# Patient Record
Sex: Male | Born: 1940 | Race: White | Hispanic: No | Marital: Single | Smoking: Former smoker
Health system: Southern US, Community
[De-identification: ages and names within clinical notes are randomized; demographics above are authoritative.]

## PROBLEM LIST (undated history)

## (undated) ENCOUNTER — Inpatient Hospital Stay: Admission: EM | Payer: Self-pay | Source: Home / Self Care

## (undated) DIAGNOSIS — N289 Disorder of kidney and ureter, unspecified: Secondary | ICD-10-CM

## (undated) DIAGNOSIS — E785 Hyperlipidemia, unspecified: Secondary | ICD-10-CM

## (undated) DIAGNOSIS — I639 Cerebral infarction, unspecified: Secondary | ICD-10-CM

## (undated) DIAGNOSIS — K759 Inflammatory liver disease, unspecified: Secondary | ICD-10-CM

## (undated) DIAGNOSIS — C73 Malignant neoplasm of thyroid gland: Secondary | ICD-10-CM

## (undated) DIAGNOSIS — I509 Heart failure, unspecified: Secondary | ICD-10-CM

## (undated) DIAGNOSIS — I739 Peripheral vascular disease, unspecified: Secondary | ICD-10-CM

## (undated) DIAGNOSIS — I499 Cardiac arrhythmia, unspecified: Secondary | ICD-10-CM

## (undated) DIAGNOSIS — I219 Acute myocardial infarction, unspecified: Secondary | ICD-10-CM

## (undated) DIAGNOSIS — C801 Malignant (primary) neoplasm, unspecified: Secondary | ICD-10-CM

## (undated) DIAGNOSIS — I10 Essential (primary) hypertension: Secondary | ICD-10-CM

## (undated) DIAGNOSIS — R400 Somnolence: Secondary | ICD-10-CM

## (undated) DIAGNOSIS — I2510 Atherosclerotic heart disease of native coronary artery without angina pectoris: Secondary | ICD-10-CM

## (undated) DIAGNOSIS — J449 Chronic obstructive pulmonary disease, unspecified: Secondary | ICD-10-CM

## (undated) DIAGNOSIS — R011 Cardiac murmur, unspecified: Secondary | ICD-10-CM

## (undated) HISTORY — DX: Acute myocardial infarction, unspecified: I21.9

## (undated) HISTORY — PX: CARDIAC CATHETERIZATION: SHX172

## (undated) HISTORY — DX: Peripheral vascular disease, unspecified: I73.9

## (undated) HISTORY — PX: HEMORRHOID SURGERY: SHX153

## (undated) HISTORY — DX: Somnolence: R40.0

## (undated) HISTORY — PX: TONSILLECTOMY: SUR1361

## (undated) HISTORY — DX: Hyperlipidemia, unspecified: E78.5

## (undated) HISTORY — DX: Cerebral infarction, unspecified: I63.9

## (undated) HISTORY — PX: CORONARY ANGIOPLASTY: SHX604

## (undated) HISTORY — DX: Malignant (primary) neoplasm, unspecified: C80.1

## (undated) HISTORY — DX: Heart failure, unspecified: I50.9

---

## 2002-06-22 DIAGNOSIS — I639 Cerebral infarction, unspecified: Secondary | ICD-10-CM

## 2002-06-22 HISTORY — DX: Cerebral infarction, unspecified: I63.9

## 2002-10-04 ENCOUNTER — Encounter: Payer: Self-pay | Admitting: Cardiology

## 2002-10-04 ENCOUNTER — Encounter: Payer: Self-pay | Admitting: Neurology

## 2002-10-04 ENCOUNTER — Encounter: Payer: Self-pay | Admitting: Emergency Medicine

## 2002-10-04 ENCOUNTER — Inpatient Hospital Stay (HOSPITAL_COMMUNITY): Admission: EM | Admit: 2002-10-04 | Discharge: 2002-10-06 | Payer: Self-pay | Admitting: Emergency Medicine

## 2002-10-18 ENCOUNTER — Encounter: Admission: RE | Admit: 2002-10-18 | Discharge: 2003-01-16 | Payer: Self-pay | Admitting: Neurology

## 2002-11-10 ENCOUNTER — Ambulatory Visit (HOSPITAL_COMMUNITY): Admission: RE | Admit: 2002-11-10 | Discharge: 2002-11-10 | Payer: Self-pay | Admitting: Neurology

## 2003-06-24 ENCOUNTER — Encounter: Payer: Self-pay | Admitting: Emergency Medicine

## 2003-06-24 ENCOUNTER — Inpatient Hospital Stay (HOSPITAL_COMMUNITY): Admission: RE | Admit: 2003-06-24 | Discharge: 2003-06-26 | Payer: Self-pay | Admitting: Internal Medicine

## 2003-11-13 ENCOUNTER — Encounter (INDEPENDENT_AMBULATORY_CARE_PROVIDER_SITE_OTHER): Payer: Self-pay | Admitting: *Deleted

## 2003-11-13 ENCOUNTER — Ambulatory Visit (HOSPITAL_BASED_OUTPATIENT_CLINIC_OR_DEPARTMENT_OTHER): Admission: RE | Admit: 2003-11-13 | Discharge: 2003-11-13 | Payer: Self-pay | Admitting: Plastic Surgery

## 2003-11-13 ENCOUNTER — Ambulatory Visit (HOSPITAL_COMMUNITY): Admission: RE | Admit: 2003-11-13 | Discharge: 2003-11-13 | Payer: Self-pay | Admitting: Plastic Surgery

## 2003-11-26 ENCOUNTER — Emergency Department (HOSPITAL_COMMUNITY): Admission: EM | Admit: 2003-11-26 | Discharge: 2003-11-26 | Payer: Self-pay | Admitting: Emergency Medicine

## 2006-11-23 ENCOUNTER — Ambulatory Visit (HOSPITAL_COMMUNITY): Admission: RE | Admit: 2006-11-23 | Discharge: 2006-11-24 | Payer: Self-pay | Admitting: Orthopedic Surgery

## 2006-11-23 HISTORY — PX: SPINE SURGERY: SHX786

## 2007-01-19 ENCOUNTER — Ambulatory Visit: Payer: Self-pay | Admitting: Vascular Surgery

## 2007-01-24 ENCOUNTER — Ambulatory Visit: Payer: Self-pay | Admitting: Vascular Surgery

## 2007-01-24 ENCOUNTER — Ambulatory Visit (HOSPITAL_COMMUNITY): Admission: RE | Admit: 2007-01-24 | Discharge: 2007-01-24 | Payer: Self-pay | Admitting: Vascular Surgery

## 2007-02-08 ENCOUNTER — Ambulatory Visit: Payer: Self-pay | Admitting: Vascular Surgery

## 2007-08-09 ENCOUNTER — Ambulatory Visit: Payer: Self-pay | Admitting: Vascular Surgery

## 2008-05-08 ENCOUNTER — Ambulatory Visit: Payer: Self-pay | Admitting: Vascular Surgery

## 2009-05-10 ENCOUNTER — Ambulatory Visit (HOSPITAL_COMMUNITY): Admission: RE | Admit: 2009-05-10 | Discharge: 2009-05-10 | Payer: Self-pay | Admitting: Cardiovascular Disease

## 2009-05-23 ENCOUNTER — Ambulatory Visit: Payer: Self-pay | Admitting: Vascular Surgery

## 2009-07-11 ENCOUNTER — Ambulatory Visit: Payer: Self-pay | Admitting: Vascular Surgery

## 2009-11-26 ENCOUNTER — Ambulatory Visit: Payer: Self-pay | Admitting: Vascular Surgery

## 2009-12-12 ENCOUNTER — Ambulatory Visit: Payer: Self-pay | Admitting: Vascular Surgery

## 2010-02-06 ENCOUNTER — Encounter: Admission: RE | Admit: 2010-02-06 | Discharge: 2010-02-06 | Payer: Self-pay | Admitting: Family Medicine

## 2010-06-04 ENCOUNTER — Ambulatory Visit: Payer: Self-pay | Admitting: Vascular Surgery

## 2010-07-07 ENCOUNTER — Ambulatory Visit (HOSPITAL_COMMUNITY)
Admission: RE | Admit: 2010-07-07 | Discharge: 2010-07-07 | Payer: Self-pay | Source: Home / Self Care | Attending: Vascular Surgery | Admitting: Vascular Surgery

## 2010-07-09 LAB — POCT I-STAT, CHEM 8
BUN: 11 mg/dL (ref 6–23)
Calcium, Ion: 1.02 mmol/L — ABNORMAL LOW (ref 1.12–1.32)
Chloride: 99 mEq/L (ref 96–112)
Creatinine, Ser: 1.1 mg/dL (ref 0.4–1.5)
Glucose, Bld: 91 mg/dL (ref 70–99)
HCT: 49 % (ref 39.0–52.0)
Hemoglobin: 16.7 g/dL (ref 13.0–17.0)
Potassium: 4 mEq/L (ref 3.5–5.1)
Sodium: 131 mEq/L — ABNORMAL LOW (ref 135–145)
TCO2: 26 mmol/L (ref 0–100)

## 2010-07-12 ENCOUNTER — Other Ambulatory Visit: Payer: Self-pay | Admitting: Vascular Surgery

## 2010-07-12 DIAGNOSIS — I7409 Other arterial embolism and thrombosis of abdominal aorta: Secondary | ICD-10-CM

## 2010-07-23 ENCOUNTER — Encounter (HOSPITAL_COMMUNITY): Payer: Medicare Other

## 2010-07-23 ENCOUNTER — Encounter (HOSPITAL_COMMUNITY)
Admission: RE | Admit: 2010-07-23 | Discharge: 2010-07-23 | Disposition: A | Discharge status: Home / Self Care | Payer: Medicare Other | Source: Ambulatory Visit | Attending: Vascular Surgery | Admitting: Vascular Surgery

## 2010-07-23 ENCOUNTER — Ambulatory Visit (INDEPENDENT_AMBULATORY_CARE_PROVIDER_SITE_OTHER): Payer: Medicare Other | Admitting: Vascular Surgery

## 2010-07-23 ENCOUNTER — Ambulatory Visit
Admission: RE | Admit: 2010-07-23 | Discharge: 2010-07-23 | Disposition: A | Discharge status: Home / Self Care | Payer: Medicare Other | Source: Ambulatory Visit | Attending: Vascular Surgery | Admitting: Vascular Surgery

## 2010-07-23 ENCOUNTER — Other Ambulatory Visit: Payer: Self-pay | Admitting: Vascular Surgery

## 2010-07-23 ENCOUNTER — Ambulatory Visit: Admit: 2010-07-23 | Discharge: 2010-07-23 | Payer: Self-pay | Attending: Vascular Surgery | Admitting: Vascular Surgery

## 2010-07-23 DIAGNOSIS — I7409 Other arterial embolism and thrombosis of abdominal aorta: Secondary | ICD-10-CM

## 2010-07-23 DIAGNOSIS — I739 Peripheral vascular disease, unspecified: Secondary | ICD-10-CM

## 2010-07-23 DIAGNOSIS — Z01818 Encounter for other preprocedural examination: Secondary | ICD-10-CM | POA: Insufficient documentation

## 2010-07-23 DIAGNOSIS — I70219 Atherosclerosis of native arteries of extremities with intermittent claudication, unspecified extremity: Secondary | ICD-10-CM

## 2010-07-23 HISTORY — PX: BYPASS GRAFT: SHX909

## 2010-07-23 LAB — COMPREHENSIVE METABOLIC PANEL
ALT: 16 U/L (ref 0–53)
AST: 22 U/L (ref 0–37)
Albumin: 3.9 g/dL (ref 3.5–5.2)
Alkaline Phosphatase: 73 U/L (ref 39–117)
BUN: 5 mg/dL — ABNORMAL LOW (ref 6–23)
CO2: 28 mEq/L (ref 19–32)
Calcium: 9.1 mg/dL (ref 8.4–10.5)
Chloride: 93 mEq/L — ABNORMAL LOW (ref 96–112)
Creatinine, Ser: 0.73 mg/dL (ref 0.4–1.5)
GFR calc Af Amer: >60 mL/min (ref >60)
GFR calc non Af Amer: >60 mL/min (ref >60)
Glucose, Bld: 87 mg/dL (ref 70–99)
Potassium: 4.1 mEq/L (ref 3.5–5.1)
Sodium: 128 mEq/L — ABNORMAL LOW (ref 135–145)
Total Bilirubin: 0.6 mg/dL (ref 0.3–1.2)
Total Protein: 7.1 g/dL (ref 6.0–8.3)

## 2010-07-23 LAB — SURGICAL PCR SCREEN
MRSA, PCR: NEGATIVE
Staphylococcus aureus: NEGATIVE

## 2010-07-23 LAB — BLOOD GAS, ARTERIAL
Acid-Base Excess: 2.8 mmol/L — ABNORMAL HIGH (ref 0.0–2.0)
Bicarbonate: 26.6 mEq/L — ABNORMAL HIGH (ref 20.0–24.0)
Drawn by: 289361
FIO2: 0.21 %
O2 Saturation: 95.3 %
Patient temperature: 98.6
TCO2: 27.8 mmol/L (ref 0–100)
pCO2 arterial: 39.3 mmHg (ref 35.0–45.0)
pH, Arterial: 7.445 (ref 7.350–7.450)
pO2, Arterial: 71.9 mmHg — ABNORMAL LOW (ref 80.0–100.0)

## 2010-07-23 LAB — URINALYSIS, ROUTINE W REFLEX MICROSCOPIC
Bilirubin Urine: NEGATIVE
Ketones, ur: NEGATIVE mg/dL
Leukocytes, UA: NEGATIVE
Nitrite: NEGATIVE
Protein, ur: NEGATIVE mg/dL
Specific Gravity, Urine: 1.011 (ref 1.005–1.030)
Urine Glucose, Fasting: NEGATIVE mg/dL
Urobilinogen, UA: 0.2 mg/dL (ref 0.0–1.0)
pH: 7.5 (ref 5.0–8.0)

## 2010-07-23 LAB — URINE MICROSCOPIC-ADD ON

## 2010-07-23 LAB — CBC
HCT: 43.8 % (ref 39.0–52.0)
Hemoglobin: 15.2 g/dL (ref 13.0–17.0)
MCH: 32.5 pg (ref 26.0–34.0)
MCHC: 34.7 g/dL (ref 30.0–36.0)
MCV: 93.6 fL (ref 78.0–100.0)
Platelets: 300 10*3/uL (ref 150–400)
RBC: 4.68 MIL/uL (ref 4.22–5.81)
RDW: 13.3 % (ref 11.5–15.5)
WBC: 10.8 10*3/uL — ABNORMAL HIGH (ref 4.0–10.5)

## 2010-07-23 LAB — PROTIME-INR
INR: 1.11 (ref 0.00–1.49)
Prothrombin Time: 14.5 seconds (ref 11.6–15.2)

## 2010-07-23 LAB — ABO/RH: ABO/RH(D): O POS

## 2010-07-23 LAB — APTT: aPTT: 35 seconds (ref 24–37)

## 2010-07-24 ENCOUNTER — Other Ambulatory Visit: Payer: Self-pay | Admitting: Vascular Surgery

## 2010-07-24 ENCOUNTER — Inpatient Hospital Stay (HOSPITAL_COMMUNITY): Payer: Medicare Other

## 2010-07-24 ENCOUNTER — Inpatient Hospital Stay (HOSPITAL_COMMUNITY)
Admission: RE | Admit: 2010-07-24 | Discharge: 2010-08-01 | DRG: 253 | Disposition: A | Discharge status: Home Health | Payer: Medicare Other | Attending: Vascular Surgery | Admitting: Vascular Surgery

## 2010-07-24 DIAGNOSIS — E871 Hypo-osmolality and hyponatremia: Secondary | ICD-10-CM | POA: Diagnosis not present

## 2010-07-24 DIAGNOSIS — J90 Pleural effusion, not elsewhere classified: Secondary | ICD-10-CM | POA: Diagnosis not present

## 2010-07-24 DIAGNOSIS — D72829 Elevated white blood cell count, unspecified: Secondary | ICD-10-CM | POA: Diagnosis present

## 2010-07-24 DIAGNOSIS — K560 Paralytic ileus: Secondary | ICD-10-CM | POA: Diagnosis not present

## 2010-07-24 DIAGNOSIS — L03818 Cellulitis of other sites: Secondary | ICD-10-CM | POA: Diagnosis not present

## 2010-07-24 DIAGNOSIS — I7409 Other arterial embolism and thrombosis of abdominal aorta: Principal | ICD-10-CM | POA: Diagnosis present

## 2010-07-24 DIAGNOSIS — E785 Hyperlipidemia, unspecified: Secondary | ICD-10-CM | POA: Diagnosis present

## 2010-07-24 DIAGNOSIS — I7092 Chronic total occlusion of artery of the extremities: Secondary | ICD-10-CM

## 2010-07-24 DIAGNOSIS — F17200 Nicotine dependence, unspecified, uncomplicated: Secondary | ICD-10-CM | POA: Diagnosis present

## 2010-07-24 DIAGNOSIS — T8140XA Infection following a procedure, unspecified, initial encounter: Secondary | ICD-10-CM | POA: Diagnosis not present

## 2010-07-24 DIAGNOSIS — Y849 Medical procedure, unspecified as the cause of abnormal reaction of the patient, or of later complication, without mention of misadventure at the time of the procedure: Secondary | ICD-10-CM | POA: Diagnosis not present

## 2010-07-24 DIAGNOSIS — L02818 Cutaneous abscess of other sites: Secondary | ICD-10-CM | POA: Diagnosis not present

## 2010-07-24 DIAGNOSIS — I10 Essential (primary) hypertension: Secondary | ICD-10-CM | POA: Diagnosis present

## 2010-07-24 LAB — CBC
HCT: 36.8 % — ABNORMAL LOW (ref 39.0–52.0)
Hemoglobin: 12.2 g/dL — ABNORMAL LOW (ref 13.0–17.0)
MCH: 31.3 pg (ref 26.0–34.0)
MCHC: 33.2 g/dL (ref 30.0–36.0)
MCV: 94.4 fL (ref 78.0–100.0)
Platelets: 228 10*3/uL (ref 150–400)
RBC: 3.9 MIL/uL — ABNORMAL LOW (ref 4.22–5.81)
RDW: 13.4 % (ref 11.5–15.5)
WBC: 18.7 10*3/uL — ABNORMAL HIGH (ref 4.0–10.5)

## 2010-07-24 LAB — PROTIME-INR
INR: 1.31 (ref 0.00–1.49)
Prothrombin Time: 16.5 seconds — ABNORMAL HIGH (ref 11.6–15.2)

## 2010-07-24 LAB — BASIC METABOLIC PANEL
BUN: 5 mg/dL — ABNORMAL LOW (ref 6–23)
CO2: 26 mEq/L (ref 19–32)
Chloride: 100 mEq/L (ref 96–112)
GFR calc non Af Amer: >60 mL/min (ref >60)
Glucose, Bld: 150 mg/dL — ABNORMAL HIGH (ref 70–99)
Potassium: 4.1 mEq/L (ref 3.5–5.1)
Sodium: 134 mEq/L — ABNORMAL LOW (ref 135–145)

## 2010-07-24 NOTE — Assessment & Plan Note (Signed)
OFFICE VISIT  [name] [name], [name] [name] DOB:  [birth year]                                       07/23/2010 NUUVO#:[id number]  This patient is being admitted tomorrow for aortofemoral bypass grafting.  I have dictated his history and physical on the hospital line.    Di Kindle. Edilia Bo, M.D. Electronically Signed  CSD/MEDQ  D:  07/23/2010  T:  07/24/2010  Job:  [id number]

## 2010-07-25 ENCOUNTER — Inpatient Hospital Stay (HOSPITAL_COMMUNITY): Payer: Medicare Other

## 2010-07-25 LAB — COMPREHENSIVE METABOLIC PANEL
ALT: 13 U/L (ref 0–53)
BUN: 3 mg/dL — ABNORMAL LOW (ref 6–23)
CO2: 27 mEq/L (ref 19–32)
Calcium: 7.9 mg/dL — ABNORMAL LOW (ref 8.4–10.5)
GFR calc non Af Amer: >60 mL/min (ref >60)
Glucose, Bld: 137 mg/dL — ABNORMAL HIGH (ref 70–99)
Sodium: 132 mEq/L — ABNORMAL LOW (ref 135–145)

## 2010-07-25 LAB — CBC
HCT: 38.6 % — ABNORMAL LOW (ref 39.0–52.0)
Hemoglobin: 13.2 g/dL (ref 13.0–17.0)
MCH: 32.4 pg (ref 26.0–34.0)
MCHC: 34.2 g/dL (ref 30.0–36.0)
MCV: 94.6 fL (ref 78.0–100.0)

## 2010-07-26 LAB — TYPE AND SCREEN
ABO/RH(D): O POS
Antibody Screen: NEGATIVE
Unit division: 0
Unit division: 0

## 2010-07-26 LAB — URINALYSIS, ROUTINE W REFLEX MICROSCOPIC
Leukocytes, UA: NEGATIVE
Nitrite: NEGATIVE
Specific Gravity, Urine: 1.016 (ref 1.005–1.030)
pH: 6.5 (ref 5.0–8.0)

## 2010-07-26 LAB — CBC
HCT: 37.5 % — ABNORMAL LOW (ref 39.0–52.0)
Platelets: 204 10*3/uL (ref 150–400)
RBC: 4.03 MIL/uL — ABNORMAL LOW (ref 4.22–5.81)
RDW: 13.4 % (ref 11.5–15.5)
WBC: 25.6 10*3/uL — ABNORMAL HIGH (ref 4.0–10.5)

## 2010-07-26 LAB — BASIC METABOLIC PANEL
GFR calc non Af Amer: >60 mL/min (ref >60)
Glucose, Bld: 172 mg/dL — ABNORMAL HIGH (ref 70–99)
Potassium: 3.6 mEq/L (ref 3.5–5.1)
Sodium: 133 mEq/L — ABNORMAL LOW (ref 135–145)

## 2010-07-26 LAB — URINE MICROSCOPIC-ADD ON

## 2010-07-27 ENCOUNTER — Inpatient Hospital Stay (HOSPITAL_COMMUNITY): Payer: Medicare Other

## 2010-07-27 LAB — BASIC METABOLIC PANEL
Calcium: 7.9 mg/dL — ABNORMAL LOW (ref 8.4–10.5)
Creatinine, Ser: 0.58 mg/dL (ref 0.4–1.5)
GFR calc Af Amer: >60 mL/min (ref >60)

## 2010-07-27 LAB — CBC
MCH: 31.3 pg (ref 26.0–34.0)
Platelets: 197 10*3/uL (ref 150–400)
RBC: 3.77 MIL/uL — ABNORMAL LOW (ref 4.22–5.81)
WBC: 23.7 10*3/uL — ABNORMAL HIGH (ref 4.0–10.5)

## 2010-07-28 LAB — BASIC METABOLIC PANEL
GFR calc non Af Amer: >60 mL/min (ref >60)
Glucose, Bld: 113 mg/dL — ABNORMAL HIGH (ref 70–99)
Potassium: 3.2 mEq/L — ABNORMAL LOW (ref 3.5–5.1)
Sodium: 133 mEq/L — ABNORMAL LOW (ref 135–145)

## 2010-07-28 LAB — URINE CULTURE
Colony Count: NO GROWTH
Culture  Setup Time: 201202050110

## 2010-07-28 LAB — CBC
HCT: 31.7 % — ABNORMAL LOW (ref 39.0–52.0)
Hemoglobin: 10.7 g/dL — ABNORMAL LOW (ref 13.0–17.0)
RDW: 13.4 % (ref 11.5–15.5)
WBC: 16.5 10*3/uL — ABNORMAL HIGH (ref 4.0–10.5)

## 2010-07-29 ENCOUNTER — Inpatient Hospital Stay (HOSPITAL_COMMUNITY): Payer: Medicare Other

## 2010-07-29 LAB — BASIC METABOLIC PANEL
BUN: 5 mg/dL — ABNORMAL LOW (ref 6–23)
Calcium: 7.9 mg/dL — ABNORMAL LOW (ref 8.4–10.5)
Creatinine, Ser: 0.63 mg/dL (ref 0.4–1.5)
GFR calc non Af Amer: >60 mL/min (ref >60)
Potassium: 3.7 mEq/L (ref 3.5–5.1)

## 2010-07-29 LAB — CBC
MCH: 31.5 pg (ref 26.0–34.0)
MCHC: 33.7 g/dL (ref 30.0–36.0)
MCV: 93.5 fL (ref 78.0–100.0)
Platelets: 196 10*3/uL (ref 150–400)
RDW: 13.1 % (ref 11.5–15.5)
WBC: 12.3 10*3/uL — ABNORMAL HIGH (ref 4.0–10.5)

## 2010-07-30 LAB — BASIC METABOLIC PANEL
Calcium: 8.3 mg/dL — ABNORMAL LOW (ref 8.4–10.5)
GFR calc Af Amer: >60 mL/min (ref >60)
GFR calc non Af Amer: >60 mL/min (ref >60)
Potassium: 3.6 mEq/L (ref 3.5–5.1)
Sodium: 138 mEq/L (ref 135–145)

## 2010-07-30 LAB — CBC
MCHC: 33.7 g/dL (ref 30.0–36.0)
Platelets: 196 10*3/uL (ref 150–400)
RDW: 12.9 % (ref 11.5–15.5)
WBC: 12.6 10*3/uL — ABNORMAL HIGH (ref 4.0–10.5)

## 2010-07-31 LAB — BASIC METABOLIC PANEL
CO2: 28 mEq/L (ref 19–32)
GFR calc Af Amer: >60 mL/min (ref >60)
GFR calc non Af Amer: >60 mL/min (ref >60)
Glucose, Bld: 98 mg/dL (ref 70–99)
Potassium: 3.7 mEq/L (ref 3.5–5.1)
Sodium: 134 mEq/L — ABNORMAL LOW (ref 135–145)

## 2010-07-31 LAB — CBC
HCT: 34.6 % — ABNORMAL LOW (ref 39.0–52.0)
Hemoglobin: 11.8 g/dL — ABNORMAL LOW (ref 13.0–17.0)
WBC: 12.6 10*3/uL — ABNORMAL HIGH (ref 4.0–10.5)

## 2010-08-01 LAB — CBC
HCT: 34.9 % — ABNORMAL LOW (ref 39.0–52.0)
Hemoglobin: 11.8 g/dL — ABNORMAL LOW (ref 13.0–17.0)
MCHC: 33.8 g/dL (ref 30.0–36.0)
RBC: 3.77 MIL/uL — ABNORMAL LOW (ref 4.22–5.81)

## 2010-08-04 ENCOUNTER — Other Ambulatory Visit (HOSPITAL_COMMUNITY): Payer: Self-pay | Admitting: Cardiovascular Disease

## 2010-08-04 DIAGNOSIS — I6529 Occlusion and stenosis of unspecified carotid artery: Secondary | ICD-10-CM

## 2010-08-05 NOTE — H&P (Addendum)
NAME:  [name] [name], [name] [name] NO.:  [id number]  MEDICAL RECORD NO.:  [id number]           PATIENT TYPE:  LOCATION:                               FACILITY:  [facility]  PHYSICIAN:  Di Kindle. 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  HISTORY:  This is a pleasant 70 year old gentleman who I have been following some time with peripheral vascular disease.  He has bilateral lower extremity claudication, which has gradually been progressive over several years.  His most recent ABIs in December 2011 showed an ABI 40% on the right and 59% on the left.  He has undergone arteriography, which shows occluded common and external iliac artery on the right with an occluded external iliac artery on the left.  He was not a candidate for endovascular intervention.  Given the progression of his symptoms which have now become disabling, he wished to pursue revascularization and he is admitted for aortofemoral bypass grafting.  He has claudication in both legs that involves his calves and thighs bilaterally.  The symptoms are brought on by ambulation and relieved with rest.  This occurs at less than half a block.  His symptoms are more significant on the right side.  He has also developed some rest pain in his right foot.  He has no history of nonhealing ulcers.  PAST MEDICAL HISTORY:  Significant for hypertension and hypercholesterolemia.  He denies any history of diabetes, history of previous myocardial infarction, history of congestive heart failure or history of COPD.  SOCIAL HISTORY:  He is single.  He is two children.  He smokes a pack per day of cigarettes and has been smoking since he was 70 years old.  ALLERGIES:  The patient is allergic to CECLOR.  MEDICATIONS: 1. Amlodipine 10  mg p.o. daily. 2. Atenolol 50 mg p.o. daily. 3. Gabapentin 600 mg p.o. b.i.d. 4. Pletal 100 mg p.o. b.i.d. 5. Trazodone 150 mg half a tab p.r.n. 6. Vitamin C over-the-counter daily. 7. Crestor 5 mg twice weekly. 8. Losartan 50 mg two daily p.o. 9. Hydralazine 25 mg p.o. t.i.d. 10.Potassium p.o. daily.  REVIEW OF SYSTEMS:  GENERAL:  He has had no recent weight loss, weight gain or problems with his appetite.  CARDIOVASCULAR:  He has had no chest pain, chest pressure, palpitations, or arrhythmias.  He has had a mini-stroke in the past, but cannot remember the details.  He has had no history of DVT or phlebitis.  GI:  He has had occasional diarrhea. NEUROLOGIC:  He had no dizziness, blackouts, headaches, or seizures. PULMONARY:  He has had no productive cough, bronchitis, asthma, or wheezing.  GU:  He has some urinary frequency at times.  ENT:  He has had some gradual change in his eyesight and hearing.  Hematologic, musculoskeletal, psychiatric, integumentary review of systems is unremarkable.  PHYSICAL EXAMINATION:  GENERAL:  This is a pleasant 70 year old gentleman who appears his stated age. VITAL SIGNS:  Blood pressure is 130/63, heart  rate is 80, temperature is 97.9. HEENT:  Unremarkable. LUNGS:  Clear bilaterally to auscultation without rales, rhonchi, or wheezing. CARDIOVASCULAR:  He has a regular rate and rhythm.  I cannot palpate popliteal or pedal pulses on either side. ABDOMEN:  Soft and nontender with normal-pitched bowel sounds. MUSCULOSKELETAL:  There is no major deformities or cyanosis. NEUROLOGIC:  He has no focal weakness or paresthesias. SKIN:  There are no ulcers or rashes.  I have reviewed his arteriogram, which shows an occlusion of the right common and external iliac arteries with reconstitution at the femoral bifurcation.  On the left side, he has an occluded external iliac artery.  He has a markedly calcific aorta with a large IMA that is patent.  There  is a calcific plaque of the suprarenal, which does not appear to be flow-limiting.  I have also obtain the CT scan which shows a markedly calcified aorta throughout.  His most recent ABIs showed ABI 40% on the right and 59% on the left.  I have had a long discussion with the patient about the options for revascularization.  I have explained that I think as long as we can safely clamp his aorta, aortobifemoral bypass grafting would be the best approach in terms of long-term patency and relief of symptoms.  I have explained, however, given the markedly calcific aorta if I feel that it is not safe to occlude the aorta, we potentially have to convert to an axillofemoral bypass graft.  Of note, he did have an arch aortogram, which showed the left subclavian is widely patent.  We have discussed the indications for surgery and the potential complications including, but not limited to bleeding, injury to the aorta from the diffuse calcific disease, renal insufficiency, wound healing problems, graft infection, MI, or other unpredictable medical problems.  All of his questions were answered and he is agreeable to proceed.  Of note, we have also discussed the importance of tobacco cessation.  In addition, he has been evaluated by Dr. Allyson Sabal and is felt to be an acceptable cardiovascular risk for surgery.  According to Dr. Hazle Coca note, he had a negative Myoview.  Of note, Dr. Allyson Sabal been following his carotid arteries.     Di Kindle. Edilia Bo, M.D.     CSD/MEDQ  D:  07/23/2010  T:  07/24/2010  Job:  [id number]  cc:   Nanetta Batty, M.D. Windle Guard, M.D.  Electronically Signed by Waverly Ferrari M.D. on 08/19/2010 05:51:21 PM

## 2010-08-05 NOTE — Op Note (Signed)
NAME:  [name] [name], [name] [name] NO.:  [id number]  MEDICAL RECORD NO.:  [id number]           PATIENT TYPE:  I  LOCATION:  2308                         FACILITY:  [facility]  PHYSICIAN:  Di Kindle. 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  PROCEDURES: 1. Exploration of the infrarenal aorta. 2. Left axillobifemoral bypass graft.  SURGEON:  Di Kindle. Edilia Bo, MD  ASSISTANTS: 1. Fransisco Hertz, MD 2. Mont Dutton, RNFA.  ANESTHESIA:  General.  FINDINGS: 1. Porcelain aorta. 2. Diffuse multilevel arterial occlusive disease.  INDICATIONS:  This is a 70 year old gentleman who I have been following with multilevel arterial occlusive disease, mostly diffuse aortoiliac occlusive disease.  He had an occlusion of the right common and external iliac arteries on the right and occlusion of the left external iliac artery on the left.  He also had significant disease of the common femoral arteries bilaterally and infrainguinal arterial occlusive disease.  He was not a candidate for an endovascular approach.  His symptoms progressed and he was developing rest pain in the right foot. Given the progression of his symptoms, he wished to pursue revascularization.  Of note, I was concerned about the extent of calcific disease in his aorta which was confirmed with CT scan.  He had circumferential calcific disease.  He also had a large eccentric calcific plaque just above the left renal artery, although by angio this did not appear to be flow-limiting.  My plan was aortofemoral bypass grafting assuming we could clamp the aorta and did discuss with family preoperatively that if this was not  technically possible, then the our next option would be left axillobifemoral bypass graft.  I had performed an arteriogram that showed a left subclavian artery was widely patent.  TECHNIQUE:  The patient was taken to the operating room and received a general anesthetic.  The abdomen, lower groins, left infraclavicular area, and left chest were prepped and draped in the usual sterile fashion.  I had prepped for potential axillofemoral bypass grafting if in fact the aorta could not be clamped.  The right groin was exposed through a longitudinal incision and the common femoral, superficial femoral, and deep femoral arteries were controlled.  Of note, the deep femoral artery could be clamped distally where it was soft.  The superficial femoral artery was soft enough to clamp.  There was diffuse calcific disease proximally, but one area where it appears that I could get a clamp for proximal control.  Multiple branches were also controlled with red loops.  On the left side, a longitudinal incision was made and the common femoral, superficial femoral, and deep femoral arteries were controlled.  The superficial femoral artery was diffusely calcified and could not be clamped.  The deep femoral artery was soft enough that it could be clamped and again fairly high up on the external iliac artery that was an area that I could safely  clamp for proximal control on the left.  Multiple branches also were controlled with red vessel loops.  Next, the abdomen was entered through a midline incision. The transverse colon was reflected superiorly and the small bowel reflected to the right.  The retroperitoneal tissue was divided and the infrarenal aorta exposed from the level of the renal vein down to the large patent inferior mesenteric artery.  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 There was no area of the aorta below the renals that could even technically be clamped.  In addition, the suprarenal clamp was not  an option given the large eccentric plaque located just above the renals seen on arteriogram and also on CT scan.  For this reason, it was not technically feasible to clamp the aorta and therefore we elected to proceed with the axillofemoral bypass grafting.  The retroperitoneal tissue was closed with running 2-0 Vicryl.  The abdominal contents were returned in the normal position.  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  The fascial layer was closed with two #1 PDS sutures. The skin was closed with 4-0 subcuticular stitch.  Dermabond was applied.  Next, attention was turned to exposure of the left axillary artery.  An infraclavicular incision was made on the left.  The two heads of pec major were divided and the clavipectoral fascia divided. The artery was then mobilized superior to the vein, it was a soft artery and had excellent pulse and this was controlled for proximal anastomosis.  Next, an 8-mm PTFE Intering graft was tunneled from the groin incision to the axillary incision.  In an inverted U fashion, another graft was tunneled from the right groin to the left groin.  This was an 8-mm Intering Gore-Tex graft.  The patient was then heparinized. Attention was first turned to the right femoral anastomosis.  The deep femoral artery and superficial femoral artery were clamped.  An external iliac artery was clamped proximally and then a longitudinal arteriotomy made in one small area that had a soft spot along the anterior aspect of the common femoral artery.  It otherwise was diffuse calcific disease. The right limb of the graft for the fem-fem graft was spatulated and sewn end-to-side to the common femoral artery using continuous 6-0 Prolene suture.  Prior to completing this anastomosis, the graft was clamped, the arteries were back-bled and flushed, and the anastomosis completed.  The graft was then  reported to be appropriate length for anastomosis to the left femoral artery.  The deep femoral artery was clamped distally.  The external iliac artery was clamped proximally. The superficial femoral artery could not be clamped and therefore I elected to control this with a Fogarty catheter.  A longitudinal arteriotomy was made in the common femoral artery along the anterior aspect fairly high up on the common femoral artery as this was the only soft spot.  The left limb of the fem-fem graft was then spatulated, cut to appropriate length, and sewn end-to-side to the left common femoral artery using continuous 6-0 suture.  These arteries were also back-bled and flushed before completing the anastomosis.  The clamps were then released.  Next, attention was turned to the axilla-fem graft.  The axillary artery was clamped proximally and distally and a longitudinal arteriotomy was made.  The 8-mm PTFE graft was spatulated.  A longitudinal arteriotomy was made in the axillary artery and the graft sewn end-to-side to the axillary artery using a running 5-0  PTFE suture. Of note, I did divide the pec minor and the graft was tunneled deep to this level.  An effort slack was left in the graft to form the mobility of the arm.  The graft was then reported to be appropriate length for anastomosis to the fem-fem graft.  The fem-fem graft was clamped proximally and distally and then a longitudinal graftotomy made in the fem-fem graft.  The axillofemoral graft was then sewn end-to-side to the fem-fem graft using continuous 6-0 PTFE suture.  At the completion, there was excellent flow in the deep femoral and superficial femoral artery bilaterally which was graft dependent.  Hemostasis was obtained in the wounds.  The heparin was partially reversed with protamine.  The infraclavicular incision on the left was closed with a deep layer of 3-0 Vicryl.  The skin was closed with a 4-0 subcuticular stitch.   Dermabond was applied.  Hemostasis was obtained in both groins and both groins were closed with a deep layer of 2-0 Vicryl.  Subcutaneous layer of 2-0 Vicryl.  Skin closed with a 4-0 subcuticular stitch. Sterile dressing was applied.  The patient tolerated the procedure well and the patient had good posterior tibial signal with Doppler postoperatively.  All needle and sponge counts were correct.     Di Kindle. Edilia Bo, M.D.     CSD/MEDQ  D:  07/24/2010  T:  07/25/2010  Job:  [id number]  cc:   Windle Guard, M.D. Nanetta Batty, M.D.  Electronically Signed by Waverly Ferrari M.D. on 07/30/2010 02:09:45 PM

## 2010-08-11 NOTE — Discharge Summary (Signed)
NAME:  [name] [name], [name] [name] NO.:  [id number]  MEDICAL RECORD NO.:  [id number]           PATIENT TYPE:  I  LOCATION:  2038                         FACILITY:  [facility]  PHYSICIAN:  Di Kindle. Edilia Bo, M.D.DATE OF BIRTH:  04/25/1941  DATE OF ADMISSION:  07/24/2010 DATE OF DISCHARGE:                              DISCHARGE SUMMARY   ADMIT DIAGNOSIS:  Aortoiliac occlusive disease.  PAST MEDICAL HISTORY: 1. 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 2. Tobacco abuse. 3. Hypertension. 4. Hyperlipidemia. 5. History of diverticulitis. 6. Postoperative mild ileus, resolved. 7. Postoperative cellulitis, resolved.  DISCHARGE DIAGNOSES: 1. 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 2. Tobacco abuse. 3. Hypertension. 4. Hyperlipidemia. 5. History of diverticulitis. 6. Postoperative mild ileus, resolved. 7. Postoperative cellulitis, resolved.  BRIEF HISTORY:  The patient is a 70 year old Caucasian male who has been followed by Dr. Edilia Bo for multilevel arterial occlusive disease, most diffusely aortoiliac occlusive disease.  The patient was not a candidate for endovascular approach secondary to the significance of his disease. The patient's symptoms progressed and he was developing rest pain in the right foot.  Given progression of symptoms, the patient wished to proceed with revascularization.  Dr. Edilia Bo had been concerned about the extent of the of disease in his aorta, therefore, the patient was prepared for the fact that he may have an aortobifemoral bypass or an axillobifemoral bypass grafting depending on the findings intraoperatively.  HOSPITAL COURSE:  The patient was admitted and taken to the OR on July 24, 2010, for exploration of the infrarenal aorta and a left axillobifemoral bypass graft.  The patient tolerated the procedure well and was stable immediately postoperatively.   The patient was transferred from the OR to the postanesthesia care unit in stable condition.  The patient was extubated without complication and woke up from anesthesia neurologically intact.  The patient's postoperative course has progressed as expected with the exception of a mild incisional cellulitis that has been treated successfully with Ancef.  He also did have a mild postoperative ileus which is also resolving.  The patient is ambulating well, and his diet has been progressively advanced. He has remained afebrile with stable vital signs throughout the postoperative course.  The patient had a leukocytosis noted on postoperative day #3, therefore chest x-ray was ordered which revealed a small left pleural effusion, pneumoperitoneum, and a possible ileus as previously stated.  The patient's leukocytosis has continued to improve while on Ancef.  On July 29, 2010, postop day #5, the patient is afebrile with stable vital signs.  He feels well and without complaints. He has had a bowel movement and denies nausea or vomiting.  He is tolerating full diet at this point.  On physical exam, the abdomen is soft with active bowel sounds.  The incisions are healing well, and the bilateral lower extremities are warm.  The patient is instructed to continue to ambulate and his diet will be advanced to a regular diet today.  As long as the patient continues to progress in current manner, he will be ready for  discharge tomorrow morning.  LABORATORY DATA:  CBC and BMP on July 29, 2010, white count 12.3, hemoglobin 11.6, hematocrit 34.4, platelets 196,000.  Sodium 134, potassium 3.7, BUN 5, creatinine 0.6.  The patient received written discharge instructions concerning diet, activity, and wound care.  He will follow up with Dr. Edilia Bo in the office in approximately 2 weeks.  The patient will be contacted with the date and time of that appointment.  DISCHARGE MEDICATIONS: 1. Nicotine patch  14 mg q.24 h. Transdermally. 2. Oxycodone/acetaminophen 5/325 mg 1-2 q.4-6 h. p.r.n. pain. 3. Amlodipine 10 mg t.i.d. 4. Atenolol 100 mg daily. 5. Cilostazol 100 mg b.i.d. 6. Crestor 5 mg every other day. 7. Gabapentin 600 mg b.i.d. 8. Hydralazine 25 mg t.i.d. 9. Losartan 50 mg b.i.d. 10.Potassium gluconate 595 mg daily p.r.n. 11.Trazodone 150 mg at bedtime p.r.n. 12.Vitamin C 1000 mg OTC 1 tablet daily.     Pecola Leisure, PA   ______________________________ Di Kindle. Edilia Bo, M.D.    AY/MEDQ  D:  07/29/2010  T:  07/29/2010  Job:  [id number]  Electronically Signed by Pecola Leisure PA on 08/06/2010 02:19:39 PM Electronically Signed by Waverly Ferrari M.D. on 08/11/2010 07:54:01 AM

## 2010-08-20 ENCOUNTER — Ambulatory Visit (INDEPENDENT_AMBULATORY_CARE_PROVIDER_SITE_OTHER): Payer: Medicare Other | Admitting: Vascular Surgery

## 2010-08-20 DIAGNOSIS — I70219 Atherosclerosis of native arteries of extremities with intermittent claudication, unspecified extremity: Secondary | ICD-10-CM

## 2010-08-21 NOTE — Assessment & Plan Note (Signed)
OFFICE VISIT  [name] [name], [name] [name] DOB:  01-09-1941                                       08/20/2010 ZOXWR#:[id number]  I saw the patient in the office today for follow-up after recent left axillobifemoral bypass grafting.  This is a pleasant 70 year old gentleman who I have been following with multilevel arterial occlusive disease and diffuse aortoiliac occlusive disease.  His symptoms progressed significantly and he wished to pursue revascularization.  He had some circumferential calcific disease of his aorta.  We explored his aorta, but there was no way to clamp.  He had a porcelain aorta. Therefore he underwent left axillobifemoral bypass grafting.  He did well postoperatively except for some postoperative ileus which resolved. He comes in for his first outpatient visit.  Overall he has been doing well.  He has no claudication, rest pain or nonhealing ulcers.  He has had no problems with nausea or vomiting, and he is tolerating his diet and his bowels are functioning normally.  PHYSICAL EXAMINATION:  This a pleasant 70 year old gentleman who appears his stated age.  Temperature is 97.8, blood pressure 161/71, heart rate is 72.  Lungs:  Clear bilaterally to auscultation.  Incisions are all healed nicely.  He has warm and well-perfused feet.  Abdomen:  Soft and nontender with normal pitched bowel sounds.  Overall I am pleased with his progress.  Of note, he is scheduled to have a carotid duplex scan by Dr. Hazle Coca office who has been following his carotids.  In addition, he has scheduled a duplex of his renal arteries.  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  I do not see any evidence of significant renal artery stenosis. He does have a large calcific plaque just above his left renal artery, but this does not appear to affect the perfusion to the left  kidney.  Of note, I did see a note that he potentially had a chest x-ray which showed a 4-mm nodule in the right lower lobe; however, I reviewed his x- rays on the [facility] system, and I do not see any x-rays recently which show evidence of a nodule.  Along these same lines, the patient that has not smoked since his surgery.  I plan on seeing him back in 6 months with follow-up ABIS.  He knows to call sooner he has problems.    Di Kindle. Edilia Bo, M.D. Electronically Signed  CSD/MEDQ  D:  08/20/2010  T:  08/21/2010  Job:  [id number]  cc:   Windle Guard, M.D. Nanetta Batty, M.D.

## 2010-09-03 ENCOUNTER — Ambulatory Visit (HOSPITAL_COMMUNITY)
Admission: RE | Admit: 2010-09-03 | Discharge: 2010-09-03 | Disposition: A | Discharge status: Home / Self Care | Payer: Medicare Other | Source: Ambulatory Visit | Attending: Cardiovascular Disease | Admitting: Cardiovascular Disease

## 2010-09-03 ENCOUNTER — Encounter (HOSPITAL_COMMUNITY): Payer: Self-pay

## 2010-09-03 DIAGNOSIS — I700 Atherosclerosis of aorta: Secondary | ICD-10-CM | POA: Insufficient documentation

## 2010-09-03 DIAGNOSIS — I6529 Occlusion and stenosis of unspecified carotid artery: Secondary | ICD-10-CM | POA: Insufficient documentation

## 2010-09-03 DIAGNOSIS — R930 Abnormal findings on diagnostic imaging of skull and head, not elsewhere classified: Secondary | ICD-10-CM | POA: Insufficient documentation

## 2010-09-03 HISTORY — DX: Essential (primary) hypertension: I10

## 2010-09-03 MED ORDER — IOHEXOL 350 MG/ML SOLN
50.0000 mL | Freq: Once | INTRAVENOUS | Status: AC | PRN
  Administered 2010-09-03: 50 mL via INTRAVENOUS

## 2010-11-04 NOTE — Consult Note (Signed)
NEW PATIENT CONSULTATION   [name], [name]  DOB:  02-08-1941                                       01/19/2007  ZOXWR#:[id number]   I saw [name] in the office today in consultation concerning his left  calf claudication.  He was referred by Dr. Darrelyn Hillock.  This is a pleasant  70 year old gentleman who has had a long history of left leg pain.  He  has had pain in his left hip associated with ambulation and also some at  rest and also left calf claudication.  He underwent work-up by Dr.  Darrelyn Hillock and was found to have severe spinal stenosis at L5-S1 with a  large ruptured disc at L5-S1 on the left.  He underwent surgery by Dr.  Darrelyn Hillock and has had an excellent result and his hip pain has completely  resolved.  He now has persistent left calf pain and he is sent for a  vascular consultation.  He states that he has had these symptoms in the  calf for approximately 12 years but over the last year his symptoms have  progressed significantly.  He can now walk about one to two blocks  before experiencing symptoms.  At this point, he is not having hip or  thigh claudication.  He has had no symptoms in the right leg.  He has  had no history of rest pain and no history of nonhealing wounds.   PAST MEDICAL HISTORY:  1. Hypertension.  2. Hypercholesterolemia.  3. He denies any history of diabetes, history of previous myocardial      infarction, history of congestive heart failure or history of COPD.   FAMILY HISTORY:  There is no history of premature cardiovascular  disease.   SOCIAL HISTORY:  He is single.  He has two children.  He smokes one to  one and a half packs per day of cigarettes and has been smoking for 40  years.   REVIEW OF SYSTEMS:  Documented on the medical history form in his chart.   MEDICATIONS:  Documented on the medical history form in his chart.   PHYSICAL EXAMINATION:  VITAL SIGNS:  Blood pressure 185/82, heart rate  61.  NECK:  I did not detect  any carotid bruits.  LUNGS:  Clear to auscultation bilaterally.  CARDIOVASCULAR:  He has a regular rate and rhythm.  ABDOMEN:  Soft and nontender.  I cannot palpate an aneurysm.  PERIPHERAL VASCULAR:  He has palpable right femoral pulse.  I cannot  palpate a left femoral pulse.  He has a palpable popliteal and posterior  tibial pulse on the right.  I could not palpate popliteal or pedal  pulses on the left side.  He has no ischemic ulcers on his feet.   Doppler study in our office today showed monophasic Doppler flow in the  left femoral position and monophasic Doppler signals in the left foot.  On the right side, he had a biphasic femoral and biphasic pedal signals.  ABI on the right was 76% and on the left was 34%.   We have discussed the importance of tobacco cessation and I have also  given him the number for . [facility] Tobacco  Cessation Program.  In addition, we have discussed the importance of a  structured walking program.  However, he feels his symptoms are quite  bothersome and he would like to pursue further work-up.  We have  discussed arteriography.  There is a small chance that we will find an  iliac lesion on the left amenable to angioplasty, although I think he  likely has more diffuse multilevel disease on the left.  We have  discussed the indications for arteriography and angioplasty and the  potential complications including, but not limited to, bleeding,  arterial injury, thrombosis and dissection.  All of his questions were  answered and he is agreeable to proceed on January 24, 2007.  Will make  further recommendations pending the results of this study.   Di Kindle. Edilia Bo, M.D.  Electronically Signed   CSD/MEDQ  D:  01/19/2007  T:  01/20/2007  Job:  [id number]

## 2010-11-04 NOTE — Assessment & Plan Note (Signed)
OFFICE VISIT   [name] [name], [name] [name]  DOB:  1941-03-16                                       07/11/2009  ZOXWR#:[id number]   I saw [name] [name] in the office today for continued followup of his  peripheral vascular disease.  I had last seen him in December 2010.  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  Since I saw him in December his symptoms have progressed  somewhat, and he came in to discuss possible revascularization.  I had  originally seen him in consultation with left calf claudication back in  2008.  At that time he underwent an arteriogram and was noted to have  significantly calcific vessels but patent infrarenal aorta with  bilateral iliac artery occlusive disease.  At that time he decided we  would continue with conservative treatment.  However, he is continuing  to have significant calf claudication bilaterally but more significantly  on the right side where his symptoms have progressed especially since  the summer.  He experiences claudication in both calves brought on by  ambulation and relieved with rest.  There are no other aggravating or  alleviating factors.  He has had no history of rest pain and no history  of nonhealing wounds.   PAST MEDICAL HISTORY:  Significant for hypertension.  He denies any  history of diabetes, history of previous myocardial infarction, history  of congestive heart failure or history of COPD.  He does have mildly  elevated cholesterol and is on Crestor.  In addition, he had a TIA in  2004 associated with some right hand weakness.  He denies any history of  congestive heart failure or COPD.   FAMILY HISTORY:  There is no history of premature cardiovascular  disease.   SOCIAL HISTORY:  He continues to smoke and is down to about half a pack  per day.   REVIEW OF SYSTEMS:  CARDIOVASCULAR:  He has no chest pain, chest  pressure, palpitations or arrhythmias.  He has had  no history of recent  stroke, TIA or amaurosis fugax.  He had no history of DVT or phlebitis.  PULMONARY:  He has had no productive cough, bronchitis, asthma or  wheezing.   PHYSICAL EXAMINATION:  This is a pleasant 70 year old gentleman who  appears his stated age.  Blood pressure 160/62, heart rate is 73.  Temperature is 98.  Lungs:  Clear bilaterally to auscultation without  rales, rhonchi or wheezing.  Cardiovascular:  I do not detect any  carotid bruits.  He has a regular rate and rhythm without murmur  appreciated.  He has palpable radial pulses.  I cannot palpate femoral,  popliteal or pedal pulses on either side.  Abdomen:  Soft and nontender  with normal pitched bowel sounds and no masses appreciated.  Neurologic:  He has no focal weakness or paresthesias.   I reviewed his CT angio from back in November which shows bilateral  iliac artery occlusions and markedly calcific vessels diffusely.  I have  also reviewed his ABIs which showed he had an ABI of 34% on the right  and 57% on the left on 05/23/2009.   We have had a long discussion today in the office about potentially  proceeding with aortofemoral bypass grafting.  I have explained that  certainly we could continue with conservative  treatment with attempts at  tobacco cessation and a structured walking program.  If he wishes to  proceed with surgery, I think he would best be served with aortofemoral  bypass grafting given that he is fairly young and healthy.  We also  discussed possible axillobifemoral bypass grafting, but I have explained  that this typically is reserved for sicker patients, as the patency is  not as good with axillofemoral bypass grafting.  We have discussed the  potential complications associated with aortofemoral bypass grafting  including the risk of graft infection, wound healing problems, bleeding,  myocardial infarction or other unpredictable medical problems.  His  arteries are quite calcified and  that does increase his risk slightly.  He would like to consider this further before deciding whether or not he  wants to proceed with aortofemoral bypass grafting.  If he does, he  tells me that he has been cleared from a cardiac standpoint by Dr.  Allyson Sabal.  If I do not hear from him, I will plan on seeing him back in 6  months with followup ABIs.     Di Kindle. Edilia Bo, M.D.  Electronically Signed   CSD/MEDQ  D:  07/11/2009  T:  07/12/2009  Job:  [id number]   cc:   Nanetta Batty, M.D.  Ronald A. Darrelyn Hillock, M.D.

## 2010-11-04 NOTE — Assessment & Plan Note (Signed)
OFFICE VISIT   [name], [name]  DOB:  [birth year]                                       05/08/2008  GEXBM#:[id number]   I saw the patient in the office today for continued followup of his  bilateral lower extremity claudication.  I have been following him with  bilateral calf claudication.  He has undergone back surgery by Dr.  Darrelyn Hillock and has had an excellent result from this standpoint and is very  pleased with that.  He had undergone an arteriogram which showed diffuse  aortoiliac occlusive disease and he was not felt to be ideal candidate  for endovascular approach.  We discussed possible aortofemoral bypass  grafting, but agreed to continue with conservative treatment for now.  I  last saw him in February of 2009.   Since I saw him last, he continues to have claudication in both calves,  which is more significant on the left side.  This occurs at  approximately 100 yards.  He has had no significant hip or thigh  claudication.  He has had no rest pain.  No history of nonhealing  wounds.  He feels that since February his symptoms have gradually  improved.   SOCIAL HISTORY:  He has cut back to half a pack per day of cigarettes.  He had smoked up to 2 packs per day of cigarettes.   REVIEW OF SYSTEMS:  CARDIAC:  He has had no chest pain, chest pressure,  palpitations or arrhythmias.  PULMONARY:  He has had no bronchitis, asthma or wheezing.   PHYSICAL EXAMINATION:  This is a pleasant 70 year old gentleman who  appears his stated age.  Blood pressure is 159/66, heart rate is 61.  Neck is supple.  There are no carotid bruits.  Lungs are clear  bilaterally to auscultation.  On cardiac exam, he has a regular rate and  rhythm without murmur appreciated.  Abdomen:  Soft and nontender.  Has  diminished femoral pulses and monophasic Doppler signals in both feet.  He has no ischemic ulcers.  He has no significant lower extremity  swelling.   Doppler study in  our office today shows an ABI of 65% on the right,  which is about the same as 69% in February.  On the left ABI is 48%  which is about the same is 44% in February.   Overall, I am pleased with his progress.  He seems to be happy with the  conservative approach and will continue his structured walking program  and continue to work on tobacco cessation.  He enjoys hiking and  hopefully he will find some trails that are more level so that he can  continue this hobby.  I will see him in 1 year.  He knows to call sooner  if he has problems.   Di Kindle. Edilia Bo, M.D.  Electronically Signed   CSD/MEDQ  D:  05/08/2008  T:  05/09/2008  Job:  [id number]   cc:   Windy Fast A. Darrelyn Hillock, M.D.

## 2010-11-04 NOTE — Op Note (Signed)
NAME:  [name] [name], [name] [name] NO.:  [id number]   MEDICAL RECORD NO.:  [id number]          PATIENT TYPE:  INP   LOCATION:  2899                         FACILITY:  [facility]   PHYSICIAN:  Di Kindle. Edilia Bo, M.D.DATE OF BIRTH:  26-Jan-1941   DATE OF PROCEDURE:  01/24/2007  DATE OF DISCHARGE:                               OPERATIVE REPORT   PREOPERATIVE DIAGNOSIS:  Aortoiliac occlusive disease.   POSTOPERATIVE DIAGNOSIS:  Aortoiliac occlusive disease.   PROCEDURE:  1. Aorta aortogram.  2. Bilateral iliac arteriogram.  3. Bilateral lower extremity runoff.   SURGEON:  Di Kindle. Edilia Bo, M.D.   ANESTHESIA:  Local with sedation.   TECHNIQUE:  The patient was taken to the PV lab at [facility] and sedated with  a milligram of Versed and 50 mcg of fentanyl.  Both groins were prepped  and draped in usual sterile fashion.  I thought we might potentially be  able to do a left iliac angioplasty.  I therefore cannulated the left  groin after the skin was anesthetized.  However, I could not pass the  wire through an external iliac artery occlusion.  I placed a 5-French  sheath over the wire and then stuck the right side again after the skin  was anesthetized.  The right common femoral artery was cannulated.  A  guidewire introduced.  I had to use a guide catheter and angled  Glidewire to get up the right side and then a 5-French sheath was  introduced over the wire.  Pigtail catheter was positioned at the L1  vertebral body and flush aortogram obtained.  Oblique iliac projections  were obtained.  Bilateral lower extremity runoff films were obtained  through the sheaths.   FINDINGS:  There area accessory renal arteries bilaterally.  No  significant renal artery stenosis identified.  The infrarenal aorta  appears to be markedly calcified.  There is no focal stenosis.   On the right side there is approximately 50% stenosis of the proximal  right common iliac artery just below the  bifurcation.  The hypogastric  artery on the right is occluded.  There are two other eccentric plaques  noted in the common iliac artery, external iliac artery is patent.  The  common femoral, superficial femoral and deep femoral arteries on the  right are patent.  The popliteal and proximal tibial vessels are all  patent.  There is three-vessel runoff on the right via the anterior  tibial, posterior tibial and peroneal arteries.  There is poor  visualization the tibials distally.   On the left side there is some moderate diffuse disease of the distal  common iliac artery.  There is a dominant hypogastric artery on the  left.  The external iliac artery on the left is occluded proximally.  There is reconstitution distally.  There is a eccentric plaque at the  bifurcation of the common femoral artery.  The superficial femoral  artery is patent with a focal occlusion at the adductor canal.  The deep  femoral artery is patent.  The above-knee and  below-knee popliteal arteries are patent.  There is three-vessel  runoff  on the left via the anterior tibial, posterior tibial and peroneal  arteries.  Again there is poor visual visualization of the tibials  distally.   CONCLUSIONS:  Bilateral significant common iliac artery disease as  described above.      Di Kindle. Edilia Bo, M.D.  Electronically Signed     CSD/MEDQ  D:  01/24/2007  T:  01/24/2007  Job:  [id number]   cc:   Windy Fast A. Darrelyn Hillock, M.D.

## 2010-11-04 NOTE — Op Note (Signed)
NAME:  [name] [name], [name] [name] NO.:  [id number]   MEDICAL RECORD NO.:  [id number]          PATIENT TYPE:  AMB   LOCATION:  DAY                          FACILITY:  [facility]   PHYSICIAN:  Georges Lynch. Gioffre, M.D.DATE OF BIRTH:  12/12/1940   DATE OF PROCEDURE:  11/23/2006  DATE OF DISCHARGE:                               OPERATIVE REPORT   SURGEON:  Georges Lynch. Darrelyn Hillock, M.D.   ASSISTANT:  Marlowe Kays, M.D.   PREOPERATIVE DIAGNOSIS:  1. Foraminal and lateral recess stenosis at L5-S1 on the left.  2. 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  3. Preop, the patient complained of severe pain in his left buttock      and left leg.   POSTOPERATIVE DIAGNOSIS:  1. Foraminal and lateral recess stenosis at L5-S1 on the left.  2. 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  3. Preop, the patient complained of severe pain in his left buttock      and left leg.   OPERATION:  1. Decompression for spinal stenosis L5-S1.  2. Microdiscectomy L5-S1 on the left.   PROCEDURE:  Under general anesthesia, routine orthopedic prep and drape  of the lower back was carried out.  Two needles were placed in the back  for localization purposes.  An x-ray was taken.  The x-ray revealed that  we were definitely in the L5-S1 space.  Following this, an incision was  made over L5-S1.  Bleeders were identified and cauterized.  The muscle  was stripped from the lamina and spinous process in the usual fashion.  Self-retaining retractors were inserted and a second x-ray was taken to  verify our exact position.  At this time, I carried out a  hemilaminectomy at L5-S1 on the left.  His disc space was collapsed. He  had severe stenosis.  I decompressed the lateral recess.  I did a nice  foraminotomy to free up the S1 root.  Following that, I went down and  made a cruciate incision in the posterior longitudinal ligament with  great care taken to protect the dura.  I did utilize the microscope at  this time.  I  then went down in the disc space and carried out a  microdiscectomy.  I utilized the [employer] as well as a nerve hooks  to tease out the disc material that was herniated subligamentous.  I  went down into the axilla of the root, as well, and checked that out and  there were no fragments noted there.  We did a nice foraminotomy,  explored the root, the root now was totally free.  The lateral recess  now was free.  I cauterized the lateral recess veins.  I went up  proximally with a hockey stick and made sure there were no other disc  fragments.  I thoroughly irrigated out the area.  I loosely applied a  few pieces of thrombin soaked Gelfoam.  I closed the wound in layers in  the usual fashion except I left a small portion of the deep portion of  the wound open proximally for drainage  purposes.  The skin was closed with metal staples.  I injected about 14  mL of 0.5% Marcaine and epinephrine into the muscle.  The skin was  closed with metal staples.  A sterile Neosporin dressing was applied.  The patient had 30 mg of IV Toradol prior to leaving the operating room.  He had 500 mg of clindamycin preop.           ______________________________  Georges Lynch. Darrelyn Hillock, M.D.     RAG/MEDQ  D:  11/23/2006  T:  11/23/2006  Job:  [id number]

## 2010-11-04 NOTE — Assessment & Plan Note (Signed)
OFFICE VISIT   [name] [name], [name] [name]  DOB:  09/15/1940                                       05/23/2009  XBMWU#:[id number]   I saw the patient in the office today for continued followup of his  peripheral vascular disease.  I had originally seen him in consultation  concerning his left calf claudication back in July of 2008.  He was  referred by Dr. Darrelyn Hillock.  In August of 2008 he underwent an aortogram  which demonstrated a markedly calcified infrarenal aorta with no focal  stenosis.  He had bilateral left iliac disease.  He had mild  infrainguinal arterial occlusive disease.  His symptoms were quite  stable and I have been following his claudication.  He was last seen on  05/08/2008.  At that time ankle brachial index on the right was 65% and on the left  48%.  He seemed to be happy with a conservative approach and we had  continued to stress the importance of tobacco cessation and stressed the  importance of remaining on a structured walking program.  I planned on  seeing him back in 1 year.   Since I saw him last he states that about 3 months ago while working in  the garden he developed sudden pain in the right foot which was  associated with paresthesias of the right foot.  These symptoms remained  fairly stable and he was seen at [facility].  He was subsequently  referred to Dr. Allyson Sabal and was set up to have a CT angiogram which  demonstrates multilevel arterial occlusive disease.  He has continued to  have some stable claudication in the right calf with some mild  claudication in the right thigh.  He has had no significant symptoms on  the left.  He denies any history of rest pain or any history of  nonhealing ulcers.   PAST MEDICAL HISTORY:  Significant for hypertension.  In addition, he  had a TIA in 2004 associated with some right hand weakness and right  facial symptoms.  He has had no subsequent history of stroke, TIAs,  expressive or receptive  aphasia or amaurosis fugax.   He denies any history of diabetes, hypercholesterolemia, history of  previous myocardial infarction, history of congestive heart failure or  history of COPD.   FAMILY HISTORY:  There is no history of premature cardiovascular  disease.   SOCIAL HISTORY:  He is single and he has 2 children.  He smokes less  than a pack per day of cigarettes.  He has been smoking since he was 70  years old.  He has 1-2 drinks per day.   MEDICATIONS:  Are documented on the medical history form in his chart.   ALLERGIES:  He is allergic to Ceclor.   REVIEW OF SYSTEMS:  CARDIOVASCULAR:  He has had no chest pain, chest  pressure, palpitations or arrhythmias.  He has had no significant  orthopnea or dyspnea on exertion.  He has right calf claudication.  He  has had no history of amaurosis fugax.  No history of DVT or phlebitis.  MUSCULOSKELETAL:  He has some mild arthritic pain and occasional muscle  pain in the calf and thigh on the right.  He has had some loss of  hearing recently.  General, pulmonary, GU, neurologic, psychiatric, hematologic and  integumentary review of  systems is unremarkable.  GI:  He does have occasional diarrhea.   PHYSICAL EXAMINATION:  General:  This is a pleasant 70 year old  gentleman who appears his stated age.  Vital signs:  His blood pressure  is 164/69, heart rate is 76, temperature 97.9.  Cardiovascular:  He has  a soft left carotid bruit.  He has a regular rate and rhythm without  murmur or gallop appreciated.  He has absent femoral pulses.  I cannot  palpate popliteal or pedal pulses on either side.  He has a palpable  radial pulse bilaterally.  He has no significant lower extremity  swelling.  Abdomen:  Soft and nontender.  I cannot appreciate an  aneurysm.  He has normal pitched bowel sounds.  Musculoskeletal:  There  are no major deformities or cyanosis.  Neurological:  He has no focal  weakness or paresthesias.  Lymphatic:  He has no  significant cervical,  axillary or inguinal lymphadenopathy.   I have independently interpreted his arterial Doppler study today which  shows monophasic Doppler signals in the posterior tibial position  bilaterally.  Anterior tibial signal cannot be detected on the right.  He has a monophasic anterior tibial signal on the left.  ABI on the  right is 34% down from 65% in 04/2008.  ABI on the left is 57% which has  actually increased slightly.   I have reviewed his CT angiogram which does show diffuse multilevel  arterial occlusive disease.  Most importantly he has a complete  occlusion of the right common iliac artery and external iliac artery on  the right.  He has a short segment occlusion of the left external iliac  artery over shorts.   The patient reportedly had a carotid duplex scan at Dr. Hazle Coca office  which showed significant carotid disease and it sounds like he is  scheduled for cerebral arteriography in addition to cardiac  catheterization and aortogram.  I do not have the results of his carotid  duplex scan but he is going to try to obtain those results.  I have  explained that from my perspective if he is asymptomatic and the  stenosis is less than 80% typically we would not recommend carotid  endarterectomy.  If he developed new neurologic symptoms or his carotid  stenosis progressed to greater than 80% typically we would recommend  carotid endarterectomy.   With respect to his aortoiliac occlusive disease currently his symptoms  are quite tolerable but if the symptoms progressed he would likely  require aortofemoral bypass grafting unless he is a candidate for an  endovascular approach which I believe Dr. Allyson Sabal will determine when he  is studied on 12/10.  I will be out of town that day but will review his  films when I return and will be happy to assist if he requires any  surgical intervention.  Otherwise I plan on seeing him back in 6 months.  Again we have  discussed the importance of tobacco cessation.   Di Kindle. Edilia Bo, M.D.  Electronically Signed   CSD/MEDQ  D:  05/23/2009  T:  05/24/2009  Job:  [id number]   cc:   Windy Fast A. Darrelyn Hillock, M.D.  Nanetta Batty, M.D.

## 2010-11-04 NOTE — Assessment & Plan Note (Signed)
OFFICE VISIT   JAIMES, ECKERT  DOB:  [birth year]                                       02/08/2007  AVWUJ#:[id number]   I saw the patient in the office today for continued followup of his  peripheral vascular disease. This is a pleasant 70 year old gentleman  with a long history of bilateral leg pain. He was found to have a severe  spinal stenosis at L5-S1 with a ruptured disc at L5-S1 and underwent  surgery by Dr.  Darrelyn Hillock and has had an excellent result. He was left  vascular claudication especially on the left side. When I had seen him  in consultation on January 19, 2007, we recommended arteriography and he  underwent an arteriogram on January 24, 2007. This showed diffuse aortic  iliac occlusive disease and he was not a good candidate for endovascular  approach. He came in today to discuss these results.   He states that he has begun cilostazol and this actually has helped his  symptoms somewhat. He has also been taking Chantix and hopes to quit  smoking shortly.   PHYSICAL EXAMINATION:  Blood pressure 156/76, heart rate 60. He has  diminished femoral pulses with no hematoma. His feet are warm and  adequately perfused.   I have discussed the options with him today. We have discussed tobacco  cessation, a structured walking program and use of cilostazol as his non-  operative approach. If this were unsuccessful, I think the best option  would be aortofemoral bypass grafting. Currently, he feels that his  symptoms have actually improved already and he is willing to try a non-  operative approach. He is on cilostazol and as I said he is about to  quit smoking. His quit date is in two days. He seems fairly motivated  and enjoys walking and I think this will work well for him. If it does  not, I think  the best surgical option would be aortofemoral bypass grafting. I plan  on seeing him back in six months with followup ABIs. He knows to call  sooner if he  has problems.   Di Kindle. Edilia Bo, M.D.  Electronically Signed   CSD/MEDQ  D:  02/08/2007  T:  02/09/2007  Job:  [id number]   cc:   Windy Fast A. Darrelyn Hillock, M.D.

## 2010-11-04 NOTE — Assessment & Plan Note (Signed)
OFFICE VISIT   [name] [name], [name] [name]  DOB:  Jan 26, 1941                                       12/12/2009  EAVWU#:[id number]   I saw the patient in the office today for continued followup of his  peripheral vascular disease.  I last saw him in January of 2011.   Since I saw him last he continues to have claudication in both lower  extremities associated with ambulation and relieved with rest.  His  symptoms are equal on both sides.  He can walk approximately a quarter  of a mile before experiencing symptoms.  His symptoms have been stable  over the last 6 months.  He denies any history of rest pain and has had  no history of nonhealing wounds.  He does state that he also gets pain  in his legs when he is working in the garden and bends over.  Of note,  he had a lot of interest in hiking in the past and because of his  symptoms has not been able to do much hiking which he misses.   His past medical history is significant for hypertension which has been  stable on his current medications.  He denies any history of diabetes,  history of hypercholesterolemia, history of myocardial infarction or  history of congestive heart failure.   SOCIAL HISTORY:  He is single.  He has two children.  He smokes a half a  pack per day of cigarettes and has done anything possible to quit but  has not been successful.   REVIEW OF SYSTEMS:  On review of systems he has had no recent chest  pain, chest pressure, palpitations or arrhythmias.  He has had no  history of stroke or TIA.  He has had no history of DVT or phlebitis.  PULMONARY:  He has had no productive cough, bronchitis, asthma or  wheezing.   PHYSICAL EXAMINATION:  General:  This is a pleasant 70 year old  gentleman who appears his stated age.  Vital signs:  Blood pressure is  186/81, heart rate is 66, respiratory rate is 16.  HEENT:  Unremarkable.  Lungs:  Are clear bilaterally to auscultation without rales or wheezing.  Cardiovascular:  I do not detect any carotid bruits.  He has a regular  rate and rhythm.  I cannot palpate femoral, popliteal or pedal pulses on  either side.  He has monophasic Doppler signals in both feet.  Abdomen:  Soft and nontender with pitched bowel sounds.  Musculoskeletal:  There  are no major deformities or cyanosis.  Neurologic:  He has no focal  weakness or paresthesias.   The patient did have a CT angiogram in November of 2010 which shows  diffuse aortoiliac occlusive disease.  He has an occlusion of the right  common iliac artery and external iliac artery.  Also the left external  iliac artery is occluded.  There is reconstitution of the common femoral  arteries bilaterally although these also have diffuse disease.  There  was moderate disease in the superficial femoral arteries and popliteal  arteries bilaterally with predominant runoff being via the posterior  tibial arteries bilaterally.   Back in January he felt that his symptoms were tolerable and did not  wish to pursue revascularization at that time.  We had discussed the  option of proceeding with aortofemoral bypass grafting.  He did have an arterial Doppler study in our office today which I  independently interpreted and this shows an ABI of 45% on the right  which has actually improved slightly compared to his previous ABI of 34%  on the right.  On the left side ABI was 52% and it was stable.   We have again discussed the option of proceeding with aortobifemoral  bypass grafting.  He would like to continue with attempts at a  structured walking program and at this point does not want to proceed  with surgery.  He plans a trip to [place] and [place] in the fall  and will follow up after this.  If his symptoms progress he says that he  would be willing to consider aortofemoral bypass grafting.  If he does  elect to proceed with this I think it would be useful to obtain an  arteriogram via a left brachial  approach to further evaluate him given  the diffuse disease noted in his common femoral arteries.  He would also  need clearance by Dr. Allyson Sabal for surgery.  He has previously been cleared  for surgery but had elected not to proceed.  I will see him back in  December.  He knows to call sooner if he has problems.     Di Kindle. Edilia Bo, M.D.  Electronically Signed   CSD/MEDQ  D:  12/12/2009  T:  12/13/2009  Job:  [id number]   cc:   Windy Fast A. Darrelyn Hillock, M.D.  Nanetta Batty, M.D.

## 2010-11-04 NOTE — Assessment & Plan Note (Signed)
OFFICE VISIT   AVERI, CACIOPPO  DOB:  04/26/1941                                       06/04/2010  EAVWU#:[id number]   I saw the patient in the office today for continued follow-up of his  peripheral vascular disease.  I had last seen him in June 2011.  He had  stable claudication, which we have been following.  At that time his ABI  on the right was 45% and on the left 52%.  We discussed the importance  of a structured walking program and again encouraged him to try to get  off of the tobacco.   He comes in for a 44-month follow-up visit.  He states that his  claudication symptoms in both legs have gradually progressed.  He  experiences claudication in the right and left calf and thighs brought  on by ambulation and is relieved with rest.  This occurs at about half a  block.  His symptoms are more significant on the right side.  He has now  developed some early rest pain in the right foot.  He has had no history  of nonhealing ulcers.   PAST MEDICAL HISTORY:  Significant for hypertension and  hypercholesterolemia.  His blood pressure recently has not been well-  controlled.   SOCIAL HISTORY:  He is single.  He has 2 children.  He smokes a pack per  day of cigarettes and has been smoking since he was 69 years old.   REVIEW OF SYSTEMS:  CARDIOVASCULAR:  He had no chest pain, chest  pressure, palpitations or arrhythmias.  He has had no significant  dyspnea on exertion.  He has had no history of DVT or phlebitis.  PULMONARY:  He has had no productive cough, bronchitis, asthma or  wheezing.   PHYSICAL EXAMINATION:  This is a pleasant 70 year old gentleman who  appears his stated age.  Blood pressure is 207/90 on the right, 217/96  on the left, heart rate is 63, saturation 92%.  Lungs are clear  bilaterally to auscultation.  On cardiovascular exam, he has a soft left  carotid bruit.  He has a regular rate and rhythm.  He has a barely-  palpable right  femoral pulse with no left femoral pulse.  He has a  monophasic right femoral signal with the Doppler.  I cannot palpate  popliteal or pedal pulses on either side.  He has no significant lower  extremity swelling.  He has no ischemic ulcers.  Abdomen is soft and  nontender with normal-pitched bowel sounds.  Musculoskeletal exam:  There are no major deformities or cyanosis.  Neurologic:  He has no  focal weakness or paresthesias.  Skin:  There are no ulcers or rashes.   I have independently interpreted his arterial Doppler study today, which  shows an ABI of 40% on the right, which is down only slightly compared  to 45% previously.  On the left side his ABI is 59%.   Given the progression of his symptoms bilaterally, I think he should be  considered for revascularization.  Based on his previous arteriogram  from 2008, the left external iliac artery was occluded.  I think we  hopefully can cannulate the right side so that we have the option of  addressing the right iliac artery disease with an endovascular approach  and leaving the option  for a fem-fem bypass graft.  Alternatively, the  other option for revascularization would be aortofemoral bypass  grafting.  He would like to schedule this after the holiday and this has  been scheduled for January 9.  We have again discussed the importance of  tobacco cessation and I have encouraged him to stay as active as  possible.  He is going to follow up with Dr. Allyson Sabal and Dr. Jeannetta Nap  concerning his blood pressure, which recently has been more difficult to  control.  If ultimately he does require surgery, I think he would  require preoperative cardiac evaluation by Dr. Allyson Sabal.  I have discussed  the indications for arteriography and the potential complications  including but not limited to bleeding, arterial injury, and renal  insufficiency.  All of his questions were answered and he is agreeable  to proceed.     Di Kindle. Edilia Bo, M.D.   Electronically Signed   CSD/MEDQ  D:  06/04/2010  T:  06/05/2010  Job:  [id number]   cc:   Windle Guard, M.D.  Nanetta Batty, M.D.

## 2010-11-04 NOTE — Assessment & Plan Note (Signed)
OFFICE VISIT   [name] [name], [name] [name]  DOB:  [birth year]                                       08/09/2007  ZOXWR#:[id number]   I saw the patient in the office today for continued followup of his  bilateral lower extremity claudication.  This is a very pleasant 70-year-  old gentleman who I had previously seen with bilateral lower extremity  claudication.  He had a history of severe spinal stenosis and underwent  surgery by Dr. Darrelyn Hillock and had an excellent result, but at that point,  he noticed significant vascular claudication.  He underwent arteriogram  which showed that he had diffuse aortoiliac occlusive disease, and he  was not a good candidate for an endovascular approach.  We had discussed  aortofemoral bypass grafting, but we both agreed to attempt conservative  treatment.  We discussed the importance of tobacco cessation and a  structured walking program.  He was also started on cilostazol.  He  comes in for a six month follow-up visit.   Since I saw him last, he states that his symptoms have actually improved  somewhat.  He experiences claudication in both calves associated with  ambulation and relieved with rest.  He has had no rest pain and no  history of nonhealing ulcers.   REVIEW OF SYSTEMS:  He has had no recent chest pain, chest pressure,  palpitations, or arrhythmias.  He has had no bronchitis, asthma, or  wheezing.   SOCIAL HISTORY:  He did quit smoking for about six weeks but has started  back and is smoking about a half pack per day.  He had been smoking up  to 1-1/2 packs per day of cigarettes.   PHYSICAL EXAMINATION:  This is a pleasant 70 year old gentleman who  appears his stated age.  Blood pressure is 139/76, heart rate 60.  I do  not detect any carotid bruits.  Lungs are clear bilaterally to  auscultation.  On cardiac exam, he has a regular rate and rhythm.  Abdomen is soft and nontender.  He has diminished femoral pulses.  I  cannot palpate pedal pulses.  Both feet appear adequately diffuse  without ischemia ulcers.  He has no significant lower extremity  swelling.   He did have a Doppler study in our office today which shows an ABI of  60% on the right with monophasic signals.  On the left side, he has an  ABI of 44%, which is actually up slightly compared to 34% previously.  He has monophasic Doppler signals in both feet.   He is comfortable with continuing with conservative treatment.  I again  discussed with him the importance of tobacco cessation.  He will  continue with his cilostazol and will try to do as much ambulating as  possible.  I plan on seeing him back in nine months.  If his symptoms  progress, he will likely require aortofemoral bypass grafting; however,  currently, his symptoms are quite stable.  He knows to call sooner if he  has problems.   Di Kindle. Edilia Bo, M.D.  Electronically Signed   CSD/MEDQ  D:  08/09/2007  T:  08/11/2007  Job:  [id number]   cc:   Windy Fast A. Darrelyn Hillock, M.D.

## 2010-11-04 NOTE — Procedures (Signed)
AORTA-ILIAC DUPLEX EVALUATION   INDICATION:  Known aorto-iliac disease; CTA November 2010 shows  occlusion of the right CIA and EIA and the left EIA.   HISTORY:  Diabetes:  No.  Cardiac:  No.  Hypertension:  Yes.  Smoking:  Yes.  Previous Surgery:  No.               SINGLE LEVEL ARTERIAL EXAM                              RIGHT                  LEFT  Brachial:                  174                    173  Anterior tibial:           Not identified         Not identified  Posterior tibial:          69                     102  Peroneal:  Ankle/brachial index:      0.40                   0.59  Previous ABI/date:         11/26/2009, 0.45       11/26/2009, 0.52   AORTA-ILIAC DUPLEX EXAM  Aorta - Proximal     184 cm/s  Aorta - Mid          63 cm/s  Aorta - Distal       33 cm/s   RIGHT                                   LEFT  Not identified    CIA-PROXIMAL          Not identified  Not identified    CIA-DISTAL            Not identified  Not identified    HYPOGASTRIC           Not identified  Not identified    EIA-PROXIMAL          Not identified  Not identified    EIA-MID               Not identified  Not identified    EIA-DISTAL            Not identified   IMPRESSION:  1. Technically difficult study due to calcification, severity of      disease.  2. Bilateral iliac arteries could not be identified, likely due to      chronic occlusion.  3. Question severe stenosis of the aorta versus diffuse calcification.      Turbulent waveforms noted throughout.   ___________________________________________  Di Kindle. Edilia Bo, M.D.   LT/MEDQ  D:  06/04/2010  T:  06/04/2010  Job:  [id number]

## 2010-11-04 NOTE — Procedures (Signed)
LOWER EXTREMITY ARTERIAL EVALUATION-SINGLE LEVEL   INDICATION:  Followup, peripheral vascular disease.   HISTORY:  Diabetes:  No.  Cardiac:  No.  Hypertension:  Yes.  Smoking:  Less than one pack per day.  Previous Surgery:  No.   RESTING SYSTOLIC PRESSURES: (ABI)                          RIGHT                LEFT  Brachial:               160                  160  Anterior tibial:        110                  Inaudible  Posterior tibial:       110 (0.69)           70 (0.44)  Peroneal:  DOPPLER WAVEFORM ANALYSIS:  Anterior tibial:        Monophasic  Posterior tibial:       Monophasic           Monophasic  Peroneal:   PREVIOUS ABI'S:  Date: 01/19/07  RIGHT:  0.76  LEFT:  0.34   IMPRESSION:  Right ankle brachial index slightly decreased and left  ankle brachial index slightly increased from previous examination.   ___________________________________________  Di Kindle. Edilia Bo, M.D.   DP/MEDQ  D:  08/09/2007  T:  08/10/2007  Job:  [id number]

## 2010-11-07 NOTE — Discharge Summary (Signed)
NAME:  [name] [name], [name] [name] NO.:  [id number]   MEDICAL RECORD NO.:  [id number]                   PATIENT TYPE:  INP   LOCATION:  3025                                 FACILITY:  [facility]   PHYSICIAN:  Casimiro Needle L. Thad Ranger, M.D.           DATE OF BIRTH:  04-15-1941   DATE OF ADMISSION:  10/04/2002  DATE OF DISCHARGE:  10/06/2002                                 DISCHARGE SUMMARY   DIAGNOSES AT TIME OF DISCHARGE:  1. 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  2. Hypertension.  3. Cigarette abuse.  4. Excess alcohol intake.   MEDICATIONS AT TIME OF DISCHARGE:  1. Aspirin 325 mg daily.  2. Plavix 75 mg daily.  3. Hydrochlorothiazide 25 mg daily.  4. Norvasc 10 mg daily.  5. Vasotec 20 mg daily.   DIET:  Low salt, low fat, regular consistency, thin liquids.   STUDIES PERFORMED:  1. CT of the brain on admission showed old lacune basal ganglia, right     maxillary sinus disease, no acute abnormality.  2. MRI of the brain did show an acute right thalamic infarction with old     right basal ganglia infarct.  3. Carotid Dopplers showing no ICA stenosis bilaterally, vertebral artery     flow antegrade bilaterally.  4. Two-dimensional echocardiogram which shows ejection fraction of 70%.  No     left ventricular regional wall abnormalities.  No obvious embolic source.  5. EKG shows normal sinus rhythm with anteroseptal infarct, age     undetermined.   LABORATORY STUDIES:  Homocysteine 7.65.  Cholesterol 191, triglycerides 78,  HDL 46 and LDL 129.  Cardiac enzymes are negative.  CBC with white blood  cells 9.5, red blood cells 5.05, hemoglobin 16.4, hematocrit 47.8, MCV 94.5  and MCHC 34.2.  INR 1.0.  Sodium 133, potassium 3.8, chloride 99, CO2 25,  glucose 116, BUN 5, creatinine 0.8, calcium 9.0, total protein 7.1, albumin  3.7, AST 26, ALT 18, ALP 62 and total bilirubin 0.7.   HISTORY OF PRESENT ILLNESS:  The patient is a 70 year old  right-handed white  male with a history of hypertension and smoking, who presents today after  waking with right face, tongue, arm and hand numbness as well as right upper  extremity clumsiness.  No associated headache, syncope, chest pain,  palpitations, etc.  He came to the emergency room for evaluation.  He was  admitted for further stroke workup.  He was not eligible for TPA due to time  and decreased severity of symptoms.   HOSPITAL COURSE:  MRI did confirm an acute left thalamic infarction causing  right face greater than hand numbness.  Source was felt to be related to  small vessel disease as well as history of hypertension and smoking.  Blood  pressures were elevated during hospitalization in the 160s/90s, patient on  Norvasc and Enalapril prior.  We added hydrochlorothiazide for lowering  blood pressure as well as for secondary stroke prevention.  Patient on  aspirin prior to admission; we added Plavix for secondary stroke prevention.  Did recommend the use of a statin.  He states he has been on a statin in the  past with increased muscle weakness, by Dr. Madaline Savage; that  medication was changed with his complaint and the patient was unsure of what  he was on at home.  He will look at his bottle and may resume taking it; he  will discuss with Dr. Windle Guard.   Risk factor control is also recommended for recurrent stroke prevention,  patient encouraged to stop smoking as well as to keep his blood pressure  under control.  He will need some outpatient occupational therapy for  followup.   CONDITION AT DISCHARGE:  Patient alert and oriented x3.  Speech clear.  No  aphasia.  No facial weakness.  He does have some right arm clumsiness with  finger-nose-finger, but he has no arm drift.  Strength is normal.  Sensation  is decreased in his face and hands subjectively but intact to touch and  pinprick.  Chest with coarse rhonchi bilaterally and heart rate is regular.    DISCHARGE PLAN:  1. Home with wife.  2. Aspirin and Plavix for secondary stroke prevention.  3. Hydrochlorothiazide added to blood pressure regimen.  4. Follow up with Dr. Jeannetta Nap within one month.  5. Discuss restarting statin with Dr. Jeannetta Nap.  6. Stop smoking.  7. Decrease alcohol to two drinks per day.  8. Outpatient occupational therapy.  9. Follow up with Dr. Marlan Palau at [facility] in three weeks.  10.      Return to work in one to two weeks.     [name], N.P.                         Marolyn [name]. Thad Ranger, M.D.    SB/MEDQ  D:  10/06/2002  T:  10/07/2002  Job:  [id number]   cc:   Marlan Palau, M.D.  [address]  [address]  [place]  [place] [address]  Fax: [phone]   Windle Guard, M.D.  [address]  [place], [place] [address]  Fax: [phone]
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
## 2010-11-07 NOTE — H&P (Signed)
NAME:  [name] [name], [name] [name] NO.:  [id number]   MEDICAL RECORD NO.:  [id number]                   PATIENT TYPE:  EMS   LOCATION:  [facility]JO                                 FACILITY:  [facility]   PHYSICIAN:  Genene Churn. Love, M.D.                 DATE OF BIRTH:  08/19/1940   DATE OF ADMISSION:  10/04/2002  DATE OF DISCHARGE:                                HISTORY & PHYSICAL   PATIENT ADDRESS:  [address] [place], [place] [address].   REASON FOR ADMISSION:  This is the first [facility] admission for  this 70 year old right-handed white divorced male from [place], [place], who has been followed by Dr. Windle Guard.  He has a known  history of high blood pressure and is admitted from the emergency room for  evaluation of right face and arm numbness acute onset.   HISTORY OF PRESENT ILLNESS:  The patient has a 12-year history of  hypertension and about five years ago noted right arm numbness thought to  represent a pinched nerve in his neck.  He has also in the last month been  seen [facility]  [facility] for evaluation of neck pain and left shoulder pain.  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   He noted this morning when he awoke that he had right face, tongue, and  right arm numbness with clumsiness of his right hand and arm and difficulty  using his right hand and arm to write.  There was no associated headache,  syncope, chest pain, amaurosis fugax, palpitations, etc.  He has no history  of drug or alcohol abuse.  He came to the emergency room for further  evaluation.  He denies any head or neck trauma.   PAST MEDICAL HISTORY:  1. Hypertension for 12 years.  2.  Cigarettes at one and one-half packs per day for 40 years.  3. Tonsillectomy 30 years ago.  4. Hemorrhoidectomy 20 years ago.   MEDICATIONS AT HOME:  1. Aspirin 1 daily  2. Norvasc, dosage unknown, 1 daily.  3. Captopril, dosage unknown, 1 daily.   SOCIAL HISTORY:  He drinks approximately 3 ounces of alcohol per day and  smokes one and one-half packs per day of cigarettes which he has done for 40  years.  He finished 11th grade at school.  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   FAMILY HISTORY:  His mother died at age 79 from Alzheimer's disease.  His  father died at age 72 from prostatic cancer.  He has a brother of 61, a  sister 78, and brother 75 living and well.  He has a son 65 and a daughter  87, living and well.  There is no family history of high blood pressure,  diabetes, heart disease, or stroke.   PERSONAL HISTORY:  He denies any personal history of operations or injury or  hospitalizations other than as noted above.   PHYSICAL EXAMINATION:  GENERAL:  Well-developed, thin white male in no acute  distress.  VITAL SIGNS:  Blood pressure right arm 160/80, left arm 170/80, heart rate  80 and regular.  Right greater than left supraclavicular greater than  carotid bruits bilaterally.  NEUROLOGIC:  Mental Status:  Alert and oriented x 2.  No evidence of any  aphasia.  Cranial nerve examination revealed visual fields full, disks flat.  Spontaneous venous pulsation seen.  Extraocular movements full. Corneals  present.  Facial sensation equal.  There was a decreased pinprick on the  right side of his face versus the left.  Tongue was midline.  Uvula was  midline.  Gags were present.  Sternocleidomastoid and trapezius testing and  all motor examination revealed good strength in the upper and lower  extremities without any evidence of proximal pronator or distal drift.  Coordination testing revealed clumsiness in his right hand and arm.  He had   decreased ability to squeeze with his right hand and arm x 1,  He could  squeeze right hand 16 kg x 5, 92 kg x 1 left hand, 29 kg x about 124 kg.  His sensory examination is intact to pinprick, touch, change of position,  and vibration except for decreased two-point discrimination in the right  hand, decreased pinprick in the right hand and also in the right face.  Deep  tendon reflexes were 2+.  Plantar responses were downgoing.  HEENT:  Tympanic membranes clear.  Mouth in fair repair.  LUNGS:  Clear to auscultation.  HEART:  Systolic murmur.  ABDOMEN:  There was no enlargement of liver, spleen, or kidneys.  GU:  He was circumcised.  RECTAL:  No performed and not pertinent to the present illness.   LABORATORY DATA:  CT scan showed old left head of caudate stroke.  Old right  mesial temporal ischemic strokes.   White blood cell count 9500, hemoglobin 16.4, hematocrit 47.8, platelets  242,000.  Sodium 133, potassium 3.8, chloride 99, CO2 content 25, BUN 5,  creatinine 0.8, glucose 116.  Pro time 13.8, PTT 30.  CK 76, CK-MB 2.4,  troponin 0.02.   Chest x-ray normal.   EKG shows changes of anteroseptal infarct, age indeterminate.   IMPRESSION:  1. Right face and arm numbness (Code 782.0).  2. 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  3. Old bicerebral small vessel ischemic strokes (Code 433.31).  4. Chronic cigarette use (Code 496).  5. Hypertension (Code 796.2).   PLAN:  Use aspirin, Plavix, hold his antihypertensive medicines.  Get an MRI  study of the brain with intracranial MRA Doppler study, 2-D echocardiogram.  Begin occupational therapy.                                               Genene Churn. Sandria Manly, M.D.    JML/MEDQ  D:  10/04/2002  T:  10/04/2002  Job:  [id number]

## 2010-11-07 NOTE — H&P (Signed)
NAME:  [name] [name], [name] [name] NO.:  [id number]   MEDICAL RECORD NO.:  [id number]                   PATIENT TYPE:  EMS   LOCATION:  [facility]JO                                 FACILITY:  [facility]   PHYSICIAN:  Iva Boop, M.D. [facility]           DATE OF BIRTH:  [birth year]   DATE OF ADMISSION:  06/24/2003  DATE OF DISCHARGE:                                HISTORY & PHYSICAL   CHIEF COMPLAINT:  Rectal bleeding.   HISTORY:  This is a pleasant 70 year old white man who sees Dr. Windle Guard for his primary care.  He was in his usual state of health until  earlier last week when he had traveled to [place].  He developed some  constipation.  Several days later, he sort of forced himself to have a bowel  movement.  He has been moving his bowel since.  At approximately noon  yesterday, he developed the onset of bright red blood per rectum.  Relatively painless, but there has been some mild left lower quadrant  soreness and discomfort since then, and he has had five to seven episodes of  hematochezia.  There has been no melena.  He takes Plavix and aspirin since  a stroke in the spring.  He has never had problems like this before although  he has had bleeding hemorrhoids that he had surgery for 20 years ago.  He is  not lightheaded, weak or dizzy.  He is having no chest pain.  He is having  some indigestion and mild decrease in appetite, and some weight loss,  perceived by the need to tighten his belt one more notch over the last one  to two months.  He had a sigmoidoscopy or a colonoscopy 20 years ago, it  sounds like, but nothing in the interim.   MEDICATIONS:  1. Norvasc 10 mg daily.  2. Plavix 75 mg daily.  3. Captopril 20 mg daily (I think he is probably on enalapril at 20 mg each     day and not captopril.)  4. Aspirin 325 mg daily.   ALLERGIES:  CECLOR which caused a rash.   PAST MEDICAL HISTORY:  1. Hypertension.  2. Mild stroke in April of 2004 with residual  deficit of paresthesias in the     face and hand.  He had been seen by Dr. Sandria Manly.  3. Hemorrhoidectomy many years ago.  4. Tonsillectomy approximately 40 years ago.   FAMILY HISTORY:  No gastrointestinal problems.  No colon cancer.  His father  died in his 34's from prostate cancer.  His mother died of old age.   SOCIAL HISTORY:  The man is divorced.  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  He smokes about one pack per day, cutting back from three to two  packs per day previously.  He has a couple of drinks every night.   REVIEW OF SYSTEMS:  He wears  eye glasses.  Notable for paresthesia. Recent  upper respiratory infection.  All other systems are negative except for that  mentioned above.   PHYSICAL EXAMINATION:  GENERAL:  A pleasant, middle-age white man, in no  acute distress.  VITAL SIGNS:  Temperature 97.6, blood pressure 187/83, pulse 88,  respirations 14.  HEENT:  Eyes are anicteric.  Mouth is free of lesions.  NECK:  Supple without mass.  There is some shotty submandibular lymph nodes  palpable on the right.  LUNGS:  Coarse breath sounds throughout with good air movement.  HEART:  S1, S2, no rubs, murmurs or gallops.  ABDOMEN:  Scaphoid, soft, nontender.  Mild left-lower-quadrant tenderness  without organomegaly or mass.  Bowel sounds are present.  RECTAL:  Exam is not repeated.  Dr. Lyda Perone of the emergency department staff  performed that, and it showed gross blood.  LYMPH NODES:  No other neck or supraclavicular nodes other than the  submandibular nodes mentioned.  No inguinal nodes.  SKIN:  Somewhat tan.  No rash.  EXTREMITIES:  No edema.  NEUROLOGIC:  Appears alert and oriented x3.   LABORATORY DATA:  Hemoglobin 14.5, on the Zstat 13.8.  White count 11.3,  platelets count normal on CBC.  BUN 9, creatinine 0.6.  Potassium is 3.6.   ASSESSMENT:  Gastrointestinal bleeding, relatively painless.  This sounds  most like a diverticular bleeding.  He has got some recent  heartburn,  appetite change, and subjective weight loss (sounds like mild).   OTHER PROBLEMS:  1. Hypertension, previous stroke with Plavix and aspirin use, and prior     hemorrhoidectomy and tonsillectomy.  2. He is a smoker as well.   PLAN:  1. Admit.  2. Serial hemoglobins.  3. Eventually he will need a colonoscopy in the next 12 to 24 hours.  4. Need to consider an upper endoscopy at some point.  I do not think he is     having an upper GI bleed.  That could be elective.  5. Hold Plavix and aspirin for the time being.  6. We will continue his antihypertensives since he is hypertensive at this     time, but would hold them for hypotension.  7. Further plans pending clinical course.   I appreciate the opportunity to care for this patient.                                                Iva Boop, M.D. [facility]    CEG/MEDQ  D:  06/24/2003  T:  06/24/2003  Job:  [id number]   cc:   Windle Guard, M.D.  [address]  [place], [place] [address]  Fax: [phone]

## 2010-12-17 ENCOUNTER — Ambulatory Visit (INDEPENDENT_AMBULATORY_CARE_PROVIDER_SITE_OTHER): Payer: Medicare Other

## 2010-12-17 DIAGNOSIS — I7092 Chronic total occlusion of artery of the extremities: Secondary | ICD-10-CM

## 2010-12-17 NOTE — Assessment & Plan Note (Signed)
OFFICE VISIT  [name] [name], [name] [name] DOB:  [birth year]                                       12/17/2010 ZOXWR#:[id number]  HISTORY OF PRESENT ILLNESS:  The patient presents today for evaluation of a possible hernia.  The patient is status post left axillobifemoral bypass graft on July 24, 2010 by Dr. Edilia Bo.  The patient has done quite well since surgery and states that he has had no difficulty with his lower extremities.  At his first postoperative visit with Dr. Edilia Bo, the patient was again without complaint and denied claudication in the bilateral lower extremities.  On physical exam, his incision was healing well and his grafts patent.  The patient states that approximately 3-4 days ago he was helping his son-in-law move a refrigerator, at which time he felt a small popping sensation in his abdomen and then noticed a bulging in that area.  He presents today for evaluation of this, as he feels he may have developed a hernia.  The patient denies abdominal pain, nausea, vomiting, diarrhea, constipation, hematuria, and hematochezia.  He also denies fevers and chills, chest pain, and shortness of breath.  Past medical history, past surgical history, allergies, and medications are unchanged from previously documented H and P and discharge summary. Please refer to these in the chart.  PHYSICAL EXAMINATION:  In general, the patient is well-nourished in no acute distress.  HEENT:  Normocephalic, atraumatic.  PERRL, EOMI. Cardiac:  Regular rate rhythm.  Lungs:  Clear to auscultation.  Abdomen is soft, nontender, nondistended with active bowel sounds.  The patient does have 2 notable ventral hernias superior to umbilicus.  These are not confluent.  They are easily reducible.  The patient also has a rectus diastasis that is easily noted.  Along the lower portion of the patient's abdomen, there is laxity noted.  It is difficult to determine if this is in fact an  additional large hernia.  The 2 ventral hernias were easily noted superior to the umbilicus or midline and incisional from his previous abdominal exploration prior to ax bifem. Musculoskeletal:  No major deformities or cyanosis or noted.  The ax bifem is patent and easily palpable.  The bilateral lower extremities are warm and well-perfused.  ASSESSMENT: 1. Aortoiliac occlusive disease, status post left exploration of the     infrarenal aorta and left axillobifemoral bypass graft in February     2012 by Dr. Edilia Bo. 2. Multiple ventral hernias.  PLAN: 1. The patient is doing well from a vascular standpoint, and he will     keep his scheduled follow-up with Dr. Edilia Bo with vascular     studies. 2. The patient will be referred to general surgery for evaluation of     ventral hernias and possible repair.  Pecola Leisure, PA  V. Charlena Cross, MD Electronically Signed  AY/MEDQ  D:  12/17/2010  T:  12/17/2010  Job:  [id number]

## 2011-01-05 ENCOUNTER — Encounter (INDEPENDENT_AMBULATORY_CARE_PROVIDER_SITE_OTHER): Payer: Self-pay | Admitting: General Surgery

## 2011-01-05 ENCOUNTER — Ambulatory Visit (INDEPENDENT_AMBULATORY_CARE_PROVIDER_SITE_OTHER): Payer: Medicare Other | Admitting: General Surgery

## 2011-01-05 VITALS — BP 148/80 | HR 72 | Temp 96.6°F | Ht 69.0 in | Wt 142.2 lb

## 2011-01-05 DIAGNOSIS — K439 Ventral hernia without obstruction or gangrene: Secondary | ICD-10-CM

## 2011-01-05 NOTE — Progress Notes (Signed)
Subjective:     Patient ID: [name] [name], male   DOB: February 19, 1941, 70 y.o.   MRN: [id number]  HPI We are asked to see the patient by Dr. [name] to evaluate him for a ventral hernia. The patient is a 70 year old white male who underwent asked by Sam bypass grafting in February of this year. A couple months ago he was loading a refrigerator and felt a pull in his abdominal wall. Since then he has noticed some bulging at these sites. He denies any nausea or vomiting. He denies any chest pain or shortness of breath. He does note that prior to his bypass he was having diarrhea but since his bypass his bowels have been moving normally.  Review of Systems  Constitutional: Negative.   HENT: Negative.   Eyes: Negative.   Respiratory: Negative.   Cardiovascular: Negative.   Gastrointestinal: Positive for abdominal distention.  Genitourinary: Negative.   Musculoskeletal: Negative.   Neurological: Negative.   Hematological: Negative.   Psychiatric/Behavioral: Negative.    Past Medical History  Diagnosis Date  . Hypertension   . Hernia   . Stroke 2004    minor   Past Surgical History  Procedure Date  . Bypass graft 07/2010    legs   Current outpatient prescriptions:amLODipine (NORVASC) 10 MG tablet, Take 10 mg by mouth daily.  , Disp: , Rfl: ;  gabapentin (NEURONTIN) 600 MG tablet, Take 600 mg by mouth 2 (two) times daily.  , Disp: , Rfl: ;  hydrALAZINE (APRESOLINE) 25 MG tablet, Take 25 mg by mouth 3 (three) times daily.  , Disp: , Rfl: ;  losartan (COZAAR) 50 MG tablet, Take 50 mg by mouth 2 (two) times daily.  , Disp: , Rfl:  pravastatin (PRAVACHOL) 40 MG tablet, Take 40 mg by mouth daily.  , Disp: , Rfl: ;  traZODone (DESYREL) 150 MG tablet, Take 150 mg by mouth as needed.  , Disp: , Rfl:   No Known Allergies     Objective:   Physical Exam  Constitutional: He is oriented to person, place, and time. He appears well-developed and well-nourished.  HENT:  Head: Normocephalic and  atraumatic.  Eyes: Conjunctivae and EOM are normal. Pupils are equal, round, and reactive to light.  Neck: Normal range of motion. Neck supple.       Left carotid bruit.  Cardiovascular: Normal rate, regular rhythm and normal heart sounds.   Pulmonary/Chest: Effort normal and breath sounds normal.  Abdominal: Soft. Bowel sounds are normal.       Multiple fascial defects the largest being inferior. The lowest edge of the hernia is right at the crossing portion of the bypass graft.  Musculoskeletal: Normal range of motion.  Neurological: He is alert and oriented to person, place, and time.  Skin: Skin is warm and dry.  Psychiatric: He has a normal mood and affect. His behavior is normal.       Assessment:     This is a 70 year old white male with multiple ventral incisional hernias that are not incarcerated and with no sign of obstruction. He does have significant peripheral vascular disease and carotid disease.    Plan:     Because of the risk of incarceration and strangulation I think he would benefit from having his hernias fixed. I have discussed with him in detail the risk and benefits of the operation fix the hernias as well as some of the technical aspects and he understands and wishes to proceed. This includes the possibility  of injury to the bowel and injury to his bypass graft. We will plan to obtain cardiac clearance prior to scheduling his surgery. His cardiologist is Dr. Coralee Pesa.

## 2011-01-05 NOTE — Patient Instructions (Signed)
Will need cardiac clearance before scheduling surgery

## 2011-01-19 ENCOUNTER — Telehealth (INDEPENDENT_AMBULATORY_CARE_PROVIDER_SITE_OTHER): Payer: Self-pay | Admitting: General Surgery

## 2011-01-30 ENCOUNTER — Ambulatory Visit (HOSPITAL_COMMUNITY)
Admission: RE | Admit: 2011-01-30 | Discharge: 2011-01-30 | Disposition: A | Discharge status: Home / Self Care | Payer: Medicare Other | Source: Ambulatory Visit | Attending: General Surgery | Admitting: General Surgery

## 2011-01-30 ENCOUNTER — Other Ambulatory Visit (INDEPENDENT_AMBULATORY_CARE_PROVIDER_SITE_OTHER): Payer: Self-pay | Admitting: General Surgery

## 2011-01-30 ENCOUNTER — Encounter (HOSPITAL_COMMUNITY)
Admission: RE | Admit: 2011-01-30 | Discharge: 2011-01-30 | Disposition: A | Discharge status: Home / Self Care | Payer: Medicare Other | Source: Ambulatory Visit | Attending: General Surgery | Admitting: General Surgery

## 2011-01-30 DIAGNOSIS — Z01812 Encounter for preprocedural laboratory examination: Secondary | ICD-10-CM | POA: Insufficient documentation

## 2011-01-30 DIAGNOSIS — F17200 Nicotine dependence, unspecified, uncomplicated: Secondary | ICD-10-CM | POA: Insufficient documentation

## 2011-01-30 DIAGNOSIS — R52 Pain, unspecified: Secondary | ICD-10-CM

## 2011-01-30 DIAGNOSIS — Z01818 Encounter for other preprocedural examination: Secondary | ICD-10-CM | POA: Insufficient documentation

## 2011-01-30 LAB — DIFFERENTIAL
Basophils Absolute: 0 10*3/uL (ref 0.0–0.1)
Eosinophils Absolute: 0.1 10*3/uL (ref 0.0–0.7)
Eosinophils Relative: 1 % (ref 0–5)
Lymphocytes Relative: 15 % (ref 12–46)
Neutrophils Relative %: 73 % (ref 43–77)

## 2011-01-30 LAB — BASIC METABOLIC PANEL
BUN: 6 mg/dL (ref 6–23)
Chloride: 92 mEq/L — ABNORMAL LOW (ref 96–112)
GFR calc Af Amer: >60 mL/min (ref >60)
Glucose, Bld: 95 mg/dL (ref 70–99)
Potassium: 3.7 mEq/L (ref 3.5–5.1)
Sodium: 134 mEq/L — ABNORMAL LOW (ref 135–145)

## 2011-01-30 LAB — SURGICAL PCR SCREEN: Staphylococcus aureus: NEGATIVE

## 2011-01-30 LAB — CBC
Platelets: 206 10*3/uL (ref 150–400)
RDW: 14.1 % (ref 11.5–15.5)
WBC: 11.9 10*3/uL — ABNORMAL HIGH (ref 4.0–10.5)

## 2011-02-02 ENCOUNTER — Encounter: Payer: Self-pay | Admitting: Vascular Surgery

## 2011-02-04 ENCOUNTER — Telehealth (INDEPENDENT_AMBULATORY_CARE_PROVIDER_SITE_OTHER): Payer: Self-pay | Admitting: General Surgery

## 2011-02-04 ENCOUNTER — Encounter (INDEPENDENT_AMBULATORY_CARE_PROVIDER_SITE_OTHER): Payer: Self-pay | Admitting: General Surgery

## 2011-02-09 ENCOUNTER — Inpatient Hospital Stay (HOSPITAL_COMMUNITY)
Admission: RE | Admit: 2011-02-09 | Discharge: 2011-02-24 | DRG: 353 | Disposition: A | Discharge status: Home / Self Care | Payer: Medicare Other | Source: Ambulatory Visit | Attending: General Surgery | Admitting: General Surgery

## 2011-02-09 ENCOUNTER — Other Ambulatory Visit (INDEPENDENT_AMBULATORY_CARE_PROVIDER_SITE_OTHER): Payer: Self-pay | Admitting: General Surgery

## 2011-02-09 DIAGNOSIS — F17200 Nicotine dependence, unspecified, uncomplicated: Secondary | ICD-10-CM | POA: Diagnosis present

## 2011-02-09 DIAGNOSIS — J189 Pneumonia, unspecified organism: Secondary | ICD-10-CM | POA: Diagnosis not present

## 2011-02-09 DIAGNOSIS — K436 Other and unspecified ventral hernia with obstruction, without gangrene: Secondary | ICD-10-CM

## 2011-02-09 DIAGNOSIS — K560 Paralytic ileus: Secondary | ICD-10-CM | POA: Diagnosis not present

## 2011-02-09 DIAGNOSIS — I10 Essential (primary) hypertension: Secondary | ICD-10-CM | POA: Diagnosis present

## 2011-02-09 DIAGNOSIS — R911 Solitary pulmonary nodule: Secondary | ICD-10-CM | POA: Diagnosis present

## 2011-02-09 DIAGNOSIS — K439 Ventral hernia without obstruction or gangrene: Principal | ICD-10-CM | POA: Diagnosis present

## 2011-02-09 DIAGNOSIS — E876 Hypokalemia: Secondary | ICD-10-CM | POA: Diagnosis present

## 2011-02-09 HISTORY — PX: HERNIA REPAIR: SHX51

## 2011-02-12 ENCOUNTER — Inpatient Hospital Stay (HOSPITAL_COMMUNITY): Payer: Medicare Other

## 2011-02-12 LAB — DIFFERENTIAL
Lymphocytes Relative: 3 % — ABNORMAL LOW (ref 12–46)
Lymphs Abs: 0.7 10*3/uL (ref 0.7–4.0)
Monocytes Absolute: 1.4 10*3/uL — ABNORMAL HIGH (ref 0.1–1.0)
Monocytes Relative: 7 % (ref 3–12)
Neutro Abs: 18.6 10*3/uL — ABNORMAL HIGH (ref 1.7–7.7)

## 2011-02-12 LAB — BASIC METABOLIC PANEL
BUN: 16 mg/dL (ref 6–23)
CO2: 30 mEq/L (ref 19–32)
Calcium: 10.1 mg/dL (ref 8.4–10.5)
GFR calc non Af Amer: >60 mL/min (ref >60)
Glucose, Bld: 133 mg/dL — ABNORMAL HIGH (ref 70–99)

## 2011-02-12 LAB — CBC
HCT: 41.4 % (ref 39.0–52.0)
Hemoglobin: 14.2 g/dL (ref 13.0–17.0)
MCH: 31.5 pg (ref 26.0–34.0)
MCHC: 34.3 g/dL (ref 30.0–36.0)
MCV: 91.8 fL (ref 78.0–100.0)
Platelets: 249 10*3/uL (ref 150–400)
RBC: 4.51 MIL/uL (ref 4.22–5.81)
RDW: 13.8 % (ref 11.5–15.5)
WBC: 20.8 10*3/uL — ABNORMAL HIGH (ref 4.0–10.5)

## 2011-02-12 NOTE — Telephone Encounter (Signed)
No reason for call at this time

## 2011-02-12 NOTE — Telephone Encounter (Signed)
Reason for call unavailable

## 2011-02-13 DIAGNOSIS — M79609 Pain in unspecified limb: Secondary | ICD-10-CM

## 2011-02-14 ENCOUNTER — Inpatient Hospital Stay (HOSPITAL_COMMUNITY): Payer: Medicare Other

## 2011-02-14 LAB — CBC
HCT: 36.5 % — ABNORMAL LOW (ref 39.0–52.0)
MCH: 31.5 pg (ref 26.0–34.0)
MCHC: 34.8 g/dL (ref 30.0–36.0)
RDW: 13.8 % (ref 11.5–15.5)

## 2011-02-14 LAB — DIFFERENTIAL
Basophils Absolute: 0 10*3/uL (ref 0.0–0.1)
Basophils Relative: 0 % (ref 0–1)
Eosinophils Relative: 1 % (ref 0–5)
Monocytes Absolute: 1.9 10*3/uL — ABNORMAL HIGH (ref 0.1–1.0)
Monocytes Relative: 14 % — ABNORMAL HIGH (ref 3–12)

## 2011-02-14 LAB — BASIC METABOLIC PANEL
BUN: 28 mg/dL — ABNORMAL HIGH (ref 6–23)
Calcium: 8.9 mg/dL (ref 8.4–10.5)
GFR calc Af Amer: >60 mL/min (ref >60)
GFR calc non Af Amer: >60 mL/min (ref >60)
Potassium: 3.7 mEq/L (ref 3.5–5.1)
Sodium: 130 mEq/L — ABNORMAL LOW (ref 135–145)

## 2011-02-15 ENCOUNTER — Inpatient Hospital Stay (HOSPITAL_COMMUNITY): Payer: Medicare Other

## 2011-02-15 LAB — CBC
HCT: 36.2 % — ABNORMAL LOW (ref 39.0–52.0)
MCHC: 34.3 g/dL (ref 30.0–36.0)
Platelets: 298 10*3/uL (ref 150–400)
RDW: 13.7 % (ref 11.5–15.5)
WBC: 10.6 10*3/uL — ABNORMAL HIGH (ref 4.0–10.5)

## 2011-02-15 LAB — DIFFERENTIAL
Basophils Absolute: 0 10*3/uL (ref 0.0–0.1)
Basophils Relative: 0 % (ref 0–1)
Eosinophils Relative: 1 % (ref 0–5)
Lymphocytes Relative: 14 % (ref 12–46)
Monocytes Absolute: 1.8 10*3/uL — ABNORMAL HIGH (ref 0.1–1.0)

## 2011-02-15 LAB — BASIC METABOLIC PANEL
Chloride: 98 mEq/L (ref 96–112)
GFR calc Af Amer: >60 mL/min (ref >60)
GFR calc non Af Amer: >60 mL/min (ref >60)
Potassium: 3.4 mEq/L — ABNORMAL LOW (ref 3.5–5.1)
Sodium: 136 mEq/L (ref 135–145)

## 2011-02-16 ENCOUNTER — Inpatient Hospital Stay (HOSPITAL_COMMUNITY): Payer: Medicare Other

## 2011-02-16 ENCOUNTER — Encounter (HOSPITAL_COMMUNITY): Payer: Self-pay | Admitting: Radiology

## 2011-02-16 DIAGNOSIS — R0902 Hypoxemia: Secondary | ICD-10-CM

## 2011-02-16 DIAGNOSIS — J449 Chronic obstructive pulmonary disease, unspecified: Secondary | ICD-10-CM

## 2011-02-16 LAB — CBC
HCT: 38 % — ABNORMAL LOW (ref 39.0–52.0)
MCV: 92.5 fL (ref 78.0–100.0)
RBC: 4.11 MIL/uL — ABNORMAL LOW (ref 4.22–5.81)
RDW: 13.7 % (ref 11.5–15.5)
WBC: 11.9 10*3/uL — ABNORMAL HIGH (ref 4.0–10.5)

## 2011-02-16 LAB — DIFFERENTIAL
Basophils Absolute: 0.1 10*3/uL (ref 0.0–0.1)
Eosinophils Relative: 2 % (ref 0–5)
Lymphocytes Relative: 7 % — ABNORMAL LOW (ref 12–46)
Lymphs Abs: 0.8 10*3/uL (ref 0.7–4.0)
Neutro Abs: 8.7 10*3/uL — ABNORMAL HIGH (ref 1.7–7.7)
Neutrophils Relative %: 73 % (ref 43–77)

## 2011-02-16 LAB — BASIC METABOLIC PANEL
BUN: 16 mg/dL (ref 6–23)
CO2: 28 mEq/L (ref 19–32)
Chloride: 97 mEq/L (ref 96–112)
Creatinine, Ser: 0.81 mg/dL (ref 0.50–1.35)
GFR calc Af Amer: >60 mL/min (ref >60)
Potassium: 4 mEq/L (ref 3.5–5.1)

## 2011-02-17 NOTE — Op Note (Signed)
NAME:  [name] [name], [name] [name] NO.:  [id number]  MEDICAL RECORD NO.:  [id number]  LOCATION:  5149                         FACILITY:  [facility]  PHYSICIAN:  Ollen Gross. Vernell Morgans, M.D. DATE OF BIRTH:  April 15, 1941  DATE OF PROCEDURE:  02/09/2011 DATE OF DISCHARGE:                              OPERATIVE REPORT   PREOPERATIVE DIAGNOSIS:  Ventral hernia.  POSTOPERATIVE DIAGNOSIS:  Ventral hernia.  PROCEDURE:  Laparoscopic-assisted ventral hernia repair with mesh and lysis of adhesions.  SURGEON:  Ollen Gross. Vernell Morgans, MD  ASSISTANT:  Dr. Biagio Quint.  ANESTHESIA:  General endotracheal.  PROCEDURE:  After informed consent was obtained, the patient was brought to the operating room and placed in supine position on the operating room table.  After adequate induction of general anesthesia, the patient's abdomen was prepped with ChloraPrep, allowed to dry and draped in usual sterile manner including the use of an [place].  The site was chosen to try to access the abdomen of the right upper quadrant with a 5- mm Optiview.  This area was infiltrated with 0.25% Marcaine.  A small incision was made with #15 blade knife.  The Optiview was used to dissect through the layers of the abdominal wall bluntly under direct vision until we did gain access to the abdominal cavity.  The trocar was then removed and the abdomen was insufflated with carbon dioxide without difficulty.  The laparoscope was then placed through the port and the abdomen was relatively free except for the upper abdomen had some filmy omental adhesions to the anterior abdominal wall.  A site was chosen of the right mid to lower abdomen for placement of a 5-mm port.  This area was infiltrated with 0.25% Marcaine.  A small stab incision was made with #15 blade knife and 5-mm port was placed bluntly through this incision into the abdominal cavity under direct vision.  Using the harmonic scalpel, the filmy adhesions were taken down  sharply.  This is along with the falciform ligament.  This allowed good visualization of the anterior abdominal wall.  The upper abdomen had a couple very small Swiss cheese like defects.  The lower abdominal wall had a much larger defect.  The edge of the defect were defined and the length of the defect was measured.  We chose a 20 x 25 piece of Physiomesh.  The total top-to-bottom length was left intact.  The side-to-side length was trimmed about a centimeter on each side.  Eight #1 Novafil stitches were placed at equidistant points around the periphery of the mesh.  At this point, we wanted to close the fascial defect of the lower abdominal wall.  We made a small incision with a #10 blade knife overlying the defect.  Care was taken to avoid his ax bi-fem graft.  Once the incision was made, we dissected through the subcutaneous tissue sharply with the electrocautery until we entered the abdominal cavity.  The hernia sac was excised sharply with the electrocautery.  The fascia was identified. The fascial defect was closed with interrupted #1 Novafil stitches. Prior to closing the fascia, we did place the mesh inside the abdomen, oriented appropriately.  The fascia was then closed  and the stitches were tied.  We then reinsufflated the abdomen and we made 8 small stab incisions corresponding to the locations of each stitch.  Suture passer was used at each of these sites to pass through the abdominal wall and grasp the tails of each stitch.  These were brought through the abdominal wall and then held with hemostats.  Once all the stitches were passed, we pulled the mesh up against the abdominal wall and it had good approximation to the abdominal wall without any redundancy or gaps. Each of the stitches were then tied down and the tails were removed. Again care was taken to avoid the ax bi-fem graft.  Once this was accomplished, we then used a absorbable tacker and placed a layer  of absorbable tacks in between each of the stitches circumferentially around the mesh.  In order to do this, we did have to put a 5-mm port on the left mid abdomen and again care was taken to avoid the ax bi-fem graft and an area was chosen for this port.  It was infiltrated with 0.25% Marcaine.  A small stab incision was made with a #15 blade knife and a 5-mm port was placed bluntly through this incision in the abdominal cavity under direct vision.  Once the tacks were all in place, the abdomen was inspected.  The bowel looked healthy.  Everything looked hemostatic.  The mesh appeared to be in good position.  We then desufflated the abdomen and visualized the mesh to remain in good approximation to the abdominal wall.  The ports were then removed.  The incision was then all closed with staples.  Sterile dressings were then applied along with a Kerlix fluff and abdominal binder.  The patient tolerated the procedure well.  At the end of the case, all needle, sponge, and instrument counts were correct.  The patient was then awakened and taken to recovery room in stable condition.     Ollen Gross. Vernell Morgans, M.D.     PST/MEDQ  D:  02/09/2011  T:  02/09/2011  Job:  [id number]  Electronically Signed by Chevis Pretty III M.D. on 02/17/2011 07:59:16 AM

## 2011-02-18 ENCOUNTER — Ambulatory Visit: Payer: Medicare Other | Admitting: Vascular Surgery

## 2011-02-18 ENCOUNTER — Inpatient Hospital Stay (HOSPITAL_COMMUNITY): Payer: Medicare Other

## 2011-02-18 DIAGNOSIS — R0902 Hypoxemia: Secondary | ICD-10-CM

## 2011-02-18 LAB — CBC
HCT: 35.4 % — ABNORMAL LOW (ref 39.0–52.0)
MCH: 30.9 pg (ref 26.0–34.0)
MCV: 91.2 fL (ref 78.0–100.0)
RBC: 3.88 MIL/uL — ABNORMAL LOW (ref 4.22–5.81)
WBC: 12.9 10*3/uL — ABNORMAL HIGH (ref 4.0–10.5)

## 2011-02-18 LAB — DIFFERENTIAL
Eosinophils Relative: 5 % (ref 0–5)
Lymphocytes Relative: 12 % (ref 12–46)
Lymphs Abs: 1.5 10*3/uL (ref 0.7–4.0)
Monocytes Relative: 12 % (ref 3–12)
Neutrophils Relative %: 72 % (ref 43–77)

## 2011-02-18 LAB — BASIC METABOLIC PANEL
BUN: 6 mg/dL (ref 6–23)
CO2: 27 mEq/L (ref 19–32)
Calcium: 7.9 mg/dL — ABNORMAL LOW (ref 8.4–10.5)
Chloride: 96 mEq/L (ref 96–112)
Creatinine, Ser: 0.61 mg/dL (ref 0.50–1.35)

## 2011-02-18 NOTE — Consult Note (Addendum)
NAME:  [name] [name], PENNISI NO.:  [id number]  MEDICAL RECORD NO.:  [id number]  LOCATION:  5149                         FACILITY:  [facility]  PHYSICIAN:  Leslye Peer, MD    DATE OF BIRTH:  1940/09/17  DATE OF CONSULTATION:  02/16/2011 DATE OF DISCHARGE:                                CONSULTATION   CONSULTING PHYSICIAN:  Ollen Gross. Vernell Morgans, MD, from [facility].  PRIMARY CARE PHYSICIAN:  Windle Guard, MD  REASON FOR CONSULTATION:  COPD/pulmonary nodules.   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  CT abd/pelvis evaluation postoperativly  demonstrated lower lobe atelectasis/infiltrate as well as O2 requirements.   In February 2012, during workup for aortofem bypass it was noted at that time on CT of the abdomen and pelvis that he had a 4 mm right lower lobe pulmonary nodule which is unchanged on current CT scan, however, there is an additional 4 mm pulmonary nodule on the right lower lobe.  His postoperative course was complicated by nausea and vomiting.  Pulmonary and Critical Care Medicine was consulted for concern of left pneumonia, COPD, pulmonary nodules.  ALLERGIES:  [name] [name] is allergic to CEPHALOSPORINS which causes a rash and facial swelling.  PAST MEDICAL HISTORY: 1. Hypertension. 2. COPD (presumed). 3. 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  Paresthesias in the face     and hand. 4. Hemorrhoidectomy. 5. Tonsillectomy. 6. 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  [name] [name] previously has listed a history of diabetes, MI and congestive heart failure.  However, he denies these with medical history.  HOME  MEDICATIONS: 1. KCl. 2. Vitamin C. 3. Trazodone 150 mg half tablet p.o. nightly p.r.n. insomnia. 4. Pravastatin. 5. Amlodipine. 6. Losartan.  SOCIAL HISTORY:  [name] [name] is divorced and has 2 children.  He lives in [place] alone.  He is a Curator.  He is also a current smoker and his widest was a one-pack per day smoker and heavy as 2 packs per day for 49 years.  He drinks 2 to 3 beers daily and on the weekend may have a mixed drink.  FAMILY HISTORY:  His mother died at age 57 with Alzheimer disease and his father passed at 33 with prostate cancer.  REVIEW OF SYSTEMS:  Code status, he is a full code.   GENERAL:  Denies fevers and chills.   HEENT:  Denies headaches, nasal discharge.   SKIN: Denies rashes or lesions.   PULMONARY:  Denies shortness of breath, dyspnea on exertion, cough, chest pain, wheezing, palpitations, syncope. With encouraged coughing, he does produce a small amount of clear to white secretions.   GI/GU:  Denies frequency, urgency, dysuria.  Does endorse nausea,  vomiting and diarrhea after 2 suppositories.  He deny hematemesis, GERD symptoms and is status post ventral hernia repair. ENDOCRINE:  Denies heat or cold intolerance.  All other systems reviewed and are negative.  PHYSICAL EXAMINATION:  VITAL SIGNS:  Temperature 97.8, blood pressure 153/86, rate 93, respirations 18, oxygen saturation 96% on 4 L, post flutter valve use saturations come up to 100%. GENERAL:  This is a well-developed, well-nourished adult male in no acute distress. NEURO:  He is awake, alert and oriented.  Cranial nerves II-XII are grossly intact.  Strength is 5/5 in all extremities.  Speech is clear. HEENT:  Normocephalic, atraumatic.  Pupils are equal, round and reactive.  EOMs are intact.  Sclerae are clear.  Nares are patent without discharge.  Mucous membranes are pink and moist with fair dentition. NECK:  Supple without lymphadenopathy or thyromegaly.  No bruits or  JVD noted. CARDIOVASCULAR:  S1 and S2, regular rate and rhythm.  No murmurs, rubs or gallops.  Normal pulses radially and are +2. PULMONARY:  Respirations are even and unlabored on 4 L per nasal cannula.  Lungs are essentially clear bilaterally. GI:  Abdomen is soft.  He does have an abdominal binder in place with an NG tube in his left naris. MUSCULOSKELETAL:  He is moving all extremities without difficulty.  No obvious joint deformity. SKIN:  No rashes or lesions.  RADIOLOGY:  On August 26, chest x-ray demonstrates mild lower atelectasis bilaterally and changes consistent with COPD. On August 27, CT of the abdomen and pelvis demonstrates left lower lobe atelectasis/infiltrate tiny right effusion with 2 pulmonary nodules, both 4 mm in size,  1 with known from previous scan in February 2012 and the other appears to be new.  LABORATORY DATA:  BMP demonstrates sodium 132, potassium 4.0, chloride 97, bicarbonate 28, BUN 16, creatinine 0.81, glucose 117.  CBC demonstrates WBC 11.9, platelets 352 with 73% neutrophils, hemoglobin 12.9, hematocrit 38.0.  ASSESSMENT AND PLAN: 1. Right lower lobe pulmonary nodules x2.  Mr. Brunton does have a known     history of a 4 mm pulmonary nodules seen on CT of the abdomen and     pelvis on July 23, 2010.  This appears to be unchanged on     current CT abdomen and pelvis.  However he does have a new 4-mm     nodule on August 27 CT of the abdomen and pelvis and it is     recommended at that time that he is to follow up with [facility] as an outpatient for serial CT scans for nodule     following. 2. Left lower lobe pneumonia/atelectasis.  It is recommended that he     continue doxycycline to complete 7 days of antibiotics total.     Likely this is more atelectasis and not actual acute infection.  He     is recommended to continue push pulmonary hygiene.  Mr. Marczak was     instructed on how to use flutter valve at bedside and instructed  to     use at least q.2 h. while awake with coughing post flutter valve     use. 3. Presumed chronic obstructive pulmonary disease.  Mr. Tabbert does     have a known history of 4-9 years of 1 to 2 packs per day smoking.     He is recommended at this time that again he followup with Dr.     Delton Coombes at [facility] for full PFTs for COPD stating as well     as he will be given albuterol MDI and is recommended he has 1  postdischarge for home use.  He will be instructed on how to use     inhaler per respiratory therapy.  It is recommended to continue to     wean O2 for saturations greater than 90%. 4. Status post ventral hernia repair.  It is highly likely that     abdominal pain and abdominal binder are contributing to atelectasis     and bilateral lower atelectasis.  Also plus or minus aspiration     with recurrent nausea and vomiting.  Further recommendations for     surgery in regards to postop care.  Thank you for this consultation.  We will continue to follow along with you.     Canary Brim, NP   ______________________________ Leslye Peer, MD    BO/MEDQ  D:  02/16/2011  T:  02/16/2011  Job:  [id number]  Electronically Signed by Canary Brim  on 02/18/2011 02:32:08 PM Electronically Signed by Levy Pupa MD on 03/01/2011 04:20:50 PM

## 2011-02-19 LAB — DIFFERENTIAL
Basophils Relative: 0 % (ref 0–1)
Eosinophils Relative: 2 % (ref 0–5)
Lymphs Abs: 1.8 10*3/uL (ref 0.7–4.0)
Monocytes Absolute: 2.1 10*3/uL — ABNORMAL HIGH (ref 0.1–1.0)
Neutro Abs: 15.2 10*3/uL — ABNORMAL HIGH (ref 1.7–7.7)

## 2011-02-19 LAB — CBC
HCT: 41.9 % (ref 39.0–52.0)
MCH: 31.3 pg (ref 26.0–34.0)
MCHC: 34.8 g/dL (ref 30.0–36.0)
MCV: 89.7 fL (ref 78.0–100.0)
RDW: 13.2 % (ref 11.5–15.5)

## 2011-02-19 LAB — BASIC METABOLIC PANEL
BUN: 7 mg/dL (ref 6–23)
CO2: 30 mEq/L (ref 19–32)
Calcium: 8.4 mg/dL (ref 8.4–10.5)
GFR calc non Af Amer: >60 mL/min (ref >60)
Glucose, Bld: 126 mg/dL — ABNORMAL HIGH (ref 70–99)
Potassium: 3.5 mEq/L (ref 3.5–5.1)

## 2011-02-20 ENCOUNTER — Inpatient Hospital Stay (HOSPITAL_COMMUNITY): Payer: Medicare Other

## 2011-02-20 LAB — DIFFERENTIAL
Basophils Absolute: 0.1 10*3/uL (ref 0.0–0.1)
Basophils Relative: 1 % (ref 0–1)
Lymphs Abs: 1.9 10*3/uL (ref 0.7–4.0)
Monocytes Relative: 14 % — ABNORMAL HIGH (ref 3–12)
Neutro Abs: 9.1 10*3/uL — ABNORMAL HIGH (ref 1.7–7.7)

## 2011-02-20 LAB — BASIC METABOLIC PANEL
BUN: 8 mg/dL (ref 6–23)
Calcium: 7.5 mg/dL — ABNORMAL LOW (ref 8.4–10.5)
Creatinine, Ser: 0.58 mg/dL (ref 0.50–1.35)
GFR calc Af Amer: >60 mL/min (ref >60)
GFR calc non Af Amer: >60 mL/min (ref >60)
Glucose, Bld: 114 mg/dL — ABNORMAL HIGH (ref 70–99)
Potassium: 3 mEq/L — ABNORMAL LOW (ref 3.5–5.1)

## 2011-02-20 LAB — CBC
HCT: 38.9 % — ABNORMAL LOW (ref 39.0–52.0)
Hemoglobin: 13.3 g/dL (ref 13.0–17.0)
MCH: 30.9 pg (ref 26.0–34.0)
MCHC: 34.2 g/dL (ref 30.0–36.0)
MCV: 90.3 fL (ref 78.0–100.0)
RDW: 13.5 % (ref 11.5–15.5)

## 2011-03-04 ENCOUNTER — Ambulatory Visit (INDEPENDENT_AMBULATORY_CARE_PROVIDER_SITE_OTHER): Payer: Medicare Other | Admitting: Emergency Medicine

## 2011-03-04 ENCOUNTER — Encounter: Payer: Self-pay | Admitting: Emergency Medicine

## 2011-03-04 DIAGNOSIS — R918 Other nonspecific abnormal finding of lung field: Secondary | ICD-10-CM | POA: Insufficient documentation

## 2011-03-04 DIAGNOSIS — F17200 Nicotine dependence, unspecified, uncomplicated: Secondary | ICD-10-CM

## 2011-03-04 DIAGNOSIS — Z720 Tobacco use: Secondary | ICD-10-CM

## 2011-03-04 NOTE — Patient Instructions (Signed)
Follow up with Dr Delton Coombes in 3 months We will do full PFT's at your next appointment We will schedule a CT scan of the chest in April 2013

## 2011-03-04 NOTE — Assessment & Plan Note (Signed)
Need repeat CT scan chest in April 2013

## 2011-03-04 NOTE — Progress Notes (Signed)
Subjective:    Patient ID: [name] [name], male    DOB: 1941/06/07, 70 y.o.   MRN: [id number]  HPI 70 yo smoker (80 + pk-yrs), hx PVD, recent admission for ventral hernia repair.  Has CXR and Abd CT with LLL atx vs PNA, treated w abx. Also with 2 small 4mm nodules. Here for f/u. Has been doing well, no dyspnea, abd incision doing well.    Review of Systems  Constitutional: Positive for activity change. Negative for fever, chills, diaphoresis, appetite change and fatigue.  HENT: Positive for congestion, rhinorrhea and postnasal drip. Negative for nosebleeds and sneezing.   Respiratory: Positive for cough (coughs in the am, sinus drainage). Negative for chest tightness, shortness of breath, wheezing and stridor.   Cardiovascular: Negative for chest pain.  Gastrointestinal: Positive for abdominal pain.      Past Medical History  Diagnosis Date  . Hypertension   . Hernia   . Stroke 2004    minor  . Hyperlipidemia   . Peripheral vascular disease      Family History  Problem Relation Age of Onset  . Alzheimer's disease Mother   . Cancer Father     prostate  . Cancer Brother     prostate  . Stroke Brother      History   Social History  . Marital Status: Single    Spouse Name: N/A    Number of Children: N/A  . Years of Education: N/A   Occupational History  . Not on file.   Social History Main Topics  . Smoking status: Current Everyday Smoker -- 2.0 packs/day for 50 years    Types: Cigarettes  . Smokeless tobacco: Not on file   Comment: pt says he is down to 4-5 cigarettes a week  . Alcohol Use: Yes     2 drinks per day  . Drug Use: No  . Sexually Active: Not on file   Other Topics Concern  . Not on file   Social History Narrative  . No narrative on file     Allergies  Allergen Reactions  . Ceclor (Cefaclor)      Outpatient Prescriptions Prior to Visit  Medication Sig Dispense Refill  . amLODipine (NORVASC) 10 MG tablet Take 10 mg by mouth daily.         Marland Kitchen gabapentin (NEURONTIN) 600 MG tablet Take 600 mg by mouth 2 (two) times daily.        . hydrALAZINE (APRESOLINE) 25 MG tablet Take 25 mg by mouth 3 (three) times daily.        Marland Kitchen losartan (COZAAR) 50 MG tablet Take 50 mg by mouth 2 (two) times daily.        . pravastatin (PRAVACHOL) 40 MG tablet Take 40 mg by mouth daily.        . traZODone (DESYREL) 150 MG tablet Take 150 mg by mouth as needed.               Objective:   Physical Exam  Gen: Pleasant, well-nourished, in no distress,  normal affect  ENT: No lesions,  mouth clear,  oropharynx clear, no postnasal drip  Neck: No JVD, no TMG, no carotid bruits  Lungs: No use of accessory muscles, no dullness to percussion, clear without rales or rhonchi  Cardiovascular: RRR, heart sounds normal, no murmur or gallops, no peripheral edema  Abdomen: has a binder on, no evidence dihissance  Musculoskeletal: No deformities, no cyanosis or clubbing  Neuro: alert, non focal  Skin:  Warm, no lesions or rashes     Assessment & Plan:  Tobacco abuse Asymptomatic. Wants to quit. We discussed cessation strategies today - full PFT  Pulmonary nodules Need repeat CT scan chest in April 2013

## 2011-03-04 NOTE — Assessment & Plan Note (Signed)
Asymptomatic. Wants to quit. We discussed cessation strategies today - full PFT

## 2011-03-16 ENCOUNTER — Encounter (INDEPENDENT_AMBULATORY_CARE_PROVIDER_SITE_OTHER): Payer: Medicare Other | Admitting: General Surgery

## 2011-03-17 ENCOUNTER — Ambulatory Visit (INDEPENDENT_AMBULATORY_CARE_PROVIDER_SITE_OTHER): Payer: Medicare Other | Admitting: General Surgery

## 2011-03-17 ENCOUNTER — Encounter (INDEPENDENT_AMBULATORY_CARE_PROVIDER_SITE_OTHER): Payer: Self-pay | Admitting: General Surgery

## 2011-03-17 VITALS — BP 136/64 | HR 68 | Temp 97.9°F | Resp 18 | Ht 69.0 in | Wt 133.0 lb

## 2011-03-17 DIAGNOSIS — K439 Ventral hernia without obstruction or gangrene: Secondary | ICD-10-CM

## 2011-03-17 NOTE — Patient Instructions (Signed)
No heavy lifting 

## 2011-03-17 NOTE — Progress Notes (Signed)
Subjective:     Patient ID: ARDIAN HABERLAND, male   DOB: [birth year], 70 y.o.   MRN: [id number]  HPI The patient is a 70 year old white male who is now just over a month out from a laparoscopic ventral hernia repair with mesh. His postoperative course was complicated by a prolonged ileus. He is now at home and doing much better. He has some minor shortness laterally on his abdomen when he moves. His appetite is improving. His bowels are moving normally.  Review of Systems     Objective:   Physical Exam On exam his abdomen is soft and nontender. His incisions are all healing nicely. There is no evidence of infection. His hernia appears to be well repaired.    Assessment:     One month postop from a laparoscopic ventral hernia repair with mesh    Plan:     At this point I would like him to continue to refrain from real strenuous activity for another 2-4 weeks. We will plan to see him back in about another month check his progress. He agrees to call us if he has any problems in the meantime.

## 2011-03-25 ENCOUNTER — Ambulatory Visit: Payer: Self-pay | Admitting: Vascular Surgery

## 2011-03-25 ENCOUNTER — Other Ambulatory Visit: Payer: Self-pay

## 2011-04-06 LAB — COMPREHENSIVE METABOLIC PANEL
ALT: 15
Albumin: 3.7
Alkaline Phosphatase: 64
Calcium: 9.1
GFR calc Af Amer: >60
Glucose, Bld: 90
Potassium: 4
Sodium: 133 — ABNORMAL LOW
Total Protein: 7.1

## 2011-04-06 LAB — PROTIME-INR: Prothrombin Time: 14.9

## 2011-04-06 LAB — CBC
MCHC: 34.1
Platelets: 317
RDW: 13.9

## 2011-04-07 ENCOUNTER — Other Ambulatory Visit: Payer: Self-pay | Admitting: Internal Medicine

## 2011-04-07 ENCOUNTER — Ambulatory Visit
Admission: RE | Admit: 2011-04-07 | Discharge: 2011-04-07 | Disposition: A | Discharge status: Home / Self Care | Payer: Medicare Other | Source: Ambulatory Visit | Attending: Internal Medicine | Admitting: Internal Medicine

## 2011-04-07 DIAGNOSIS — M25512 Pain in left shoulder: Secondary | ICD-10-CM

## 2011-04-09 LAB — BASIC METABOLIC PANEL
Calcium: 9.5
Chloride: 97
Creatinine, Ser: 0.86
GFR calc Af Amer: >60
Sodium: 135

## 2011-04-09 LAB — URINALYSIS, ROUTINE W REFLEX MICROSCOPIC
Glucose, UA: NEGATIVE
Hgb urine dipstick: NEGATIVE
Protein, ur: NEGATIVE
Specific Gravity, Urine: 1.012
Urobilinogen, UA: 0.2

## 2011-04-09 LAB — APTT: aPTT: 32

## 2011-04-09 LAB — HEMOGLOBIN AND HEMATOCRIT, BLOOD
HCT: 46.4
Hemoglobin: 16.3

## 2011-04-20 ENCOUNTER — Encounter (INDEPENDENT_AMBULATORY_CARE_PROVIDER_SITE_OTHER): Payer: Self-pay | Admitting: General Surgery

## 2011-04-20 ENCOUNTER — Ambulatory Visit (INDEPENDENT_AMBULATORY_CARE_PROVIDER_SITE_OTHER): Payer: Medicare Other | Admitting: General Surgery

## 2011-04-20 VITALS — BP 140/68 | HR 66 | Temp 97.8°F | Resp 98 | Ht 69.0 in | Wt 136.2 lb

## 2011-04-20 DIAGNOSIS — K439 Ventral hernia without obstruction or gangrene: Secondary | ICD-10-CM

## 2011-04-20 NOTE — Patient Instructions (Signed)
May return to all normal activities 

## 2011-04-21 ENCOUNTER — Encounter: Payer: Self-pay | Admitting: Vascular Surgery

## 2011-04-21 NOTE — Progress Notes (Signed)
Subjective:     Patient ID: [name] [name], male   DOB: 10-23-1940, 70 y.o.   MRN: [id number]  HPI The patient is a 70 year old white male who is now 2 months out from a laparoscopic ventral hernia repair with mesh. His postoperative course was complicated by persistent ileus. This has now resolved. He feels good and has no complaints today. His appetite is good and his bowels are working normally. Review of Systems     Objective:   Physical Exam On exam his abdomen is soft and nontender. His incisions are well healed nicely. His abdominal wall feels solid. There is no palpable evidence of hernia.    Assessment:     2 months status post laparoscopic ventral hernia repair with mesh    Plan:     At this point I believe he can return all his normal activities without any restrictions. We will plan to see him back on a p.r.n. basis

## 2011-04-22 ENCOUNTER — Encounter: Payer: Self-pay | Admitting: Vascular Surgery

## 2011-04-22 ENCOUNTER — Ambulatory Visit (INDEPENDENT_AMBULATORY_CARE_PROVIDER_SITE_OTHER): Payer: Medicare Other | Admitting: *Deleted

## 2011-04-22 ENCOUNTER — Ambulatory Visit (INDEPENDENT_AMBULATORY_CARE_PROVIDER_SITE_OTHER): Payer: Medicare Other | Admitting: Vascular Surgery

## 2011-04-22 ENCOUNTER — Other Ambulatory Visit (INDEPENDENT_AMBULATORY_CARE_PROVIDER_SITE_OTHER): Payer: Medicare Other | Admitting: *Deleted

## 2011-04-22 VITALS — BP 156/73 | HR 74 | Resp 24 | Ht 69.0 in | Wt 136.6 lb

## 2011-04-22 DIAGNOSIS — Z48812 Encounter for surgical aftercare following surgery on the circulatory system: Secondary | ICD-10-CM

## 2011-04-22 DIAGNOSIS — I739 Peripheral vascular disease, unspecified: Secondary | ICD-10-CM

## 2011-04-22 DIAGNOSIS — I70219 Atherosclerosis of native arteries of extremities with intermittent claudication, unspecified extremity: Secondary | ICD-10-CM

## 2011-04-22 NOTE — Discharge Summary (Signed)
  NAME:  PAGE, LANCON NO.:  [id number]  MEDICAL RECORD NO.:  [id number]  LOCATION:  5149                         FACILITY:  [facility]  PHYSICIAN:  Ollen Gross. Vernell Morgans, M.D. DATE OF BIRTH:  1941/04/01  DATE OF ADMISSION:  02/09/2011 DATE OF DISCHARGE:  02/24/2011                              DISCHARGE SUMMARY   [name] [name] is a 70 year old white male who was brought to the operating room on February 09, 2011, and underwent a laparoscopic ventral hernia repair with mesh.  The patient tolerated the operation well.  His postoperative course was complicated by a persistent ileus, so we were unable to advance his diet a right away.  We did start him on Reglan. He also had some issues with his breathing because of his COPD.  It seemed as though he may have had a community-acquired pneumonia and was started on Avelox.  Duplexes were performed to rule out the lower extremity DVTs.  He did have a CT scan during postoperative period to make sure that he had no complication from his surgery and this did not show any evidence of complication and NG tube was placed on February 16, 2011.  His antibiotics were then switched from Avelox to doxycycline. He made very slow improvement.  I believe he got switched back to Avelox because he failed to improve on the doxycycline.  His diet was then slowly advanced and on February 24, 2011, he was discharged home is.  DIET:  As tolerated.  ACTIVITIES:  No heavy lifting.  MEDICATIONS:  He is to resume his home meds and he was given a prescription for pain medicine.  FINAL DIAGNOSIS:  Ventral hernia repair with mesh.  FOLLOWUP:  Will be with Dr. Carolynne Edouard in the next week or 2 and [name] [name] is discharged home.     Ollen Gross. Vernell Morgans, M.D.     PST/MEDQ  D:  04/20/2011  T:  04/20/2011  Job:  [id number]  Electronically Signed by Chevis Pretty III M.D. on 04/22/2011 10:30:23 AM

## 2011-04-22 NOTE — Progress Notes (Signed)
[facility] [place]  Patient name: [name] [name] MRN: [id number] DOB: [birth year] Sex: male  CC: followup of left axillobifemoral bypass graft  HPI: [name] [name] is a 70 y.o. 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 He therefore had a left axillobifemoral bypass graft on 07/24/2010. He comes in for a routine followup visit. He has not had any recent claudication, rest pain, or nonhealing ulcers. However he states that his activity has been limited because of her recent injury. He was walking the dog and got pulled by the dog and sustained bruising to his left chest. He does have a history of hypertension and hyperlipidemia both of which are stable on his current medication.  Past Medical History  Diagnosis Date  . Hypertension   . Hernia   . Stroke 2004    minor  . Hyperlipidemia   . Peripheral vascular disease   . Shoulder fracture, left     Family History  Problem Relation Age of Onset  . Alzheimer's disease Mother   . Cancer Father     prostate  . Cancer Brother     prostate  . Stroke Brother     SOCIAL HISTORY: History  Substance Use Topics  . Smoking status: Current Everyday Smoker -- 0.3 packs/day for 50 years    Types: Cigarettes  . Smokeless tobacco: Never Used   Comment: pt says he is down to 4-5 cigarettes a week  . Alcohol Use: Yes     2 drinks per day    Allergies  Allergen Reactions  . Ceclor (Cefaclor)     Current Outpatient Prescriptions  Medication Sig Dispense Refill  . amLODipine (NORVASC) 10 MG tablet Take 10 mg by mouth daily.        Marland Kitchen gabapentin (NEURONTIN) 600 MG tablet Take 600 mg by mouth 2 (two) times daily.        . hydrALAZINE (APRESOLINE) 25 MG tablet Take 25 mg by mouth 3 (three) times daily.        Marland Kitchen HYDROcodone-acetaminophen (VICODIN) 5-500 MG per tablet 1 by mouth every 4-6 hours as needed      . losartan (COZAAR)  50 MG tablet Take 50 mg by mouth 2 (two) times daily.        . potassium phosphate, monobasic, (K-PHOS ORIGINAL) 500 MG tablet Take 500 mg by mouth 2 (two) times daily.        . pravastatin (PRAVACHOL) 40 MG tablet Take 40 mg by mouth daily.        . traMADol (ULTRAM) 50 MG tablet       . traZODone (DESYREL) 150 MG tablet Take 150 mg by mouth as needed.          REVIEW OF SYSTEMS: Arly.Keller ] denotes positive finding; [  ] denotes negative finding CARDIOVASCULAR:  [ ]  chest pain   [ ]  chest pressure   [ ]  palpitations   [ ]  orthopnea   [ ]  dyspnea on exertion   [ ]  claudication   [ ]  rest pain   [ ]  DVT   [ ]  phlebitis PULMONARY:   [ ]  productive cough   [ ]  asthma   [ ]  wheezing NEUROLOGIC:   [ ]  weakness  [ ]  paresthesias  [ ]  aphasia  [ ]  amaurosis  [ ]  dizziness HEMATOLOGIC:   [ ]  bleeding problems   [ ]  clotting disorders MUSCULOSKELETAL:  [ ]   joint pain   [ ]  joint swelling GASTROINTESTINAL: [ ]   blood in stool  [ ]   hematemesis GENITOURINARY:  [ ]   dysuria  [ ]   hematuria PSYCHIATRIC:  [ ]  history of major depression INTEGUMENTARY:  [ ]  rashes  [ ]  ulcers CONSTITUTIONAL:  [ ]  fever   [ ]  chills  PHYSICAL EXAM: Filed Vitals:   04/22/11 1226  BP: 156/73  Pulse: 74  Resp: 24  Height: 5\' 9"  (1.753 m)  Weight: 136 lb 9.6 oz (61.961 kg)   Body mass index is 20.17 kg/(m^2). GENERAL: The patient is a well-nourished male, in no acute distress. The vital signs are documented above. CARDIOVASCULAR: There is a regular rate and rhythm without significant murmur appreciated. He has bilateral carotid bruits. He has palpable femoral and popliteal pulses bilaterally with warm well-perfused feet. He does not have any significant lower Cherie swelling. PULMONARY: There is good air exchange bilaterally without wheezing or rales. ABDOMEN: Soft and non-tender with normal pitched bowel sounds.  MUSCULOSKELETAL: There are no major deformities or cyanosis. NEUROLOGIC: No focal weakness or paresthesias are  detected. SKIN: There are no ulcers or rashes noted. PSYCHIATRIC: The patient has a normal affect.  DATA:  I have independently interpreted his arterial Doppler study today which shows an ABI 44% on the right and 56% on the left. This has not changed significantly since his previous study in December when he was 40% on the right and 59% on the left. Given his injury from the dog we did obtain a duplex scan of his graft which shows his left axillobifemoral bypass graft is patent.  MEDICAL ISSUES: His axillobifemoral bypass graft is functioning well.have again discussed the importance of trying to get off tobacco completely. I've encouraged him to stay as active as possible. I'll see him back in 6 months. I've ordered a followup arterial Doppler study for that time. Of note with respect to his carotid bruits he has routine carotid Doppler studies done at [facility] heart and vascular.  Almus Woodham S [facility] [place] Office: [phone]

## 2011-04-29 NOTE — Procedures (Unsigned)
BYPASS GRAFT EVALUATION  INDICATION:  Bypass graft  HISTORY: Diabetes:  No Cardiac:  No Hypertension:  Yes Smoking:  No Previous Surgery:  Left axillary to bifemoral artery bypass graft on 07/25/2010  SINGLE LEVEL ARTERIAL EXAM                              RIGHT              LEFT Brachial: Anterior tibial: Posterior tibial: Peroneal: Ankle/brachial index:  PREVIOUS ABI:  Date:  RIGHT:  LEFT:  LOWER EXTREMITY BYPASS GRAFT DUPLEX EXAM:  DUPLEX:  Monophasic Doppler waveforms noted in the bypass graft with velocities of 203 cm/s and 191 cm/s noted in the left lateral chest level of the bypass graft.  These velocities appear to be due to vessel tortuosity.   IMPRESSION: 1. Patent axillary to bifemoral artery bypass graft with velocities as     described above. 2. Ankle brachial indices are noted on a separate report.        ___________________________________________ Di Kindle. Edilia Bo, M.D.  CH/MEDQ  D:  04/24/2011  T:  04/24/2011  Job:  [id number]

## 2011-06-08 ENCOUNTER — Other Ambulatory Visit (HOSPITAL_COMMUNITY): Payer: Self-pay | Admitting: Cardiovascular Disease

## 2011-06-08 DIAGNOSIS — I6529 Occlusion and stenosis of unspecified carotid artery: Secondary | ICD-10-CM

## 2011-06-11 ENCOUNTER — Ambulatory Visit (HOSPITAL_COMMUNITY)
Admission: RE | Admit: 2011-06-11 | Discharge: 2011-06-11 | Disposition: A | Discharge status: Home / Self Care | Payer: Medicare Other | Source: Ambulatory Visit | Attending: Cardiovascular Disease | Admitting: Cardiovascular Disease

## 2011-06-11 ENCOUNTER — Encounter (HOSPITAL_COMMUNITY): Payer: Self-pay

## 2011-06-11 DIAGNOSIS — I6529 Occlusion and stenosis of unspecified carotid artery: Secondary | ICD-10-CM | POA: Insufficient documentation

## 2011-06-11 MED ORDER — IOHEXOL 350 MG/ML SOLN
50.0000 mL | Freq: Once | INTRAVENOUS | Status: AC | PRN
  Administered 2011-06-11: 50 mL via INTRAVENOUS

## 2011-07-23 ENCOUNTER — Ambulatory Visit (INDEPENDENT_AMBULATORY_CARE_PROVIDER_SITE_OTHER): Payer: Medicare Other | Admitting: Adult Health

## 2011-07-23 ENCOUNTER — Telehealth: Payer: Self-pay | Admitting: Emergency Medicine

## 2011-07-23 ENCOUNTER — Encounter: Payer: Self-pay | Admitting: Adult Health

## 2011-07-23 ENCOUNTER — Ambulatory Visit (INDEPENDENT_AMBULATORY_CARE_PROVIDER_SITE_OTHER)
Admission: RE | Admit: 2011-07-23 | Discharge: 2011-07-23 | Disposition: A | Discharge status: Home / Self Care | Payer: Medicare Other | Source: Ambulatory Visit | Attending: Adult Health | Admitting: Adult Health

## 2011-07-23 VITALS — BP 126/68 | HR 71 | Temp 97.5°F | Ht 68.0 in | Wt 140.8 lb

## 2011-07-23 DIAGNOSIS — R918 Other nonspecific abnormal finding of lung field: Secondary | ICD-10-CM

## 2011-07-23 DIAGNOSIS — J40 Bronchitis, not specified as acute or chronic: Secondary | ICD-10-CM

## 2011-07-23 MED ORDER — AMOXICILLIN-POT CLAVULANATE 875-125 MG PO TABS: 1.0000 | ORAL_TABLET | Freq: Two times a day (BID) | ORAL | Status: AC

## 2011-07-23 NOTE — Telephone Encounter (Signed)
Spoke with pt. He states has been having sinus drainage and prod cough with bloody sputum. Unsure if blood is related to sinus infection or his lung dz. OV with TP at 2:30 today for eval

## 2011-07-23 NOTE — Telephone Encounter (Signed)
LMTCB x 1 

## 2011-07-23 NOTE — Progress Notes (Signed)
  Subjective:    Patient ID: [name] [name], male    DOB: Apr 29, 1941, 71 y.o.   MRN: [id number]  HPI 71  yo smoker (80 + pk-yrs), hx PVD,  03/04/2011  recent admission for ventral hernia repair.  Has CXR and Abd CT with LLL atx vs PNA, treated w abx. Also with 2 small 4mm nodules. Here for f/u. Has been doing well, no dyspnea, abd incision doing well.  >follow up w/ PFT and repeat CT in 09/2011   07/23/2011 Acute OV  Pt presents for an acute workin visit.  Complains of prod cough with brownish white mucus, sinus pressure/congestion, wheezing in the AM, pain in left side of chest, low grade temp x2weeks . OTC not helping.  Has a lot of drainage and hoarseness.  Still smoking. He is back to work.      Review of Systems Constitutional:   No  weight loss, night sweats,  Fevers, chills, fatigue, or  lassitude.  HEENT:   No headaches,  Difficulty swallowing,  Tooth/dental problems, or  Sore throat,                No sneezing, itching, ear ache,  +nasal congestion, post nasal drip,   CV:  No chest pain,  Orthopnea, PND, swelling in lower extremities, anasarca, dizziness, palpitations, syncope.   GI  No heartburn, indigestion, abdominal pain, nausea, vomiting, diarrhea, change in bowel habits, loss of appetite, bloody stools.   RESP :  No chest wall deformity  Skin: no rash or lesions.  GU: no dysuria, change in color of urine, no urgency or frequency.  No flank pain, no hematuria   MS:  No joint pain or swelling.  No decreased range of motion.  No back pain.  Psych:  No change in mood or affect. No depression or anxiety.  No memory loss.         Objective:   Physical Exam GEN: A/Ox3; pleasant , NAD, well nourished   HEENT:  [place]/AT,  EACs-clear, TMs-wnl, NOSE-clear, THROAT-clear, no lesions, no postnasal drip or exudate noted.   NECK:  Supple w/ fair ROM; no JVD; normal carotid impulses   no thyromegaly or nodules palpated; no lymphadenopathy.  RESP  Coarse BS w/o, wheezes/  rales/ or rhonchi.no accessory muscle use, no dullness to percussion  CARD:  RRR, no m/r/g  , no peripheral edema, pulses intact, no cyanosis or clubbing.  GI:   Soft & nt; nml bowel sounds; no organomegaly or masses detected.  Musco: Warm bil, no deformities or joint swelling noted.   Neuro: alert, no focal deficits noted.    Skin: Warm, no lesions or rashes         Assessment & Plan:

## 2011-07-23 NOTE — Patient Instructions (Signed)
Augmentin 875mg  Twice daily  For 7 days with food.  Mucinex DM Twice daily  As needed  Cough/congesiton  Saline nasal rinses As needed   Please contact office for sooner follow up if symptoms do not improve or worsen or seek emergency care  follow up Dr. Delton Coombes  Next month as planned and As needed

## 2011-07-23 NOTE — Telephone Encounter (Signed)
Pt again stated he thinks this is an infection & that he is in terrible pain.  I offered an appt today w/ TP.  Pt chose to make an appointment w/ RB on 2/18 @ 1:45 & to request an ABX.  I again suggestion pt to contact his PCP or go to ED/UC.  Antionette Fairy

## 2011-07-23 NOTE — Assessment & Plan Note (Signed)
Acute Bronchitis  CXR w/ chronic changes - no acute process   Plan:  Augmentin 875mg  Twice daily  For 7 days with food.  Mucinex DM Twice daily  As needed  Cough/congesiton  Saline nasal rinses As needed   Please contact office for sooner follow up if symptoms do not improve or worsen or seek emergency care  follow up Dr. Delton Coombes  Next month as planned and As needed

## 2011-08-10 ENCOUNTER — Ambulatory Visit: Payer: Medicare Other | Admitting: Emergency Medicine

## 2011-08-23 ENCOUNTER — Ambulatory Visit (INDEPENDENT_AMBULATORY_CARE_PROVIDER_SITE_OTHER): Payer: Medicare Other | Admitting: Family Medicine

## 2011-08-23 ENCOUNTER — Ambulatory Visit: Payer: Medicare Other

## 2011-08-23 VITALS — BP 153/69 | HR 76 | Temp 98.3°F | Resp 18 | Ht 66.0 in | Wt 136.4 lb

## 2011-08-23 DIAGNOSIS — M545 Low back pain, unspecified: Secondary | ICD-10-CM

## 2011-08-23 DIAGNOSIS — R0602 Shortness of breath: Secondary | ICD-10-CM

## 2011-08-23 DIAGNOSIS — M549 Dorsalgia, unspecified: Secondary | ICD-10-CM

## 2011-08-23 MED ORDER — AZITHROMYCIN 250 MG PO TABS: ORAL_TABLET | ORAL | Status: DC

## 2011-08-23 MED ORDER — HYDROCODONE-ACETAMINOPHEN 5-500 MG PO TABS: 1.0000 | ORAL_TABLET | Freq: Three times a day (TID) | ORAL | Status: AC | PRN

## 2011-08-23 NOTE — Progress Notes (Signed)
Patient Name: [name] [name] Date of Birth: 03-01-1941 Medical Record Number: [id number] Gender: male Date of Encounter: 08/23/2011  History of Present Illness:  [name] [name] is a 71 y.o. very pleasant male patient who presents with the following:  Hurting in his left back and side for several days- worse the last 24 hours.  Hurts to take a deep breath. No chest pain.  Not really short winded but has pain with breathing.  Skin does not hurt- did not hurt for water in the shower or for clothing to touch area.  Wished he had some pain pills- had 2 drinks instead last night and slept ok.  Not really coughing much, but he is hoarse. No fever, taking PO ok.    He also has noticed that he has a tender lymph node in his left neck. He does feel that the pain is definitely related to movement- this morning he felt very stiff and sore when he tried to move his arms to dress, etc but this has gotten better.  He notes the pain when he stretches or moves in a certain way now.    History of COPD and chronic breathing problems.  Right now he feels that the main issue is the pain he has with a deep breath.  No history of CAD, PE.  Recent ventral hernia repair which is doing well- he is eating ok and having no abdominal pain.    Patient Active Problem List  Diagnoses  . Ventral hernia without obstruction or gangrene  . Tobacco abuse  . Pulmonary nodules  . Atherosclerosis of native arteries of the extremities with intermittent claudication  . Bronchitis   Past Medical History  Diagnosis Date  . Hypertension   . Hernia   . Stroke 2004    minor  . Hyperlipidemia   . Peripheral vascular disease   . Shoulder fracture, left    Past Surgical History  Procedure Date  . Bypass graft 07/2010    axillobifemoral BPG  . Spine surgery 11/23/06    microdiscectomy L5-S1  . Hemorrhoid surgery   . Tonsillectomy   . Hernia repair 02/09/11    Ventral   History  Substance Use Topics  . Smoking status: Current  Everyday Smoker -- 0.5 packs/day for 50 years    Types: Cigarettes  . Smokeless tobacco: Never Used  . Alcohol Use: Yes     2 drinks per day   Family History  Problem Relation Age of Onset  . Alzheimer's disease Mother   . Cancer Father     prostate  . Cancer Brother     prostate  . Stroke Brother    Allergies  Allergen Reactions  . Ceclor (Cefaclor)     Medication list has been reviewed and updated.  Review of Systems: As per HPI- otherwise negative. Of note no exertional CP  Physical Examination: Filed Vitals:   08/23/11 1336  BP: 153/69  Pulse: 76  Temp: 98.3 F (36.8 C)  TempSrc: Oral  Resp: 18  Height: 5\' 6"  (1.676 m)  Weight: 136 lb 6.4 oz (61.871 kg)    Body mass index is 22.02 kg/(m^2).  GEN: WDWN, NAD, Non-toxic, A & O x 3, thin HEENT: Atraumatic, Normocephalic. Neck supple. No masses. Ears and Nose: No external deformity. CV: RRR, No M/G/R. No JVD. No thrill. No extra heart sounds. PULM: CTA B, no wheezes, crackles, rhonchi. No retractions. No resp. distress. No accessory muscle use. Trunk: no rash or vesicles.  Am able  to somewhat reproduce pain with pressure medial to his left scapula.  There is a tender node right anteior cervical chain.  No axillary nodes.  Bypass graft in place.  ABD: evidence of ventral hernia repair, but no tenderness or distension.   EXTR: No c/c/e NEURO Normal gait.  PSYCH: Normally interactive. Conversant. Not depressed or anxious appearing.  Calm demeanor.   [facility] reading (PRIMARY) by  Dr. Patsy Lager.  No acute disease on CXR- hyperinflation  CHEST - 2 VIEW  Comparison: 07/23/2011 and 02/15/2011.  Findings: The heart size and mediastinal contours are stable. There is extensive atherosclerosis of the aorta and subclavian arteries. The lungs are clear with stable changes of chronic obstructive pulmonary disease. There is no pleural effusion. Surgical clips are present anteriorly in the upper left chest. A mid thoracic  compression deformity on the lateral view appears new from the most recent examination.  IMPRESSION:  1. Mid thoracic compression deformity appears new from the examination of 5 weeks prior. Per CMS PQRS reporting requirements (PQRS Measure 24): Given the patient's age of greater than 50 and the fracture site (hip, distal radius, or spine), the patient should be tested for osteoporosis using DXA, and the appropriate treatment considered based on the DXA results. 2. Otherwise stable examination with emphysema and diffuse atherosclerosis.  Assessment and Plan: 1. Back pain  DG Chest 2 View, HYDROcodone-acetaminophen (VICODIN) 5-500 MG per tablet, azithromycin (ZITHROMAX) 250 MG tablet   [name] [name] is having likely MSK vs. Pleuritic pain.  Also consider shingles, but he does not have skin pain and there is no rash after several days of symptoms.  Discussed in detail- I explained that I cannot rule out a cardiac or more serious pulmonary or vascular cause.  He declined an EKG, D Dimer, outpatient CT and ED evaluation.  He really has the feeling that this is MSK pain, and prefers to do a trial of pain medication and rest.  However, [name] [name] did agree to seek care right away if he gets worse, develops CP, or if he is not better in a day or so.    addnd 08/24/11: called to discuss above findings- let pt know a compression fracture is likely why he has this pain.  Offered referral to IR for possible kyphoplasty- however he wants to defer this for now and see if he feels better in a few days.  If he feels worse or his symptoms change he will let us know. He will call us if her is not feeling better in a few days.   I will go ahead and refer him for a dexa scan as he does not think he has had one in the past.

## 2011-08-25 ENCOUNTER — Telehealth: Payer: Self-pay

## 2011-08-25 NOTE — Telephone Encounter (Signed)
.  [facility] PT WOULD LIKE [place] TO SEND OVER HIS XRAYS TO [facility] [place]. HE IS IN A LOT OF PAIN AND IS TRYING TO MAKE AN APPT WITH THEM YOU MAY REACH PT AT [phone] AND THE NUMBER TO [facility] IS [phone]

## 2011-08-25 NOTE — Telephone Encounter (Signed)
PT WILL BE BY TO PICKUP XRAY AND REPORT

## 2011-08-28 ENCOUNTER — Encounter: Payer: Self-pay | Admitting: Emergency Medicine

## 2011-08-28 ENCOUNTER — Ambulatory Visit (INDEPENDENT_AMBULATORY_CARE_PROVIDER_SITE_OTHER): Payer: Medicare Other | Admitting: Emergency Medicine

## 2011-08-28 DIAGNOSIS — Z720 Tobacco use: Secondary | ICD-10-CM

## 2011-08-28 DIAGNOSIS — F17200 Nicotine dependence, unspecified, uncomplicated: Secondary | ICD-10-CM

## 2011-08-28 DIAGNOSIS — R918 Other nonspecific abnormal finding of lung field: Secondary | ICD-10-CM

## 2011-08-28 NOTE — Assessment & Plan Note (Signed)
-   need CT scan chest w contrast end of march or beginning April to follow nodules.

## 2011-08-28 NOTE — Patient Instructions (Signed)
Please continue to work on decreasing her cigarettes. We will talk about stopping completely at your next visit We will perform a CT scan of your chest to evaluate pulmonary nodules You will perform pulmonary function testing at our next office visit Followup with Dr. Delton Coombes in one month with full PFTs

## 2011-08-28 NOTE — Progress Notes (Signed)
  Subjective:    Patient ID: [name] [name], male    DOB: [birth year], 71 y.o.   MRN: [id number]  HPI 71  yo smoker (80 + pk-yrs), hx PVD,  03/04/2011  recent admission for ventral hernia repair.  Has CXR and Abd CT with LLL atx vs PNA, treated w abx. Also with 2 small 4mm nodules. Here for f/u. Has been doing well, no dyspnea, abd incision doing well.  >follow up w/ PFT and repeat CT in 09/2011   Acute OV 07/23/11 Pt presents for an acute workin visit.  Complains of prod cough with brownish white mucus, sinus pressure/congestion, wheezing in the AM, pain in left side of chest, low grade temp x2weeks . OTC not helping.  Has a lot of drainage and hoarseness.  Still smoking. He is back to work.   ROV 08/28/11 -- tobacco use w COPD, hx pulm nodules.  Was seen in urgent care 6 days ago for back pain, CXR done that showed compression fx, no infiltrates. He is to see Dr Darrelyn Hillock regarding the workup. Was rx for acute bronchitis end of January - feels much better now from breathing standpoint. Cough is better, still with some sinus drainage. Able to exert, minimal SOB. Hasn't has PFT, needs repeat CT scan to follow pulm nodules in March or April of this year.       Objective:   Physical Exam GEN: A/Ox3; pleasant , NAD, well nourished   HEENT:  [place]/AT,  EACs-clear, TMs-wnl, NOSE-clear, THROAT-clear, no lesions, no postnasal drip or exudate noted.   NECK:  Supple w/ fair ROM; no JVD; normal carotid impulses   no thyromegaly or nodules palpated; no lymphadenopathy.  RESP  Coarse BS w/o, wheezes/ rales/ or rhonchi.no accessory muscle use, no dullness to percussion  CARD:  RRR, no m/r/g  , no peripheral edema, pulses intact, no cyanosis or clubbing  Musco: Warm bil, no deformities or joint swelling noted.   Neuro: alert, no focal deficits noted.    Skin: Warm, no lesions or rashes      Assessment & Plan:  Pulmonary nodules - need CT scan chest w contrast end of march or beginning April to  follow nodules.   Tobacco abuse - discussed cessation, down to 1/3 pack a day - PFT next visit - no BDs at this time

## 2011-08-28 NOTE — Assessment & Plan Note (Signed)
-   discussed cessation, down to 1/3 pack a day - PFT next visit - no BDs at this time

## 2011-09-04 ENCOUNTER — Other Ambulatory Visit: Payer: Self-pay | Admitting: Family Medicine

## 2011-09-04 DIAGNOSIS — IMO0002 Reserved for concepts with insufficient information to code with codable children: Secondary | ICD-10-CM

## 2011-09-04 DIAGNOSIS — M549 Dorsalgia, unspecified: Secondary | ICD-10-CM

## 2011-09-08 ENCOUNTER — Ambulatory Visit
Admission: RE | Admit: 2011-09-08 | Discharge: 2011-09-08 | Disposition: A | Discharge status: Home / Self Care | Payer: Medicare Other | Source: Ambulatory Visit | Attending: Family Medicine | Admitting: Family Medicine

## 2011-09-08 DIAGNOSIS — IMO0002 Reserved for concepts with insufficient information to code with codable children: Secondary | ICD-10-CM

## 2011-09-08 DIAGNOSIS — M549 Dorsalgia, unspecified: Secondary | ICD-10-CM

## 2011-09-12 ENCOUNTER — Telehealth: Payer: Self-pay | Admitting: Pulmonary Disease

## 2011-09-12 MED ORDER — LEVOFLOXACIN 750 MG PO TABS: 750.0000 mg | ORAL_TABLET | Freq: Every day | ORAL | Status: AC

## 2011-09-12 NOTE — Telephone Encounter (Signed)
Pt called for c/o hemoptysis.  He coughed up dime sized blood yest, then no further.  This am coughed up one teaspoon brb, but no further.  He is more congested, but denies purulent mucus.  No increase in sob, no chest pain. Still smoking!! Most likely this is bronchitis.  Will call in abx.  Asked pt to call us if abx does not clear up, stop smoking,  He is to go to ER if hemoptysis worsens.

## 2011-09-25 ENCOUNTER — Ambulatory Visit (INDEPENDENT_AMBULATORY_CARE_PROVIDER_SITE_OTHER)
Admission: RE | Admit: 2011-09-25 | Discharge: 2011-09-25 | Disposition: A | Discharge status: Home / Self Care | Payer: Medicare Other | Source: Ambulatory Visit | Attending: Emergency Medicine | Admitting: Emergency Medicine

## 2011-09-25 DIAGNOSIS — R918 Other nonspecific abnormal finding of lung field: Secondary | ICD-10-CM

## 2011-09-25 MED ORDER — IOHEXOL 300 MG/ML  SOLN
80.0000 mL | Freq: Once | INTRAMUSCULAR | Status: AC | PRN
  Administered 2011-09-25: 80 mL via INTRAVENOUS

## 2011-10-13 ENCOUNTER — Encounter: Payer: Self-pay | Admitting: Emergency Medicine

## 2011-10-13 ENCOUNTER — Ambulatory Visit (INDEPENDENT_AMBULATORY_CARE_PROVIDER_SITE_OTHER): Payer: Medicare Other | Admitting: Emergency Medicine

## 2011-10-13 VITALS — BP 156/58 | HR 72 | Temp 97.9°F | Ht 68.0 in | Wt 137.0 lb

## 2011-10-13 DIAGNOSIS — F17200 Nicotine dependence, unspecified, uncomplicated: Secondary | ICD-10-CM

## 2011-10-13 DIAGNOSIS — J449 Chronic obstructive pulmonary disease, unspecified: Secondary | ICD-10-CM | POA: Insufficient documentation

## 2011-10-13 DIAGNOSIS — Z720 Tobacco use: Secondary | ICD-10-CM

## 2011-10-13 DIAGNOSIS — R918 Other nonspecific abnormal finding of lung field: Secondary | ICD-10-CM

## 2011-10-13 LAB — PULMONARY FUNCTION TEST

## 2011-10-13 NOTE — Progress Notes (Signed)
  Subjective:    Patient ID: [name] [name], male    DOB: [birth year], 71 y.o.   MRN: [id number]  HPI 71  yo smoker (80 + pk-yrs), hx PVD,  03/04/2011  recent admission for ventral hernia repair.  Has CXR and Abd CT with LLL atx vs PNA, treated w abx. Also with 2 small 4mm nodules. Here for f/u. Has been doing well, no dyspnea, abd incision doing well.  >follow up w/ PFT and repeat CT in 09/2011   Acute OV 07/23/11 Pt presents for an acute workin visit.  Complains of prod cough with brownish white mucus, sinus pressure/congestion, wheezing in the AM, pain in left side of chest, low grade temp x2weeks . OTC not helping.  Has a lot of drainage and hoarseness.  Still smoking. He is back to work.   ROV 08/28/11 -- tobacco use w COPD, hx pulm nodules.  Was seen in urgent care 6 days ago for back pain, CXR done that showed compression fx, no infiltrates. He is to see [name] [name] regarding the workup. Was rx for acute bronchitis end of January - feels much better now from breathing standpoint. Cough is better, still with some sinus drainage. Able to exert, minimal SOB. Hasn't has PFT, needs repeat CT scan to follow pulm nodules in March or April of this year.   ROV 10/13/11 -- tobacco use w COPD, hx pulm nodules. He tells me that he wants to stop smoking and is going to use wellbutrin and nicotine patches. Had CT scan that showed stable nodules as below. Had PFT today: mild AFL, normal volumes, decreased diffusion. Denies any dyspnea or other pulmonary symptoms.   PULMONARY FUNCTON TEST 10/13/2011  FVC 4.14  FEV1 2.4  FEV1/FVC 58  FVC  % Predicted 102  FEV % Predicted 87  FeF 25-75 0.86  FeF 25-75 % Predicted 2.56       Objective:   Physical Exam GEN: A/Ox3; pleasant , NAD, well nourished   HEENT:  [place]/AT,  EACs-clear, TMs-wnl, NOSE-clear, THROAT-clear, no lesions, no postnasal drip or exudate noted.   NECK:  Supple w/ fair ROM; no JVD; normal carotid impulses   no thyromegaly or nodules  palpated; no lymphadenopathy.  RESP  Coarse BS w/o, wheezes/ rales/ or rhonchi.no accessory muscle use, no dullness to percussion  CARD:  RRR, no m/r/g  , no peripheral edema, pulses intact, no cyanosis or clubbing  Musco: Warm bil, no deformities or joint swelling noted.   Neuro: alert, no focal deficits noted.    Skin: Warm, no lesions or rashes   CT chest 09/29/11:  IMPRESSION:  1. No acute cardiopulmonary abnormalities.  2. Pulmonary nodules in the right base are unchanged from  05/10/2009. This demonstrates a stability of approximately 25  months. These are most likely benign.      Assessment & Plan:  Tobacco abuse He has plans to quit on 4/24, using nicotine patches and Wellbutrin. Mild AFL by PFT today.  - will support him in any way possible. Discussed cessation strategy.  - will defer scheduled BD at this time, teach him rescue SABA techniquie  Pulmonary nodules Stable over 2 yrs by CT scan  COPD (chronic obstructive pulmonary disease) By PFT 10/13/11.

## 2011-10-13 NOTE — Patient Instructions (Signed)
Your CT scan shows that your lung nodules have been stable for 2 years Your breathing tests show mild COPD.  We will teach you to use an inhaler today. You should use ventolin 2 puffs if needed for shortness of breath.  We discussed techniques to help you stop smoking today. Please call our office if we can help you as you work on quitting.  Follow with Dr Delton Coombes in 6 months or sooner if you have any problems

## 2011-10-13 NOTE — Progress Notes (Signed)
PFT done today. 

## 2011-10-13 NOTE — Assessment & Plan Note (Signed)
By PFT 10/13/11.

## 2011-10-13 NOTE — Assessment & Plan Note (Addendum)
He has plans to quit on 4/24, using nicotine patches and Wellbutrin. Mild AFL by PFT today.  - will support him in any way possible. Discussed cessation strategy.  - will defer scheduled BD at this time, teach him rescue SABA techniquie

## 2011-10-13 NOTE — Assessment & Plan Note (Signed)
Stable over 2 yrs by CT scan

## 2011-10-15 ENCOUNTER — Other Ambulatory Visit: Payer: Self-pay | Admitting: *Deleted

## 2011-10-15 DIAGNOSIS — I7092 Chronic total occlusion of artery of the extremities: Secondary | ICD-10-CM

## 2011-10-15 DIAGNOSIS — Z48812 Encounter for surgical aftercare following surgery on the circulatory system: Secondary | ICD-10-CM

## 2011-10-21 ENCOUNTER — Encounter (INDEPENDENT_AMBULATORY_CARE_PROVIDER_SITE_OTHER): Payer: Medicare Other | Admitting: *Deleted

## 2011-10-21 DIAGNOSIS — Z48812 Encounter for surgical aftercare following surgery on the circulatory system: Secondary | ICD-10-CM

## 2011-10-21 DIAGNOSIS — I70219 Atherosclerosis of native arteries of extremities with intermittent claudication, unspecified extremity: Secondary | ICD-10-CM

## 2011-10-21 DIAGNOSIS — I739 Peripheral vascular disease, unspecified: Secondary | ICD-10-CM

## 2011-10-28 ENCOUNTER — Other Ambulatory Visit: Payer: Self-pay | Admitting: *Deleted

## 2011-10-28 DIAGNOSIS — I739 Peripheral vascular disease, unspecified: Secondary | ICD-10-CM

## 2011-10-28 DIAGNOSIS — Z48812 Encounter for surgical aftercare following surgery on the circulatory system: Secondary | ICD-10-CM

## 2011-10-29 ENCOUNTER — Encounter: Payer: Self-pay | Admitting: Vascular Surgery

## 2012-03-15 ENCOUNTER — Emergency Department (HOSPITAL_BASED_OUTPATIENT_CLINIC_OR_DEPARTMENT_OTHER)
Admission: EM | Admit: 2012-03-15 | Discharge: 2012-03-15 | Disposition: A | Discharge status: Home / Self Care | Payer: Medicare Other | Source: Home / Self Care | Attending: Emergency Medicine | Admitting: Emergency Medicine

## 2012-03-15 ENCOUNTER — Encounter (HOSPITAL_BASED_OUTPATIENT_CLINIC_OR_DEPARTMENT_OTHER): Payer: Self-pay

## 2012-03-15 ENCOUNTER — Emergency Department (HOSPITAL_BASED_OUTPATIENT_CLINIC_OR_DEPARTMENT_OTHER): Payer: Medicare Other

## 2012-03-15 DIAGNOSIS — Z79899 Other long term (current) drug therapy: Secondary | ICD-10-CM

## 2012-03-15 DIAGNOSIS — G9340 Encephalopathy, unspecified: Secondary | ICD-10-CM | POA: Diagnosis present

## 2012-03-15 DIAGNOSIS — J441 Chronic obstructive pulmonary disease with (acute) exacerbation: Secondary | ICD-10-CM | POA: Diagnosis present

## 2012-03-15 DIAGNOSIS — F17200 Nicotine dependence, unspecified, uncomplicated: Secondary | ICD-10-CM | POA: Diagnosis present

## 2012-03-15 DIAGNOSIS — T45515A Adverse effect of anticoagulants, initial encounter: Secondary | ICD-10-CM | POA: Diagnosis not present

## 2012-03-15 DIAGNOSIS — I959 Hypotension, unspecified: Secondary | ICD-10-CM | POA: Diagnosis present

## 2012-03-15 DIAGNOSIS — Y9210 Unspecified residential institution as the place of occurrence of the external cause: Secondary | ICD-10-CM | POA: Diagnosis not present

## 2012-03-15 DIAGNOSIS — E871 Hypo-osmolality and hyponatremia: Secondary | ICD-10-CM | POA: Diagnosis present

## 2012-03-15 DIAGNOSIS — I472 Ventricular tachycardia, unspecified: Secondary | ICD-10-CM | POA: Diagnosis not present

## 2012-03-15 DIAGNOSIS — I10 Essential (primary) hypertension: Secondary | ICD-10-CM | POA: Diagnosis present

## 2012-03-15 DIAGNOSIS — I739 Peripheral vascular disease, unspecified: Secondary | ICD-10-CM | POA: Diagnosis present

## 2012-03-15 DIAGNOSIS — I2510 Atherosclerotic heart disease of native coronary artery without angina pectoris: Secondary | ICD-10-CM | POA: Diagnosis present

## 2012-03-15 DIAGNOSIS — J90 Pleural effusion, not elsewhere classified: Secondary | ICD-10-CM | POA: Diagnosis not present

## 2012-03-15 DIAGNOSIS — I2589 Other forms of chronic ischemic heart disease: Secondary | ICD-10-CM | POA: Diagnosis present

## 2012-03-15 DIAGNOSIS — J9600 Acute respiratory failure, unspecified whether with hypoxia or hypercapnia: Secondary | ICD-10-CM | POA: Diagnosis present

## 2012-03-15 DIAGNOSIS — M8448XA Pathological fracture, other site, initial encounter for fracture: Secondary | ICD-10-CM | POA: Diagnosis present

## 2012-03-15 DIAGNOSIS — E872 Acidosis, unspecified: Secondary | ICD-10-CM | POA: Diagnosis present

## 2012-03-15 DIAGNOSIS — J40 Bronchitis, not specified as acute or chronic: Secondary | ICD-10-CM

## 2012-03-15 DIAGNOSIS — I4891 Unspecified atrial fibrillation: Secondary | ICD-10-CM | POA: Diagnosis not present

## 2012-03-15 DIAGNOSIS — I319 Disease of pericardium, unspecified: Secondary | ICD-10-CM | POA: Diagnosis not present

## 2012-03-15 DIAGNOSIS — Z7902 Long term (current) use of antithrombotics/antiplatelets: Secondary | ICD-10-CM

## 2012-03-15 DIAGNOSIS — E785 Hyperlipidemia, unspecified: Secondary | ICD-10-CM | POA: Diagnosis present

## 2012-03-15 DIAGNOSIS — Z8673 Personal history of transient ischemic attack (TIA), and cerebral infarction without residual deficits: Secondary | ICD-10-CM

## 2012-03-15 DIAGNOSIS — E878 Other disorders of electrolyte and fluid balance, not elsewhere classified: Secondary | ICD-10-CM | POA: Diagnosis present

## 2012-03-15 DIAGNOSIS — I2119 ST elevation (STEMI) myocardial infarction involving other coronary artery of inferior wall: Principal | ICD-10-CM | POA: Diagnosis present

## 2012-03-15 DIAGNOSIS — I4729 Other ventricular tachycardia: Secondary | ICD-10-CM | POA: Diagnosis not present

## 2012-03-15 DIAGNOSIS — D72829 Elevated white blood cell count, unspecified: Secondary | ICD-10-CM | POA: Diagnosis present

## 2012-03-15 DIAGNOSIS — E876 Hypokalemia: Secondary | ICD-10-CM | POA: Diagnosis not present

## 2012-03-15 DIAGNOSIS — Z23 Encounter for immunization: Secondary | ICD-10-CM

## 2012-03-15 DIAGNOSIS — R58 Hemorrhage, not elsewhere classified: Secondary | ICD-10-CM | POA: Diagnosis not present

## 2012-03-15 DIAGNOSIS — I5043 Acute on chronic combined systolic (congestive) and diastolic (congestive) heart failure: Secondary | ICD-10-CM | POA: Diagnosis not present

## 2012-03-15 MED ORDER — ALBUTEROL SULFATE HFA 108 (90 BASE) MCG/ACT IN AERS
INHALATION_SPRAY | RESPIRATORY_TRACT | Status: AC
  Filled 2012-03-15: qty 6.7

## 2012-03-15 MED ORDER — ALBUTEROL SULFATE HFA 108 (90 BASE) MCG/ACT IN AERS: 2.0000 | INHALATION_SPRAY | RESPIRATORY_TRACT | Status: DC | PRN

## 2012-03-15 MED ORDER — ALBUTEROL SULFATE (5 MG/ML) 0.5% IN NEBU
5.0000 mg | INHALATION_SOLUTION | Freq: Once | RESPIRATORY_TRACT | Status: AC
  Administered 2012-03-15: 5 mg via RESPIRATORY_TRACT
  Filled 2012-03-15: qty 1

## 2012-03-15 NOTE — ED Notes (Signed)
Patient was instructed on proper MDI use with spacer. He understands the instructions well and has no questions currently. Medication was not administered via MDI right now because he is currently receiving a nebulizer treatment. Rt will cont to monitor.

## 2012-03-15 NOTE — ED Notes (Signed)
MD at bedside. 

## 2012-03-15 NOTE — ED Provider Notes (Signed)
History     CSN: [id number]  Arrival date & time 03/15/12  1137   First MD Initiated Contact with Patient 03/15/12 1220      Chief Complaint  Patient presents with  . Shortness of Breath    (Consider location/radiation/quality/duration/timing/severity/associated sxs/prior treatment) HPI Pt presents with c/o cough and shortness of breath.  Symptoms have been present over the past week- has had some baseline symptoms for months.  Also c/o mid upper back pain which is worse with movement and palpation.  No fever.  Cough is productive of sputum that has increased over the past 2-3 days.  Pt does smoke cigarettes, states he has never been diagnosed with COPD or wheezing.  No leg swelling, no chest pain, no syncope.  Was seen by his PMD approx 4 days and had CXR but has not been told the results- however was told by his PMD that there may be a mass near his larynx and they are working on having him f/u with an ENT.  He has been having hoarse voice for the past few months as well.  No stridor or increased work of breathing.  There are no other associated systemic symptoms, there are no other alleviating or modifying factors.   Past Medical History  Diagnosis Date  . Hypertension   . Hernia   . Stroke 2004    minor  . Hyperlipidemia   . Peripheral vascular disease   . Shoulder fracture, left   . COPD (chronic obstructive pulmonary disease)     Past Surgical History  Procedure Date  . Bypass graft 07/2010    axillobifemoral BPG  . Spine surgery 11/23/06    microdiscectomy L5-S1  . Hemorrhoid surgery   . Tonsillectomy   . Hernia repair 02/09/11    [facility]    Family History  Problem Relation Age of Onset  . Alzheimer's disease Mother   . Cancer Father     prostate  . Cancer Brother     prostate  . Stroke Brother     History  Substance Use Topics  . Smoking status: Current Every Day Smoker -- 1.0 packs/day for 50 years    Types: Cigarettes  . Smokeless tobacco: Never Used  .  Alcohol Use: 4.2 oz/week    7 Cans of beer per week     2 drinks per day      Review of Systems ROS reviewed and all otherwise negative except for mentioned in HPI  Allergies  Ceclor  Home Medications   No current outpatient prescriptions on file.  BP 122/61  Pulse 72  Temp 98.5 F (36.9 C)  Resp 24  Ht 5\' 8"  (1.727 m)  Wt 138 lb (62.596 kg)  BMI 20.98 kg/m2  SpO2 90% Vitals reviewed Physical Exam Physical Examination: General appearance - alert, well appearing, and in no distress Mental status - alert, oriented to person, place, and time Eyes - no scleral icterus, no conjunctival injection Mouth - mucous membranes moist, pharynx normal without lesions Neck- no JVD Chest -  symmetric air entry, bilateral coarse wheezing- mild, no increased respiratory effort Heart - normal rate, regular rhythm, normal S1, S2, no murmurs, rubs, clicks or gallops Abdomen - soft, nontender, nondistended, no masses or organomegaly Extremities - peripheral pulses normal, no pedal edema, no clubbing or cyanosis Skin - normal coloration and turgor, no rashes  ED Course  Procedures (including critical care time)   Date: 03/15/2012  Rate: 80  Rhythm: normal sinus rhythm with PACs  QRS Axis: normal  Intervals: normal  ST/T Wave abnormalities: normal  Conduction Disutrbances:none  Narrative Interpretation: poor r wave progression  Old EKG Reviewed: changes noted, since previous ekg of 07/07/10 PACs are new   Labs Reviewed - No data to display Dg Chest Port 1 View  03/20/2012  *RADIOLOGY REPORT*  Clinical Data: Right subclavian line pulled back.  Evaluate line position.  PORTABLE CHEST - 1 VIEW  Comparison: 03/18/2012  Findings: Increased interstitial densities throughout both lungs are suggestive for pulmonary edema.  Heart size is upper limits of normal.  The endotracheal tube and nasogastric tube have been removed.  Right subclavian central line tip is near the junction of the right  innominate vein and SVC.  IMPRESSION: Diffusely increased interstitial lung densities.  Findings are suggestive for pulmonary edema.  Central line tip near the junction of the right innominate vein and SVC.   Original Report Authenticated By: Richarda Overlie, M.D.      1. Bronchitis       MDM  Pt presenting with sob and cough which has been present over the past several months.  No chest pain, no fevers, no leg swelling.  He had been told by his PMD that he had a possible lung mass on his CXR and a mass on his larynx causing him to have hoarse voice- he is arranging for follow up with ENT.  CXR today showed chronic changes- no infiltrate or acute process (CXR images reviewed by me as well), he has no edema or JVD, no hx of heart failure, no PND or orthopnea.  Wheezing cleared after albuterol there for I believe this to represent bronchitis - he was discharged with albuterol inhaler, encouraged to f/u with ENT as arranged as well as with his primary care doctor.         Ethelda Chick, MD 03/21/12 [phone]

## 2012-03-15 NOTE — ED Notes (Signed)
C/o SOB and pain to upper back "for months" anterior neck and hoarse this week-states was seen by PCP last week-CXR done-was not advised of results

## 2012-03-15 NOTE — ED Notes (Signed)
Pt requesting to speak with MD prior to discharge. Dr. Karma Ganja aware.

## 2012-03-16 ENCOUNTER — Inpatient Hospital Stay (HOSPITAL_COMMUNITY): Payer: Medicare Other

## 2012-03-16 ENCOUNTER — Inpatient Hospital Stay (HOSPITAL_COMMUNITY)
Admission: EM | Admit: 2012-03-16 | Discharge: 2012-04-06 | DRG: 246 | Disposition: A | Discharge status: Home / Self Care | Payer: Medicare Other | Attending: Cardiovascular Disease | Admitting: Cardiovascular Disease

## 2012-03-16 ENCOUNTER — Encounter (HOSPITAL_COMMUNITY): Payer: Self-pay | Admitting: Family Medicine

## 2012-03-16 ENCOUNTER — Other Ambulatory Visit: Payer: Self-pay

## 2012-03-16 ENCOUNTER — Emergency Department (HOSPITAL_COMMUNITY): Payer: Medicare Other

## 2012-03-16 DIAGNOSIS — I5189 Other ill-defined heart diseases: Secondary | ICD-10-CM

## 2012-03-16 DIAGNOSIS — R0902 Hypoxemia: Secondary | ICD-10-CM | POA: Insufficient documentation

## 2012-03-16 DIAGNOSIS — J9690 Respiratory failure, unspecified, unspecified whether with hypoxia or hypercapnia: Secondary | ICD-10-CM

## 2012-03-16 DIAGNOSIS — J90 Pleural effusion, not elsewhere classified: Secondary | ICD-10-CM

## 2012-03-16 DIAGNOSIS — I70219 Atherosclerosis of native arteries of extremities with intermittent claudication, unspecified extremity: Secondary | ICD-10-CM

## 2012-03-16 DIAGNOSIS — I5043 Acute on chronic combined systolic (congestive) and diastolic (congestive) heart failure: Secondary | ICD-10-CM | POA: Diagnosis present

## 2012-03-16 DIAGNOSIS — Z9189 Other specified personal risk factors, not elsewhere classified: Secondary | ICD-10-CM

## 2012-03-16 DIAGNOSIS — J811 Chronic pulmonary edema: Secondary | ICD-10-CM

## 2012-03-16 DIAGNOSIS — K439 Ventral hernia without obstruction or gangrene: Secondary | ICD-10-CM

## 2012-03-16 DIAGNOSIS — F17200 Nicotine dependence, unspecified, uncomplicated: Secondary | ICD-10-CM

## 2012-03-16 DIAGNOSIS — J449 Chronic obstructive pulmonary disease, unspecified: Secondary | ICD-10-CM

## 2012-03-16 DIAGNOSIS — E872 Acidosis, unspecified: Secondary | ICD-10-CM

## 2012-03-16 DIAGNOSIS — Z8673 Personal history of transient ischemic attack (TIA), and cerebral infarction without residual deficits: Secondary | ICD-10-CM

## 2012-03-16 DIAGNOSIS — I5041 Acute combined systolic (congestive) and diastolic (congestive) heart failure: Secondary | ICD-10-CM

## 2012-03-16 DIAGNOSIS — R918 Other nonspecific abnormal finding of lung field: Secondary | ICD-10-CM

## 2012-03-16 DIAGNOSIS — I472 Ventricular tachycardia: Secondary | ICD-10-CM

## 2012-03-16 DIAGNOSIS — I214 Non-ST elevation (NSTEMI) myocardial infarction: Secondary | ICD-10-CM

## 2012-03-16 DIAGNOSIS — I213 ST elevation (STEMI) myocardial infarction of unspecified site: Secondary | ICD-10-CM | POA: Diagnosis present

## 2012-03-16 DIAGNOSIS — J40 Bronchitis, not specified as acute or chronic: Secondary | ICD-10-CM

## 2012-03-16 DIAGNOSIS — I313 Pericardial effusion (noninflammatory): Secondary | ICD-10-CM

## 2012-03-16 DIAGNOSIS — I739 Peripheral vascular disease, unspecified: Secondary | ICD-10-CM

## 2012-03-16 DIAGNOSIS — E871 Hypo-osmolality and hyponatremia: Secondary | ICD-10-CM

## 2012-03-16 DIAGNOSIS — I255 Ischemic cardiomyopathy: Secondary | ICD-10-CM | POA: Diagnosis present

## 2012-03-16 DIAGNOSIS — J9600 Acute respiratory failure, unspecified whether with hypoxia or hypercapnia: Secondary | ICD-10-CM

## 2012-03-16 DIAGNOSIS — E785 Hyperlipidemia, unspecified: Secondary | ICD-10-CM

## 2012-03-16 DIAGNOSIS — I480 Paroxysmal atrial fibrillation: Secondary | ICD-10-CM | POA: Diagnosis not present

## 2012-03-16 DIAGNOSIS — Z720 Tobacco use: Secondary | ICD-10-CM

## 2012-03-16 DIAGNOSIS — I10 Essential (primary) hypertension: Secondary | ICD-10-CM | POA: Diagnosis present

## 2012-03-16 DIAGNOSIS — G9349 Other encephalopathy: Secondary | ICD-10-CM

## 2012-03-16 DIAGNOSIS — J810 Acute pulmonary edema: Secondary | ICD-10-CM

## 2012-03-16 DIAGNOSIS — J4489 Other specified chronic obstructive pulmonary disease: Secondary | ICD-10-CM

## 2012-03-16 DIAGNOSIS — R4182 Altered mental status, unspecified: Secondary | ICD-10-CM

## 2012-03-16 HISTORY — DX: Chronic obstructive pulmonary disease, unspecified: J44.9

## 2012-03-16 LAB — URINALYSIS, ROUTINE W REFLEX MICROSCOPIC
Bilirubin Urine: NEGATIVE
Ketones, ur: NEGATIVE mg/dL
Protein, ur: 100 mg/dL — AB
Urobilinogen, UA: 1 mg/dL (ref 0.0–1.0)

## 2012-03-16 LAB — HEPATIC FUNCTION PANEL
Alkaline Phosphatase: 65 U/L (ref 39–117)
Total Protein: 6.5 g/dL (ref 6.0–8.3)

## 2012-03-16 LAB — CBC WITH DIFFERENTIAL/PLATELET
Basophils Absolute: 0 10*3/uL (ref 0.0–0.1)
Basophils Relative: 0 % (ref 0–1)
Eosinophils Absolute: 0 10*3/uL (ref 0.0–0.7)
HCT: 43.9 % (ref 39.0–52.0)
MCH: 31.5 pg (ref 26.0–34.0)
MCHC: 34.6 g/dL (ref 30.0–36.0)
Neutro Abs: 17.2 10*3/uL — ABNORMAL HIGH (ref 1.7–7.7)
Neutrophils Relative %: 85 % — ABNORMAL HIGH (ref 43–77)
Platelets: 330 10*3/uL (ref 150–400)
RBC: 4.82 MIL/uL (ref 4.22–5.81)
RDW: 13.8 % (ref 11.5–15.5)
WBC: 20.4 10*3/uL — ABNORMAL HIGH (ref 4.0–10.5)

## 2012-03-16 LAB — BASIC METABOLIC PANEL
CO2: 34 mEq/L — ABNORMAL HIGH (ref 19–32)
Chloride: 82 mEq/L — ABNORMAL LOW (ref 96–112)
Creatinine, Ser: 0.93 mg/dL (ref 0.50–1.35)
GFR calc Af Amer: >90 mL/min (ref >90)
Sodium: 127 mEq/L — ABNORMAL LOW (ref 135–145)

## 2012-03-16 LAB — GLUCOSE, CAPILLARY: Glucose-Capillary: 132 mg/dL — ABNORMAL HIGH (ref 70–99)

## 2012-03-16 LAB — POCT I-STAT 3, ART BLOOD GAS (G3+)
Acid-Base Excess: 9 mmol/L — ABNORMAL HIGH (ref 0.0–2.0)
Patient temperature: 98.7
pH, Arterial: 7.546 — ABNORMAL HIGH (ref 7.350–7.450)

## 2012-03-16 LAB — URINE MICROSCOPIC-ADD ON

## 2012-03-16 LAB — TROPONIN I
Troponin I: <0.3 ng/mL (ref <0.30)
Troponin I: <0.3 ng/mL (ref <0.30)

## 2012-03-16 LAB — PRO B NATRIURETIC PEPTIDE: Pro B Natriuretic peptide (BNP): 2876 pg/mL — ABNORMAL HIGH (ref 0–125)

## 2012-03-16 LAB — MRSA PCR SCREENING: MRSA by PCR: NEGATIVE

## 2012-03-16 MED ORDER — ALBUTEROL SULFATE (5 MG/ML) 0.5% IN NEBU
10.0000 mg | INHALATION_SOLUTION | Freq: Once | RESPIRATORY_TRACT | Status: AC
  Administered 2012-03-16: 10 mg via RESPIRATORY_TRACT
  Filled 2012-03-16: qty 80

## 2012-03-16 MED ORDER — LIDOCAINE HCL (CARDIAC) 20 MG/ML IV SOLN
INTRAVENOUS | Status: AC
  Filled 2012-03-16: qty 5

## 2012-03-16 MED ORDER — FENTANYL CITRATE 0.05 MG/ML IJ SOLN
100.0000 ug | INTRAMUSCULAR | Status: DC | PRN
  Administered 2012-03-16 – 2012-03-17 (×5): 100 ug via INTRAVENOUS
  Filled 2012-03-16 (×5): qty 2

## 2012-03-16 MED ORDER — PNEUMOCOCCAL VAC POLYVALENT 25 MCG/0.5ML IJ INJ
0.5000 mL | INJECTION | INTRAMUSCULAR | Status: AC
  Administered 2012-03-17: 0.5 mL via INTRAMUSCULAR
  Filled 2012-03-16: qty 0.5

## 2012-03-16 MED ORDER — SUCCINYLCHOLINE CHLORIDE 20 MG/ML IJ SOLN
INTRAMUSCULAR | Status: AC
  Filled 2012-03-16: qty 10

## 2012-03-16 MED ORDER — SODIUM CHLORIDE 0.9 % IV SOLN
250.0000 mL | INTRAVENOUS | Status: DC | PRN
  Administered 2012-03-19: 250 mL via INTRAVENOUS

## 2012-03-16 MED ORDER — IPRATROPIUM BROMIDE 0.02 % IN SOLN
0.5000 mg | Freq: Once | RESPIRATORY_TRACT | Status: AC
  Administered 2012-03-16: 0.5 mg via RESPIRATORY_TRACT
  Filled 2012-03-16: qty 2.5

## 2012-03-16 MED ORDER — FENTANYL CITRATE 0.05 MG/ML IJ SOLN
200.0000 ug | Freq: Once | INTRAMUSCULAR | Status: AC
  Administered 2012-03-16: 100 ug via INTRAVENOUS

## 2012-03-16 MED ORDER — FENTANYL CITRATE 0.05 MG/ML IJ SOLN
INTRAMUSCULAR | Status: AC
  Filled 2012-03-16: qty 4

## 2012-03-16 MED ORDER — MIDAZOLAM HCL 2 MG/2ML IJ SOLN
1.0000 mg | INTRAMUSCULAR | Status: DC | PRN
  Administered 2012-03-16 – 2012-03-17 (×6): 2 mg via INTRAVENOUS
  Filled 2012-03-16 (×4): qty 2

## 2012-03-16 MED ORDER — ROCURONIUM BROMIDE 50 MG/5ML IV SOLN
INTRAVENOUS | Status: AC
  Filled 2012-03-16: qty 2

## 2012-03-16 MED ORDER — PANTOPRAZOLE SODIUM 40 MG IV SOLR
40.0000 mg | Freq: Every day | INTRAVENOUS | Status: DC
  Administered 2012-03-16: 40 mg via INTRAVENOUS
  Filled 2012-03-16 (×2): qty 40

## 2012-03-16 MED ORDER — SODIUM CHLORIDE 0.9 % IV SOLN
INTRAVENOUS | Status: DC
  Administered 2012-03-16: 500 mL via INTRAVENOUS
  Administered 2012-03-16: 1000 mL via INTRAVENOUS

## 2012-03-16 MED ORDER — FENTANYL CITRATE 0.05 MG/ML IJ SOLN
25.0000 ug | INTRAMUSCULAR | Status: DC | PRN
  Administered 2012-03-16: 100 ug via INTRAVENOUS
  Administered 2012-03-16: 50 ug via INTRAVENOUS

## 2012-03-16 MED ORDER — MIDAZOLAM HCL 2 MG/2ML IJ SOLN: 4.0000 mg | Freq: Once | INTRAMUSCULAR | Status: DC

## 2012-03-16 MED ORDER — IPRATROPIUM BROMIDE 0.02 % IN SOLN
0.5000 mg | Freq: Once | RESPIRATORY_TRACT | Status: AC
  Administered 2012-03-16: 0.5 mg via RESPIRATORY_TRACT
  Filled 2012-03-16 (×2): qty 2.5

## 2012-03-16 MED ORDER — FENTANYL CITRATE 0.05 MG/ML IJ SOLN
INTRAMUSCULAR | Status: AC
  Administered 2012-03-16: 100 ug via INTRAVENOUS
  Filled 2012-03-16: qty 2

## 2012-03-16 MED ORDER — DEXTROSE 5 % IV SOLN
500.0000 mg | Freq: Once | INTRAVENOUS | Status: AC
  Administered 2012-03-16: 500 mg via INTRAVENOUS
  Filled 2012-03-16: qty 500

## 2012-03-16 MED ORDER — FUROSEMIDE 10 MG/ML IJ SOLN: 40.0000 mg | Freq: Once | INTRAMUSCULAR | Status: DC

## 2012-03-16 MED ORDER — CHLORHEXIDINE GLUCONATE 0.12 % MT SOLN
15.0000 mL | Freq: Two times a day (BID) | OROMUCOSAL | Status: DC
  Administered 2012-03-16 – 2012-03-18 (×4): 15 mL via OROMUCOSAL
  Filled 2012-03-16 (×4): qty 15

## 2012-03-16 MED ORDER — INFLUENZA VIRUS VACC SPLIT PF IM SUSP
0.5000 mL | INTRAMUSCULAR | Status: AC
Start: 2012-03-17 — End: 2012-03-17
  Administered 2012-03-17: 0.5 mL via INTRAMUSCULAR
  Filled 2012-03-16: qty 0.5

## 2012-03-16 MED ORDER — ASPIRIN 81 MG PO CHEW: 324.0000 mg | CHEWABLE_TABLET | ORAL | Status: DC

## 2012-03-16 MED ORDER — HEPARIN SODIUM (PORCINE) 5000 UNIT/ML IJ SOLN
5000.0000 [IU] | Freq: Three times a day (TID) | INTRAMUSCULAR | Status: DC
  Administered 2012-03-16: 5000 [IU] via SUBCUTANEOUS
  Filled 2012-03-16 (×2): qty 1

## 2012-03-16 MED ORDER — DEXTROSE 5 % IV SOLN
1.0000 g | INTRAVENOUS | Status: DC
  Administered 2012-03-16 – 2012-03-19 (×4): 1 g via INTRAVENOUS
  Filled 2012-03-16 (×6): qty 10

## 2012-03-16 MED ORDER — ASPIRIN 300 MG RE SUPP: 300.0000 mg | RECTAL | Status: DC

## 2012-03-16 MED ORDER — BIOTENE DRY MOUTH MT LIQD
15.0000 mL | Freq: Four times a day (QID) | OROMUCOSAL | Status: DC
  Administered 2012-03-17 – 2012-03-18 (×7): 15 mL via OROMUCOSAL

## 2012-03-16 MED ORDER — MIDAZOLAM HCL 2 MG/2ML IJ SOLN
INTRAMUSCULAR | Status: AC
  Filled 2012-03-16: qty 4

## 2012-03-16 MED ORDER — METHYLPREDNISOLONE SODIUM SUCC 125 MG IJ SOLR
INTRAMUSCULAR | Status: AC
  Filled 2012-03-16: qty 2

## 2012-03-16 MED ORDER — MAGNESIUM SULFATE 40 MG/ML IJ SOLN
2.0000 g | Freq: Once | INTRAMUSCULAR | Status: AC
  Administered 2012-03-16: 2 g via INTRAVENOUS
  Filled 2012-03-16: qty 50

## 2012-03-16 MED ORDER — ETOMIDATE 2 MG/ML IV SOLN
INTRAVENOUS | Status: AC
  Administered 2012-03-16: 10 mg
  Filled 2012-03-16: qty 20

## 2012-03-16 MED ORDER — MIDAZOLAM HCL 2 MG/2ML IJ SOLN
INTRAMUSCULAR | Status: AC
  Administered 2012-03-16: 2 mg via INTRAVENOUS
  Filled 2012-03-16: qty 2

## 2012-03-16 MED ORDER — ALBUTEROL SULFATE (5 MG/ML) 0.5% IN NEBU
10.0000 mg | INHALATION_SOLUTION | Freq: Once | RESPIRATORY_TRACT | Status: AC
  Administered 2012-03-16: 10 mg via RESPIRATORY_TRACT
  Filled 2012-03-16: qty 2
  Filled 2012-03-16: qty 80

## 2012-03-16 NOTE — Procedures (Signed)
Central Venous Catheter Insertion Procedure Note [name] [name] [id number] 04-21-1941  Procedure: Insertion of Central Venous Catheter Indications: Assessment of intravascular volume and Drug and/or fluid administration  Procedure Details Consent: Risks of procedure as well as the alternatives and risks of each were explained to the (patient/caregiver).  Consent for procedure obtained. Time Out: Verified patient identification, verified procedure, site/side was marked, verified correct patient position, special equipment/implants available, medications/allergies/relevent history reviewed, required imaging and test results available.  Performed  Maximum sterile technique was used including antiseptics, cap, gloves, gown, hand hygiene, mask and sheet. Skin prep: Chlorhexidine; local anesthetic administered A antimicrobial bonded/coated triple lumen catheter was placed in the right subclavian vein using the Seldinger technique.  Evaluation Blood flow good Complications: No apparent complications Patient did tolerate procedure well. Chest X-ray ordered to verify placement.  CXR: pending.  U/S used in placement, picture in chart.  [name] [name] 03/16/2012, 1:34 PM

## 2012-03-16 NOTE — ED Notes (Signed)
Versed 2mg  given IV at 1226, verified with Regan Lemming, RN.

## 2012-03-16 NOTE — Progress Notes (Signed)
eLink Physician-Brief Progress Note Patient Name: FLORES BARRIERE DOB: [birth year] MRN: [id number]  Date of Service  03/16/2012   HPI/Events of Note  metab alkalosis on ABG   eICU Interventions  Lower RR to 12   Intervention Category Major Interventions: Acid-Base disturbance - evaluation and management  Atif Chapple V. 03/16/2012, 11:31 PM

## 2012-03-16 NOTE — ED Provider Notes (Addendum)
History     CSN: [id number]  Arrival date & time 03/16/12  4098   First MD Initiated Contact with Patient 03/16/12 [phone]      Chief Complaint  Patient presents with  . Shortness of Breath    (Consider location/radiation/quality/duration/timing/severity/associated sxs/prior treatment) The history is provided by the patient, a relative, medical records and the EMS personnel. History Limited By: Level V caveat: Respiratory distress.   it appears that the patient's had worsening shortness of breath with some alterations in his mental status over the past several days per family.  He was seen in another emergency department yesterday and was diagnosed with bronchitis.  He was instructed to followup with his doctor.  As reported that his breathing became worse today.  EMS found his oxygen saturations to be 84% on room air and the patient was initiated on BiPAP.  Albuterol and Solu-Medrol were given in route with improvement in his symptoms.  The patient reports his breathing is somewhat improved at this time.  Family reports that his mental status has improved as has his work of breathing.  There's been no documented fever over the past several days.  He does follow up with a pulmonologist.  He has a history of COPD by pulmonary function testing.  He continues to smoke cigarettes.  There is some reported left-sided arm pain.  He has no known coronary disease.  He has had no Swelling.  No history of DVT or pulmonary embolism  Past Medical History  Diagnosis Date  . Hypertension   . Hernia   . Stroke 2004    minor  . Hyperlipidemia   . Peripheral vascular disease   . Shoulder fracture, left     Past Surgical History  Procedure Date  . Bypass graft 07/2010    axillobifemoral BPG  . Spine surgery 11/23/06    microdiscectomy L5-S1  . Hemorrhoid surgery   . Tonsillectomy   . Hernia repair 02/09/11    [facility]    Family History  Problem Relation Age of Onset  . Alzheimer's disease Mother     . Cancer Father     prostate  . Cancer Brother     prostate  . Stroke Brother     History  Substance Use Topics  . Smoking status: Current Every Day Smoker -- 0.5 packs/day for 50 years    Types: Cigarettes  . Smokeless tobacco: Never Used  . Alcohol Use: Yes     2 drinks per day      Review of Systems  Unable to perform ROS   Allergies  Ceclor  Home Medications   Current Outpatient Rx  Name Route Sig Dispense Refill  . AMLODIPINE BESYLATE 10 MG PO TABS Oral Take 10 mg by mouth daily.      Marland Kitchen CLOPIDOGREL BISULFATE 75 MG PO TABS Oral Take 75 mg by mouth daily.    Marland Kitchen GABAPENTIN 600 MG PO TABS Oral Take 600 mg by mouth 2 (two) times daily.      Marland Kitchen HYDRALAZINE HCL 25 MG PO TABS Oral Take 25 mg by mouth 3 (three) times daily.      Marland Kitchen LOSARTAN POTASSIUM 50 MG PO TABS Oral Take 50 mg by mouth 2 (two) times daily.      Marland Kitchen NICOTINE 21 MG/24HR TD PT24 Transdermal Place 1 patch onto the skin daily.    . OXYCODONE-ACETAMINOPHEN 10-650 MG PO TABS Oral Take 1 tablet by mouth every 4 (four) hours as needed. For pain    .  POTASSIUM PHOSPHATE MONOBASIC 500 MG PO TABS Oral Take 500 mg by mouth 2 (two) times daily.      Marland Kitchen PRAVASTATIN SODIUM 40 MG PO TABS Oral Take 40 mg by mouth daily.      Marland Kitchen RISEDRONATE SODIUM 35 MG PO TABS Oral Take 35 mg by mouth every 7 (seven) days. Take on Wednesdays.  Take with water on empty stomach, nothing by mouth or lie down for next 30 minutes.    . TRAZODONE HCL 150 MG PO TABS Oral Take 150 mg by mouth at bedtime as needed. For sleep      BP 147/72  Pulse 87  Temp 97 F (36.1 C) (Axillary)  Resp 19  SpO2 84%  Physical Exam  Nursing note and vitals reviewed. Constitutional: He appears well-developed and well-nourished.  HENT:  Head: Normocephalic and atraumatic.  Eyes: EOM are normal.  Neck: Normal range of motion.  Cardiovascular: Normal rate, regular rhythm, normal heart sounds and intact distal pulses.   Pulmonary/Chest: He is in respiratory  distress. He has wheezes.       Significantly decreased expiratory phase.  No air movement into his lower lungs. Tachypnea.  Accessory muscle use.  CPAP in place  Abdominal: Soft. He exhibits no distension. There is no tenderness.  Musculoskeletal: Normal range of motion.  Neurological: He is alert.       Follows commands.  Moves all 4 extremities equally  Skin: Skin is warm and dry.  Psychiatric: He has a normal mood and affect. Judgment normal.    ED Course  Procedures (including critical care time)   Date: 03/16/2012  Rate: 96  Rhythm: normal sinus rhythm  QRS Axis: normal  Intervals: normal  ST/T Wave abnormalities: Nonspecific T wave changes Conduction Disutrbances: none  Narrative Interpretation:   Old EKG Reviewed: No significant changes noted  CRITICAL CARE Performed by: Lyanne Co Total critical care time: 35 Critical care time was exclusive of separately billable procedures and treating other patients. Critical care was necessary to treat or prevent imminent or life-threatening deterioration. Critical care was time spent personally by me on the following activities: development of treatment plan with patient and/or surrogate as well as nursing, discussions with consultants, evaluation of patient's response to treatment, examination of patient, obtaining history from patient or surrogate, ordering and performing treatments and interventions, ordering and review of laboratory studies, ordering and review of radiographic studies, pulse oximetry and re-evaluation of patient's condition.    Labs Reviewed  CBC WITH DIFFERENTIAL - Abnormal; Notable for the following:    WBC 20.4 (*)     Neutrophils Relative 85 (*)     Neutro Abs 17.2 (*)     Lymphocytes Relative 4 (*)     Monocytes Absolute 2.2 (*)     All other components within normal limits  BASIC METABOLIC PANEL - Abnormal; Notable for the following:    Sodium 127 (*)     Chloride 82 (*)     CO2 34 (*)      Glucose, Bld 131 (*)     GFR calc non Af Amer 83 (*)     All other components within normal limits  TROPONIN I  CULTURE, BLOOD (ROUTINE X 2)  CULTURE, BLOOD (ROUTINE X 2)  PRO B NATRIURETIC PEPTIDE   Dg Chest 2 View  03/15/2012  *RADIOLOGY REPORT*  Clinical Data: Cough, shortness of breath.  CHEST - 2 VIEW  Comparison: 08/23/2011  Findings: Cardiomegaly.  Mild diffuse interstitial prominence.  I suspect this  represents chronic interstitial lung disease although a component of early interstitial edema cannot be excluded.  No confluent opacities or effusions.  Progressive moderate compression fracture in the mid thoracic spine.  Diffuse osteopenia.  IMPRESSION: Cardiomegaly.  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   Original Report Authenticated By: Cyndie Chime, M.D.    Dg Chest Portable 1 View  03/16/2012  *RADIOLOGY REPORT*  Clinical Data: Shortness of breath.  Hypertension.  PORTABLE CHEST - 1 VIEW  Comparison: 03/15/2012  Findings: There is cardiomegaly.  Interstitial prominence throughout the lungs again noted, worsened since prior study. Suspect worsening interstitial edema.  Bibasilar atelectasis present.  Suspect small left effusion.  IMPRESSION: Worsening interstitial prominence, likely worsening interstitial edema.  Bibasilar atelectasis and small left effusion.   Original Report Authenticated By: Cyndie Chime, M.D.     I personally reviewed the imaging tests through PACS system  I reviewed available ER/hospitalization records thought the EMR   1. Respiratory failure   2. COPD (chronic obstructive pulmonary disease)       MDM  Patient presents with what appears to be acute respiratory failure.  He has required BiPAP by EMS.  He'll receive Solu-Medrol.  EMS reports his breathing is somewhat improved at this time.  He still has significantly decreased creases breath sounds in his bases.  His  work of breathing is still increased.  10:58 AM The patient continues to improve in the emergency department but still is appears to be a severe COPD exacerbation.  His white count 20,000 and therefore will cover her with antibiotics at this time.  It's unclear if this is diffuse edema versus diffuse infection.  BNP is pending at this time.  The patient will be admitted to the intensive care unit.  If he doesn't improve in the next several hours she may require a course of mechanical ventilation.  At this point with improved improving in the emergency department I will withhold.  I will involve intensivist.        Lyanne Co, MD 03/16/12 1101  Lyanne Co, MD 03/16/12 1102  12:30 PM Intubated by PCCM for worsening respiratory failure  Lyanne Co, MD 03/16/12 1230

## 2012-03-16 NOTE — Progress Notes (Signed)
eLink Physician-Brief Progress Note Patient Name: BARTLETT ENKE DOB: [birth year] MRN: [id number]  Date of Service  03/16/2012   HPI/Events of Note  agitation Pain likely  eICU Interventions  Increase fent   Intervention Category Major Interventions: Respiratory failure - evaluation and management Minor Interventions: Clinical assessment - ordering diagnostic tests  Nelda Bucks. 03/16/2012, 5:25 PM

## 2012-03-16 NOTE — Progress Notes (Signed)
eLink Physician-Brief Progress Note Patient Name: [name] [name] DOB: February 09, 1941 MRN: [id number]  Date of Service  03/16/2012   HPI/Events of Note   Line malplaced  eICU Interventions  Will pull back 4 cm    Intervention Category Minor Interventions: Clinical assessment - ordering diagnostic tests  Nelda Bucks. 03/16/2012, 9:13 PM

## 2012-03-16 NOTE — Progress Notes (Signed)
CRITICAL VALUE ALERT  Critical value received:  Troponin 0.63  Date of notification:  03/16/2012  Time of notification:  2157  Critical value read back:yes  Nurse who received alert:  Lorette Ang   MD notified (1st page):  Mcquaid  Time of first page:  2200  MD notified (2nd page):  Time of second page:  Responding MD:  Mcquaid  Time MD responded:  2200

## 2012-03-16 NOTE — ED Notes (Signed)
Per EMS, pt seen at [facility] yesterday and dx with bronchitis. BP 170/90 HR 90. Pt 84% on RA. 100% on Bi Pap. Pt lives alone and fell this am and nearly passed out because SOB.

## 2012-03-16 NOTE — H&P (Addendum)
Name: [name] [name] MRN: [id number] DOB: 29-Aug-1940    LOS: 0  Referring Provider:  EDP Reason for Referral:  Respiratory Failure     PULMONARY / CRITICAL CARE MEDICINE  HPI:  71 y/o WM, current smoker,  with PMH HTN, CVA (no residual), PVD, HLD, recent diagnosis of COPD, seen at [name] on 9/24 for increasing lethargy, difficulty breathing for two weeks.  Rx'd as bronchitis and sent home.  Am of 9/25 worsened respiratory distress, mental status- to ER with evaluation consistent with acute respiratory failure.  No improvement with BiPap and required intubation.  PCCM called for admit.    Past Medical History  Diagnosis Date  . Hypertension   . Hernia   . Stroke 2004    minor  . Hyperlipidemia   . Peripheral vascular disease   . Shoulder fracture, left    Past Surgical History  Procedure Date  . Bypass graft 07/2010    axillobifemoral BPG  . Spine surgery 11/23/06    microdiscectomy L5-S1  . Hemorrhoid surgery   . Tonsillectomy   . Hernia repair 02/09/11    [facility]   Prior to Admission medications   Medication Sig Start Date End Date Taking? Authorizing Provider  amLODipine (NORVASC) 10 MG tablet Take 10 mg by mouth daily.     Yes Historical Provider, MD  clopidogrel (PLAVIX) 75 MG tablet Take 75 mg by mouth daily.   Yes Historical Provider, MD  gabapentin (NEURONTIN) 600 MG tablet Take 600 mg by mouth 2 (two) times daily.     Yes Historical Provider, MD  hydrALAZINE (APRESOLINE) 25 MG tablet Take 25 mg by mouth 3 (three) times daily.     Yes Historical Provider, MD  losartan (COZAAR) 50 MG tablet Take 50 mg by mouth 2 (two) times daily.     Yes Historical Provider, MD  nicotine (NICODERM CQ - DOSED IN MG/24 HOURS) 21 mg/24hr patch Place 1 patch onto the skin daily.   Yes Historical Provider, MD  oxyCODONE-acetaminophen (PERCOCET) 10-650 MG per tablet Take 1 tablet by mouth every 4 (four) hours as needed. For pain 10/06/11  Yes Historical Provider, MD  potassium phosphate,  monobasic, (K-PHOS ORIGINAL) 500 MG tablet Take 500 mg by mouth 2 (two) times daily.     Yes Historical Provider, MD  pravastatin (PRAVACHOL) 40 MG tablet Take 40 mg by mouth daily.     Yes Historical Provider, MD  risedronate (ACTONEL) 35 MG tablet Take 35 mg by mouth every 7 (seven) days. Take on Wednesdays.  Take with water on empty stomach, nothing by mouth or lie down for next 30 minutes.   Yes Historical Provider, MD  traZODone (DESYREL) 150 MG tablet Take 150 mg by mouth at bedtime as needed. For sleep   Yes Historical Provider, MD   Allergies Allergies  Allergen Reactions  . Ceclor (Cefaclor) Other (See Comments)    Reaction unknown    Family History Family History  Problem Relation Age of Onset  . Alzheimer's disease Mother   . Cancer Father     prostate  . Cancer Brother     prostate  . Stroke Brother    Social History  reports that he has been smoking Cigarettes.  He has a 25 pack-year smoking history. He has never used smokeless tobacco. He reports that he drinks alcohol. He reports that he does not use illicit drugs.  Review Of Systems:  Unable to complete as patient is altered.    Events Since Admission:  9/25 - Admit, respiratory failure, intubated in ED   Vital Signs: Temp:  [97 F (36.1 C)] 97 F (36.1 C) (09/25 1016) Pulse Rate:  [72-100] 88  (09/25 1105) Resp:  [19-32] 19  (09/25 1030) BP: (122-166)/(61-106) 124/106 mmHg (09/25 1105) SpO2:  [84 %-100 %] 90 % (09/25 1105) FiO2 (%):  [40 %] 40 % (09/25 1105)  Physical Examination: General:  Elderly male in NAD Neuro:  Lethargic, arouses to name, falls back to sleep HEENT: mm pink/moist Cardiovascular:  s1s2 rrr, no m/r/g Lungs:  resp's shallow, labored prior to intubation on BiPap, diminished L>R Abdomen:  Round / soft, bsx4 active Musculoskeletal:  No acute deformities Skin:  Warm/dry, intact, no edema  Active Problems:  * No active hospital problems. *    ASSESSMENT AND PLAN  PULMONARY No  results found for this basename: PHART:5,PCO2:5,PCO2ART:5,PO2ART:5,HCO3:5,O2SAT:5 in the last 168 hours Ventilator Settings: Vent Mode:  [-]  FiO2 (%):  [40 %] 40 % CXR:  9/25>>>mild cephalization, no acute infiltrate ETT:  9/25>>>  A:   Acute respiratory failure - in setting of COPD exacerbation, home narcotic / sedating medications COPD - diagnosed with COPD in 4/13 (saw Dr. Delton Coombes)  P:   -now intubation, 8cc/kg -pcxr post intubation -scheduled nebs -solu-medrol IV -abx per ID section  CARDIOVASCULAR  Lab 03/16/12 0939  TROPONINI <0.30  LATICACIDVEN --  PROBNP 2876.0*   ECG:  9/25>>> Lines:   9/25 TLC >>>R [place] TLC 9/25 R radial a-line  A:  Sinus Rhythm - one episode bradycardia in ED, spontaneously resolved Hx of HTN - on home norvasc 10mg , cozaar, hydralazine Hx of HLD - on pravachol  P:  -ICU admit, tele monitor -assess EKG -cycle troponin -hold home anti-hypertensive regimen -Lasix as ordered.  RENAL  Lab 03/16/12 0937  NA 127*  K 4.0  CL 82*  CO2 34*  BUN 14  CREATININE 0.93  CALCIUM 9.6  MG --  PHOS --   Intake/Output    None    Foley:  9/25>>>  A:   Hyponatremia  Hypochloremia  P:   -lasix x1 -KVO IVF   GASTROINTESTINAL No results found for this basename: AST:5,ALT:5,ALKPHOS:5,BILITOT:5,PROT:5,ALBUMIN:5 in the last 168 hours  A:   NPO  P:   -consider TF in am 9/26 if remains intubated  HEMATOLOGIC  Lab 03/16/12 0937  HGB 15.2  HCT 43.9  PLT 330  INR --  APTT --   A:  No acute issues  P:  -monitor CBC  INFECTIOUS  Lab 03/16/12 0937  WBC 20.4*  PROCALCITON --   Cultures: 9/25 BCx2>>> 9/25 UA>>> 9/25 Sputum>>> 9/25 U. Strep>>> 9/25 U. Legion>>>  Antibiotics: Avelox (COPD exax)>>>  A:   Acute exacerbation of COPD  P:   -abx as above -cultures as above -follow cxr  ENDOCRINE No results found for this basename: GLUCAP:5 in the last 168 hours A:   No acute issues  P:   -CBG's Q6 while  NPO  NEUROLOGIC  A:   Encephalopathy - in setting of AECOPD Hx of CVA - on plavix P:   -supportive care, should resolve with correction of respiratory issues -hold plavix, consider restart in am    BEST PRACTICE / DISPOSITION Level of Care:  ICU Primary Service:  PCCM Consultants:   Code Status:  LCB, no CPR and no cardioversion. Diet:  NPO DVT Px:  Heparin GI Px:  Protonix Skin Integrity:  Intact Social / Family:  Updated at bedside    Canary Brim, NP-C  [facility] Pgr: [phone] or [phone]   03/16/2012, 12:16 PM   Spoke with the family extensively over 3 occasions, after discussion, explained the case at length and all (son, daughter and grand-daughter) were in agreement that patient would not want to be resuscitated (LCB with no CPR and no cardioversion).  Will take a few days to think about trach/peg...etc. But for now LCB and treat otherwise.  CC time 90 min.  Patient seen and examined, agree with above note.  I dictated the care and orders written for this patient under my direction.  [name], M.D. [phone]

## 2012-03-16 NOTE — Procedures (Signed)
Intubation Procedure Note [name] [name] [id number] October 17, 1940  Procedure: Intubation Indications: Respiratory insufficiency  Procedure Details Consent: Risks of procedure as well as the alternatives and risks of each were explained to the (patient/caregiver).  Consent for procedure obtained. Time Out: Verified patient identification, verified procedure, site/side was marked, verified correct patient position, special equipment/implants available, medications/allergies/relevent history reviewed, required imaging and test results available.  Performed  Maximum sterile technique was used including antiseptics, cap, gloves and mask.  MAC and 4    Evaluation Hemodynamic Status: BP stable throughout; O2 sats: stable throughout Patient's Current Condition: stable Complications: No apparent complications Patient did tolerate procedure well. Chest X-ray ordered to verify placement.  CXR: pending.  I was present and supervised the intubation.  Patient seen and examined, agree with above note.  I dictated the care and orders written for this patient under my direction.  [name], M.D. [phone]

## 2012-03-16 NOTE — Procedures (Signed)
Arterial Catheter Insertion Procedure Note [name] [name] [id number] 1941/06/14  Procedure: Insertion of Arterial Catheter  Indications: Blood pressure monitoring  Procedure Details Consent: Risks of procedure as well as the alternatives and risks of each were explained to the (patient/caregiver).  Consent for procedure obtained. Time Out: Verified patient identification, verified procedure, site/side was marked, verified correct patient position, special equipment/implants available, medications/allergies/relevent history reviewed, required imaging and test results available.  Performed  Maximum sterile technique was used including antiseptics, cap, gloves, gown, hand hygiene, mask and sheet. Skin prep: Chlorhexidine; local anesthetic administered 20 gauge catheter was inserted into right radial artery using the Seldinger technique.  Evaluation Blood flow good; BP tracing good. Complications: No apparent complications.   [name] [name] 03/16/2012

## 2012-03-16 NOTE — Progress Notes (Signed)
Found patient on Rate of 12 with no new order or Physician note. RT increased Respiratory Rate to 16 per order.  [name] [name] Tiger RRT,RCP

## 2012-03-17 ENCOUNTER — Inpatient Hospital Stay (HOSPITAL_COMMUNITY): Payer: Medicare Other

## 2012-03-17 ENCOUNTER — Encounter (HOSPITAL_COMMUNITY)
Admission: EM | Disposition: A | Discharge status: Home / Self Care | Payer: Self-pay | Source: Home / Self Care | Attending: Cardiovascular Disease

## 2012-03-17 ENCOUNTER — Ambulatory Visit (HOSPITAL_COMMUNITY): Admit: 2012-03-17 | Payer: Self-pay | Admitting: Interventional Cardiology

## 2012-03-17 HISTORY — PX: PERCUTANEOUS CORONARY STENT INTERVENTION (PCI-S): SHX5485

## 2012-03-17 HISTORY — PX: CORONARY ANGIOGRAM: SHX5466

## 2012-03-17 LAB — CBC
Hemoglobin: 12.6 g/dL — ABNORMAL LOW (ref 13.0–17.0)
MCH: 30.6 pg (ref 26.0–34.0)
Platelets: 294 10*3/uL (ref 150–400)
RBC: 4.12 MIL/uL — ABNORMAL LOW (ref 4.22–5.81)
WBC: 21.4 10*3/uL — ABNORMAL HIGH (ref 4.0–10.5)

## 2012-03-17 LAB — GLUCOSE, CAPILLARY
Glucose-Capillary: 139 mg/dL — ABNORMAL HIGH (ref 70–99)
Glucose-Capillary: 120 mg/dL — ABNORMAL HIGH (ref 70–99)
Glucose-Capillary: 114 mg/dL — ABNORMAL HIGH (ref 70–99)
Glucose-Capillary: 114 mg/dL — ABNORMAL HIGH (ref 70–99)

## 2012-03-17 LAB — POCT ACTIVATED CLOTTING TIME: Activated Clotting Time: 334 seconds

## 2012-03-17 LAB — POCT I-STAT 3, ART BLOOD GAS (G3+)
Bicarbonate: 31.6 mEq/L — ABNORMAL HIGH (ref 20.0–24.0)
Bicarbonate: 29 mEq/L — ABNORMAL HIGH (ref 20.0–24.0)
O2 Saturation: 91 %
Patient temperature: 98.6
Patient temperature: 36.8
TCO2: 30 mmol/L (ref 0–100)
pCO2 arterial: 50.6 mmHg — ABNORMAL HIGH (ref 35.0–45.0)
pCO2 arterial: 52 mmHg — ABNORMAL HIGH (ref 35.0–45.0)
pH, Arterial: 7.475 — ABNORMAL HIGH (ref 7.350–7.450)
pH, Arterial: 7.392 (ref 7.350–7.450)
pH, Arterial: 7.439 (ref 7.350–7.450)

## 2012-03-17 LAB — CK TOTAL AND CKMB (NOT AT ARMC): Relative Index: 3.9 — ABNORMAL HIGH (ref 0.0–2.5)

## 2012-03-17 LAB — BASIC METABOLIC PANEL
CO2: 29 mEq/L (ref 19–32)
CO2: 31 mEq/L (ref 19–32)
Calcium: 8.8 mg/dL (ref 8.4–10.5)
Calcium: 8.6 mg/dL (ref 8.4–10.5)
Chloride: 87 mEq/L — ABNORMAL LOW (ref 96–112)
Chloride: 86 mEq/L — ABNORMAL LOW (ref 96–112)
Glucose, Bld: 133 mg/dL — ABNORMAL HIGH (ref 70–99)
Potassium: 4.1 mEq/L (ref 3.5–5.1)
Potassium: 4 mEq/L (ref 3.5–5.1)
Sodium: 127 mEq/L — ABNORMAL LOW (ref 135–145)
Sodium: 126 mEq/L — ABNORMAL LOW (ref 135–145)

## 2012-03-17 LAB — LIPID PANEL
Cholesterol: 123 mg/dL (ref 0–200)
HDL: 48 mg/dL (ref >39)
LDL Cholesterol: 58 mg/dL (ref 0–99)
Triglycerides: 85 mg/dL (ref <150)
VLDL: 17 mg/dL (ref 0–40)

## 2012-03-17 LAB — TROPONIN I
Troponin I: 0.73 ng/mL (ref <0.30)
Troponin I: >20 ng/mL (ref <0.30)
Troponin I: >20 ng/mL (ref <0.30)

## 2012-03-17 LAB — CORTISOL: Cortisol, Plasma: 20.5 ug/dL

## 2012-03-17 LAB — LEGIONELLA ANTIGEN, URINE

## 2012-03-17 SURGERY — CORONARY ANGIOGRAM
Anesthesia: LOCAL

## 2012-03-17 MED ORDER — AMIODARONE HCL IN DEXTROSE 360-4.14 MG/200ML-% IV SOLN
60.0000 mg/h | INTRAVENOUS | Status: AC
  Administered 2012-03-17: 60 mg/h via INTRAVENOUS
  Filled 2012-03-17 (×2): qty 200

## 2012-03-17 MED ORDER — MIDAZOLAM HCL 2 MG/2ML IJ SOLN
INTRAMUSCULAR | Status: AC
  Filled 2012-03-17: qty 2

## 2012-03-17 MED ORDER — FENTANYL CITRATE 0.05 MG/ML IJ SOLN
INTRAMUSCULAR | Status: AC
  Filled 2012-03-17: qty 2

## 2012-03-17 MED ORDER — AMLODIPINE BESYLATE 10 MG PO TABS
10.0000 mg | ORAL_TABLET | Freq: Every day | ORAL | Status: DC
  Filled 2012-03-17: qty 1

## 2012-03-17 MED ORDER — ASPIRIN 81 MG PO CHEW
81.0000 mg | CHEWABLE_TABLET | Freq: Every day | ORAL | Status: DC
  Administered 2012-03-17 – 2012-04-06 (×21): 81 mg via ORAL
  Filled 2012-03-17 (×21): qty 1

## 2012-03-17 MED ORDER — HEPARIN (PORCINE) IN NACL 2-0.9 UNIT/ML-% IJ SOLN
INTRAMUSCULAR | Status: AC
  Filled 2012-03-17: qty 2000

## 2012-03-17 MED ORDER — BIVALIRUDIN 250 MG IV SOLR
INTRAVENOUS | Status: AC
  Filled 2012-03-17: qty 250

## 2012-03-17 MED ORDER — METOPROLOL TARTRATE 12.5 MG HALF TABLET
12.5000 mg | ORAL_TABLET | Freq: Two times a day (BID) | ORAL | Status: DC
  Administered 2012-03-17 – 2012-03-18 (×3): 12.5 mg via ORAL
  Filled 2012-03-17 (×4): qty 1

## 2012-03-17 MED ORDER — CLOPIDOGREL BISULFATE 75 MG PO TABS
75.0000 mg | ORAL_TABLET | Freq: Every day | ORAL | Status: DC
  Administered 2012-03-17 – 2012-04-06 (×21): 75 mg via ORAL
  Filled 2012-03-17 (×22): qty 1

## 2012-03-17 MED ORDER — HEPARIN BOLUS VIA INFUSION
3000.0000 [IU] | Freq: Once | INTRAVENOUS | Status: AC
  Administered 2012-03-17: 3000 [IU] via INTRAVENOUS
  Filled 2012-03-17: qty 3000

## 2012-03-17 MED ORDER — HYDRALAZINE HCL 25 MG PO TABS
25.0000 mg | ORAL_TABLET | Freq: Three times a day (TID) | ORAL | Status: DC
  Filled 2012-03-17 (×2): qty 1

## 2012-03-17 MED ORDER — SODIUM CHLORIDE 0.9 % IV SOLN
INTRAVENOUS | Status: DC
  Administered 2012-03-17: 21:00:00 via INTRAVENOUS
  Administered 2012-03-18: 20 mL/h via INTRAVENOUS
  Administered 2012-03-20: 08:00:00 via INTRAVENOUS

## 2012-03-17 MED ORDER — GABAPENTIN 600 MG PO TABS
600.0000 mg | ORAL_TABLET | Freq: Two times a day (BID) | ORAL | Status: DC
  Administered 2012-03-17 – 2012-04-06 (×39): 600 mg via ORAL
  Filled 2012-03-17 (×43): qty 1

## 2012-03-17 MED ORDER — SODIUM CHLORIDE 0.9 % IV SOLN: 1.0000 mL/kg/h | INTRAVENOUS | Status: AC

## 2012-03-17 MED ORDER — ACETAMINOPHEN 325 MG PO TABS
650.0000 mg | ORAL_TABLET | ORAL | Status: DC | PRN
  Administered 2012-03-20 – 2012-04-02 (×11): 650 mg via ORAL
  Filled 2012-03-17 (×12): qty 2

## 2012-03-17 MED ORDER — PANTOPRAZOLE SODIUM 40 MG PO PACK
40.0000 mg | PACK | ORAL | Status: DC
  Administered 2012-03-17: 40 mg
  Filled 2012-03-17 (×2): qty 20

## 2012-03-17 MED ORDER — HEPARIN (PORCINE) IN NACL 100-0.45 UNIT/ML-% IJ SOLN
1000.0000 [IU]/h | INTRAMUSCULAR | Status: DC
  Administered 2012-03-17: 850 [IU]/h via INTRAVENOUS
  Administered 2012-03-18: 1000 [IU]/h via INTRAVENOUS
  Filled 2012-03-17 (×2): qty 250

## 2012-03-17 MED ORDER — LOSARTAN POTASSIUM 50 MG PO TABS
50.0000 mg | ORAL_TABLET | Freq: Two times a day (BID) | ORAL | Status: DC
  Filled 2012-03-17: qty 1

## 2012-03-17 MED ORDER — LIDOCAINE HCL (PF) 1 % IJ SOLN
INTRAMUSCULAR | Status: AC
  Filled 2012-03-17: qty 30

## 2012-03-17 MED ORDER — HEPARIN (PORCINE) IN NACL 100-0.45 UNIT/ML-% IJ SOLN
850.0000 [IU]/h | INTRAMUSCULAR | Status: DC
  Administered 2012-03-17: 850 [IU]/h via INTRAVENOUS
  Filled 2012-03-17: qty 250

## 2012-03-17 MED ORDER — OXYCODONE-ACETAMINOPHEN 5-325 MG PO TABS: 2.0000 | ORAL_TABLET | ORAL | Status: DC | PRN

## 2012-03-17 MED ORDER — NITROGLYCERIN 0.2 MG/ML ON CALL CATH LAB
INTRAVENOUS | Status: AC
  Filled 2012-03-17: qty 1

## 2012-03-17 MED ORDER — CLOPIDOGREL BISULFATE 75 MG PO TABS: 75.0000 mg | ORAL_TABLET | Freq: Every day | ORAL | Status: DC

## 2012-03-17 MED ORDER — SODIUM CHLORIDE 0.9 % IV SOLN
0.2500 mg/kg/h | Freq: Once | INTRAVENOUS | Status: AC
  Administered 2012-03-17: 0.25 mg/kg/h via INTRAVENOUS
  Filled 2012-03-17: qty 250

## 2012-03-17 MED ORDER — POTASSIUM PHOSPHATE MONOBASIC 500 MG PO TABS
500.0000 mg | ORAL_TABLET | Freq: Two times a day (BID) | ORAL | Status: DC
  Administered 2012-03-17 – 2012-03-23 (×14): 500 mg via ORAL
  Filled 2012-03-17 (×13): qty 1

## 2012-03-17 MED ORDER — AMIODARONE LOAD VIA INFUSION
150.0000 mg | Freq: Once | INTRAVENOUS | Status: AC
  Administered 2012-03-17: 150 mg via INTRAVENOUS
  Filled 2012-03-17: qty 83.34

## 2012-03-17 MED ORDER — ONDANSETRON HCL 4 MG/2ML IJ SOLN: 4.0000 mg | Freq: Four times a day (QID) | INTRAMUSCULAR | Status: DC | PRN

## 2012-03-17 MED ORDER — SIMVASTATIN 40 MG PO TABS
40.0000 mg | ORAL_TABLET | Freq: Every day | ORAL | Status: DC
  Administered 2012-03-17: 40 mg via ORAL
  Filled 2012-03-17 (×3): qty 1

## 2012-03-17 MED ORDER — NICOTINE 21 MG/24HR TD PT24
21.0000 mg | MEDICATED_PATCH | TRANSDERMAL | Status: DC
  Filled 2012-03-17: qty 1

## 2012-03-17 MED ORDER — TRAZODONE HCL 150 MG PO TABS: 150.0000 mg | ORAL_TABLET | Freq: Every evening | ORAL | Status: DC | PRN

## 2012-03-17 MED ORDER — IPRATROPIUM-ALBUTEROL 18-103 MCG/ACT IN AERO
4.0000 | INHALATION_SPRAY | Freq: Four times a day (QID) | RESPIRATORY_TRACT | Status: DC
  Administered 2012-03-17 – 2012-03-18 (×5): 4 via RESPIRATORY_TRACT
  Filled 2012-03-17: qty 14.7

## 2012-03-17 MED ORDER — VERAPAMIL HCL 2.5 MG/ML IV SOLN
INTRAVENOUS | Status: AC
  Filled 2012-03-17: qty 2

## 2012-03-17 MED ORDER — AMIODARONE HCL IN DEXTROSE 360-4.14 MG/200ML-% IV SOLN
30.0000 mg/h | INTRAVENOUS | Status: DC
  Administered 2012-03-17 – 2012-03-19 (×6): 30 mg/h via INTRAVENOUS
  Filled 2012-03-17 (×11): qty 200

## 2012-03-17 MED ORDER — SODIUM CHLORIDE 0.9 % IV SOLN
10.0000 ug/h | INTRAVENOUS | Status: DC
  Administered 2012-03-17: 50 ug/h via INTRAVENOUS
  Filled 2012-03-17: qty 50

## 2012-03-17 MED ORDER — NICOTINE 7 MG/24HR TD PT24
7.0000 mg | MEDICATED_PATCH | TRANSDERMAL | Status: DC
  Administered 2012-03-17 – 2012-03-25 (×9): 7 mg via TRANSDERMAL
  Filled 2012-03-17 (×11): qty 1

## 2012-03-17 NOTE — Progress Notes (Addendum)
ANTICOAGULATION CONSULT NOTE - Follow Up Consult  Pharmacy consult for heparin Indication: poor flow s/p cath  Allergies  Allergen Reactions  . Ceclor (Cefaclor) Other (See Comments)    Reaction unknown    Patient Measurements: Height: 5\' 6"  (167.6 cm) Weight: 136 lb 11 oz (62 kg) IBW/kg (Calculated) : 63.8    Vital Signs: Temp: 98.2 F (36.8 C) (09/26 1300) Temp src: Core (Comment) (09/26 1100) BP: 106/63 mmHg (09/26 1300) Pulse Rate: 73  (09/26 1300)  Labs:  Basename 03/17/12 0818 03/17/12 0530 03/17/12 0040 03/16/12 2105 03/16/12 0937  HGB -- 12.6* -- -- 15.2  HCT -- 37.0* -- -- 43.9  PLT -- 294 -- -- 330  APTT -- -- -- -- --  LABPROT -- -- -- -- --  INR -- -- -- -- --  HEPARINUNFRC -- -- -- -- --  CREATININE -- 0.94 0.92 -- 0.93  CKTOTAL -- -- 149 -- --  CKMB -- -- 5.8* -- --  TROPONINI >20.00* -- 0.73* 0.63* --    Estimated Creatinine Clearance: 64.1 ml/min (by C-G formula based on Cr of 0.94).   Medications:  Prescriptions prior to admission  Medication Sig Dispense Refill  . amLODipine (NORVASC) 10 MG tablet Take 10 mg by mouth daily.        . clopidogrel (PLAVIX) 75 MG tablet Take 75 mg by mouth daily.      Marland Kitchen gabapentin (NEURONTIN) 600 MG tablet Take 600 mg by mouth 2 (two) times daily.        . hydrALAZINE (APRESOLINE) 25 MG tablet Take 25 mg by mouth 3 (three) times daily.        Marland Kitchen losartan (COZAAR) 50 MG tablet Take 50 mg by mouth 2 (two) times daily.        . nicotine (NICODERM CQ - DOSED IN MG/24 HOURS) 21 mg/24hr patch Place 1 patch onto the skin daily.      Marland Kitchen oxyCODONE-acetaminophen (PERCOCET) 10-650 MG per tablet Take 1 tablet by mouth every 4 (four) hours as needed. For pain      . potassium phosphate, monobasic, (K-PHOS ORIGINAL) 500 MG tablet Take 500 mg by mouth 2 (two) times daily.        . pravastatin (PRAVACHOL) 40 MG tablet Take 40 mg by mouth daily.        . risedronate (ACTONEL) 35 MG tablet Take 35 mg by mouth every 7 (seven) days.  Take on Wednesdays.  Take with water on empty stomach, nothing by mouth or lie down for next 30 minutes.      . traZODone (DESYREL) 150 MG tablet Take 150 mg by mouth at bedtime as needed. For sleep        Assessment: 71 y.o male presented with STEMI  now s/p cath. Received 1 DES however had continued poor flow and was continued on bivalrudin post cath. Rx consult to dose heparin after bivalrudin finished running.  Goal of Therapy:  Heparin level 0.3-0.7 units/ml Monitor platelets by anticoagulation protocol: Yes   Plan:  1.Will restart heparin at previous drip rate 850 units/hour without bolus 2.Follow up 8 hour heparin level 3.Monitor for s/sx of bleeding and CBC  Bola A. Wandra Feinstein D Clinical Pharmacist Pager:[phone] Phone [phone] 03/17/2012 3:51 PM

## 2012-03-17 NOTE — Progress Notes (Signed)
eLink Physician-Brief Progress Note Patient Name: [name] [name] DOB: 1941/04/17 MRN: [id number]  Date of Service  03/17/2012   HPI/Events of Note     eICU Interventions  Cont fent gtt post cath   Intervention Category Minor Interventions: Agitation / anxiety - evaluation and management  ALVA,RAKESH V. 03/17/2012, 5:23 AM

## 2012-03-17 NOTE — Op Note (Addendum)
PROCEDURE:  selective coronary angiography, PCI RCA, right subclavian artery angiography  INDICATIONS:  Acute inferior ST elevation MI  The risks, benefits, and details of the procedure were explained to the patient's daughter.  The patient verbalized understanding and wanted to proceed.  Informed written consent was obtained.  PROCEDURE TECHNIQUE:  After Xylocaine anesthesia a 77F sheath was placed in the right radial artery by exchanging the arterial line over a wire.   Left coronary angiography was done using a Judkins L3.5 guide catheter.  Right coronary angiography was done using a Judkins R4 guide catheter.   There was a lot of difficulty getting access to the ascending aorta.  The woolly wire would not traverse through the subclavian artery due to significant tortuosity and calcification.  Several selective angiograms were performed through the jail 3.5 catheter and the JR 4 catheter.  Eventually, a Terumo glide advantage wire was used and a catheter was able to traverse through the heavily calcified subclavian ostium.  There was a 20 mm Hg gradient across the ostium of the subclavian.  New  The diagnostic catheterization was performed.  Angiomax was used for anticoagulation.  An ACT was used to confirm that the Angiomax is therapeutic.  The intervention was then performed please see below for details.  The sheath was removed and a TR band was applied for hemostasis.  This was a complicated case which took over two and a half hours due to the tortuousity, PVD, calcification, while a normal case would have been half of that time.  The patient was intubated as well.     CONTRAST:  Total of 140 cc.  COMPLICATIONS:  None.    HEMODYNAMICS:  Aortic pressure was 126/65;   ANGIOGRAPHIC DATA:   The left main coronary artery is large vessel with a 25% ostial stenosis.  The left anterior descending artery is a large vessel proximally and appears heavily calcified.  There are diffuse mild to moderate  areas of atherosclerosis.  There is a large second diagonal.  The first diagonal is medium in size.  The mid to distal LAD is medium-sized and widely patent.  There are faint left to right collaterals from the septal perforator branches.  The left circumflex artery is a medium-sized vessel.  There is a medium-sized OM 1 which has mild luminal regularities.  The remainder of the circumflex and medium-sized OM to also have mild luminal irregularities..  The right coronary artery is a heavily calcified, large dominant vessel.  There is moderate atherosclerosis in the midportion.  There is significant tortuosity in the mid section as well.  In the distal RCA, there is a heavily calcified 99% stenosis followed by a more moderate 60% stenosis.  The posterior lateral artery is a large vessel with mild atherosclerosis.  The posterior descending artery is a medium-sized vessel with mild atherosclerosis as well.  LEFT VENTRICULOGRAM:  Left ventricular angiogram was not performed.  SUBCLAVIAN ANGIOGRAM: The right subclavian artery is widely patent in the midportion.  There is severe tortuosity.  There is heavy calcification in the ostium with a moderate, hazy stenosis.  There is a 20 mm gradient across the proximal subclavian.  The aortic arch was heavily calcified.  PCI NARRATIVE: A JR 4 guiding catheters used to engage the right coronary artery.  A pro-water wire was advanced to the distal RCA but did not cross the lesion.  A BMW wire was tried as well but this would not cross the lesion.  Eventually, a Nature conservation officer  was used to cross the stenosis in the distal right coronary artery.  A 2.5 x 15 balloon was advanced to the distal vessel and inflated several times up to 14 atmospheres.  There is improvement in the stenosis but still a significant stenosis, particularly at the subtotally occluded portion.  This appeared heavily calcified.  A 2.25 x 23 expedition stent was advanced, but was not long enough to cover  the entire lesion.  It was difficult to get the stent to traverse through the tortuosity and heavy calcification.  Therefore, we deployed this stent at 20 atmospheres in the distal portion of disease.  We then attempted to advance a 2.5 x 15 science stent to overlap proximally and cover the entire area disease.  We're unsuccessful in getting the stent to overlap.  Several attempts were made with 2 Wires in Pl. as a pro water was added.  These were unsuccessful.  As these stents were removed, the shafts of these items were completely coiled due to the tortuosity.  We again predilated that lesion in the distal RCA.  An Ironman wire was added.  The stent would still not get through the tortuosity in the mid vessel.  It was difficult to maintain guide position as well, likely due to the tortuosity of the subclavian.  We attempted to advance the guide liner for more support but this would not navigate through the tortuosity in the subclavian.  At that point, it was noted that the patient's ST segments became significantly more elevated than they had been previously.  An angiogram revealed a proximal dissection, likely caused by the guide catheter.  The [facility] wire was still in place.  A 2.5 x 15 balloon was inflated several times throughout the vessel.  It was also used to post dilate the distal RCA stent. There is a moderate increase in flow distally.  A 3.0 x 23 Expedition stent  was deployed proximally.  The hope was that by covering the initial entry site of dissection, this would improve flow distally.  This did not occur.  We then re ballooned the entire RCA with a 2.5 x 12 emerge balloon.  There was TIMI 1 flow but still significant residual ST elevation.  He tried to advance another stent to the midportion of the vessel but were unsuccessful to navigate even the proximal RCA.  The patient did have a transient episode of ventricular tachycardia but did not require cardioversion.  He spontaneously converted to  normal sinus rhythm.  During the procedure, multiple doses of Versed and fentanyl were given to the patient since he was intubated.  He remained hemodynamically stable despite the inferior ischemia noted on ECG.    At this point, there did not appear to be a way to salvage a good result percutaneously.  I do not think he was a surgical candidate given his heavily calcified aorta, occluded bilateral iliacs and COPD requiring intubation.  We decided to medically manage him at this point.   IMPRESSIONS:  1. Mild to moderate left-sided disease without apparent hemodynamically significant lesion. 2. Subtotally, occluded distal right coronary artery.  The entire vessel was heavily calcified.  This vessel was likely closed temporarily causing the transient ST elevation noted on prior ECG and at the very beginning of the case.  Drug-eluting stent, 2.25 x 23 expedition stent deployed in the distal RCA.  Due to catheter-induced proximal RCA dissection, a 3.0 x 23 expedition stent was deployed in the proximal vessel.  Balloon angioplasty to  the entire RCA post dissection.  This only resulted in TIMI 1 flow.  There were faint left to right collaterals at the beginning of the case.  RECOMMENDATION:  Medical therapy.  The patient we watched in the ICU.  Continue Angiomax for the remainder of the bag that is present.  He is already on Plavix.  I would recommend that he stay intubated for at least another day so that his pain can be adequately controlled.  Echocardiogram to evaluate left ventricular function.  Anticipate severe inferior wall hypokinesis.  We'll have to manage fluids accordingly.  He will need beta blocker, aspirin, Plavix, statin and eventually an ACE inhibitor.  He needs to spouse smoking.  These findings were all discussed with the patient's daughter.  Further cardiac care per Dr. Allyson Sabal.

## 2012-03-17 NOTE — Care Management Note (Unsigned)
    Page 1 of 2   03/24/2012     1:19:56 PM   CARE MANAGEMENT NOTE 03/24/2012  Patient:  [name] [name], [name] [name]   Account Number:  [id number]  Date Initiated:  03/17/2012  Documentation initiated by:  Junius Creamer  Subjective/Objective Assessment:   adm w resp failure     Action/Plan:   lives alone, pcp dr Andrey Campanile elkins   Anticipated DC Date:  03/22/2012   Anticipated DC Plan:  HOME/SELF CARE      DC Planning Services  CM consult      PAC Choice  DURABLE MEDICAL EQUIPMENT   Choice offered to / List presented to:     DME arranged  OXYGEN      DME agency  [facility]     HH arranged  HH-1 RN  HH-2 PT      [facility] agency  [facility]   Status of service:  In process, will continue to follow Medicare Important Message given?   (If response is "NO", the following Medicare IM given date fields will be blank) Date Medicare IM given:   Date Additional Medicare IM given:    Discharge Disposition:    Per UR Regulation:  Reviewed for med. necessity/level of care/duration of stay  If discussed at Long Length of Stay Meetings, dates discussed:   03/22/2012    Comments:  9/26 9am debbie dowell rn,bsn [phone]  Contact:  Traci Deal, dtr/HCPOA #[phone]   03/24/12  0831  Aishani Kalis SIMMONS RN, BSN [phone] REFERRAL PLACED TO [facility] WITH [facility] FOR HHRN/ PT (RN CAN DO RT TEACHING AT HOME) PER PT CHOICE; SOC DATE: WITHIN 24-48HRS POST D/C; HOME O2 ALSO TO BE DELIVERED TO ROOM; NCM WILL FOLLOW.  03/23/12  1607 Brode Sculley SIMMONS RN, BSN [phone] PER RN, PT HAS LIFEVEST IN ROOM; REFERRAL PLACED TO [facility] WITH [facility] FOR HOME O2- 2L [place] CONTINUOUS TO BE DELIVERED TO ROOM. NCM WILL FOLLOW.  03/22/12  1109  Aleatha Taite SIMMONS RN, BSN [phone] CXR c/w CHF and BNP increasing. IV lasix added. Ambulating with PT. Agree with low dose cardio selective BB, continue to diurese, follow BNP/CXR. Agree with LifeVest; CURRENTLY ON 4L O2 SATS 93.    03/18/12 1100 Henrietta Mayo RN MSN  CCM Met with pt, dtr, and son-in-law @ bedside.  Per dtr, pt was very active PTA, owns his own business.  Granddaughter and her husband are now living with pt, both work during the day.  Discussed d/c options, pt and family hopeful that pt will be able to d/c with home health - had home health services previously through [facility].  Daughter stated that she is in the process of getting pt established with [facility] for primary care.

## 2012-03-17 NOTE — Consult Note (Addendum)
Reason for Consult: Intermittent ST elevation on EKG and positive Troponin Referring Physician: Dr. [name] is an 71 y.o. male.  HPI: [name] [name] is a 71 yo man with PMH of HTN, dyslipidemia, prior CVA, continued tobacco use, known peripheral vascular disease s/p 2/12 axillobifemoral bypass who was admitted earlier today with presumed COPD exacerbation after recent diagnosis with symptoms of shortness of breath, hypoxia and hypoxemia. He has been hemodynamically stable with initial troponins negative but positive troponin at 21:xx and 23:50 EKG with inferior ST elevations. On my notification, repeat EKG with resolution of EKG. I examined [name] [name] who was intubated but could partially participate on exam with nodding (appropriately), he denied chest pain now or recently or any discomfort. I discussed the case with the STEMI attending on call and we decided urgent catheterization was warranted. I consented [name] [name] daughter and confirmed temporary reversal of partial DNR for the procedure and until stabilization. [name] [name] has no known upcoming surgeries and has previously tolerated plavix well.   Past Medical History  Diagnosis Date  . Hypertension   . Hernia   . Stroke 2004    minor  . Hyperlipidemia   . Peripheral vascular disease   . Shoulder fracture, left   . COPD (chronic obstructive pulmonary disease)     Past Surgical History  Procedure Date  . Bypass graft 07/2010    axillobifemoral BPG  . Spine surgery 11/23/06    microdiscectomy L5-S1  . Hemorrhoid surgery   . Tonsillectomy   . Hernia repair 02/09/11    [facility]    Family History  Problem Relation Age of Onset  . Alzheimer's disease Mother   . Cancer Father     prostate  . Cancer Brother     prostate  . Stroke Brother     Social History:  reports that he has been smoking Cigarettes.  He has a 50 pack-year smoking history. He has never used smokeless tobacco. He reports that he drinks about 4.2 ounces of  alcohol per week. He reports that he does not use illicit drugs.  Allergies:  Allergies  Allergen Reactions  . Ceclor (Cefaclor) Other (See Comments)    Reaction unknown    Medications:  I have reviewed the patient's current medications. Prior to Admission:  Prescriptions prior to admission  Medication Sig Dispense Refill  . amLODipine (NORVASC) 10 MG tablet Take 10 mg by mouth daily.        . clopidogrel (PLAVIX) 75 MG tablet Take 75 mg by mouth daily.      Marland Kitchen gabapentin (NEURONTIN) 600 MG tablet Take 600 mg by mouth 2 (two) times daily.        . hydrALAZINE (APRESOLINE) 25 MG tablet Take 25 mg by mouth 3 (three) times daily.        Marland Kitchen losartan (COZAAR) 50 MG tablet Take 50 mg by mouth 2 (two) times daily.        . nicotine (NICODERM CQ - DOSED IN MG/24 HOURS) 21 mg/24hr patch Place 1 patch onto the skin daily.      Marland Kitchen oxyCODONE-acetaminophen (PERCOCET) 10-650 MG per tablet Take 1 tablet by mouth every 4 (four) hours as needed. For pain      . potassium phosphate, monobasic, (K-PHOS ORIGINAL) 500 MG tablet Take 500 mg by mouth 2 (two) times daily.        . pravastatin (PRAVACHOL) 40 MG tablet Take 40 mg by mouth daily.        Marland Kitchen  risedronate (ACTONEL) 35 MG tablet Take 35 mg by mouth every 7 (seven) days. Take on Wednesdays.  Take with water on empty stomach, nothing by mouth or lie down for next 30 minutes.      . traZODone (DESYREL) 150 MG tablet Take 150 mg by mouth at bedtime as needed. For sleep       Scheduled:   . albuterol  10 mg Nebulization Once  . albuterol  10 mg Nebulization Once  . antiseptic oral rinse  15 mL Mouth Rinse QID  . aspirin  324 mg Oral NOW   Or  . aspirin  300 mg Rectal NOW  . azithromycin (ZITHROMAX) 500 MG IVPB  500 mg Intravenous Once  . bivalirudin      . cefTRIAXone (ROCEPHIN)  IV  1 g Intravenous Q24H  . chlorhexidine  15 mL Mouth Rinse BID  . etomidate      . fentaNYL  200 mcg Intravenous Once  . furosemide  40 mg Intravenous Once  . heparin   3,000 Units Intravenous Once  . influenza  inactive virus vaccine  0.5 mL Intramuscular Tomorrow-1000  . ipratropium  0.5 mg Nebulization Once  . ipratropium  0.5 mg Nebulization Once  . lidocaine (cardiac) 100 mg/71ml      . magnesium sulfate 1 - 4 g bolus IVPB  2 g Intravenous Once  . methylPREDNISolone sodium succinate      . midazolam      . midazolam  4 mg Intravenous Once  . pantoprazole (PROTONIX) IV  40 mg Intravenous QHS  . pneumococcal 23 valent vaccine  0.5 mL Intramuscular Tomorrow-1000  . rocuronium      . succinylcholine      . DISCONTD: heparin  5,000 Units Subcutaneous Q8H    Review of Systems  Unable to perform ROS: intubated   Blood pressure 136/54, pulse 61, temperature 99.2 F (37.3 C), temperature source Core (Comment), resp. rate 16, height 5\' 6"  (1.676 m), weight 62 kg (136 lb 11 oz), SpO2 99.00%. Physical Exam  Nursing note and vitals reviewed. Constitutional: He appears well-developed and well-nourished. No distress.  HENT:       Intubated; mild secretions.   Eyes: Conjunctivae normal and EOM are normal. Pupils are equal, round, and reactive to light. No scleral icterus.  Neck: Normal range of motion. Neck supple. No tracheal deviation present.  Cardiovascular: Normal rate, regular rhythm, normal heart sounds and intact distal pulses.  Exam reveals no gallop and no friction rub.   No murmur heard. Respiratory: Effort normal. No respiratory distress. He has wheezes.       Scattered occasional wheezes with good air movement  GI: Soft. Bowel sounds are normal. He exhibits no distension. There is no tenderness.       Healed midline scar  Musculoskeletal: Normal range of motion. He exhibits no edema and no tenderness.       Warm extremities. Right/left radial arteries 2+. Right femoral 1+. Left femoral minimal.   Neurological: He is alert. No cranial nerve deficit.       Appears to follow commands with nodding appropriately but intubated and mildly sedated    Skin: Skin is warm and dry. No rash noted. He is not diaphoretic. No erythema.   23:50 EKG reviewed; inferior ST elevation with V1-V2 anterior reciprocal changes. Repeat 00:30 EKGs that look like 22:00 EKG with low voltage in limb limbs and V1-V2 qs (septal infarct)  Labs reviewed; K 4.0, Cr 0.9, proBNP 2876, Troponin 0.63 ABG  7.55/37/66 H/h 15.2/43.9, plt 330 Problem List Transient ST elevation on EKG NSTEMI/Elevated Troponin COPD exacerbation with intubation Elevated BNP Leukocytosis Prior Axillofemoral bypass Peripheral vascular Disease  Assessment/Plan: 71 yo man with known peripheral vascular disease s/p axillofemoral bypass (left), recent diagnosis of COPD admitted and subsequently intubated for COPD/shortness of breath with elevated BNP who has had ST elevation on EKG and elevated troponin. Given significant risk factors of PVD, tobacco use, age and prior CVA prefer urgent catheterization. Aspirin 300 mg given earlier today, heparin bolus and gtt ordered per pharmacy. Defer P2Y12 to findings in cath lab. Restart home pravastatin, obtain repeat lipid panel and update hba1c. If his COPD will tolerate low dose beta blocker and heart rate is not too low (<60) favor starting low dose metoprolol in setting of NSTEMI.  - heparin bolus + gtt initiated - LHC - resume previous statin - update TSH, hba1c, lipid profile - low dose beta blocker - 6.25 mg q6-12h if his COPD will tolerate - if bp tolerates and renal function ok in the morning, then start captopril 6.25 mg q8h  --> patient already on ARB so no captopril [name] [name] 03/17/2012, 2:14 AM   Brief Addendum: See Interventional Attending Note for full details of LHC. Challenging anatomy and difficult RCA lesions that were not revascularized. Please keep patient sedated and comfortable. Continue angiomax at current dose until the bag runs out, then continue heparin gtt for medical management of NSTEMI. Continue daily Plavix 75 mg, asa 81  mg daily.

## 2012-03-17 NOTE — Progress Notes (Signed)
EKG taken due to monitor changes. EKG faxed to [facility]. Cardiology consulted.

## 2012-03-17 NOTE — Progress Notes (Signed)
ANTICOAGULATION CONSULT NOTE - Initial Consult  Pharmacy Consult for UFH Indication: NSTEMI  Allergies  Allergen Reactions  . Ceclor (Cefaclor) Other (See Comments)    Reaction unknown    Patient Measurements: Height: 5\' 6"  (167.6 cm) Weight: 136 lb 11 oz (62 kg) IBW/kg (Calculated) : 63.8  Heparin Dosing Weight: 62kg  Vital Signs: Temp: 99.2 F (37.3 C) (09/26 0100) Temp src: Core (Comment) (09/26 0000) BP: 136/54 mmHg (09/26 0100) Pulse Rate: 61  (09/26 0100)  Labs:  Basename 03/16/12 2105 03/16/12 1429 03/16/12 0939 03/16/12 0937  HGB -- -- -- 15.2  HCT -- -- -- 43.9  PLT -- -- -- 330  APTT -- -- -- --  LABPROT -- -- -- --  INR -- -- -- --  HEPARINUNFRC -- -- -- --  CREATININE -- -- -- 0.93  CKTOTAL -- -- -- --  CKMB -- -- -- --  TROPONINI 0.63* <0.30 <0.30 --    Estimated Creatinine Clearance: 64.8 ml/min (by C-G formula based on Cr of 0.93).   Medical History: Past Medical History  Diagnosis Date  . Hypertension   . Hernia   . Stroke 2004    minor  . Hyperlipidemia   . Peripheral vascular disease   . Shoulder fracture, left   . COPD (chronic obstructive pulmonary disease)     Medications:  Prescriptions prior to admission  Medication Sig Dispense Refill  . amLODipine (NORVASC) 10 MG tablet Take 10 mg by mouth daily.        . clopidogrel (PLAVIX) 75 MG tablet Take 75 mg by mouth daily.      Marland Kitchen gabapentin (NEURONTIN) 600 MG tablet Take 600 mg by mouth 2 (two) times daily.        . hydrALAZINE (APRESOLINE) 25 MG tablet Take 25 mg by mouth 3 (three) times daily.        Marland Kitchen losartan (COZAAR) 50 MG tablet Take 50 mg by mouth 2 (two) times daily.        . nicotine (NICODERM CQ - DOSED IN MG/24 HOURS) 21 mg/24hr patch Place 1 patch onto the skin daily.      Marland Kitchen oxyCODONE-acetaminophen (PERCOCET) 10-650 MG per tablet Take 1 tablet by mouth every 4 (four) hours as needed. For pain      . potassium phosphate, monobasic, (K-PHOS ORIGINAL) 500 MG tablet Take 500  mg by mouth 2 (two) times daily.        . pravastatin (PRAVACHOL) 40 MG tablet Take 40 mg by mouth daily.        . risedronate (ACTONEL) 35 MG tablet Take 35 mg by mouth every 7 (seven) days. Take on Wednesdays.  Take with water on empty stomach, nothing by mouth or lie down for next 30 minutes.      . traZODone (DESYREL) 150 MG tablet Take 150 mg by mouth at bedtime as needed. For sleep        Assessment: 71 y/o male patient admitted with respiratory distress, now with positive troponin, EKG changes , ST elevation requiring emergent cath.   Goal of Therapy:  Heparin level 0.3-0.7 units/ml Monitor platelets by anticoagulation protocol: Yes   Plan:  Heparin 3000 unit IV bolus followed by infusion at 850 units/hr. F/u after cath.  Verlene Mayer, PharmD, BCPS Pager [phone] 03/17/2012,1:38 AM

## 2012-03-17 NOTE — Progress Notes (Signed)
eLink Physician-Brief Progress Note Patient Name: [name] [name] DOB: 21-May-1941 MRN: [id number]  Date of Service  03/17/2012   HPI/Events of Note  Code status had with Dr Molli Knock and family  eICU Interventions  i wrote limited code, no cpr, all else ok, d/w RN   Intervention Category Major Interventions: End of life / care limitation discussion  Nelda Bucks. 03/17/2012, 5:27 PM

## 2012-03-17 NOTE — Progress Notes (Signed)
Echocardiogram 2D Echocardiogram has been performed.  [name] [name] 03/17/2012, 11:54 AM

## 2012-03-17 NOTE — Progress Notes (Signed)
. Subjective:  Intubated, sedated but alert and responsive. Denies CP  Objective:  Temp:  [97 F (36.1 C)-99.6 F (37.6 C)] 98.4 F (36.9 C) (09/26 0700) Pulse Rate:  [41-100] 77  (09/26 0736) Resp:  [12-32] 14  (09/26 0736) BP: (93-166)/(54-106) 101/62 mmHg (09/26 0736) SpO2:  [84 %-100 %] 96 % (09/26 0736) Arterial Line BP: (121-185)/(52-111) 142/59 mmHg (09/26 0200) FiO2 (%):  [40 %] 40 % (09/26 0736) Weight:  [62 kg (136 lb 11 oz)] 62 kg (136 lb 11 oz) (09/25 1518) Weight change:   Intake/Output from previous day: 09/25 0701 - 09/26 0700 In: 419.1 [I.V.:409.1; IV Piggyback:10] Out: 490 [Urine:490]  Intake/Output from this shift: Total I/O In: -  Out: 60 [Urine:60]  Physical Exam: General appearance: alert and cooperative Neck: no adenopathy, no carotid bruit, no JVD, supple, symmetrical, trachea midline and thyroid not enlarged, symmetric, no tenderness/mass/nodules Lungs: rhonchi bilaterally Heart: irregularly irregular rhythm Abdomen: soft, non-tender; bowel sounds normal; no masses,  no organomegaly Extremities: No edema  Lab Results: Results for orders placed during the hospital encounter of 03/16/12 (from the past 48 hour(s))  CBC WITH DIFFERENTIAL     Status: Abnormal   Collection Time   03/16/12  9:37 AM      Component Value Range Comment   WBC 20.4 (*) 4.0 - 10.5 K/uL    RBC 4.82  4.22 - 5.81 MIL/uL    Hemoglobin 15.2  13.0 - 17.0 g/dL    HCT 40.9  81.1 - 91.4 %    MCV 91.1  78.0 - 100.0 fL    MCH 31.5  26.0 - 34.0 pg    MCHC 34.6  30.0 - 36.0 g/dL    RDW 78.2  95.6 - 21.3 %    Platelets 330  150 - 400 K/uL    Neutrophils Relative 85 (*) 43 - 77 %    Neutro Abs 17.2 (*) 1.7 - 7.7 K/uL    Lymphocytes Relative 4 (*) 12 - 46 %    Lymphs Abs 0.9  0.7 - 4.0 K/uL    Monocytes Relative 11  3 - 12 %    Monocytes Absolute 2.2 (*) 0.1 - 1.0 K/uL    Eosinophils Relative 0  0 - 5 %    Eosinophils Absolute 0.0  0.0 - 0.7 K/uL    Basophils Relative 0  0 - 1 %     Basophils Absolute 0.0  0.0 - 0.1 K/uL   BASIC METABOLIC PANEL     Status: Abnormal   Collection Time   03/16/12  9:37 AM      Component Value Range Comment   Sodium 127 (*) 135 - 145 mEq/L    Potassium 4.0  3.5 - 5.1 mEq/L    Chloride 82 (*) 96 - 112 mEq/L    CO2 34 (*) 19 - 32 mEq/L    Glucose, Bld 131 (*) 70 - 99 mg/dL    BUN 14  6 - 23 mg/dL    Creatinine, Ser 0.86  0.50 - 1.35 mg/dL    Calcium 9.6  8.4 - 57.8 mg/dL    GFR calc non Af Amer 83 (*) >90 mL/min    GFR calc Af Amer >90  >90 mL/min   TROPONIN I     Status: Normal   Collection Time   03/16/12  9:39 AM      Component Value Range Comment   Troponin I <0.30  <0.30 ng/mL   PRO B NATRIURETIC PEPTIDE  Status: Abnormal   Collection Time   03/16/12  9:39 AM      Component Value Range Comment   Pro B Natriuretic peptide (BNP) 2876.0 (*) 0 - 125 pg/mL   URINALYSIS, ROUTINE W REFLEX MICROSCOPIC     Status: Abnormal   Collection Time   03/16/12  1:47 PM      Component Value Range Comment   Color, Urine YELLOW  YELLOW    APPearance CLEAR  CLEAR    Specific Gravity, Urine 1.015  1.005 - 1.030    pH 6.0  5.0 - 8.0    Glucose, UA NEGATIVE  NEGATIVE mg/dL    Hgb urine dipstick TRACE (*) NEGATIVE    Bilirubin Urine NEGATIVE  NEGATIVE    Ketones, ur NEGATIVE  NEGATIVE mg/dL    Protein, ur 454 (*) NEGATIVE mg/dL    Urobilinogen, UA 1.0  0.0 - 1.0 mg/dL    Nitrite NEGATIVE  NEGATIVE    Leukocytes, UA NEGATIVE  NEGATIVE   STREP PNEUMONIAE URINARY ANTIGEN     Status: Normal   Collection Time   03/16/12  1:47 PM      Component Value Range Comment   Strep Pneumo Urinary Antigen NEGATIVE  NEGATIVE   URINE MICROSCOPIC-ADD ON     Status: Abnormal   Collection Time   03/16/12  1:47 PM      Component Value Range Comment   RBC / HPF 3-6  <3 RBC/hpf    Casts HYALINE CASTS (*) NEGATIVE GRANULAR CAST   Urine-Other MUCOUS PRESENT   AMORPHOUS URATES/PHOSPHATES  TROPONIN I     Status: Normal   Collection Time   03/16/12  2:29 PM       Component Value Range Comment   Troponin I <0.30  <0.30 ng/mL   GLUCOSE, CAPILLARY     Status: Abnormal   Collection Time   03/16/12  3:31 PM      Component Value Range Comment   Glucose-Capillary 145 (*) 70 - 99 mg/dL    Comment 1 Notify RN     MRSA PCR SCREENING     Status: Normal   Collection Time   03/16/12  6:00 PM      Component Value Range Comment   MRSA by PCR NEGATIVE  NEGATIVE   HEPATIC FUNCTION PANEL     Status: Abnormal   Collection Time   03/16/12  7:06 PM      Component Value Range Comment   Total Protein 6.5  6.0 - 8.3 g/dL    Albumin 3.0 (*) 3.5 - 5.2 g/dL    AST 18  0 - 37 U/L    ALT 10  0 - 53 U/L    Alkaline Phosphatase 65  39 - 117 U/L    Total Bilirubin 0.3  0.3 - 1.2 mg/dL    Bilirubin, Direct <0.9  0.0 - 0.3 mg/dL    Indirect Bilirubin NOT CALCULATED  0.3 - 0.9 mg/dL   GLUCOSE, CAPILLARY     Status: Abnormal   Collection Time   03/16/12  7:56 PM      Component Value Range Comment   Glucose-Capillary 132 (*) 70 - 99 mg/dL   TROPONIN I     Status: Abnormal   Collection Time   03/16/12  9:05 PM      Component Value Range Comment   Troponin I 0.63 (*) <0.30 ng/mL   GLUCOSE, CAPILLARY     Status: Abnormal   Collection Time   03/16/12 11:11 PM  Component Value Range Comment   Glucose-Capillary 124 (*) 70 - 99 mg/dL    Comment 1 Documented in Chart      Comment 2 Notify RN     POCT I-STAT 3, BLOOD GAS (G3+)     Status: Abnormal   Collection Time   03/16/12 11:24 PM      Component Value Range Comment   pH, Arterial 7.546 (*) 7.350 - 7.450    pCO2 arterial 36.8  35.0 - 45.0 mmHg    pO2, Arterial 66.0 (*) 80.0 - 100.0 mmHg    Bicarbonate 31.9 (*) 20.0 - 24.0 mEq/L    TCO2 33  0 - 100 mmol/L    O2 Saturation 95.0      Acid-Base Excess 9.0 (*) 0.0 - 2.0 mmol/L    Patient temperature 98.7 F      Collection site RADIAL, ALLEN'S TEST ACCEPTABLE      Drawn by RT      Sample type ARTERIAL     TROPONIN I     Status: Abnormal   Collection Time   03/17/12  12:40 AM      Component Value Range Comment   Troponin I 0.73 (*) <0.30 ng/mL   CK TOTAL AND CKMB     Status: Abnormal   Collection Time   03/17/12 12:40 AM      Component Value Range Comment   Total CK 149  7 - 232 U/L    CK, MB 5.8 (*) 0.3 - 4.0 ng/mL    Relative Index 3.9 (*) 0.0 - 2.5   BASIC METABOLIC PANEL     Status: Abnormal   Collection Time   03/17/12 12:40 AM      Component Value Range Comment   Sodium 127 (*) 135 - 145 mEq/L    Potassium 4.1  3.5 - 5.1 mEq/L    Chloride 87 (*) 96 - 112 mEq/L    CO2 29  19 - 32 mEq/L    Glucose, Bld 119 (*) 70 - 99 mg/dL    BUN 17  6 - 23 mg/dL    Creatinine, Ser 4.54  0.50 - 1.35 mg/dL    Calcium 8.8  8.4 - 09.8 mg/dL    GFR calc non Af Amer 83 (*) >90 mL/min    GFR calc Af Amer >90  >90 mL/min   CBC     Status: Abnormal   Collection Time   03/17/12  5:30 AM      Component Value Range Comment   WBC 21.4 (*) 4.0 - 10.5 K/uL    RBC 4.12 (*) 4.22 - 5.81 MIL/uL    Hemoglobin 12.6 (*) 13.0 - 17.0 g/dL DELTA CHECK NOTED   HCT 37.0 (*) 39.0 - 52.0 %    MCV 89.8  78.0 - 100.0 fL    MCH 30.6  26.0 - 34.0 pg    MCHC 34.1  30.0 - 36.0 g/dL    RDW 11.9  14.7 - 82.9 %    Platelets 294  150 - 400 K/uL   BASIC METABOLIC PANEL     Status: Abnormal   Collection Time   03/17/12  5:30 AM      Component Value Range Comment   Sodium 126 (*) 135 - 145 mEq/L    Potassium 4.0  3.5 - 5.1 mEq/L    Chloride 86 (*) 96 - 112 mEq/L    CO2 31  19 - 32 mEq/L    Glucose, Bld 133 (*) 70 - 99 mg/dL  BUN 18  6 - 23 mg/dL    Creatinine, Ser 1.61  0.50 - 1.35 mg/dL    Calcium 8.6  8.4 - 09.6 mg/dL    GFR calc non Af Amer 83 (*) >90 mL/min    GFR calc Af Amer >90  >90 mL/min   LIPID PANEL     Status: Normal   Collection Time   03/17/12  5:30 AM      Component Value Range Comment   Cholesterol 123  0 - 200 mg/dL    Triglycerides 85  <045 mg/dL    HDL 48  >40 mg/dL    Total CHOL/HDL Ratio 2.6      VLDL 17  0 - 40 mg/dL    LDL Cholesterol 58  0 - 99 mg/dL    MAGNESIUM     Status: Normal   Collection Time   03/17/12  5:30 AM      Component Value Range Comment   Magnesium 2.0  1.5 - 2.5 mg/dL   PHOSPHORUS     Status: Normal   Collection Time   03/17/12  5:30 AM      Component Value Range Comment   Phosphorus 3.9  2.3 - 4.6 mg/dL   POCT I-STAT 3, BLOOD GAS (G3+)     Status: Abnormal   Collection Time   03/17/12  5:37 AM      Component Value Range Comment   pH, Arterial 7.439  7.350 - 7.450    pCO2 arterial 42.7  35.0 - 45.0 mmHg    pO2, Arterial 59.0 (*) 80.0 - 100.0 mmHg    Bicarbonate 29.0 (*) 20.0 - 24.0 mEq/L    TCO2 30  0 - 100 mmol/L    O2 Saturation 91.0      Acid-Base Excess 4.0 (*) 0.0 - 2.0 mmol/L    Patient temperature 36.8 C      Collection site RADIAL, ALLEN'S TEST ACCEPTABLE      Drawn by RT      Sample type ARTERIAL     GLUCOSE, CAPILLARY     Status: Abnormal   Collection Time   03/17/12  7:38 AM      Component Value Range Comment   Glucose-Capillary 139 (*) 70 - 99 mg/dL     Imaging: Imaging results have been reviewed  Assessment/Plan:   1. Active Problems: 2.  Acute respiratory failure 3.  Pulmonary edema 4.  Altered mental status 5.  Hyponatremia 6.  Acidosis 7.  Hypoxemia 8.   Time Spent Directly with Patient:  30 minutes  Length of Stay:  LOS: 1 day   Results of this PM noted. Pt well known to me. H/O COPD with ongoing tobacco, PVOD s/p ax-fem/ fem-fem bypass grafting by Dr. Edilia Bo. Admitted with COPD flair and was noted to have STEMI. Pt was taken to cath lab by Dr. Eldridge Dace who performed a very difficult but ultimately unsuccessful intervention of a tortuous calcified dominant RCA which is now occluded. The decision was to abort procedure and treat medically. Currently he has Inferior ST elevation with reciprocal anterior depression. He is intubated with a SBP about 100 in AFIB with CVR. He is on Angiomax. Will continue to follow medically, cycle enzymes, 2D for LV fxn. Vent per PCCM. Rseume PO meds  when extubated.  Runell Gess 03/17/2012, 8:18 AM

## 2012-03-17 NOTE — Progress Notes (Signed)
INITIAL ADULT NUTRITION ASSESSMENT Date: 03/17/2012   Time: 11:50 AM Reason for Assessment: Vent   INTERVENTION: 1. Recommend: If pt to remain intubated, Initiate Osmolite 1.2 at 15 ml/hr and advance by 10 ml q 4 hours to a goal rate of 45 ml/hr. Provide 30 ml Pro-stat TID via tube. This EN regimen will provide 1596 kcal, 105 gm protein, and 886 ml free water.  2. If no Iv fluids, will require additional free water to meet hydration needs.  3. RD will continue to follow    DOCUMENTATION CODES Per approved criteria  -Not Applicable    ASSESSMENT: Male 71 y.o.  Dx: VDRF, STEMI   Hx:  Past Medical History  Diagnosis Date  . Hypertension   . Hernia   . Stroke 2004    minor  . Hyperlipidemia   . Peripheral vascular disease   . Shoulder fracture, left   . COPD (chronic obstructive pulmonary disease)    Past Surgical History  Procedure Date  . Bypass graft 07/2010    axillobifemoral BPG  . Spine surgery 11/23/06    microdiscectomy L5-S1  . Hemorrhoid surgery   . Tonsillectomy   . Hernia repair 02/09/11    Ventral    Related Meds:     . albuterol-ipratropium  4 puff Inhalation Q6H  . amiodarone  150 mg Intravenous Once  . amLODipine  10 mg Oral Daily  . antiseptic oral rinse  15 mL Mouth Rinse QID  . aspirin  81 mg Oral Daily  . azithromycin (ZITHROMAX) 500 MG IVPB  500 mg Intravenous Once  . bivalirudin      . bivalirudin      . bivalirudin (ANGIOMAX) infusion 5 mg/mL (Cath Lab,ACS,PCI indication)  0.25 mg/kg/hr Intravenous Once  . cefTRIAXone (ROCEPHIN)  IV  1 g Intravenous Q24H  . chlorhexidine  15 mL Mouth Rinse BID  . clopidogrel  75 mg Oral Q breakfast  . etomidate      . fentaNYL      . fentaNYL      . fentaNYL      . fentaNYL  200 mcg Intravenous Once  . gabapentin  600 mg Oral BID  . heparin      . heparin  3,000 Units Intravenous Once  . hydrALAZINE  25 mg Oral TID  . influenza  inactive virus vaccine  0.5 mL Intramuscular Tomorrow-1000  . lidocaine  (cardiac) 100 mg/72ml      . lidocaine      . losartan  50 mg Oral BID  . metoprolol tartrate  12.5 mg Oral BID  . midazolam      . midazolam      . midazolam      . midazolam      . midazolam      . midazolam      . nicotine  7 mg Transdermal Q24H  . nitroGLYCERIN      . pantoprazole sodium  40 mg Per Tube Q24H  . pneumococcal 23 valent vaccine  0.5 mL Intramuscular Tomorrow-1000  . potassium phosphate (monobasic)  500 mg Oral BID  . rocuronium      . simvastatin  40 mg Oral q1800  . succinylcholine      . verapamil      . DISCONTD: aspirin  324 mg Oral NOW  . DISCONTD: aspirin  300 mg Rectal NOW  . DISCONTD: clopidogrel  75 mg Oral Q breakfast  . DISCONTD: furosemide  40 mg Intravenous Once  . DISCONTD:  heparin  5,000 Units Subcutaneous Q8H  . DISCONTD: midazolam  4 mg Intravenous Once  . DISCONTD: nicotine  21 mg Transdermal Q24H  . DISCONTD: pantoprazole (PROTONIX) IV  40 mg Intravenous QHS     Ht: 5\' 6"  (167.6 cm)  Wt: 136 lb 11 oz (62 kg)  Ideal Wt: 64.5 kg  % Ideal Wt: 96%  Usual Wt:  Wt Readings from Last 10 Encounters:  03/16/12 136 lb 11 oz (62 kg)  03/16/12 136 lb 11 oz (62 kg)  03/15/12 138 lb (62.596 kg)  10/13/11 137 lb (62.143 kg)  08/28/11 141 lb 9.6 oz (64.229 kg)  08/23/11 136 lb 6.4 oz (61.871 kg)  07/23/11 140 lb 12.8 oz (63.866 kg)  04/22/11 136 lb 9.6 oz (61.961 kg)  04/20/11 136 lb 3.2 oz (61.78 kg)  03/17/11 133 lb (60.328 kg)    % Usual Wt: ~100%  Body mass index is 22.06 kg/(m^2). Pt is WNL per current BMI   Food/Nutrition Related Hx: MST (Malnutrition Screening Tool) not completed on admission  Labs:  CMP     Component Value Date/Time   NA 126* 03/17/2012 0530   K 4.0 03/17/2012 0530   CL 86* 03/17/2012 0530   CO2 31 03/17/2012 0530   GLUCOSE 133* 03/17/2012 0530   BUN 18 03/17/2012 0530   CREATININE 0.94 03/17/2012 0530   CALCIUM 8.6 03/17/2012 0530   PROT 6.5 03/16/2012 1906   ALBUMIN 3.0* 03/16/2012 1906   AST 18 03/16/2012 1906     ALT 10 03/16/2012 1906   ALKPHOS 65 03/16/2012 1906   BILITOT 0.3 03/16/2012 1906   GFRNONAA 83* 03/17/2012 0530   GFRAA >90 03/17/2012 0530      Intake/Output Summary (Last 24 hours) at 03/17/12 1154 Last data filed at 03/17/12 0800  Gross per 24 hour  Intake 489.21 ml  Output    550 ml  Net -60.79 ml     Diet Order: NPO  Supplements/Tube Feeding: none   IVF:    sodium chloride Last Rate: 500 mL (03/16/12 2347)  sodium chloride Last Rate: 1 mL/kg/hr (03/17/12 0800)  amiodarone (NEXTERONE PREMIX) 360 mg/200 mL dextrose Last Rate: 60 mg/hr (03/17/12 1109)  And   amiodarone (NEXTERONE PREMIX) 360 mg/200 mL dextrose   fentaNYL infusion INTRAVENOUS Last Rate: 50 mcg/hr (03/17/12 0800)  DISCONTD: heparin Last Rate: 850 Units/hr (03/17/12 0155)    MV: 8.3 Temp:Temp (24hrs), Avg:98.8 F (37.1 C), Min:98.1 F (36.7 C), Max:99.6 F (37.6 C)  Propofol: none  Estimated Nutritional Needs:   Kcal: 1601 Protein: 90-105 gm  Fluid: 1.6 L   Pt seen in ED for SOB and d/c'd home, then admitted on 9/25 for SOB required intubation for respiratory failure. Pt also with STEMI, cardiac cath on 9/25.  Pt remains on vent at this time, per notes, to start TF. OG tube placement in stomach verified on CXR.  Weight hx indicates some weight loss over the past several months, not significant. Weight has been stable between 133-141 lbs for past year.   NUTRITION DIAGNOSIS: -Inadequate oral intake (NI-2.1).  Status: Ongoing  RELATED TO: inability to eat  AS EVIDENCE BY: mechanical ventilation   MONITORING/EVALUATION(Goals): Goal: EN to meet 90-100% estimated nutrition needs while intubated.   Monitor: EN initiation/tolerance, weight, labs, I/O's  EDUCATION NEEDS: -No education needs identified at this time   Clarene Duke RD, LDN Pager [phone] After Hours pager [phone]  03/17/2012, 11:50 AM

## 2012-03-17 NOTE — Progress Notes (Signed)
Brief Cardiology Note  MD Tresa Endo notified at 12:20 AM for transient episode of ST elevation in inferior leads with borderline reciprocal changes in anterior leads in [name] [name] who was admitted for shortness of breath/COPD and is intubated. STAT repeat EKG performed that looks exactly 22:10 EKG with low voltage in limb limbs and V1-V2 q waves, normal QRS and heart rate. Patient actually awake, alert and relaxed on ventilator and easily communicated with me. [name] [name] could follow brief commands - close/open eyes, squeeze hands. He answered no to any chest pain or discomfort now or recently. He has had no correlative hemodynamic changes with his transient episode of ST changes on the monitor. On exam, vitals reviewed, BP 121/58  Pulse 62  Temp 99.3 F (37.4 C) (Core (Comment))  Resp 12  Ht 5\' 6"  (1.676 m)  Wt 62 kg (136 lb 11 oz)  BMI 22.06 kg/m2  SpO2 100%, heart regular, no murmur/rubs/gallops - heart sounds not distant, minimal wheezing with good air movement with the ventilator. Extremities warm, good pulses in left/right radial and right femoral artery. Right radial arterial line in place. Initial troponins negative and recent troponin 0.6. Differential diagnosis for transient ST elevations - neurological - Subarachnoid hemorrhage, pericarditis, COPD among other other etiologies. Most likely etiology related to significant CAD. Will discuss anatomical challenges with STEMI attending to determine timing of LHC - urgent vs. Early tomorrow/this AM.   Leeann Must, MD  Addendum: Patient discussed with Dr. Eldridge Dace, patient and daughter updated (and consented) for urgent LHC which we have activated the cath lab to perform. Risks and benefits discussed with [name] [name] and patient (who is alert and oriented but cannot communicate 100% fully given intubation).

## 2012-03-17 NOTE — Progress Notes (Signed)
Chaplain Note:  Chaplain responded to Cath Lab team activation / STEMI page received at 0120.  Pt was intubated and was being treated by Cath Lab staff.  There was no family present.  Chaplain checked in with Cath Lab staff who will page if there are spiritual needs.  Chaplain will refer case to chaplain normally assigned to CICU.  03/17/12 0120  Clinical Encounter Type  Visited With Patient;Health care provider  Visit Type Social support (Code STEMI)  Referral From Physician (Code STEMI Page)  Consult/Referral To Chaplain  Spiritual Encounters  Spiritual Needs Other (Comment) (Pt intubated and unable to converse)  Stress Factors  Patient Stress Factors Major life changes;Health changes  Family Stress Factors None identified (No family present)   Verdie Shire, Chaplain [phone]

## 2012-03-17 NOTE — Progress Notes (Signed)
MD spoke with pt family and obtained consent for heart catherization and temporary reversal of partial code for procedure. RN witnessed phone consent. Pt given 3000 unit heparin bolus, groin site prepped, and AED pads applied prior to transport.  Pt transported to Cath Lab with respiratory therapist and MD. Pola Corn notified when leaving unit.

## 2012-03-17 NOTE — Progress Notes (Signed)
Cardiologist, Dr. Tresa Endo, at pt bedside. STAT repeat EKG done. Troponin, CKMB, BMET labs ordered.

## 2012-03-17 NOTE — Progress Notes (Signed)
03/17/2012   Patient transferred from cath lab to 2900. Spoke with charge RN to give report. No report needed per charge RN.   Sherle Poe RN

## 2012-03-17 NOTE — Progress Notes (Signed)
Name: [name] [name] MRN: [id number] DOB: 17-Jun-1941    LOS: 1  Referring Provider:  EDP Reason for Referral:  Respiratory Failure     PULMONARY / CRITICAL CARE MEDICINE  HPI:  70 yo smoker admitted 03/16/2012 with progressive dyspnea x 2 weeks, acute encephalopathy, respiratory failure with VDRF.  Found to have STEMI, and had emergent cath 9/25.  Followed by Dr. Delton Coombes as outpt. PMHx HTN, CVA, PVD, Hyperlipidemia, COPD  PFT 10/13/11>>FEV1 2.55 (92%), FEV1% 60, TLC 7.11 (118%), DLCO 68%, no BD  Events Since Admission: 9/25 VDRF, STEMI, A fib 9/25 Cardiac cath>>mild/moderate left-sided disease without apparent hemodynamically significant lesion.  Subtotally, occluded distal RCA, with DES performed.  Medical management.  Subjective: Remains on vent, sedated.  Cath results from overnight noted.  Vital Signs: Temp:  [97 F (36.1 C)-99.6 F (37.6 C)] 98.2 F (36.8 C) (09/26 0800) Pulse Rate:  [41-100] 71  (09/26 0800) Resp:  [12-32] 19  (09/26 0800) BP: (87-166)/(54-106) 87/59 mmHg (09/26 0800) SpO2:  [84 %-100 %] 94 % (09/26 0800) Arterial Line BP: (121-185)/(52-111) 142/59 mmHg (09/26 0200) FiO2 (%):  [40 %] 40 % (09/26 0800) Weight:  [136 lb 11 oz (62 kg)] 136 lb 11 oz (62 kg) (09/25 1518)  Physical Examination: General - no distress HEENT - ETT, OG tube in place Cardiac - s1,s2 irregular Chest - Rt [place] CVL site clean, no wheeze/rales Abd - soft, non-tender, +bowel sounds Ext - no edema Neuro -sedated  Dg Chest 2 View  03/15/2012  *RADIOLOGY REPORT*  Clinical Data: Cough, shortness of breath.  CHEST - 2 VIEW  Comparison: 08/23/2011  Findings: Cardiomegaly.  Mild diffuse interstitial prominence.  I suspect this represents chronic interstitial lung disease although a component of early interstitial edema cannot be excluded.  No confluent opacities or effusions.  Progressive moderate compression fracture in the mid thoracic spine.  Diffuse osteopenia.  IMPRESSION:  Cardiomegaly.  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   Original Report Authenticated By: Cyndie Chime, M.D.    Portable Chest Xray In Am  03/17/2012  *RADIOLOGY REPORT*  Clinical Data: Endotracheal tube placement.  PORTABLE CHEST - 1 VIEW  Comparison: 03/16/2012.  Findings: Unchanged endotracheal tube, nasogastric tube and right subclavian central line.  Lung volumes are lower with increasing basilar atelectasis.  Cardiopericardial silhouette appears mildly enlarged.  Surgical clips over the left chest.  IMPRESSION: Stable support apparatus.  Lower lung volumes with increasing basilar atelectasis.   Original Report Authenticated By: Andreas Newport, M.D.    Dg Chest Port 1 View  03/16/2012  a*RADIOLOGY REPORT*  Clinical Data: 71 year old male central line placement.  PORTABLE CHEST - 1 VIEW  Comparison: 1328 hours the same day and earlier.  Findings: Portable semi upright AP view 2200 hours. Endotracheal tube tip may have been advanced slightly, projects about 2 cm above the carina.  Enteric tube now in place, course to the abdomen and tip not included.  Right subclavian approach central line has been adjusted and the tip now projects at the level of the SVC.  Better lung volumes.  Decreased bibasilar opacity.  No pneumothorax.  No large effusion or overt edema.  IMPRESSION: 1.  Right subclavian central line tip not the SVC level. 2.  Enteric tube placed, courses to the abdomen with tip not included. 3.  Improved basilar ventilation.   Original Report Authenticated By: Harley Hallmark, M.D.    Dg Chest Port 1 View  03/16/2012  *  RADIOLOGY REPORT*  Clinical Data: Right central line placement  PORTABLE CHEST - 1 VIEW  Comparison: Prior films same day  Findings: Cardiomediastinal silhouette is stable.  Stable endotracheal tube position.  Persistent mild interstitial prominence.  Stable small left pleural effusion with left  basilar atelectasis or infiltrate.  There is a right subclavian central line.  The tip of the central line has a ascending position at the level of the carina suspicious within the azygos vein. Reposition of the central line is recommended.  No diagnostic pneumothorax.  IMPRESSION: Stable endotracheal tube position.  Persistent mild interstitial prominence.  Stable small left pleural effusion with left basilar atelectasis or infiltrate.  There is a right subclavian central line.  The tip of the central line has a ascending position at the level of the carina suspicious within the azygos vein. Reposition of the central line is recommended.  No diagnostic pneumothorax.   Original Report Authenticated By: Natasha Mead, M.D.    Portable Chest Xray  03/16/2012  *RADIOLOGY REPORT*  Clinical Data: Evaluate endotracheal tube placement  PORTABLE CHEST - 1 VIEW  Comparison:  Prior films same date.  Findings: Cardiomediastinal silhouette is stable.  Endotracheal tube in place with tip 4.2 cm above the carina.  Mild interstitial prominence bilaterally again noted without focal infiltrate.  No diagnostic pneumothorax.  Probable small left pleural effusion with left basilar atelectasis.  IMPRESSION: Endotracheal tube in place with tip 4.2 cm above the carina.  Mild interstitial prominence bilaterally again noted without focal infiltrate.  No diagnostic pneumothorax.  Probable small left pleural effusion with left basilar atelectasis.   Original Report Authenticated By: Natasha Mead, M.D.    Dg Chest Portable 1 View  03/16/2012  *RADIOLOGY REPORT*  Clinical Data: Shortness of breath.  Hypertension.  PORTABLE CHEST - 1 VIEW  Comparison: 03/15/2012  Findings: There is cardiomegaly.  Interstitial prominence throughout the lungs again noted, worsened since prior study. Suspect worsening interstitial edema.  Bibasilar atelectasis present.  Suspect small left effusion.  IMPRESSION: Worsening interstitial prominence, likely worsening  interstitial edema.  Bibasilar atelectasis and small left effusion.   Original Report Authenticated By: Cyndie Chime, M.D.    Dg Abd Portable 1v  03/16/2012  *RADIOLOGY REPORT*  Clinical Data: Swelling and hardness of abdomen, question ileus  PORTABLE ABDOMEN - 1 VIEW  Comparison: Portable exam 1624 hours compared 02/20/2011  Findings: Extensive atherosclerotic calcifications aorta and iliac arteries. Increased stool in the right colon and pelvis. Nonobstructive bowel gas pattern. No bowel dilatation or bowel wall thickening. Significant osseous demineralization. Cardiac silhouette appears enlarged. Bibasilar atelectasis versus infiltrate.  IMPRESSION: Increased stool in right colon and pelvis without evidence of bowel obstruction. Bibasilar atelectasis versus infiltrate.   Original Report Authenticated By: Lollie Marrow, M.D.      ASSESSMENT AND PLAN  PULMONARY  Lab 03/17/12 0537 03/16/12 2324  PHART 7.439 7.546*  PCO2ART 42.7 36.8  PO2ART 59.0* 66.0*  HCO3 29.0* 31.9*  O2SAT 91.0 95.0   Ventilator Settings: Vent Mode:  [-] PRVC FiO2 (%):  [40 %] 40 % Set Rate:  [12 bmp-16 bmp] 12 bmp Vt Set:  [600 mL] 600 mL PEEP:  [5 cmH20] 5 cmH20 Plateau Pressure:  [14 cmH20-17 cmH20] 15 cmH20  ETT:  9/25>>>  A:   Acute respiratory failure 2nd to AECOPD, home narcotics/sedatives, and STEMI.  P:   -continue full vent support -f/u CXR -add scheduled BD's; no need for steroids at this time -change nicotine patch to 7 mg daily  CARDIOVASCULAR  Lab 03/17/12 0040 03/16/12 2105 03/16/12 1429 03/16/12 0939  TROPONINI 0.73* 0.63* <0.30 <0.30  LATICACIDVEN -- -- -- --  PROBNP -- -- -- 2876.0*   ECG:  9/25>>> Lines:   9/25 Rt [place] TLC >>> 9/25 R radial a-line>>9/25  A:  STEMI. Hypotension. A fib. Hx of HTN, Hyperlipidemia, PVD.  P:  -check CVP and then decide about further attempts for diuresis -f/u Echo -norvasc, hydralazine, lopressor, losartan per cardiology -continue plavix,  ASA -continue zocor -angiomax per cardiology>>defer to cardiology whether he needs heparin gtt  RENAL  Lab 03/17/12 0530 03/17/12 0040 03/16/12 0937  NA 126* 127* 127*  K 4.0 4.1 --  CL 86* 87* 82*  CO2 31 29 34*  BUN 18 17 14   CREATININE 0.94 0.92 0.93  CALCIUM 8.6 8.8 9.6  MG 2.0 -- --  PHOS 3.9 -- --   Intake/Output      09/25 0701 - 09/26 0700 09/26 0701 - 09/27 0700   I.V. (mL/kg) 409.1 (6.6) 70.1 (1.1)   IV Piggyback 10    Total Intake(mL/kg) 419.1 (6.8) 70.1 (1.1)   Urine (mL/kg/hr) 490 (0.3) 60   Total Output 490 60   Net -70.9 +10.1         Foley:  9/25>>>  A:   Hyponatremia  Hypochloremia  P:   -f/u BMET -monitor urine outpt, electrolytes, renal fx -check cortisol, TSH   GASTROINTESTINAL  Lab 03/16/12 1906  AST 18  ALT 10  ALKPHOS 65  BILITOT 0.3  PROT 6.5  ALBUMIN 3.0*    A:   Nutrition.  P:   -start tube feeds  HEMATOLOGIC  Lab 03/17/12 0530 03/16/12 0937  HGB 12.6* 15.2  HCT 37.0* 43.9  PLT 294 330  INR -- --  APTT -- --   A:  No acute issues  P:  -monitor CBC  INFECTIOUS  Lab 03/17/12 0530 03/16/12 0937  WBC 21.4* 20.4*  PROCALCITON -- --   Cultures: 9/25 BCx2>>> 9/25 UA>>> 9/25 Sputum>>> 9/25 U. Strep>>>negative 9/25 U. Legion>>>  Antibiotics: Rocephin (AECOPD) 9/25>>> Zithromax (AECOPD) 9/25>>9/25  A:   Acute exacerbation of COPD  P:   -abx as above -cultures as above  ENDOCRINE  Lab 03/17/12 0738 03/16/12 2311 03/16/12 1956 03/16/12 1531  GLUCAP 139* 124* 132* 145*   A:   No acute issues  P:   -CBG's Q6 while NPO  NEUROLOGIC  A:   Encephalopathy - in setting of AECOPD Hx of CVA - on plavix P:   -supportive care, should resolve with correction of respiratory issues -hold plavix, consider restart in am    BEST PRACTICE / DISPOSITION Level of Care:  ICU Primary Service:  PCCM Consultants:  [facility] cardiology Code Status:  LCB, no CPR and no cardioversion. Diet:  Tube feeds DVT Px:   Angiomax GI Px:  Protonix Skin Integrity:  Intact Social / Family:  Updated at bedside  Critical care time 40 minutes.  Coralyn Helling, MD [facility] Pulmonary/Critical Care 03/17/2012, 9:24 AM Pager:  [phone] After 3pm call: [phone]

## 2012-03-17 NOTE — Progress Notes (Signed)
Chaplain visited patient after receiving a referral from on call Chaplain. Patient was sleeping during the time of visit. Chaplain silently prayed for patient. Chaplain will continue to visit patient as needed and provide spiritual care at a later time.

## 2012-03-18 ENCOUNTER — Inpatient Hospital Stay (HOSPITAL_COMMUNITY): Payer: Medicare Other

## 2012-03-18 DIAGNOSIS — I472 Ventricular tachycardia: Secondary | ICD-10-CM | POA: Diagnosis present

## 2012-03-18 DIAGNOSIS — I219 Acute myocardial infarction, unspecified: Secondary | ICD-10-CM

## 2012-03-18 DIAGNOSIS — Z8673 Personal history of transient ischemic attack (TIA), and cerebral infarction without residual deficits: Secondary | ICD-10-CM

## 2012-03-18 DIAGNOSIS — J40 Bronchitis, not specified as acute or chronic: Secondary | ICD-10-CM

## 2012-03-18 DIAGNOSIS — I480 Paroxysmal atrial fibrillation: Secondary | ICD-10-CM | POA: Diagnosis not present

## 2012-03-18 DIAGNOSIS — E785 Hyperlipidemia, unspecified: Secondary | ICD-10-CM | POA: Diagnosis present

## 2012-03-18 DIAGNOSIS — I10 Essential (primary) hypertension: Secondary | ICD-10-CM | POA: Diagnosis present

## 2012-03-18 DIAGNOSIS — I739 Peripheral vascular disease, unspecified: Secondary | ICD-10-CM | POA: Diagnosis present

## 2012-03-18 DIAGNOSIS — I213 ST elevation (STEMI) myocardial infarction of unspecified site: Secondary | ICD-10-CM | POA: Diagnosis present

## 2012-03-18 LAB — GLUCOSE, CAPILLARY
Glucose-Capillary: 113 mg/dL — ABNORMAL HIGH (ref 70–99)
Glucose-Capillary: 90 mg/dL (ref 70–99)

## 2012-03-18 LAB — CBC
Hemoglobin: 10.9 g/dL — ABNORMAL LOW (ref 13.0–17.0)
MCH: 30.8 pg (ref 26.0–34.0)
MCV: 90.4 fL (ref 78.0–100.0)
RBC: 3.54 MIL/uL — ABNORMAL LOW (ref 4.22–5.81)
WBC: 19 10*3/uL — ABNORMAL HIGH (ref 4.0–10.5)

## 2012-03-18 LAB — COMPREHENSIVE METABOLIC PANEL
AST: 257 U/L — ABNORMAL HIGH (ref 0–37)
Albumin: 2.6 g/dL — ABNORMAL LOW (ref 3.5–5.2)
BUN: 25 mg/dL — ABNORMAL HIGH (ref 6–23)
Calcium: 8.3 mg/dL — ABNORMAL LOW (ref 8.4–10.5)
Creatinine, Ser: 1.2 mg/dL (ref 0.50–1.35)
GFR calc non Af Amer: 60 mL/min — ABNORMAL LOW (ref >90)

## 2012-03-18 LAB — HEPARIN LEVEL (UNFRACTIONATED): Heparin Unfractionated: 0.13 IU/mL — ABNORMAL LOW (ref 0.30–0.70)

## 2012-03-18 LAB — MAGNESIUM: Magnesium: 2.2 mg/dL (ref 1.5–2.5)

## 2012-03-18 MED ORDER — ZOLPIDEM TARTRATE 5 MG PO TABS
5.0000 mg | ORAL_TABLET | Freq: Once | ORAL | Status: AC
  Administered 2012-03-18: 5 mg via ORAL
  Filled 2012-03-18: qty 1

## 2012-03-18 MED ORDER — ALBUTEROL SULFATE (5 MG/ML) 0.5% IN NEBU
2.5000 mg | INHALATION_SOLUTION | Freq: Four times a day (QID) | RESPIRATORY_TRACT | Status: DC
  Administered 2012-03-18 – 2012-03-20 (×10): 2.5 mg via RESPIRATORY_TRACT
  Filled 2012-03-18 (×10): qty 0.5

## 2012-03-18 MED ORDER — HEPARIN SODIUM (PORCINE) 1000 UNIT/ML IJ SOLN
INTRAMUSCULAR | Status: AC
  Filled 2012-03-18: qty 1

## 2012-03-18 MED ORDER — DEXTROSE 5 % IV SOLN
75.0000 mg | Freq: Once | INTRAVENOUS | Status: AC
  Administered 2012-03-18: 75 mg via INTRAVENOUS
  Filled 2012-03-18: qty 1.5

## 2012-03-18 MED ORDER — LOSARTAN POTASSIUM 25 MG PO TABS
25.0000 mg | ORAL_TABLET | Freq: Every day | ORAL | Status: DC
  Administered 2012-03-18 – 2012-03-21 (×4): 25 mg via ORAL
  Filled 2012-03-18 (×4): qty 1

## 2012-03-18 MED ORDER — METOPROLOL TARTRATE 1 MG/ML IV SOLN
INTRAVENOUS | Status: AC
  Administered 2012-03-18: 2.5 mg
  Filled 2012-03-18: qty 5

## 2012-03-18 MED ORDER — NITROGLYCERIN 0.2 MG/HR TD PT24
0.2000 mg | MEDICATED_PATCH | Freq: Every day | TRANSDERMAL | Status: DC
  Administered 2012-03-18 – 2012-03-22 (×4): 0.2 mg via TRANSDERMAL
  Filled 2012-03-18 (×6): qty 1

## 2012-03-18 MED ORDER — VERAPAMIL HCL 2.5 MG/ML IV SOLN
INTRAVENOUS | Status: AC
  Filled 2012-03-18: qty 2

## 2012-03-18 MED ORDER — MIDAZOLAM HCL 2 MG/2ML IJ SOLN
INTRAMUSCULAR | Status: AC
  Filled 2012-03-18: qty 2

## 2012-03-18 MED ORDER — PANTOPRAZOLE SODIUM 40 MG PO TBEC
40.0000 mg | DELAYED_RELEASE_TABLET | Freq: Every day | ORAL | Status: DC
  Administered 2012-03-18 – 2012-04-06 (×20): 40 mg via ORAL
  Filled 2012-03-18 (×20): qty 1

## 2012-03-18 MED ORDER — IPRATROPIUM BROMIDE 0.02 % IN SOLN
0.5000 mg | Freq: Four times a day (QID) | RESPIRATORY_TRACT | Status: DC
  Administered 2012-03-18 – 2012-03-20 (×10): 0.5 mg via RESPIRATORY_TRACT
  Filled 2012-03-18 (×10): qty 2.5

## 2012-03-18 MED ORDER — FENTANYL CITRATE 0.05 MG/ML IJ SOLN
INTRAMUSCULAR | Status: AC
  Filled 2012-03-18: qty 2

## 2012-03-18 MED ORDER — METOPROLOL TARTRATE 1 MG/ML IV SOLN
2.5000 mg | Freq: Once | INTRAVENOUS | Status: AC
  Administered 2012-03-18: 16:00:00 via INTRAVENOUS

## 2012-03-18 MED ORDER — HEPARIN (PORCINE) IN NACL 100-0.45 UNIT/ML-% IJ SOLN
1200.0000 [IU]/h | INTRAMUSCULAR | Status: DC
  Administered 2012-03-18 – 2012-03-19 (×2): 1200 [IU]/h via INTRAVENOUS
  Filled 2012-03-18 (×5): qty 250

## 2012-03-18 MED ORDER — BIOTENE DRY MOUTH MT LIQD
15.0000 mL | Freq: Two times a day (BID) | OROMUCOSAL | Status: DC
  Administered 2012-03-19 – 2012-04-06 (×36): 15 mL via OROMUCOSAL

## 2012-03-18 MED ORDER — ALBUTEROL SULFATE (5 MG/ML) 0.5% IN NEBU
2.5000 mg | INHALATION_SOLUTION | RESPIRATORY_TRACT | Status: DC | PRN
  Administered 2012-03-21: 2.5 mg via RESPIRATORY_TRACT
  Filled 2012-03-18: qty 0.5

## 2012-03-18 MED ORDER — FENTANYL CITRATE 0.05 MG/ML IJ SOLN
50.0000 ug | INTRAMUSCULAR | Status: DC | PRN
  Administered 2012-03-19 – 2012-04-05 (×10): 50 ug via INTRAVENOUS
  Filled 2012-03-18 (×10): qty 2

## 2012-03-18 MED ORDER — AMLODIPINE BESYLATE 5 MG PO TABS
5.0000 mg | ORAL_TABLET | Freq: Every day | ORAL | Status: DC
  Administered 2012-03-18 – 2012-03-24 (×7): 5 mg via ORAL
  Filled 2012-03-18 (×7): qty 1

## 2012-03-18 MED ORDER — METOPROLOL TARTRATE 12.5 MG HALF TABLET
12.5000 mg | ORAL_TABLET | Freq: Three times a day (TID) | ORAL | Status: DC
  Administered 2012-03-18 – 2012-03-27 (×28): 12.5 mg via ORAL
  Filled 2012-03-18 (×31): qty 1

## 2012-03-18 MED ORDER — ATORVASTATIN CALCIUM 20 MG PO TABS
20.0000 mg | ORAL_TABLET | Freq: Every day | ORAL | Status: DC
  Administered 2012-03-18 – 2012-04-05 (×19): 20 mg via ORAL
  Filled 2012-03-18 (×22): qty 1

## 2012-03-18 MED FILL — Dextrose Inj 5%: INTRAVENOUS | Qty: 50 | Status: AC

## 2012-03-18 NOTE — Progress Notes (Signed)
Subjective:  Intubated, awake and alert, no chest pain  Objective:  Vital Signs in the last 24 hours: Temp:  [98.1 F (36.7 C)-99.1 F (37.3 C)] 98.2 F (36.8 C) (09/27 0500) Pulse Rate:  [50-78] 55  (09/27 0700) Resp:  [12-24] 14  (09/27 0700) BP: (84-108)/(50-69) 99/58 mmHg (09/27 0700) SpO2:  [92 %-98 %] 97 % (09/27 0700) FiO2 (%):  [40 %] 40 % (09/27 0804) Weight:  [62.2 kg (137 lb 2 oz)] 62.2 kg (137 lb 2 oz) (09/27 0500)  Intake/Output from previous day:  Intake/Output Summary (Last 24 hours) at 03/18/12 0823 Last data filed at 03/18/12 0700  Gross per 24 hour  Intake 2274.03 ml  Output   1010 ml  Net 1264.03 ml    Physical Exam: General appearance: alert, cooperative, no distress and intubated Lungs: clear to auscultation bilaterally Heart: regular rate and rhythm   Rate: 80  Rhythm: normal sinus rhythm and , 5 beats of irreg irreg NSWCT earlier and in AF earlier this am.  Lab Results:  Basename 03/18/12 0457 03/17/12 0530  WBC 19.0* 21.4*  HGB 10.9* 12.6*  PLT 255 294    Basename 03/18/12 0457 03/17/12 0530  NA 131* 126*  K 4.3 4.0  CL 92* 86*  CO2 31 31  GLUCOSE 100* 133*  BUN 25* 18  CREATININE 1.20 0.94    Basename 03/17/12 2013 03/17/12 1625  TROPONINI >20.00* >20.00*   Hepatic Function Panel  Basename 03/18/12 0457 03/16/12 1906  PROT 5.6* --  ALBUMIN 2.6* --  AST 257* --  ALT 62* --  ALKPHOS 56 --  BILITOT 0.2* --  BILIDIR -- <0.1  IBILI -- NOT CALCULATED    Basename 03/17/12 0530  CHOL 123   No results found for this basename: INR in the last 72 hours  Imaging: Imaging results have been reviewed  Cardiac Studies:  Assessment/Plan:   Principal Problem:  *STEMI, unsuccessful RCA PCI 03/17/12 Active Problems:  Acute respiratory failure  Pulmonary edema  COPD with respiratory failure, acute  PAF, post MI  NSVT, post MI  Tobacco abuse  PVD, history of Lt Ax-fem and fem-fem BPG in past, Dr Manson Passey follows  HTN  (hypertension)  Dyslipidemia  History of CVA (cerebrovascular accident)  Plan- B/P and rhythm stable this am. He is weaning from vent. He is on Heparin and Amiodarone. He is relatively hypotensive, will back off on his ARB and Norvasc, add low dose nitrate.    Corine Shelter PA-C 03/18/2012, 8:23 AM   Agree with note written by Corine Shelter [facility]  Looks much better this AM. Being weaned from Vent. Less VT on IV Amio. WBC still increased. Not on ATBX. ? If increased WBCs secondary to stress vs infxn. Will defer to PCCM to decide re ATBX empiric Rx. Increased LFTs prob secondary to MI. Will eventually transition to PO amio. 2 D pending.   Runell Gess 03/18/2012 8:49 AM

## 2012-03-18 NOTE — Progress Notes (Signed)
Name: [name] [name] MRN: [id number] DOB: 1940/09/24    LOS: 2  Referring Provider:  EDP Reason for Referral:  Respiratory Failure     PULMONARY / CRITICAL CARE MEDICINE  HPI:  71 yo smoker admitted 03/16/2012 with progressive dyspnea x 2 weeks, acute encephalopathy, respiratory failure with VDRF.  Found to have STEMI, and had emergent cath 9/25.  Followed by Dr. Delton Coombes as outpt. PMHx HTN, CVA, PVD, Hyperlipidemia, COPD  PFT 10/13/11>>FEV1 2.55 (92%), FEV1% 60, TLC 7.11 (118%), DLCO 68%, no BD  Events Since Admission: 9/25 VDRF, STEMI, A fib 9/25 Cardiac cath>>mild/moderate left-sided disease without apparent hemodynamically significant lesion.  Subtotally, occluded distal RCA, with DES performed.  Medical management.  Subjective: Did well with SBT.  Denies chest/abd pain.  Vital Signs: Temp:  [98.1 F (36.7 C)-99.1 F (37.3 C)] 98.2 F (36.8 C) (09/27 0500) Pulse Rate:  [50-78] 55  (09/27 0700) Resp:  [12-24] 14  (09/27 0700) BP: (84-108)/(50-69) 99/58 mmHg (09/27 0700) SpO2:  [92 %-98 %] 97 % (09/27 0700) FiO2 (%):  [40 %] 40 % (09/27 0804) Weight:  [137 lb 2 oz (62.2 kg)] 137 lb 2 oz (62.2 kg) (09/27 0500)  Physical Examination: General - no distress HEENT - ETT, OG tube in place Cardiac - s1,s2 irregular Chest - Rt  CVL site clean, no wheeze/rales Abd - soft, non-tender, +bowel sounds Ext - no edema Neuro -Alert, follows commands, moves all extremities  Dg Chest Port 1 View  03/18/2012  *RADIOLOGY REPORT*  Clinical Data: Assess endotracheal tube.  PORTABLE CHEST - 1 VIEW  Comparison: 03/17/2012.  Findings: Endotracheal tube, nasogastric tube and right subclavian central line remain present.  The central line has been withdrawn and the tip is in the upper to mid superior vena cava, above the level of the carina, previously seen lower.  The pulmonary aeration appears little changed allowing for differences in technique. Cardiopericardial silhouette unchanged.   IMPRESSION:  1.  Unchanged endotracheal tube and nasogastric tube. 2.  Retraction of right subclavian central line to the level of the upper to mid superior vena cava. 3.  No interval change in pulmonary aeration.   Original Report Authenticated By: Andreas Newport, M.D.    Portable Chest Xray In Am  03/17/2012  *RADIOLOGY REPORT*  Clinical Data: Endotracheal tube placement.  PORTABLE CHEST - 1 VIEW  Comparison: 03/16/2012.  Findings: Unchanged endotracheal tube, nasogastric tube and right subclavian central line.  Lung volumes are lower with increasing basilar atelectasis.  Cardiopericardial silhouette appears mildly enlarged.  Surgical clips over the left chest.  IMPRESSION: Stable support apparatus.  Lower lung volumes with increasing basilar atelectasis.   Original Report Authenticated By: Andreas Newport, M.D.    Dg Chest Port 1 View  03/16/2012  a*RADIOLOGY REPORT*  Clinical Data: 71 year old male central line placement.  PORTABLE CHEST - 1 VIEW  Comparison: 1328 hours the same day and earlier.  Findings: Portable semi upright AP view 2200 hours. Endotracheal tube tip may have been advanced slightly, projects about 2 cm above the carina.  Enteric tube now in place, course to the abdomen and tip not included.  Right subclavian approach central line has been adjusted and the tip now projects at the level of the SVC.  Better lung volumes.  Decreased bibasilar opacity.  No pneumothorax.  No large effusion or overt edema.  IMPRESSION: 1.  Right subclavian central line tip not the SVC level. 2.  Enteric tube placed, courses to the abdomen with tip not included. 3.  Improved basilar ventilation.   Original Report Authenticated By: Harley Hallmark, M.D.    Dg Chest Port 1 View  03/16/2012  *RADIOLOGY REPORT*  Clinical Data: Right central line placement  PORTABLE CHEST - 1 VIEW  Comparison: Prior films same day  Findings: Cardiomediastinal silhouette is stable.  Stable endotracheal tube position.  Persistent mild  interstitial prominence.  Stable small left pleural effusion with left basilar atelectasis or infiltrate.  There is a right subclavian central line.  The tip of the central line has a ascending position at the level of the carina suspicious within the azygos vein. Reposition of the central line is recommended.  No diagnostic pneumothorax.  IMPRESSION: Stable endotracheal tube position.  Persistent mild interstitial prominence.  Stable small left pleural effusion with left basilar atelectasis or infiltrate.  There is a right subclavian central line.  The tip of the central line has a ascending position at the level of the carina suspicious within the azygos vein. Reposition of the central line is recommended.  No diagnostic pneumothorax.   Original Report Authenticated By: Natasha Mead, M.D.    Portable Chest Xray  03/16/2012  *RADIOLOGY REPORT*  Clinical Data: Evaluate endotracheal tube placement  PORTABLE CHEST - 1 VIEW  Comparison:  Prior films same date.  Findings: Cardiomediastinal silhouette is stable.  Endotracheal tube in place with tip 4.2 cm above the carina.  Mild interstitial prominence bilaterally again noted without focal infiltrate.  No diagnostic pneumothorax.  Probable small left pleural effusion with left basilar atelectasis.  IMPRESSION: Endotracheal tube in place with tip 4.2 cm above the carina.  Mild interstitial prominence bilaterally again noted without focal infiltrate.  No diagnostic pneumothorax.  Probable small left pleural effusion with left basilar atelectasis.   Original Report Authenticated By: Natasha Mead, M.D.    Dg Chest Portable 1 View  03/16/2012  *RADIOLOGY REPORT*  Clinical Data: Shortness of breath.  Hypertension.  PORTABLE CHEST - 1 VIEW  Comparison: 03/15/2012  Findings: There is cardiomegaly.  Interstitial prominence throughout the lungs again noted, worsened since prior study. Suspect worsening interstitial edema.  Bibasilar atelectasis present.  Suspect small left  effusion.  IMPRESSION: Worsening interstitial prominence, likely worsening interstitial edema.  Bibasilar atelectasis and small left effusion.   Original Report Authenticated By: Cyndie Chime, M.D.    Dg Abd Portable 1v  03/16/2012  *RADIOLOGY REPORT*  Clinical Data: Swelling and hardness of abdomen, question ileus  PORTABLE ABDOMEN - 1 VIEW  Comparison: Portable exam 1624 hours compared 02/20/2011  Findings: Extensive atherosclerotic calcifications aorta and iliac arteries. Increased stool in the right colon and pelvis. Nonobstructive bowel gas pattern. No bowel dilatation or bowel wall thickening. Significant osseous demineralization. Cardiac silhouette appears enlarged. Bibasilar atelectasis versus infiltrate.  IMPRESSION: Increased stool in right colon and pelvis without evidence of bowel obstruction. Bibasilar atelectasis versus infiltrate.   Original Report Authenticated By: Lollie Marrow, M.D.      ASSESSMENT AND PLAN  PULMONARY  Lab 03/17/12 0537 03/16/12 2324 03/16/12 1334 03/16/12 0950  PHART 7.439 7.546* 7.392 7.475*  PCO2ART 42.7 36.8 52.0* 50.6*  PO2ART 59.0* 66.0* 60.0* 106.0*  HCO3 29.0* 31.9* 31.6* 37.4*  O2SAT 91.0 95.0 90.0 98.0   Ventilator Settings: Vent Mode:  [-] CPAP FiO2 (%):  [40 %] 40 % Set Rate:  [12 bmp] 12 bmp Vt Set:  [600 mL] 600 mL PEEP:  [5 cmH20] 5 cmH20 Pressure Support:  [5 cmH20] 5 cmH20 Plateau Pressure:  [14 cmH20-21 cmH20] 14 cmH20  ETT:  9/25>>>9/27  A:   Acute respiratory failure 2nd to AECOPD, home narcotics/sedatives, and STEMI.  P:   -proceed with extubation -f/u CXR intermittently -add scheduled BD's; no need for steroids at this time -continue nicotine patch 7 mg daily  CARDIOVASCULAR  Lab 03/17/12 2013 03/17/12 1625 03/17/12 0818 03/17/12 0040 03/16/12 2105 03/16/12 0939  TROPONINI >20.00* >20.00* >20.00* 0.73* 0.63* --  LATICACIDVEN -- -- -- -- -- --  PROBNP -- -- -- -- -- 2876.0*    Lines:   9/25 Rt [place] TLC >>> 9/25 R  radial a-line>>9/25  A:  STEMI. Hypotension likely from hypovolemia>>improve 9/27. A fib. Hx of HTN, Hyperlipidemia, PVD.  P:  -goal CVP > 8 -f/u Echo -norvasc, hydralazine, lopressor, losartan per cardiology -continue plavix, ASA -continue zocor -amiodarone, heparin gtt's per cardiology  RENAL  Lab 03/18/12 0457 03/17/12 0530 03/17/12 0040 03/16/12 0937  NA 131* 126* 127* 127*  K 4.3 4.0 -- --  CL 92* 86* 87* 82*  CO2 31 31 29  34*  BUN 25* 18 17 14   CREATININE 1.20 0.94 0.92 0.93  CALCIUM 8.3* 8.6 8.8 9.6  MG 2.2 2.0 -- --  PHOS 4.3 3.9 -- --   Intake/Output      09/26 0701 - 09/27 0700 09/27 0701 - 09/28 0700   P.O. 20    I.V. (mL/kg) 1994.1 (32.1)    NG/GT 230    IV Piggyback 100    Total Intake(mL/kg) 2344.1 (37.7)    Urine (mL/kg/hr) 1070 (0.7)    Total Output 1070    Net +1274.1          Foley:  9/25>>>  A:   Hyponatremia >> improved Hypochloremia  P:   -f/u BMET -monitor urine outpt, electrolytes, renal fx  GASTROINTESTINAL  Lab 03/18/12 0457 03/16/12 1906  AST 257* 18  ALT 62* 10  ALKPHOS 56 65  BILITOT 0.2* 0.3  PROT 5.6* 6.5  ALBUMIN 2.6* 3.0*    A:   Nutrition. Increased LFT's likely from hypotension.  P:   -advance diet as tolerated after extubation -f/u LFT's>if persistently elevated then may need to hold statin  HEMATOLOGIC  Lab 03/18/12 0457 03/17/12 0530 03/16/12 0937  HGB 10.9* 12.6* 15.2  HCT 32.0* 37.0* 43.9  PLT 255 294 330  INR -- -- --  APTT -- -- --   A:  Anemia>>likely hemodilutional  P:  -monitor CBC  INFECTIOUS  Lab 03/18/12 0457 03/17/12 0530 03/16/12 0937  WBC 19.0* 21.4* 20.4*  PROCALCITON -- -- --   Cultures: 9/25 BCx2>>> 9/25 Sputum>>> 9/25 U. Strep>>>negative 9/25 U. Legion>>>negative  Antibiotics: Rocephin (AECOPD) 9/25>>> Zithromax (AECOPD) 9/25>>9/25  A:   Acute exacerbation of COPD  P:   -D3/7 rocephin  ENDOCRINE  Lab 03/18/12 0455 03/17/12 2313 03/17/12 1953 03/17/12 1816  03/17/12 1337  GLUCAP 106* 113* 114* 114* 120*   A:   No acute issues  P:   -d/c CBG checks  NEUROLOGIC  A:   Encephalopathy>>resolved Hx of CVA P:   -continue plavix   BEST PRACTICE / DISPOSITION Level of Care:  ICU Primary Service: [facility] cardiology Consultants:  PCCM Code Status:  LCB, no CPR and no cardioversion. Diet: Heart healthy DVT Px: Heparin gtt GI Px:  Protonix Skin Integrity:  Intact Social / Family:  Updated at bedside  Critical care time 35 minutes.  Coralyn Helling, MD [facility] Pulmonary/Critical Care 03/18/2012, 9:31 AM Pager:  [phone] After 3pm call: [phone]

## 2012-03-18 NOTE — Progress Notes (Signed)
Pt extubated to 3L [place] at 0935.  Hr 75, RR 20, B/P 121/66...coarse/diminished BBS, no stridor.  RT will continue to monitor.

## 2012-03-18 NOTE — Progress Notes (Signed)
Pt was oob in the chair when he went into Vtach x 10 sec.  Pt was alert and oriented throughout the event.  BP was 105/62. Corine Shelter, [place] was called and orders were received for [place] 2.5 mg IV x 1.  Before being able to administer the dose, pt went back into Vtach x 15 seconds and complained of numbness of the right hand and arm, diaphoresis and chest pain substernal and radiating to the back.  Dr. Vassie Loll at [facility] was called and he suggested that we defer to cardiology.  Dr. Allyson Sabal was called and new orders for Amioadarone bolus and an increase in PO metoprolol were received.

## 2012-03-18 NOTE — Progress Notes (Addendum)
ANTICOAGULATION CONSULT NOTE - Follow Up Consult  Pharmacy Consult for heparin Indication: ACS/STEMI  Allergies  Allergen Reactions  . Ceclor (Cefaclor) Other (See Comments)    Reaction unknown    Patient Measurements: Height: 5\' 6"  (167.6 cm) Weight: 137 lb 2 oz (62.2 kg) IBW/kg (Calculated) : 63.8  Heparin Dosing Weight: 62.2kg Vital Signs: Temp: 99.5 F (37.5 C) (09/27 1200) Temp src: Core (Comment) (09/27 1200) BP: 98/61 mmHg (09/27 1200) Pulse Rate: 70  (09/27 1200)  Labs:  Basename 03/18/12 0900 03/18/12 0457 03/18/12 0027 03/17/12 2013 03/17/12 1625 03/17/12 0818 03/17/12 0530 03/17/12 0040 03/16/12 0937  HGB -- 10.9* -- -- -- -- 12.6* -- --  HCT -- 32.0* -- -- -- -- 37.0* -- 43.9  PLT -- 255 -- -- -- -- 294 -- 330  APTT -- -- -- -- -- -- -- -- --  LABPROT -- -- -- -- -- -- -- -- --  INR -- -- -- -- -- -- -- -- --  HEPARINUNFRC 0.20* -- 0.13* -- -- -- -- -- --  CREATININE -- 1.20 -- -- -- -- 0.94 0.92 --  CKTOTAL -- -- -- -- -- -- -- 149 --  CKMB -- -- -- -- -- -- -- 5.8* --  TROPONINI -- -- -- >20.00* >20.00* >20.00* -- -- --    Estimated Creatinine Clearance: 50.4 ml/min (by C-G formula based on Cr of 1.2).   Medications:  Scheduled:    . ipratropium  0.5 mg Nebulization Q6H   And  . albuterol  2.5 mg Nebulization Q6H  . amLODipine  5 mg Oral Daily  . antiseptic oral rinse  15 mL Mouth Rinse QID  . aspirin  81 mg Oral Daily  . cefTRIAXone (ROCEPHIN)  IV  1 g Intravenous Q24H  . chlorhexidine  15 mL Mouth Rinse BID  . clopidogrel  75 mg Oral Q breakfast  . fentaNYL      . gabapentin  600 mg Oral BID  . heparin      . losartan  25 mg Oral Daily  . metoprolol tartrate  12.5 mg Oral BID  . midazolam      . midazolam      . nicotine  7 mg Transdermal Q24H  . nitroGLYCERIN  0.2 mg Transdermal Daily  . pantoprazole  40 mg Oral Q1200  . potassium phosphate (monobasic)  500 mg Oral BID  . simvastatin  40 mg Oral q1800  . verapamil      . DISCONTD:  albuterol-ipratropium  4 puff Inhalation Q6H  . DISCONTD: amLODipine  10 mg Oral Daily  . DISCONTD: hydrALAZINE  25 mg Oral TID  . DISCONTD: losartan  50 mg Oral BID  . DISCONTD: pantoprazole sodium  40 mg Per Tube Q24H    Assessment: 71 y.o male presented with STEMI now s/p cath. Received 1 DES however had continued poor flow and was continued on bivalrudin post cath. Xa level sub therapeutic this AM at 0.20, will increase gtt rate.  Goal of Therapy:  Heparin level 0.3-0.7 units/ml Monitor platelets by anticoagulation protocol: Yes   Plan:  Increase heparin drip rate 1200 units/hour Follow up 8 hour HL Monitor for s/sx of bleeding  Bola A. Wandra Feinstein D Clinical Pharmacist Pager:[phone] Phone [phone] 03/18/2012 2:17 PM

## 2012-03-18 NOTE — Progress Notes (Signed)
ANTICOAGULATION CONSULT NOTE - Follow Up Consult  Pharmacy Consult for heparin Indication: ACS/STEMI  Allergies  Allergen Reactions  . Ceclor (Cefaclor) Other (See Comments)    Reaction unknown    Patient Measurements: Height: 5\' 6"  (167.6 cm) Weight: 137 lb 2 oz (62.2 kg) IBW/kg (Calculated) : 63.8  Heparin Dosing Weight: 62.2kg Vital Signs: Temp: 99.1 F (37.3 C) (09/27 1600) Temp src: Oral (09/27 1600) BP: 84/51 mmHg (09/27 2100) Pulse Rate: 70  (09/27 2100)  Labs:  [name] [name] 03/18/12 2308 03/18/12 0900 03/18/12 0457 03/18/12 0027 03/17/12 2013 03/17/12 1625 03/17/12 0818 03/17/12 0530 03/17/12 0040 03/16/12 0937  HGB -- -- 10.9* -- -- -- -- 12.6* -- --  HCT -- -- 32.0* -- -- -- -- 37.0* -- 43.9  PLT -- -- 255 -- -- -- -- 294 -- 330  APTT -- -- -- -- -- -- -- -- -- --  LABPROT -- -- -- -- -- -- -- -- -- --  INR -- -- -- -- -- -- -- -- -- --  HEPARINUNFRC 0.33 0.20* -- 0.13* -- -- -- -- -- --  CREATININE -- -- 1.20 -- -- -- -- 0.94 0.92 --  CKTOTAL -- -- -- -- -- -- -- -- 149 --  CKMB -- -- -- -- -- -- -- -- 5.8* --  TROPONINI -- -- -- -- >20.00* >20.00* >20.00* -- -- --    Estimated Creatinine Clearance: 50.4 ml/min (by C-G formula based on Cr of 1.2).   Medications:  Scheduled:     . ipratropium  0.5 mg Nebulization Q6H   And  . albuterol  2.5 mg Nebulization Q6H  . amiodarone  75 mg Intravenous Once  . amLODipine  5 mg Oral Daily  . antiseptic oral rinse  15 mL Mouth Rinse BID  . aspirin  81 mg Oral Daily  . atorvastatin  20 mg Oral q1800  . cefTRIAXone (ROCEPHIN)  IV  1 g Intravenous Q24H  . clopidogrel  75 mg Oral Q breakfast  . fentaNYL      . gabapentin  600 mg Oral BID  . heparin      . losartan  25 mg Oral Daily  . metoprolol      . metoprolol  2.5 mg Intravenous Once  . metoprolol tartrate  12.5 mg Oral TID  . midazolam      . midazolam      . nicotine  7 mg Transdermal Q24H  . nitroGLYCERIN  0.2 mg Transdermal Daily  . pantoprazole  40 mg  Oral Q1200  . potassium phosphate (monobasic)  500 mg Oral BID  . verapamil      . zolpidem  5 mg Oral Once  . DISCONTD: albuterol-ipratropium  4 puff Inhalation Q6H  . DISCONTD: amLODipine  10 mg Oral Daily  . DISCONTD: antiseptic oral rinse  15 mL Mouth Rinse QID  . DISCONTD: chlorhexidine  15 mL Mouth Rinse BID  . DISCONTD: hydrALAZINE  25 mg Oral TID  . DISCONTD: losartan  50 mg Oral BID  . DISCONTD: metoprolol tartrate  12.5 mg Oral BID  . DISCONTD: pantoprazole sodium  40 mg Per Tube Q24H  . DISCONTD: simvastatin  40 mg Oral q1800    Assessment: 71 y.o male presented with STEMI now s/p cath. Received 1 DES however had continued poor flow and was continued on bivalrudin post cath. Xa level therapeutic now. Cont gtt at current rate..  Goal of Therapy:  Heparin level 0.3-0.7 units/ml Monitor platelets by anticoagulation  protocol: Yes   Plan:  Cont gtt at 1200 units/hr and f/u daily labs.  Verlene Mayer, PharmD, BCPS Pager [phone] 03/18/2012 11:46 PM

## 2012-03-18 NOTE — Progress Notes (Signed)
ANTICOAGULATION CONSULT NOTE - Follow Up Consult  Pharmacy consult for heparin Indication: ACS/STEMI  Allergies  Allergen Reactions  . Ceclor (Cefaclor) Other (See Comments)    Reaction unknown    Patient Measurements: Height: 5\' 6"  (167.6 cm) Weight: 136 lb 11 oz (62 kg) IBW/kg (Calculated) : 63.8    Vital Signs: Temp: 99.1 F (37.3 C) (09/27 0026) Temp src: Core (Comment) (09/26 1600) BP: 95/61 mmHg (09/27 0026) Pulse Rate: 64  (09/27 0026)  Labs:  Alvira Philips 03/18/12 0027 03/17/12 2013 03/17/12 1625 03/17/12 0818 03/17/12 0530 03/17/12 0040 03/16/12 0937  HGB -- -- -- -- 12.6* -- 15.2  HCT -- -- -- -- 37.0* -- 43.9  PLT -- -- -- -- 294 -- 330  APTT -- -- -- -- -- -- --  LABPROT -- -- -- -- -- -- --  INR -- -- -- -- -- -- --  HEPARINUNFRC 0.13* -- -- -- -- -- --  CREATININE -- -- -- -- 0.94 0.92 0.93  CKTOTAL -- -- -- -- -- 149 --  CKMB -- -- -- -- -- 5.8* --  TROPONINI -- >20.00* >20.00* >20.00* -- -- --    Estimated Creatinine Clearance: 64.1 ml/min (by C-G formula based on Cr of 0.94).   Medications:  Prescriptions prior to admission  Medication Sig Dispense Refill  . amLODipine (NORVASC) 10 MG tablet Take 10 mg by mouth daily.        . clopidogrel (PLAVIX) 75 MG tablet Take 75 mg by mouth daily.      Marland Kitchen gabapentin (NEURONTIN) 600 MG tablet Take 600 mg by mouth 2 (two) times daily.        . hydrALAZINE (APRESOLINE) 25 MG tablet Take 25 mg by mouth 3 (three) times daily.        Marland Kitchen losartan (COZAAR) 50 MG tablet Take 50 mg by mouth 2 (two) times daily.        . nicotine (NICODERM CQ - DOSED IN MG/24 HOURS) 21 mg/24hr patch Place 1 patch onto the skin daily.      Marland Kitchen oxyCODONE-acetaminophen (PERCOCET) 10-650 MG per tablet Take 1 tablet by mouth every 4 (four) hours as needed. For pain      . potassium phosphate, monobasic, (K-PHOS ORIGINAL) 500 MG tablet Take 500 mg by mouth 2 (two) times daily.        . pravastatin (PRAVACHOL) 40 MG tablet Take 40 mg by mouth daily.         . risedronate (ACTONEL) 35 MG tablet Take 35 mg by mouth every 7 (seven) days. Take on Wednesdays.  Take with water on empty stomach, nothing by mouth or lie down for next 30 minutes.      . traZODone (DESYREL) 150 MG tablet Take 150 mg by mouth at bedtime as needed. For sleep        Assessment: 71 y.o male presented with STEMI  now s/p cath. Received 1 DES however had continued poor flow and was continued on bivalrudin post cath. Xa level subtherapeutic, will increase gtt rate.  Goal of Therapy:  Heparin level 0.3-0.7 units/ml Monitor platelets by anticoagulation protocol: Yes   Plan:  Increase heparin to 1000 units/hr and check 8 hour heparin level.  Verlene Mayer, PharmD, BCPS Pager [phone] 03/18/2012 1:01 AM

## 2012-03-19 LAB — CBC
HCT: 31 % — ABNORMAL LOW (ref 39.0–52.0)
Hemoglobin: 10.5 g/dL — ABNORMAL LOW (ref 13.0–17.0)
MCH: 30.6 pg (ref 26.0–34.0)
MCHC: 33.9 g/dL (ref 30.0–36.0)
MCV: 90.4 fL (ref 78.0–100.0)
Platelets: 253 10*3/uL (ref 150–400)
RBC: 3.43 MIL/uL — ABNORMAL LOW (ref 4.22–5.81)
RDW: 14.3 % (ref 11.5–15.5)
WBC: 15.9 10*3/uL — ABNORMAL HIGH (ref 4.0–10.5)

## 2012-03-19 LAB — COMPREHENSIVE METABOLIC PANEL
ALT: 45 U/L (ref 0–53)
AST: 94 U/L — ABNORMAL HIGH (ref 0–37)
CO2: 30 mEq/L (ref 19–32)
Chloride: 91 mEq/L — ABNORMAL LOW (ref 96–112)
Creatinine, Ser: 1.03 mg/dL (ref 0.50–1.35)
GFR calc non Af Amer: 72 mL/min — ABNORMAL LOW (ref >90)
Total Bilirubin: 0.2 mg/dL — ABNORMAL LOW (ref 0.3–1.2)

## 2012-03-19 MED ORDER — POTASSIUM CHLORIDE CRYS ER 20 MEQ PO TBCR
40.0000 meq | EXTENDED_RELEASE_TABLET | Freq: Once | ORAL | Status: AC
  Administered 2012-03-19: 40 meq via ORAL
  Filled 2012-03-19: qty 2

## 2012-03-19 MED ORDER — TRAZODONE HCL 50 MG PO TABS
75.0000 mg | ORAL_TABLET | Freq: Every day | ORAL | Status: DC
  Administered 2012-03-19 – 2012-03-27 (×9): 75 mg via ORAL
  Filled 2012-03-19 (×10): qty 1

## 2012-03-19 MED ORDER — FUROSEMIDE 10 MG/ML IJ SOLN
20.0000 mg | Freq: Every day | INTRAMUSCULAR | Status: DC
  Administered 2012-03-19: 20 mg via INTRAVENOUS
  Filled 2012-03-19 (×3): qty 2

## 2012-03-19 MED ORDER — FUROSEMIDE 10 MG/ML IJ SOLN: 20.0000 mg | Freq: Once | INTRAMUSCULAR | Status: DC

## 2012-03-19 NOTE — Plan of Care (Signed)
Problem: Phase II Progression Outcomes Goal: Date pt extubated/weaned off vent Outcome: Completed/Met Date Met:  03/19/12 See progress notes    Goal: Time pt extubated/weaned off vent Outcome: Completed/Met Date Met:  03/19/12 See progress notes

## 2012-03-19 NOTE — Progress Notes (Signed)
CARDIAC REHAB PHASE I   PRE:  Rate/Rhythm:nsr 70  BP:  Supine:   Sitting: 108/61  Standing:    SaO2: 92  MODE:  Ambulation:    POST:  Rate/Rhythem: nsr 80  BP:  Supine:   Sitting: 101/65  Standing:    SaO2: 89%  Pt transferred from bed to chair x2 assist.  Pt tolerated activity well, asymptomatic.  Call light and suction tubing within reach.  Pt instructed to call for assistance to get back to bed.  [name] [name], [place]

## 2012-03-19 NOTE — Progress Notes (Signed)
Subjective:  Extubated, on 6L, no CP, SOB  Objective:  Temp:  [97.9 F (36.6 C)-99.5 F (37.5 C)] 98.4 F (36.9 C) (09/28 0700) Pulse Rate:  [62-86] 68  (09/28 0700) Resp:  [13-23] 19  (09/28 0700) BP: (84-121)/(51-67) 117/67 mmHg (09/28 0700) SpO2:  [91 %-99 %] 93 % (09/28 0700) Weight:  [65 kg (143 lb 4.8 oz)] 65 kg (143 lb 4.8 oz) (09/28 0600) Weight change: 2.8 kg (6 lb 2.8 oz)  Intake/Output from previous day: 09/27 0701 - 09/28 0700 In: 2415.8 [P.O.:770; I.V.:1595.8; IV Piggyback:50] Out: 1075 [Urine:1075]  Intake/Output from this shift:    Physical Exam: General appearance: alert, cooperative, appears stated age and no distress Neck: no adenopathy, no carotid bruit, no JVD, supple, symmetrical, trachea midline and thyroid not enlarged, symmetric, no tenderness/mass/nodules Lungs: clear to auscultation bilaterally Heart: regular rate and rhythm, S1, S2 normal, no murmur, click, rub or gallop Extremities: extremities normal, atraumatic, no cyanosis or edema  Lab Results: Results for orders placed during the hospital encounter of 03/16/12 (from the past 48 hour(s))  TROPONIN I     Status: Abnormal   Collection Time   03/17/12  8:18 AM      Component Value Range Comment   Troponin I >20.00 (*) <0.30 ng/mL   CORTISOL     Status: Normal   Collection Time   03/17/12  9:46 AM      Component Value Range Comment   Cortisol, Plasma 20.5     GLUCOSE, CAPILLARY     Status: Abnormal   Collection Time   03/17/12  1:37 PM      Component Value Range Comment   Glucose-Capillary 120 (*) 70 - 99 mg/dL   TROPONIN I     Status: Abnormal   Collection Time   03/17/12  4:25 PM      Component Value Range Comment   Troponin I >20.00 (*) <0.30 ng/mL   GLUCOSE, CAPILLARY     Status: Abnormal   Collection Time   03/17/12  6:16 PM      Component Value Range Comment   Glucose-Capillary 114 (*) 70 - 99 mg/dL   GLUCOSE, CAPILLARY     Status: Abnormal   Collection Time   03/17/12  7:53 PM        Component Value Range Comment   Glucose-Capillary 114 (*) 70 - 99 mg/dL   TROPONIN I     Status: Abnormal   Collection Time   03/17/12  8:13 PM      Component Value Range Comment   Troponin I >20.00 (*) <0.30 ng/mL   GLUCOSE, CAPILLARY     Status: Abnormal   Collection Time   03/17/12 11:13 PM      Component Value Range Comment   Glucose-Capillary 113 (*) 70 - 99 mg/dL   HEPARIN LEVEL (UNFRACTIONATED)     Status: Abnormal   Collection Time   03/18/12 12:27 AM      Component Value Range Comment   Heparin Unfractionated 0.13 (*) 0.30 - 0.70 IU/mL   GLUCOSE, CAPILLARY     Status: Abnormal   Collection Time   03/18/12  4:55 AM      Component Value Range Comment   Glucose-Capillary 106 (*) 70 - 99 mg/dL   COMPREHENSIVE METABOLIC PANEL     Status: Abnormal   Collection Time   03/18/12  4:57 AM      Component Value Range Comment   Sodium 131 (*) 135 - 145 mEq/L  Potassium 4.3  3.5 - 5.1 mEq/L    Chloride 92 (*) 96 - 112 mEq/L    CO2 31  19 - 32 mEq/L    Glucose, Bld 100 (*) 70 - 99 mg/dL    BUN 25 (*) 6 - 23 mg/dL    Creatinine, Ser 4.09  0.50 - 1.35 mg/dL    Calcium 8.3 (*) 8.4 - 10.5 mg/dL    Total Protein 5.6 (*) 6.0 - 8.3 g/dL    Albumin 2.6 (*) 3.5 - 5.2 g/dL    AST 811 (*) 0 - 37 U/L    ALT 62 (*) 0 - 53 U/L    Alkaline Phosphatase 56  39 - 117 U/L    Total Bilirubin 0.2 (*) 0.3 - 1.2 mg/dL    GFR calc non Af Amer 60 (*) >90 mL/min    GFR calc Af Amer 69 (*) >90 mL/min   CBC     Status: Abnormal   Collection Time   03/18/12  4:57 AM      Component Value Range Comment   WBC 19.0 (*) 4.0 - 10.5 K/uL    RBC 3.54 (*) 4.22 - 5.81 MIL/uL    Hemoglobin 10.9 (*) 13.0 - 17.0 g/dL    HCT 91.4 (*) 78.2 - 52.0 %    MCV 90.4  78.0 - 100.0 fL    MCH 30.8  26.0 - 34.0 pg    MCHC 34.1  30.0 - 36.0 g/dL    RDW 95.6  21.3 - 08.6 %    Platelets 255  150 - 400 K/uL   MAGNESIUM     Status: Normal   Collection Time   03/18/12  4:57 AM      Component Value Range Comment    Magnesium 2.2  1.5 - 2.5 mg/dL   PHOSPHORUS     Status: Normal   Collection Time   03/18/12  4:57 AM      Component Value Range Comment   Phosphorus 4.3  2.3 - 4.6 mg/dL   GLUCOSE, CAPILLARY     Status: Normal   Collection Time   03/18/12  7:56 AM      Component Value Range Comment   Glucose-Capillary 90  70 - 99 mg/dL   HEPARIN LEVEL (UNFRACTIONATED)     Status: Abnormal   Collection Time   03/18/12  9:00 AM      Component Value Range Comment   Heparin Unfractionated 0.20 (*) 0.30 - 0.70 IU/mL   HEPARIN LEVEL (UNFRACTIONATED)     Status: Normal   Collection Time   03/18/12 11:08 PM      Component Value Range Comment   Heparin Unfractionated 0.33  0.30 - 0.70 IU/mL   HEPARIN LEVEL (UNFRACTIONATED)     Status: Normal   Collection Time   03/19/12  5:00 AM      Component Value Range Comment   Heparin Unfractionated 0.34  0.30 - 0.70 IU/mL   CBC     Status: Abnormal   Collection Time   03/19/12  5:00 AM      Component Value Range Comment   WBC 15.9 (*) 4.0 - 10.5 K/uL    RBC 3.43 (*) 4.22 - 5.81 MIL/uL    Hemoglobin 10.5 (*) 13.0 - 17.0 g/dL    HCT 57.8 (*) 46.9 - 52.0 %    MCV 90.4  78.0 - 100.0 fL    MCH 30.6  26.0 - 34.0 pg    MCHC 33.9  30.0 - 36.0  g/dL    RDW 30.8  65.7 - 84.6 %    Platelets 253  150 - 400 K/uL   COMPREHENSIVE METABOLIC PANEL     Status: Abnormal   Collection Time   03/19/12  5:00 AM      Component Value Range Comment   Sodium 129 (*) 135 - 145 mEq/L    Potassium 3.6  3.5 - 5.1 mEq/L    Chloride 91 (*) 96 - 112 mEq/L    CO2 30  19 - 32 mEq/L    Glucose, Bld 99  70 - 99 mg/dL    BUN 20  6 - 23 mg/dL    Creatinine, Ser 9.62  0.50 - 1.35 mg/dL    Calcium 8.1 (*) 8.4 - 10.5 mg/dL    Total Protein 5.8 (*) 6.0 - 8.3 g/dL    Albumin 2.7 (*) 3.5 - 5.2 g/dL    AST 94 (*) 0 - 37 U/L    ALT 45  0 - 53 U/L    Alkaline Phosphatase 58  39 - 117 U/L    Total Bilirubin 0.2 (*) 0.3 - 1.2 mg/dL    GFR calc non Af Amer 72 (*) >90 mL/min    GFR calc Af Amer 83 (*) >90  mL/min     Imaging: Imaging results have been reviewed  Assessment/Plan:   1. Principal Problem: 2.  *STEMI, unsuccessful RCA PCI 03/17/12 3. Active Problems: 4.  Tobacco abuse 5.  Acute respiratory failure 6.  Pulmonary edema 7.  COPD with respiratory failure, acute 8.  PVD, history of Lt Ax-fem and fem-fem BPG in past, Dr Manson Passey follows 9.  HTN (hypertension) 10.  Dyslipidemia 11.  History of CVA (cerebrovascular accident) 78.  PAF, post MI 78.  NSVT, post MI 14.   Time Spent Directly with Patient:  30 minutes  Length of Stay:  LOS: 3 days   Looks great. No arrhythmias last night after his amio half rebolus and increase in BB. Exam benign. Good UOP. Labs OK. Still has increased WBC. Will leave pulm treatment/antibiotics etc... To PCCM. OOB to chair. PT. Ambulation. EF by 2D 30 % with inferior WMA as expection. Transition to PO amio tomorrow. . Back on home meds.  Runell Gess 03/19/2012, 8:11 AM

## 2012-03-19 NOTE — Progress Notes (Signed)
ANTICOAGULATION CONSULT NOTE - Follow Up Consult  Pharmacy Consult for heparin Indication: ACS/STEMI  Allergies  Allergen Reactions  . Ceclor (Cefaclor) Other (See Comments)    Reaction unknown    Patient Measurements: Height: 5\' 6"  (167.6 cm) Weight: 143 lb 4.8 oz (65 kg) (per bed scale) IBW/kg (Calculated) : 63.8  Heparin Dosing Weight: 62.2kg  Vital Signs: Temp: 98.4 F (36.9 C) (09/28 0700) Temp src: Axillary (09/28 0700) BP: 101/65 mmHg (09/28 1000) Pulse Rate: 74  (09/28 1000)  Labs:  Basename 03/19/12 0500 03/18/12 2308 03/18/12 0900 03/18/12 0457 03/17/12 2013 03/17/12 1625 03/17/12 0818 03/17/12 0530 03/17/12 0040  HGB 10.5* -- -- 10.9* -- -- -- -- --  HCT 31.0* -- -- 32.0* -- -- -- 37.0* --  PLT 253 -- -- 255 -- -- -- 294 --  APTT -- -- -- -- -- -- -- -- --  LABPROT -- -- -- -- -- -- -- -- --  INR -- -- -- -- -- -- -- -- --  HEPARINUNFRC 0.34 0.33 0.20* -- -- -- -- -- --  CREATININE 1.03 -- -- 1.20 -- -- -- 0.94 --  CKTOTAL -- -- -- -- -- -- -- -- 149  CKMB -- -- -- -- -- -- -- -- 5.8*  TROPONINI -- -- -- -- >20.00* >20.00* >20.00* -- --    Estimated Creatinine Clearance: 60.2 ml/min (by C-G formula based on Cr of 1.03).  Assessment: 71 yo male presented with STEMI now s/p cath on 03/17/12. Received 1 DES however had continued poor flow and is to continue on heparin infusion for medical management. Repeat heparin level remains therapeutic on 1200 units/hr. CBC remains stable from yesterday, plts 253. No bleeding or line issues per RN.   Goal of Therapy:  Heparin level 0.3-0.7 units/ml Monitor platelets by anticoagulation protocol: Yes   Plan:  Continue heparin gtt at 1200 units/hr (=12 ml/hr) Follow up daily heparin level and CBC   Nicolasa Ducking, PharmD Clinical Pharmacist Pgr [phone] 03/19/2012 10:55 AM

## 2012-03-19 NOTE — Progress Notes (Signed)
Name: [name] [name] MRN: [id number] DOB: 31-Jul-1940    LOS: 3  Referring Provider:  EDP Reason for Referral:  Respiratory Failure     PULMONARY / CRITICAL CARE MEDICINE  HPI:  71 yo smoker admitted 03/16/2012 with progressive dyspnea x 2 weeks, acute encephalopathy, respiratory failure with VDRF.  Found to have STEMI, and had emergent cath 9/25.  Followed by Dr. Delton Coombes as outpt. PMHx HTN, CVA, PVD, Hyperlipidemia, COPD  PFT 10/13/11>>FEV1 2.55 (92%), FEV1% 60, TLC 7.11 (118%), DLCO 68%, no BD  Events Since Admission: 9/25 VDRF, STEMI, A fib 9/25 Cardiac cath>>mild/moderate left-sided disease without apparent hemodynamically significant lesion.  Subtotally, occluded distal RCA, with DES performed.  Medical management. 9/27 - extubated  Subjective: 9/28 - 2  X V tach yesterday post exubation - symptomatic. Needed extra amio bolus. Since then NSR. Eating breakfast. No overnight issues. Daughter at bedside. On 6L [place] o2 but no resp disterss  Vital Signs: Temp:  [97.9 F (36.6 C)-99.5 F (37.5 C)] 98.4 F (36.9 C) (09/28 0700) Pulse Rate:  [62-86] 69  (09/28 0800) Resp:  [13-23] 14  (09/28 0800) BP: (84-117)/(51-67) 107/58 mmHg (09/28 0800) SpO2:  [91 %-99 %] 92 % (09/28 0912) Weight:  [65 kg (143 lb 4.8 oz)] 65 kg (143 lb 4.8 oz) (09/28 0600)  Physical Examination: General - no distress HEENT - Suppler neck. PEERL + Cardiac - s1,s2 irregular Chest - Rt [place] CVL site clean, no wheeze/rales Abd - soft, non-tender, +bowel sounds Ext - no edema Neuro -Alert, follows commands, moves all extremities. GCS 15. RASS 0. Eating. CAM-ICU negative for delirium  Dg Chest Port 1 View  03/18/2012  *RADIOLOGY REPORT*  Clinical Data: Assess endotracheal tube.  PORTABLE CHEST - 1 VIEW  Comparison: 03/17/2012.  Findings: Endotracheal tube, nasogastric tube and right subclavian central line remain present.  The central line has been withdrawn and the tip is in the upper to mid superior vena cava,  above the level of the carina, previously seen lower.  The pulmonary aeration appears little changed allowing for differences in technique. Cardiopericardial silhouette unchanged.  IMPRESSION:  1.  Unchanged endotracheal tube and nasogastric tube. 2.  Retraction of right subclavian central line to the level of the upper to mid superior vena cava. 3.  No interval change in pulmonary aeration.   Original Report Authenticated By: Andreas Newport, M.D.      ASSESSMENT AND PLAN  PULMONARY  Lab 03/17/12 0537 03/16/12 2324 03/16/12 1334 03/16/12 0950  PHART 7.439 7.546* 7.392 7.475*  PCO2ART 42.7 36.8 52.0* 50.6*  PO2ART 59.0* 66.0* 60.0* 106.0*  HCO3 29.0* 31.9* 31.6* 37.4*  O2SAT 91.0 95.0 90.0 98.0   Ventilator Settings:    ETT:  9/25>>>9/27  A:   Acute respiratory failure 2nd to AECOPD, home narcotics/sedatives, and STEMI. - on 03/19/12: s/p extubation 9/27. On 6L [place] but not in resp distress  P:   - start Incenive spiroemtry and diuresis 03/19/12 -f/u CXR intermittently -add scheduled BD's; no need for steroids at this time -continue nicotine patch 7 mg daily  CARDIOVASCULAR  Lab 03/17/12 2013 03/17/12 1625 03/17/12 0818 03/17/12 0040 03/16/12 2105 03/16/12 0939  TROPONINI >20.00* >20.00* >20.00* 0.73* 0.63* --  LATICACIDVEN -- -- -- -- -- --  PROBNP -- -- -- -- -- 2876.0*    Lines:   9/25 Rt  TLC >>> 9/25 R radial a-line>>9/25  A:  STEMI.- EF 9/26 35% Hypotension likely from hypovolemia>>improve 9/27. A fib. Hx of HTN, Hyperlipidemia, PVD.  -  on 03/19/12: 2 x V tach on 9/27 and amio gtt given Now in NSR. Likely volume overloaded  P:  - start daily lasiox 03/19/12 -goal CVP > 8 -norvasc, hydralazine, lopressor, losartan per cardiology -continue plavix, ASA -continue zocor -amiodarone, heparin gtt's per cardiology  RENAL  Lab 03/19/12 0500 03/18/12 0457 03/17/12 0530 03/17/12 0040 03/16/12 0937  NA 129* 131* 126* 127* 127*  K 3.6 4.3 -- -- --  CL 91* 92* 86* 87*  82*  CO2 30 31 31 29  34*  BUN 20 25* 18 17 14   CREATININE 1.03 1.20 0.94 0.92 0.93  CALCIUM 8.1* 8.3* 8.6 8.8 9.6  MG -- 2.2 2.0 -- --  PHOS -- 4.3 3.9 -- --   Intake/Output      09/27 0701 - 09/28 0700 09/28 0701 - 09/29 0700   P.O. 770    I.V. (mL/kg) 1595.8 (24.6) 48.7 (0.7)   NG/GT     IV Piggyback 50    Total Intake(mL/kg) 2415.8 (37.2) 48.7 (0.7)   Urine (mL/kg/hr) 1075 (0.7)    Total Output 1075    Net +1340.8 +48.7         Foley:  9/25>>>  A:   Hyponatremia >> improved/stabe Hypokalemia  Mild  P:   - MAg goal > 2 K goal > 4 -f/u BMET -monitor urine outpt, electrolytes, renal fx  GASTROINTESTINAL  Lab 03/19/12 0500 03/18/12 0457 03/16/12 1906  AST 94* 257* 18  ALT 45 62* 10  ALKPHOS 58 56 65  BILITOT 0.2* 0.2* 0.3  PROT 5.8* 5.6* 6.5  ALBUMIN 2.7* 2.6* 3.0*    A:   Nutrition. Increased LFT's likely from hypotension.  P:   -advance diet as tolerated after extubation -f/u LFT's>if persistently elevated then may need to hold statin  HEMATOLOGIC  Lab 03/19/12 0500 03/18/12 0457 03/17/12 0530 03/16/12 0937  HGB 10.5* 10.9* 12.6* 15.2  HCT 31.0* 32.0* 37.0* 43.9  PLT 253 255 294 330  INR -- -- -- --  APTT -- -- -- --   A:  Anemia>>likely hemodilutional  P:  - PRBC for hgb </= 8gm% in view of ACS exception is active bleeding with hemodynamic instability, then transfuse regardless of hemoglobin value.  At at all times try to transfuse 1 unit prbc as possible with exception of active hemorrhage    INFECTIOUS  Lab 03/19/12 0500 03/18/12 0457 03/17/12 0530 03/16/12 0937  WBC 15.9* 19.0* 21.4* 20.4*  PROCALCITON -- -- -- --   Cultures: 9/25 BCx2>>> 9/25 Sputum>>> 9/25 U. Strep>>>negative 9/25 U. Legion>>>negative  Antibiotics: Rocephin (AECOPD) 9/25>>> Zithromax (AECOPD) 9/25>>9/25  A:   Acute exacerbation of COPD  P:   -D4/7 rocephin  ENDOCRINE  Lab 03/18/12 0756 03/18/12 0455 03/17/12 2313 03/17/12 1953 03/17/12 1816  GLUCAP 90  106* 113* 114* 114*   A:   No acute issues  P:   -d/c CBG checks  NEUROLOGIC  A:   Encephalopathy>>resolved Hx of CVA P:   -continue plavix - PT consult 03/19/12   BEST PRACTICE / DISPOSITION Level of Care:  ICU Primary Service: [facility] cardiology Consultants:  PCCM Code Status:  LCB, no CPR and no cardioversion. Diet: Heart healthy DVT Px: Heparin gtt GI Px:  Protonix Skin Integrity:  Intact Social / Family:  Daughter  Updated at bedside   The patient is critically ill with multiple organ systems failure and requires high complexity decision making for assessment and support, frequent evaluation and titration of therapies, application of advanced monitoring technologies and  extensive interpretation of multiple databases.   Critical Care Time devoted to patient care services described in this note is  35  Minutes.  Dr. Kalman Shan, M.D., [facility].C.P Pulmonary and Critical Care Medicine Staff Physician [facility] [place] [facility] Pager: [phone], If no answer or between  15:00h - 7:00h: call [phone]  03/19/2012 9:37 AM

## 2012-03-20 ENCOUNTER — Inpatient Hospital Stay (HOSPITAL_COMMUNITY): Payer: Medicare Other

## 2012-03-20 DIAGNOSIS — I472 Ventricular tachycardia, unspecified: Secondary | ICD-10-CM

## 2012-03-20 LAB — CBC
Hemoglobin: 10.4 g/dL — ABNORMAL LOW (ref 13.0–17.0)
MCH: 30.8 pg (ref 26.0–34.0)
MCHC: 34.1 g/dL (ref 30.0–36.0)
MCV: 90.2 fL (ref 78.0–100.0)
RBC: 3.38 MIL/uL — ABNORMAL LOW (ref 4.22–5.81)

## 2012-03-20 LAB — BASIC METABOLIC PANEL
CO2: 29 mEq/L (ref 19–32)
Calcium: 8 mg/dL — ABNORMAL LOW (ref 8.4–10.5)
Creatinine, Ser: 0.96 mg/dL (ref 0.50–1.35)
GFR calc non Af Amer: 82 mL/min — ABNORMAL LOW (ref >90)
Glucose, Bld: 106 mg/dL — ABNORMAL HIGH (ref 70–99)
Sodium: 129 mEq/L — ABNORMAL LOW (ref 135–145)

## 2012-03-20 LAB — HEPARIN LEVEL (UNFRACTIONATED): Heparin Unfractionated: 0.32 IU/mL (ref 0.30–0.70)

## 2012-03-20 MED ORDER — AMIODARONE HCL 200 MG PO TABS
200.0000 mg | ORAL_TABLET | Freq: Two times a day (BID) | ORAL | Status: DC
  Administered 2012-03-20 – 2012-03-24 (×10): 200 mg via ORAL
  Filled 2012-03-20 (×12): qty 1

## 2012-03-20 MED ORDER — IPRATROPIUM BROMIDE 0.02 % IN SOLN: 0.5000 mg | Freq: Three times a day (TID) | RESPIRATORY_TRACT | Status: DC

## 2012-03-20 MED ORDER — MAGNESIUM SULFATE 40 MG/ML IJ SOLN
2.0000 g | Freq: Once | INTRAMUSCULAR | Status: AC
  Administered 2012-03-20: 2 g via INTRAVENOUS
  Filled 2012-03-20: qty 50

## 2012-03-20 MED ORDER — ALBUTEROL SULFATE (5 MG/ML) 0.5% IN NEBU: 2.5000 mg | INHALATION_SOLUTION | Freq: Three times a day (TID) | RESPIRATORY_TRACT | Status: DC

## 2012-03-20 MED ORDER — DOXYCYCLINE HYCLATE 100 MG PO TABS
100.0000 mg | ORAL_TABLET | Freq: Two times a day (BID) | ORAL | Status: AC
  Administered 2012-03-20 – 2012-03-21 (×3): 100 mg via ORAL
  Filled 2012-03-20 (×4): qty 1

## 2012-03-20 MED ORDER — DEXTROSE 5 % IV SOLN
2.0000 g | Freq: Once | INTRAVENOUS | Status: DC
  Filled 2012-03-20: qty 4

## 2012-03-20 MED ORDER — HYDROCODONE-ACETAMINOPHEN 5-325 MG PO TABS
1.0000 | ORAL_TABLET | Freq: Once | ORAL | Status: AC
  Administered 2012-03-20: 1 via ORAL
  Filled 2012-03-20: qty 1

## 2012-03-20 MED ORDER — IPRATROPIUM BROMIDE 0.02 % IN SOLN
0.5000 mg | Freq: Three times a day (TID) | RESPIRATORY_TRACT | Status: DC
  Administered 2012-03-21: 0.5 mg via RESPIRATORY_TRACT
  Filled 2012-03-20 (×2): qty 2.5

## 2012-03-20 MED ORDER — ALBUTEROL SULFATE (5 MG/ML) 0.5% IN NEBU
2.5000 mg | INHALATION_SOLUTION | Freq: Three times a day (TID) | RESPIRATORY_TRACT | Status: DC
  Administered 2012-03-21: 2.5 mg via RESPIRATORY_TRACT
  Filled 2012-03-20 (×2): qty 0.5

## 2012-03-20 MED ORDER — FUROSEMIDE 10 MG/ML IJ SOLN
40.0000 mg | Freq: Every day | INTRAMUSCULAR | Status: DC
  Administered 2012-03-20 – 2012-03-22 (×3): 40 mg via INTRAVENOUS
  Filled 2012-03-20 (×4): qty 4

## 2012-03-20 MED ORDER — ENOXAPARIN SODIUM 40 MG/0.4ML ~~LOC~~ SOLN
40.0000 mg | SUBCUTANEOUS | Status: DC
  Administered 2012-03-20 – 2012-04-06 (×18): 40 mg via SUBCUTANEOUS
  Filled 2012-03-20 (×20): qty 0.4

## 2012-03-20 MED ORDER — ALBUTEROL SULFATE (5 MG/ML) 0.5% IN NEBU: 2.5000 mg | INHALATION_SOLUTION | Freq: Four times a day (QID) | RESPIRATORY_TRACT | Status: DC

## 2012-03-20 NOTE — Progress Notes (Addendum)
ANTICOAGULATION CONSULT NOTE - Follow Up Consult  Pharmacy Consult for heparin --> Lovenox Indication: ACS/STEMI --> VTE ppx  Allergies  Allergen Reactions  . Ceclor (Cefaclor) Other (See Comments)    Reaction unknown    Patient Measurements: Height: 5\' 6"  (167.6 cm) Weight: 143 lb 4.8 oz (65 kg) (per bed scale) IBW/kg (Calculated) : 63.8  Heparin Dosing Weight: 62.2kg  Vital Signs: Temp: 98.2 F (36.8 C) (09/29 0344) Temp src: Oral (09/29 0344) BP: 108/55 mmHg (09/29 0600) Pulse Rate: 68  (09/29 0600)  Labs:  Basename 03/20/12 0418 03/19/12 0500 03/18/12 2308 03/18/12 0457 03/17/12 2013 03/17/12 1625  HGB 10.4* 10.5* -- -- -- --  HCT 30.5* 31.0* -- 32.0* -- --  PLT 242 253 -- 255 -- --  APTT -- -- -- -- -- --  LABPROT -- -- -- -- -- --  INR -- -- -- -- -- --  HEPARINUNFRC 0.32 0.34 0.33 -- -- --  CREATININE 0.96 1.03 -- 1.20 -- --  CKTOTAL -- -- -- -- -- --  CKMB -- -- -- -- -- --  TROPONINI -- -- -- -- >20.00* >20.00*    Estimated Creatinine Clearance: 64.6 ml/min (by C-G formula based on Cr of 0.96).  Assessment: 71 yo male presented with STEMI now s/p cath on 03/17/12. Received 1 DES however had continued poor flow and was continued on heparin infusion for medical management. Heparin level this AM therapeutic (0.32) on 1200 units/hr; infusion turned off ~1h ago. Now to transition to Lovenox at VTE prophylaxis dosing; pt weighs 65 kg (BMI<30), CrCl ~65 ml/min. CBC remains stable from yesterday, plts 242. No bleeding noted.    Goal of Therapy:  Heparin level 0.3-0.7 units/ml Monitor platelets by anticoagulation protocol: Yes   Plan:  Lovenox 40 mg SQ daily  D/c heparin levels  Nicolasa Ducking, PharmD Clinical Pharmacist Pgr [phone] 03/20/2012 8:44 AM

## 2012-03-20 NOTE — Progress Notes (Signed)
Name: [name] [name] MRN: [id number] DOB: 1940/07/11    LOS: 4  Referring Provider:  EDP Reason for Referral:  Respiratory Failure     PULMONARY / CRITICAL CARE MEDICINE  HPI:  71 yo smoker admitted 03/16/2012 with progressive dyspnea x 2 weeks, acute encephalopathy, respiratory failure with VDRF.  Found to have STEMI, and had emergent cath 9/25.  Followed by Dr. Delton Coombes as outpt. PMHx HTN, CVA, PVD, Hyperlipidemia, COPD  PFT 10/13/11>>FEV1 2.55 (92%), FEV1% 60, TLC 7.11 (118%), DLCO 68%, no BD  Events Since Admission: 9/25 VDRF, STEMI, A fib 9/25 Cardiac cath>>mild/moderate left-sided disease without apparent hemodynamically significant lesion.  Subtotally, occluded distal RCA, with DES performed.  Medical management. 9/27 - extubated 9/28 - 2  X V tach yesterday post exubation - symptomatic. Needed extra amio bolus. Since then NSR. Eating breakfast. No overnight issues. Daughter at bedside. On 6L [place] o2 but no resp disterss  Subjective:  9/29 -  Rough night due to insomnia, back pain and lack of baseline trazodone. Slept after trazodone and vicodin. Also, RT forearm IV heparin infiltrated and has hematoma. And, also pulled out CVL and is dislodged. Otherwise no issues. O2 down to 4L [place]   Vital Signs: Temp:  [98.1 F (36.7 C)-98.9 F (37.2 C)] 98.9 F (37.2 C) (09/29 0800) Pulse Rate:  [61-76] 76  (09/29 0900) Resp:  [13-24] 19  (09/29 0900) BP: (96-126)/(49-74) 112/49 mmHg (09/29 0900) SpO2:  [90 %-99 %] 93 % (09/29 0900)  Physical Examination: General - no distress HEENT - Suppler neck. PEERL + Cardiac - s1,s2 irregular Chest - Rt [place] CVL site clean, no wheeze/rales Abd - soft, non-tender, +bowel sounds Ext - no edema but RT forearm mid aspect has 10 cm hematoma  Neuro -Alert, follows commands, moves all extremities. GCS 15. RASS 0. Eating. CAM-ICU negative for delirium  Dg Chest Port 1 View  03/20/2012  *RADIOLOGY REPORT*  Clinical Data: Right subclavian line pulled  back.  Evaluate line position.  PORTABLE CHEST - 1 VIEW  Comparison: 03/18/2012  Findings: Increased interstitial densities throughout both lungs are suggestive for pulmonary edema.  Heart size is upper limits of normal.  The endotracheal tube and nasogastric tube have been removed.  Right subclavian central line tip is near the junction of the right innominate vein and SVC.  IMPRESSION: Diffusely increased interstitial lung densities.  Findings are suggestive for pulmonary edema.  Central line tip near the junction of the right innominate vein and SVC.   Original Report Authenticated By: Richarda Overlie, M.D.      ASSESSMENT AND PLAN  PULMONARY  Lab 03/17/12 0537 03/16/12 2324 03/16/12 1334 03/16/12 0950  PHART 7.439 7.546* 7.392 7.475*  PCO2ART 42.7 36.8 52.0* 50.6*  PO2ART 59.0* 66.0* 60.0* 106.0*  HCO3 29.0* 31.9* 31.6* 37.4*  O2SAT 91.0 95.0 90.0 98.0   Ventilator Settings:    ETT:  9/25>>>9/27  A:   Acute respiratory failure 2nd to AECOPD, home narcotics/sedatives, and STEMI. - on 03/19/12: s/p extubation 9/27. On 6L [place] but not in resp distress - on 03/20/12: down to 4L [place]  P:   - continue Incenive spiroemtry and diuresis since  03/20/12 -f/u CXR intermittently - scheduled BD's; no need for steroids at this time -continue nicotine patch 7 mg daily  CARDIOVASCULAR  Lab 03/20/12 0419 03/17/12 2013 03/17/12 1625 03/17/12 0818 03/17/12 0040 03/16/12 2105 03/16/12 0939  TROPONINI -- >20.00* >20.00* >20.00* 0.73* 0.63* --  LATICACIDVEN -- -- -- -- -- -- --  PROBNP  7722.0* -- -- -- -- -- 2876.0*    Lines:   9/25 Rt [place] TLC >>>03/20/12 9/25 R radial a-line>>9/25  A:  STEMI.- EF 9/26 35% Hypotension likely from hypovolemia>>improve 9/27. A fib. Hx of HTN, Hyperlipidemia, PVD.  - on 03/19/12: 2 x V tach on 9/27 and amio gtt given Now in NSR. Likely volume overloaded  - 03/20/12: improved hypoxemia with daily lasix. BNP very high. Has hematoma rt forearm due to heparin  infiltration  P:  - increase  daily lasix on 03/20/12 -goal CVP > 8 -norvasc, hydralazine, lopressor, losartan per cardiology -continue plavix, ASA -continue zocor -amiodarone po since 9/28 - dc Iv heparin and change to sq dvt proph lovenox on 03/20/12  RENAL  Lab 03/20/12 0418 03/19/12 0500 03/18/12 0457 03/17/12 0530 03/17/12 0040  NA 129* 129* 131* 126* 127*  K 3.6 3.6 -- -- --  CL 92* 91* 92* 86* 87*  CO2 29 30 31 31 29   BUN 13 20 25* 18 17  CREATININE 0.96 1.03 1.20 0.94 0.92  CALCIUM 8.0* 8.1* 8.3* 8.6 8.8  MG 1.7 -- 2.2 2.0 --  PHOS 3.2 -- 4.3 3.9 --   Intake/Output      09/28 0701 - 09/29 0700 09/29 0701 - 09/30 0700   P.O. 650 240   I.V. (mL/kg) 945.4 (14.5) 26.7 (0.4)   IV Piggyback 52    Total Intake(mL/kg) 1647.4 (25.3) 266.7 (4.1)   Urine (mL/kg/hr) 2945 (1.9) 250   Total Output 2945 250   Net -1297.6 +16.7        Stool Occurrence 1 x     Foley:  9/25>>>  A:   Hyponatremia >> improved/stabe but poses risk for balance issues Hypomag - mild  P:   - MAg goal > 2; will replete on 03/20/12 K goal > 4 -f/u BMET -monitor urine outpt, electrolytes, renal fx  GASTROINTESTINAL  Lab 03/19/12 0500 03/18/12 0457 03/16/12 1906  AST 94* 257* 18  ALT 45 62* 10  ALKPHOS 58 56 65  BILITOT 0.2* 0.2* 0.3  PROT 5.8* 5.6* 6.5  ALBUMIN 2.7* 2.6* 3.0*    A:   Nutrition. Increased LFT's likely from hypotension.  - on 03/19/12: LFTs normal  P:   -heart healthy diet   HEMATOLOGIC  Lab 03/20/12 0418 03/19/12 0500 03/18/12 0457 03/17/12 0530 03/16/12 0937  HGB 10.4* 10.5* 10.9* 12.6* 15.2  HCT 30.5* 31.0* 32.0* 37.0* 43.9  PLT 242 253 255 294 330  INR -- -- -- -- --  APTT -- -- -- -- --   A:  Anemia>>likely hemodilutional  P:  - PRBC for hgb </= 8gm% in view of ACS exception is active bleeding with hemodynamic instability, then transfuse regardless of hemoglobin value.  At at all times try to transfuse 1 unit prbc as possible with exception of active  hemorrhage    INFECTIOUS  Lab 03/20/12 0418 03/19/12 0500 03/18/12 0457 03/17/12 0530 03/16/12 0937  WBC 13.8* 15.9* 19.0* 21.4* 20.4*  PROCALCITON -- -- -- -- --   Cultures: 9/25 BCx2>>> 9/25 Sputum>>> 9/25 U. Strep>>>negative 9/25 U. Legion>>>negative    Antibiotics: Rocephin (AECOPD) 9/25>>>9/29 Zithromax (AECOPD) 9/25>>9/25 Doxycycline po (AECOPD) 9/29 >> 03/23/12 A:   Acute exacerbation of COPD  P:   -change to 2 more days of po doxy on 03/18/12  ENDOCRINE  Lab 03/18/12 0756 03/18/12 0455 03/17/12 2313 03/17/12 1953 03/17/12 1816  GLUCAP 90 106* 113* 114* 114*   A:   No acute issues  P:   -  d/c CBG checks  NEUROLOGIC  A:   Encephalopathy>>resolved Hx of CVA P:   -continue plavix - PT consult ordered  03/19/12   BEST PRACTICE / DISPOSITION Level of Care:  ICU Primary Service: [facility] cardiology Consultants:  PCCM Code Status:  LCB, no CPR and no cardioversion. Diet: Heart healthy DVT Px: Heparin gtt GI Px:  Protonix Skin Integrity:  Intact Social / Family:  Daughter  Updated at bedside 9/28. No family at bedside 03/20/12    Dr. Kalman Shan, M.D., [facility].C.P Pulmonary and Critical Care Medicine Staff Physician [facility] [place] [facility] Critical [facility] Pager: [phone], If no answer or between  15:00h - 7:00h: call [phone]  03/20/2012 9:24 AM

## 2012-03-20 NOTE — Progress Notes (Signed)
Subjective:  No CP/SOB  Objective:  Temp:  [98.1 F (36.7 C)-98.7 F (37.1 C)] 98.2 F (36.8 C) (09/29 0344) Pulse Rate:  [61-74] 68  (09/29 0600) Resp:  [13-24] 20  (09/29 0600) BP: (96-126)/(54-74) 108/55 mmHg (09/29 0600) SpO2:  [87 %-99 %] 94 % (09/29 0600) Weight change:   Intake/Output from previous day: 09/28 0701 - 09/29 0700 In: 1608.7 [P.O.:650; I.V.:906.7; IV Piggyback:52] Out: 2945 [Urine:2945]  Intake/Output from this shift:    Physical Exam: General appearance: alert, cooperative, appears stated age and no distress Neck: no adenopathy, no carotid bruit, no JVD, supple, symmetrical, trachea midline and thyroid not enlarged, symmetric, no tenderness/mass/nodules Lungs: clear to auscultation bilaterally Heart: regular rate and rhythm, S1, S2 normal, no murmur, click, rub or gallop Extremities: extremities normal, atraumatic, no cyanosis or edema  Lab Results: Results for orders placed during the hospital encounter of 03/16/12 (from the past 48 hour(s))  GLUCOSE, CAPILLARY     Status: Normal   Collection Time   03/18/12  7:56 AM      Component Value Range Comment   Glucose-Capillary 90  70 - 99 mg/dL   HEPARIN LEVEL (UNFRACTIONATED)     Status: Abnormal   Collection Time   03/18/12  9:00 AM      Component Value Range Comment   Heparin Unfractionated 0.20 (*) 0.30 - 0.70 IU/mL   HEPARIN LEVEL (UNFRACTIONATED)     Status: Normal   Collection Time   03/18/12 11:08 PM      Component Value Range Comment   Heparin Unfractionated 0.33  0.30 - 0.70 IU/mL   HEPARIN LEVEL (UNFRACTIONATED)     Status: Normal   Collection Time   03/19/12  5:00 AM      Component Value Range Comment   Heparin Unfractionated 0.34  0.30 - 0.70 IU/mL   CBC     Status: Abnormal   Collection Time   03/19/12  5:00 AM      Component Value Range Comment   WBC 15.9 (*) 4.0 - 10.5 K/uL    RBC 3.43 (*) 4.22 - 5.81 MIL/uL    Hemoglobin 10.5 (*) 13.0 - 17.0 g/dL    HCT 16.1 (*) 09.6 - 52.0 %    MCV 90.4  78.0 - 100.0 fL    MCH 30.6  26.0 - 34.0 pg    MCHC 33.9  30.0 - 36.0 g/dL    RDW 04.5  40.9 - 81.1 %    Platelets 253  150 - 400 K/uL   COMPREHENSIVE METABOLIC PANEL     Status: Abnormal   Collection Time   03/19/12  5:00 AM      Component Value Range Comment   Sodium 129 (*) 135 - 145 mEq/L    Potassium 3.6  3.5 - 5.1 mEq/L    Chloride 91 (*) 96 - 112 mEq/L    CO2 30  19 - 32 mEq/L    Glucose, Bld 99  70 - 99 mg/dL    BUN 20  6 - 23 mg/dL    Creatinine, Ser 9.14  0.50 - 1.35 mg/dL    Calcium 8.1 (*) 8.4 - 10.5 mg/dL    Total Protein 5.8 (*) 6.0 - 8.3 g/dL    Albumin 2.7 (*) 3.5 - 5.2 g/dL    AST 94 (*) 0 - 37 U/L    ALT 45  0 - 53 U/L    Alkaline Phosphatase 58  39 - 117 U/L    Total Bilirubin 0.2 (*)  0.3 - 1.2 mg/dL    GFR calc non Af Amer 72 (*) >90 mL/min    GFR calc Af Amer 83 (*) >90 mL/min   HEPARIN LEVEL (UNFRACTIONATED)     Status: Normal   Collection Time   03/20/12  4:18 AM      Component Value Range Comment   Heparin Unfractionated 0.32  0.30 - 0.70 IU/mL   CBC     Status: Abnormal   Collection Time   03/20/12  4:18 AM      Component Value Range Comment   WBC 13.8 (*) 4.0 - 10.5 K/uL    RBC 3.38 (*) 4.22 - 5.81 MIL/uL    Hemoglobin 10.4 (*) 13.0 - 17.0 g/dL    HCT 78.2 (*) 95.6 - 52.0 %    MCV 90.2  78.0 - 100.0 fL    MCH 30.8  26.0 - 34.0 pg    MCHC 34.1  30.0 - 36.0 g/dL    RDW 21.3  08.6 - 57.8 %    Platelets 242  150 - 400 K/uL   BASIC METABOLIC PANEL     Status: Abnormal   Collection Time   03/20/12  4:18 AM      Component Value Range Comment   Sodium 129 (*) 135 - 145 mEq/L    Potassium 3.6  3.5 - 5.1 mEq/L    Chloride 92 (*) 96 - 112 mEq/L    CO2 29  19 - 32 mEq/L    Glucose, Bld 106 (*) 70 - 99 mg/dL    BUN 13  6 - 23 mg/dL    Creatinine, Ser 4.69  0.50 - 1.35 mg/dL    Calcium 8.0 (*) 8.4 - 10.5 mg/dL    GFR calc non Af Amer 82 (*) >90 mL/min    GFR calc Af Amer >90  >90 mL/min   MAGNESIUM     Status: Normal   Collection Time    03/20/12  4:18 AM      Component Value Range Comment   Magnesium 1.7  1.5 - 2.5 mg/dL   PHOSPHORUS     Status: Normal   Collection Time   03/20/12  4:18 AM      Component Value Range Comment   Phosphorus 3.2  2.3 - 4.6 mg/dL   PRO B NATRIURETIC PEPTIDE     Status: Abnormal   Collection Time   03/20/12  4:19 AM      Component Value Range Comment   Pro B Natriuretic peptide (BNP) 7722.0 (*) 0 - 125 pg/mL     Imaging: Imaging results have been reviewed  Assessment/Plan:   1. Principal Problem: 2.  *STEMI, unsuccessful RCA PCI 03/17/12 3. Active Problems: 4.  Tobacco abuse 5.  Acute respiratory failure 6.  Pulmonary edema 7.  COPD with respiratory failure, acute 8.  PVD, history of Lt Ax-fem and fem-fem BPG in past, Dr Manson Passey follows 9.  HTN (hypertension) 10.  Dyslipidemia 11.  History of CVA (cerebrovascular accident) 53.  PAF, post MI 46.  NSVT, post MI 14.   Time Spent Directly with Patient:  20 minutes  Length of Stay:  LOS: 4 days   S/P inferior STEMI. EF 30%. NSVT no longer present on IV amio. Exam benign except for infiltrated IV Right arm. Labs OK except fpr increased BNP 7722. CXR with mild interstitial edema. Plan transfer to stepdown. D/C iv hep and change to lovenox for DVT prophylaxis. Change iv to po Amio. PT/OT, ambulate.   Nanetta Batty  J 03/20/2012, 7:52 AM

## 2012-03-20 NOTE — Evaluation (Signed)
Physical Therapy Evaluation Patient Details Name: [name] [name] MRN: [id number] DOB: 1940/08/11 Today's Date: 03/20/2012 Time: 4098-1191 PT Time Calculation (min): 23 min  PT Assessment / Plan / Recommendation Clinical Impression  Pt admitted with acute encephalopathy, respiratory failure with VDRF.  Found to have STEMI, and had emergent cath 9/25. Pt limited by endurance and shortness of breath with activity as well as weakness from prolonged inactivity. Pt will benefit from skilled PT in the acute care setting in order to maximize functional mobility, strength and endurance prior to d/c home    PT Assessment  Patient needs continued PT services    Follow Up Recommendations  No PT follow up;Supervision - Intermittent    Barriers to Discharge Decreased caregiver support intermittent family during teh day, can be 24hr if necessary    Equipment Recommendations  None recommended by PT    Recommendations for Other Services     Frequency Min 3X/week    Precautions / Restrictions Precautions Precautions: None Restrictions Weight Bearing Restrictions: No   Pertinent Vitals/Pain No complaints of pain      Mobility  Bed Mobility Bed Mobility: Not assessed Transfers Transfers: Sit to Stand;Stand to Sit Sit to Stand: 4: Min guard;With upper extremity assist;From chair/3-in-1 Stand to Sit: 4: Min guard;With upper extremity assist;To chair/3-in-1 Details for Transfer Assistance: VC for hand placement. Minguard for safety Ambulation/Gait Ambulation/Gait Assistance: 4: Min assist Ambulation Distance (Feet): 50 Feet Assistive device: 1 person hand held assist Ambulation/Gait Assistance Details: Min assist with 1 person HHA for stability. Pt bearing minimal weight through PT's hand for support. Distance limited by endurance.  Gait Pattern: Within Functional Limits    Shoulder Instructions     Exercises General Exercises - Lower Extremity Long Arc Quad: AROM;Strengthening;Both;10  reps;Seated Hip ABduction/ADduction: AROM;Strengthening;Both;10 reps;Seated Straight Leg Raises: AROM;Strengthening;Both;10 reps;Supine Hip Flexion/Marching: AROM;Strengthening;Both;10 reps;Seated   PT Diagnosis: Generalized weakness  PT Problem List: Decreased strength;Decreased activity tolerance;Decreased mobility;Cardiopulmonary status limiting activity PT Treatment Interventions: DME instruction;Gait training;Functional mobility training;Therapeutic activities;Therapeutic exercise;Patient/family education   PT Goals Acute Rehab PT Goals PT Goal Formulation: With patient Time For Goal Achievement: 03/27/12 Potential to Achieve Goals: Good Pt will go Sit to Stand: with modified independence PT Goal: Sit to Stand - Progress: Goal set today Pt will go Stand to Sit: with modified independence PT Goal: Stand to Sit - Progress: Goal set today Pt will Transfer Bed to Chair/Chair to Bed: with modified independence PT Transfer Goal: Bed to Chair/Chair to Bed - Progress: Goal set today Pt will Ambulate: >150 feet;with modified independence;with least restrictive assistive device;Other (comment) (with maximum 2 DOE) PT Goal: Ambulate - Progress: Goal set today Pt will Perform Home Exercise Program: Independently PT Goal: Perform Home Exercise Program - Progress: Goal set today  Visit Information  Last PT Received On: 03/20/12 Assistance Needed: +1    Subjective Data      Prior Functioning  Home Living Lives With: Other (Comment) (granddaughter) Available Help at Discharge: Family;Other (Comment) (someone could be there all the time) Type of Home: House Home Access: Stairs to enter [employer] of Steps: 1 Entrance Stairs-Rails: None Home Layout: One level Bathroom Shower/Tub: Tub/shower unit;Walk-in shower Bathroom Toilet: Standard Bathroom Accessibility: Yes How Accessible: Accessible via walker Home Adaptive Equipment: Shower chair without back Prior Function Level  of Independence: Independent Able to Take Stairs?: Yes Driving: Yes 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, prison and probation services Communication: No difficulties Dominant Hand: Right    Cognition  Overall Cognitive Status: Appears within  functional limits for tasks assessed/performed Arousal/Alertness: Awake/alert Orientation Level: Appears intact for tasks assessed Behavior During Session: [facility] for tasks performed    Extremity/Trunk Assessment Right Lower Extremity Assessment RLE ROM/Strength/Tone: [facility] for tasks assessed Left Lower Extremity Assessment LLE ROM/Strength/Tone: Within functional levels   Balance    End of Session PT - End of Session Equipment Utilized During Treatment: Gait belt Activity Tolerance: Patient tolerated treatment well Patient left: in chair;with call bell/phone within reach;with family/visitor present Nurse Communication: Mobility status    Milana Kidney 03/20/2012, 5:30 PM  03/20/2012 Milana Kidney DPT PAGER: [phone] OFFICE: [phone]

## 2012-03-21 DIAGNOSIS — I255 Ischemic cardiomyopathy: Secondary | ICD-10-CM | POA: Diagnosis present

## 2012-03-21 LAB — BASIC METABOLIC PANEL
BUN: 11 mg/dL (ref 6–23)
CO2: 31 mEq/L (ref 19–32)
Calcium: 8.5 mg/dL (ref 8.4–10.5)
Chloride: 88 mEq/L — ABNORMAL LOW (ref 96–112)
Creatinine, Ser: 0.96 mg/dL (ref 0.50–1.35)
GFR calc Af Amer: >90 mL/min (ref >90)
GFR calc non Af Amer: 82 mL/min — ABNORMAL LOW (ref >90)
Glucose, Bld: 95 mg/dL (ref 70–99)
Potassium: 3.5 mEq/L (ref 3.5–5.1)
Sodium: 127 mEq/L — ABNORMAL LOW (ref 135–145)

## 2012-03-21 LAB — MAGNESIUM: Magnesium: 1.9 mg/dL (ref 1.5–2.5)

## 2012-03-21 LAB — PHOSPHORUS: Phosphorus: 3.7 mg/dL (ref 2.3–4.6)

## 2012-03-21 MED ORDER — ALBUTEROL SULFATE (5 MG/ML) 0.5% IN NEBU
2.5000 mg | INHALATION_SOLUTION | RESPIRATORY_TRACT | Status: DC | PRN
  Administered 2012-03-22 – 2012-03-28 (×3): 2.5 mg via RESPIRATORY_TRACT
  Filled 2012-03-21 (×3): qty 0.5

## 2012-03-21 MED ORDER — LOSARTAN POTASSIUM 25 MG PO TABS
25.0000 mg | ORAL_TABLET | Freq: Two times a day (BID) | ORAL | Status: DC
  Administered 2012-03-21 – 2012-03-24 (×6): 25 mg via ORAL
  Filled 2012-03-21 (×7): qty 1

## 2012-03-21 MED ORDER — POTASSIUM CHLORIDE CRYS ER 20 MEQ PO TBCR
20.0000 meq | EXTENDED_RELEASE_TABLET | Freq: Every day | ORAL | Status: DC
  Administered 2012-03-22 – 2012-03-24 (×3): 20 meq via ORAL
  Filled 2012-03-21 (×4): qty 1

## 2012-03-21 MED ORDER — POTASSIUM CHLORIDE CRYS ER 20 MEQ PO TBCR
40.0000 meq | EXTENDED_RELEASE_TABLET | Freq: Once | ORAL | Status: AC
  Administered 2012-03-21: 40 meq via ORAL
  Filled 2012-03-21: qty 2

## 2012-03-21 NOTE — Progress Notes (Signed)
I contacted [facility]  Corine Shelter PA-C 03/21/2012 1:22 PM

## 2012-03-21 NOTE — Progress Notes (Signed)
Name: [name] [name] MRN: [id number] DOB: Apr 12, 1941    LOS: 5  Referring Provider:  EDP Reason for Referral:  Respiratory Failure     PULMONARY / CRITICAL CARE MEDICINE  HPI:  71 yo smoker admitted 03/16/2012 with progressive dyspnea x 2 weeks, acute encephalopathy, respiratory failure with VDRF.  Found to have STEMI, and had emergent cath 9/25.  Followed by Dr. Delton Coombes as outpt. PMHx HTN, CVA, PVD, Hyperlipidemia, COPD  PFT 10/13/11>>FEV1 2.55 (92%), FEV1% 60, TLC 7.11 (118%), DLCO 68%, no BD  Events Since Admission: 9/25 VDRF, STEMI, A fib 9/25 Cardiac cath>>mild/moderate left-sided disease without apparent hemodynamically significant lesion.  Subtotally, occluded distal RCA, with DES performed.  Medical management. 9/27 - extubated 9/28 - 2  X V tach yesterday post exubation - symptomatic. Needed extra amio bolus. Since then NSR. Eating breakfast. No overnight issues. Daughter at bedside. On 6L [place] o2 but no resp disterss 9/30 on tele floor, NAD. Subjective:  9/30 breathing bettwer   Vital Signs: Temp:  [97.7 F (36.5 C)-98.8 F (37.1 C)] 98.4 F (36.9 C) (09/30 0915) Pulse Rate:  [61-75] 75  (09/30 1024) Resp:  [14-19] 18  (09/30 0915) BP: (114-134)/(63-73) 114/63 mmHg (09/30 1024) SpO2:  [90 %-95 %] 95 % (09/30 0915) Weight:  [63.504 kg (140 lb)] 63.504 kg (140 lb) (09/30 0432)  Physical Examination: General - no distress HEENT - . PEERL +, no jvd Cardiac - s1,s2 irregular Chest - Rt [place] CVL site clean, no wheeze/rales+ rhonchi  Abd - soft, non-tender, +bowel sounds Ext - no edema but RT forearm mid aspect has 10 cm hematoma  Neuro -Alert, follows commands, moves all extremities.  Dg Chest Port 1 View  03/20/2012  *RADIOLOGY REPORT*  Clinical Data: Right subclavian line pulled back.  Evaluate line position.  PORTABLE CHEST - 1 VIEW  Comparison: 03/18/2012  Findings: Increased interstitial densities throughout both lungs are suggestive for pulmonary edema.  Heart  size is upper limits of normal.  The endotracheal tube and nasogastric tube have been removed.  Right subclavian central line tip is near the junction of the right innominate vein and SVC.  IMPRESSION: Diffusely increased interstitial lung densities.  Findings are suggestive for pulmonary edema.  Central line tip near the junction of the right innominate vein and SVC.   Original Report Authenticated By: Richarda Overlie, M.D.      ASSESSMENT AND PLAN  PULMONARY  Lab 03/17/12 0537 03/16/12 2324 03/16/12 1334 03/16/12 0950  PHART 7.439 7.546* 7.392 7.475*  PCO2ART 42.7 36.8 52.0* 50.6*  PO2ART 59.0* 66.0* 60.0* 106.0*  HCO3 29.0* 31.9* 31.6* 37.4*  O2SAT 91.0 95.0 90.0 98.0   Ventilator Settings:    ETT:  9/25>>>9/27 A:   Acute respiratory failure 2nd to STEMI, ? AECOPD, home narcotics/sedatives - on 03/19/12: s/p extubation 9/27. On 6L [place] but not in resp distress - on 03/20/12: down to 4L [place] -9/30 on 3 lm [place] Mild COPD based on PFT from 4/13  P:   -continue Incentive spirometry and diuresis since  03/20/12 -f/u CXR intermittently -will d/c scheduled BD's; he was not on BD's as an outpt, PFT with mild AFL in 4/13. Consider adding back spiriva prior to discharge. Will use albuterol prn for now -continue nicotine patch 7 mg daily -wean fio2 as tolerated  CARDIOVASCULAR  Lab 03/20/12 0419 03/17/12 2013 03/17/12 1625 03/17/12 0818 03/17/12 0040 03/16/12 2105 03/16/12 0939  TROPONINI -- >20.00* >20.00* >20.00* 0.73* 0.63* --  LATICACIDVEN -- -- -- -- -- -- --  PROBNP 7722.0* -- -- -- -- -- 2876.0*    Lines:   9/25 Rt [place] TLC >>>03/20/12 9/25 R radial a-line>>9/25  A:  STEMI.- EF 9/26 35% Hypotension likely from hypovolemia>>improved 9/27. A fib. Hx of HTN, Hyperlipidemia, PVD.  - on 03/19/12: 2 x V tach on 9/27 and amio gtt given Now in NSR. Likely volume overloaded  - 03/20/12: improved hypoxemia with daily lasix. BNP very high. Has hematoma rt forearm due to heparin infiltration  P:    - increased  daily lasix on 03/20/12 -norvasc, hydralazine, lopressor, losartan per cardiology -continue plavix, ASA -continue zocor -amiodarone po since 9/28  RENAL  Lab 03/21/12 0546 03/20/12 0418 03/19/12 0500 03/18/12 0457 03/17/12 0530  NA 127* 129* 129* 131* 126*  K 3.5 3.6 -- -- --  CL 88* 92* 91* 92* 86*  CO2 31 29 30 31 31   BUN 11 13 20  25* 18  CREATININE 0.96 0.96 1.03 1.20 0.94  CALCIUM 8.5 8.0* 8.1* 8.3* 8.6  MG 1.9 1.7 -- 2.2 2.0  PHOS 3.7 3.2 -- 4.3 3.9   Intake/Output      09/29 0701 - 09/30 0700 09/30 0701 - 10/01 0700   P.O. 480    I.V. (mL/kg) 150.1 (2.4)    IV Piggyback 10    Total Intake(mL/kg) 640.1 (10.1)    Urine (mL/kg/hr) 950 (0.6)    Total Output 950    Net -309.9         Urine Occurrence 1200 x     Foley:  9/25>>>  A:   Hyponatremia >> improved/stabe but poses risk for balance issues Hypomag - mild  P:   - MAg goal > 2; repleted on 03/20/12 K goal > 4 -f/u BMET -monitor urine outpt, electrolytes, renal fx  GASTROINTESTINAL  Lab 03/19/12 0500 03/18/12 0457 03/16/12 1906  AST 94* 257* 18  ALT 45 62* 10  ALKPHOS 58 56 65  BILITOT 0.2* 0.2* 0.3  PROT 5.8* 5.6* 6.5  ALBUMIN 2.7* 2.6* 3.0*    A:   Nutrition. Increased LFT's likely from hypotension.  - on 03/19/12: LFTs normal  P:   -heart healthy diet   HEMATOLOGIC  Lab 03/20/12 0418 03/19/12 0500 03/18/12 0457 03/17/12 0530 03/16/12 0937  HGB 10.4* 10.5* 10.9* 12.6* 15.2  HCT 30.5* 31.0* 32.0* 37.0* 43.9  PLT 242 253 255 294 330  INR -- -- -- -- --  APTT -- -- -- -- --   A:  Anemia>>likely hemodilutional  P:  - PRBC for hgb </= 8gm% in view of ACS exception is active bleeding with hemodynamic instability, then transfuse regardless of hemoglobin value.  At at all times try to transfuse 1 unit prbc as possible with exception of active hemorrhage    INFECTIOUS  Lab 03/20/12 0418 03/19/12 0500 03/18/12 0457 03/17/12 0530 03/16/12 0937  WBC 13.8* 15.9* 19.0* 21.4*  20.4*  PROCALCITON -- -- -- -- --   Cultures: 9/25 BCx2>>> 9/25 Sputum>>> 9/25 U. Strep>>>negative 9/25 U. Legion>>>negative    Antibiotics: Rocephin (AECOPD) 9/25>>>9/29 Zithromax (AECOPD) 9/25>>9/25 Doxycycline po (AECOPD) 9/29 >> (planned d/c 03/23/12) A:   Acute exacerbation of COPD  P:   -change to 2 more days of po doxy on 03/18/12  ENDOCRINE  Lab 03/18/12 0756 03/18/12 0455 03/17/12 2313 03/17/12 1953 03/17/12 1816  GLUCAP 90 106* 113* 114* 114*   A:   No acute issues  P:   -d/c CBG checks  NEUROLOGIC  A:   Encephalopathy>>resolved Hx of CVA P:   -  continue plavix - PT consult ordered  03/19/12   BEST PRACTICE / DISPOSITION Level of Care:  floor Primary Service: [facility] cardiology Consultants:  PCCM Code Status:  LCB, no CPR and no cardioversion. Diet: Heart healthy DVT Px: lmwh GI Px:  Protonix Skin Integrity:  Intact Social / Family:  Daughter  Updated at bedside 9/28. No family at bedside 03/20/12   [facility] Minor ACNP Adolph Pollack PCCM Pager [phone] till 3 pm If no answer page [phone] 03/21/2012, 11:25 AM   Levy Pupa, MD, PhD 03/21/2012, 3:32 PM [facility] [phone] or if no answer [phone]

## 2012-03-21 NOTE — Progress Notes (Signed)
CARDIAC REHAB PHASE I   PRE:  Rate/Rhythm: 78SR  BP:  Supine:   Sitting: 120/60  Standing:    SaO2: 4L 88-89%finger,100%forehead  MODE:  Ambulation: 250 ft   POST:  Rate/Rhythem: 84  BP:  Supine:   Sitting: 140/70  Standing:    SaO2: 4L 100%forehead,93%finger [id number] Waited for pt to get breathing treatment before walking.Pt's sats better when forehead probe used as compared to finger probe. Pt walked 250 ft on 4L with gait belt and asst x 2. Gait steady. DOE. Encouraged slow deep breaths walking,pt very talkative. Tolerated well except some wheezing. To recliner with daughter in room.Can be asst x 1.  Duanne Limerick

## 2012-03-21 NOTE — Progress Notes (Signed)
Physical Therapy Treatment Patient Details Name: [name] [name] MRN: [id number] DOB: 02-Jul-1940 Today's Date: 03/21/2012 Time: 0865-7846 PT Time Calculation (min): 26 min  PT Assessment / Plan / Recommendation Comments on Treatment Session  Pt admitted with STEMI and VDRF.  Pt progressing well able to increase ambulation distance and independence.  Motivated.    Follow Up Recommendations  No PT follow up;Supervision - Intermittent    Barriers to Discharge        Equipment Recommendations  None recommended by PT    Recommendations for Other Services    Frequency Min 3X/week   Plan Discharge plan remains appropriate;Frequency remains appropriate    Precautions / Restrictions Precautions Precautions: None Restrictions Weight Bearing Restrictions: No   Pertinent Vitals/Pain None    Mobility  Bed Mobility Bed Mobility: Not assessed Transfers Transfers: Sit to Stand;Stand to Sit Sit to Stand: 5: Supervision;With upper extremity assist;From chair/3-in-1 Stand to Sit: 5: Supervision;With upper extremity assist;To chair/3-in-1 Details for Transfer Assistance: Verbal cues for safest hand placement. Ambulation/Gait Ambulation/Gait Assistance: 4: Min assist (Up to min assist with progress to min (guard).) Ambulation Distance (Feet): 500 Feet Assistive device: None Ambulation/Gait Assistance Details: Pt able to ambulate with min (guard) mostly requiring guarding for balance especially with fatigue.  LOB x2 trials with turns requiring min assist to correct.   Gait Pattern: Within Functional Limits Stairs: No Wheelchair Mobility Wheelchair Mobility: No    Exercises General Exercises - Lower Extremity Long Arc Quad: AROM;Strengthening;Both;10 reps;Seated Hip ABduction/ADduction: AROM;Strengthening;Both;10 reps;Seated (Isometric Adduction performed.) Straight Leg Raises: AROM;Strengthening;Both;10 reps;Supine Hip Flexion/Marching: AROM;Strengthening;Both;10 reps;Seated   PT  Diagnosis:    PT Problem List:   PT Treatment Interventions:     PT Goals Acute Rehab PT Goals PT Goal Formulation: With patient Time For Goal Achievement: 03/27/12 Potential to Achieve Goals: Good PT Goal: Sit to Stand - Progress: Progressing toward goal PT Goal: Stand to Sit - Progress: Progressing toward goal PT Goal: Ambulate - Progress: Progressing toward goal PT Goal: Perform Home Exercise Program - Progress: Progressing toward goal  Visit Information  Last PT Received On: 03/21/12 Assistance Needed: +1    Subjective Data  Subjective: "I am doing much better today." Patient Stated Goal: Get strong.   Cognition  Overall Cognitive Status: Appears within functional limits for tasks assessed/performed Arousal/Alertness: Awake/alert Orientation Level: Appears intact for tasks assessed Behavior During Session: [facility] for tasks performed    Balance  Balance Balance Assessed: No  End of Session PT - End of Session Equipment Utilized During Treatment: Gait belt;Oxygen Activity Tolerance: Patient tolerated treatment well Patient left: in chair;with call bell/phone within reach;with family/visitor present Nurse Communication: Mobility status   GP     Cephus Shelling 03/21/2012, 11:30 AM  03/21/2012 Cephus Shelling, PT, DPT [phone]

## 2012-03-21 NOTE — Plan of Care (Signed)
Problem: Phase III Progression Outcomes Goal: ACE I or ARB if EF < 40% Outcome: Completed/Met Date Met:  03/21/12 On Losaartan

## 2012-03-21 NOTE — Progress Notes (Signed)
Subjective:  SOB when flat in bed, OK when sitting up.  Objective:  Vital Signs in the last 24 hours: Temp:  [97.7 F (36.5 C)-98.8 F (37.1 C)] 98.4 F (36.9 C) (09/30 0915) Pulse Rate:  [61-75] 75  (09/30 1024) Resp:  [14-19] 18  (09/30 0915) BP: (114-134)/(63-73) 114/63 mmHg (09/30 1024) SpO2:  [90 %-95 %] 95 % (09/30 0915) Weight:  [63.504 kg (140 lb)] 63.504 kg (140 lb) (09/30 0432)  Intake/Output from previous day:  Intake/Output Summary (Last 24 hours) at 03/21/12 1058 Last data filed at 03/20/12 1300  Gross per 24 hour  Intake    250 ml  Output    700 ml  Net   -450 ml      . albuterol  2.5 mg Nebulization TID   And  . ipratropium  0.5 mg Nebulization TID  . amiodarone  200 mg Oral BID  . amLODipine  5 mg Oral Daily  . antiseptic oral rinse  15 mL Mouth Rinse BID  . aspirin  81 mg Oral Daily  . atorvastatin  20 mg Oral q1800  . clopidogrel  75 mg Oral Q breakfast  . doxycycline  100 mg Oral Q12H  . enoxaparin (LOVENOX) injection  40 mg Subcutaneous Q24H  . furosemide  40 mg Intravenous Daily  . gabapentin  600 mg Oral BID  . losartan  25 mg Oral Daily  . magnesium sulfate 1 - 4 g bolus IVPB  2 g Intravenous Once  . metoprolol tartrate  12.5 mg Oral TID  . nicotine  7 mg Transdermal Q24H  . nitroGLYCERIN  0.2 mg Transdermal Daily  . pantoprazole  40 mg Oral Q1200  . potassium chloride  20 mEq Oral Daily  . potassium chloride  40 mEq Oral Once  . potassium phosphate (monobasic)  500 mg Oral BID  . traZODone  75 mg Oral QHS  . DISCONTD: albuterol  2.5 mg Nebulization Q6H  . DISCONTD: albuterol  2.5 mg Nebulization Q6H  . DISCONTD: albuterol  2.5 mg Nebulization TID  . DISCONTD: ipratropium  0.5 mg Nebulization Q6H  . DISCONTD: ipratropium  0.5 mg Nebulization TID  . DISCONTD: ipratropium  0.5 mg Nebulization TID    Physical Exam: General appearance: alert, cooperative and no distress Lungs: decreased breath sounds Lt base Heart: regular rate and  rhythm Extrem: no edema   Rate: 65  Rhythm: normal sinus rhythm  Lab Results:  Basename 03/20/12 0418 03/19/12 0500  WBC 13.8* 15.9*  HGB 10.4* 10.5*  PLT 242 253    Basename 03/21/12 0546 03/20/12 0418  NA 127* 129*  K 3.5 3.6  CL 88* 92*  CO2 31 29  GLUCOSE 95 106*  BUN 11 13  CREATININE 0.96 0.96   No results found for this basename: TROPONINI:2,CK,MB:2 in the last 72 hours Hepatic Function Panel  Basename 03/19/12 0500  PROT 5.8*  ALBUMIN 2.7*  AST 94*  ALT 45  ALKPHOS 58  BILITOT 0.2*  BILIDIR --  IBILI --   No results found for this basename: CHOL in the last 72 hours No results found for this basename: INR in the last 72 hours  Imaging: Imaging results have been reviewed  Cardiac Studies:  Assessment/Plan:   Principal Problem:  *STEMI, unsuccessful RCA PCI 03/17/12 Active Problems:  Acute respiratory failure  Acute pulmonary edema  COPD with respiratory failure, acute  PAF, post MI  NSVT, post MI  Ischemic cardiomyopathy, EF 30-35% by echo  Tobacco abuse  PVD,  history of Lt Ax-fem and fem-fem BPG in past, Dr Manson Passey follows  HTN (hypertension)  Dyslipidemia  History of CVA (cerebrovascular accident)   Plan- CXR on the 29th C/W CHF. He is diuresing on lasix, will continue for another day or two. Check BNP in am. He may need a LifeVest at discharge, will discuss with MD. He is not on Beta Blocker, HR 60-70 and he is on Amiodarone. Possibly home 24-48hrs. K+ added.   Corine Shelter PA-C 03/21/2012, 10:58 AM    Patient seen and examined. Agree with assessment and plan. Breathing better, no chest pain. Titrate losartan to 25 mg bid and eventually to 50 mg bid for more optimal neurohumoral inhibition as BP allows. Also consider low dose cardioselective beta blocker Rx.   Lennette Bihari, MD, [facility] 03/21/2012 11:40 AM

## 2012-03-22 DIAGNOSIS — J810 Acute pulmonary edema: Secondary | ICD-10-CM

## 2012-03-22 LAB — CULTURE, BLOOD (ROUTINE X 2): Culture: NO GROWTH

## 2012-03-22 LAB — BASIC METABOLIC PANEL
BUN: 11 mg/dL (ref 6–23)
CO2: 29 mEq/L (ref 19–32)
Calcium: 8.6 mg/dL (ref 8.4–10.5)
Chloride: 91 mEq/L — ABNORMAL LOW (ref 96–112)
Creatinine, Ser: 0.95 mg/dL (ref 0.50–1.35)
GFR calc Af Amer: >90 mL/min (ref >90)
GFR calc non Af Amer: 82 mL/min — ABNORMAL LOW (ref >90)
Glucose, Bld: 99 mg/dL (ref 70–99)
Potassium: 3.7 mEq/L (ref 3.5–5.1)
Sodium: 130 mEq/L — ABNORMAL LOW (ref 135–145)

## 2012-03-22 LAB — PRO B NATRIURETIC PEPTIDE: Pro B Natriuretic peptide (BNP): 6485 pg/mL — ABNORMAL HIGH (ref 0–125)

## 2012-03-22 NOTE — Progress Notes (Signed)
Nutrition Follow-up  Intervention:    No nutrition intervention warranted at this time RD to follow for nutrition care plan  Assessment:   Patient extubated 9/27. States his appetite is good. PO intake 75-100% per flowsheet records.  Diet Order:  Heart Healthy  Meds: Scheduled Meds:   . amiodarone  200 mg Oral BID  . amLODipine  5 mg Oral Daily  . antiseptic oral rinse  15 mL Mouth Rinse BID  . aspirin  81 mg Oral Daily  . atorvastatin  20 mg Oral q1800  . clopidogrel  75 mg Oral Q breakfast  . doxycycline  100 mg Oral Q12H  . enoxaparin (LOVENOX) injection  40 mg Subcutaneous Q24H  . furosemide  40 mg Intravenous Daily  . gabapentin  600 mg Oral BID  . losartan  25 mg Oral BID  . metoprolol tartrate  12.5 mg Oral TID  . nicotine  7 mg Transdermal Q24H  . nitroGLYCERIN  0.2 mg Transdermal Daily  . pantoprazole  40 mg Oral Q1200  . potassium chloride  20 mEq Oral Daily  . potassium chloride  40 mEq Oral Once  . potassium phosphate (monobasic)  500 mg Oral BID  . traZODone  75 mg Oral QHS  . DISCONTD: albuterol  2.5 mg Nebulization TID  . DISCONTD: ipratropium  0.5 mg Nebulization TID  . DISCONTD: losartan  25 mg Oral Daily   Continuous Infusions:   . sodium chloride 10 mL/hr at 03/20/12 0800   PRN Meds:.sodium chloride, acetaminophen, albuterol, fentaNYL, ondansetron (ZOFRAN) IV, DISCONTD: albuterol  Labs:  CMP     Component Value Date/Time   NA 130* 03/22/2012 0535   K 3.7 03/22/2012 0535   CL 91* 03/22/2012 0535   CO2 29 03/22/2012 0535   GLUCOSE 99 03/22/2012 0535   BUN 11 03/22/2012 0535   CREATININE 0.95 03/22/2012 0535   CALCIUM 8.6 03/22/2012 0535   PROT 5.8* 03/19/2012 0500   ALBUMIN 2.7* 03/19/2012 0500   AST 94* 03/19/2012 0500   ALT 45 03/19/2012 0500   ALKPHOS 58 03/19/2012 0500   BILITOT 0.2* 03/19/2012 0500   GFRNONAA 82* 03/22/2012 0535   GFRAA >90 03/22/2012 0535     Intake/Output Summary (Last 24 hours) at 03/22/12 1009 Last data filed at 03/22/12  0200  Gross per 24 hour  Intake    240 ml  Output   1100 ml  Net   -860 ml    Weight Status:  64.1 kg (10/1) -- stable  Re-estimated needs:  1700-1900 kcals, 90-105 gm protein  Nutrition Dx:  Inadequate Oral Intake, resolved  Goal:  Oral intake with meals to meet >/= 90% of estimated nutrition needs, met  Monitor:  PO intake, weight, labs, I/O's  Kirkland Hun, RD, LDN Pager #: [phone] After-Hours Pager #: [phone]

## 2012-03-22 NOTE — Progress Notes (Signed)
Patient sitting in chair. Patient voided urine after receiving IV lasix. "I feel so much better; I can breathe better." Patient on [facility]. Will continue to monitor. [name] [name]

## 2012-03-22 NOTE — Progress Notes (Signed)
Name: [name] [name] MRN: [id number] DOB: 02-Mar-1941    LOS: 6  Referring Provider:  EDP Reason for Referral:  Respiratory Failure     PULMONARY / CRITICAL CARE MEDICINE  HPI:  71 yo smoker admitted 03/16/2012 with progressive dyspnea x 2 weeks, acute encephalopathy, respiratory failure with VDRF.  Found to have STEMI, and had emergent cath 9/25.  Followed by Dr. Delton Coombes as outpt. PMHx HTN, CVA, PVD, Hyperlipidemia, COPD  PFT 10/13/11>>FEV1 2.55 (92%), FEV1% 60, TLC 7.11 (118%), DLCO 68%, no BD  Events Since Admission: 9/25 VDRF, STEMI, A fib 9/25 Cardiac cath>>mild/moderate left-sided disease without apparent hemodynamically significant lesion.  Subtotally, occluded distal RCA, with DES performed.  Medical management. 9/27 - extubated 9/28 - 2  X V tach yesterday post exubation - symptomatic. Needed extra amio bolus. Since then NSR. Eating breakfast. No overnight issues. Daughter at bedside. On 6L [place] o2 but no resp disterss 9/30 on tele floor, NAD.  Subjective: Improved, but has had to sleep upright in a chair  Vital Signs: Temp:  [97.8 F (36.6 C)-98.8 F (37.1 C)] 98 F (36.7 C) (10/01 0552) Pulse Rate:  [66-75] 66  (10/01 0552) Resp:  [18] 18  (10/01 0552) BP: (97-123)/(59-77) 123/62 mmHg (10/01 0552) SpO2:  [92 %-95 %] 93 % (10/01 0552) Weight:  [64.1 kg (141 lb 5 oz)] 64.1 kg (141 lb 5 oz) (10/01 0552)  Physical Examination: General - no distress HEENT - . PEERL +, no jvd Cardiac - s1,s2 irregular Chest - Rt [place] CVL site clean, no wheeze/rales+ rhonchi  Abd - soft, non-tender, +bowel sounds Ext - no edema but RT forearm mid aspect has 10 cm hematoma  Neuro -Alert, follows commands, moves all extremities.  No results found.   ASSESSMENT AND PLAN  PULMONARY  Lab 03/17/12 0537 03/16/12 2324 03/16/12 1334 03/16/12 0950  PHART 7.439 7.546* 7.392 7.475*  PCO2ART 42.7 36.8 52.0* 50.6*  PO2ART 59.0* 66.0* 60.0* 106.0*  HCO3 29.0* 31.9* 31.6* 37.4*  O2SAT 91.0  95.0 90.0 98.0   Ventilator Settings:    ETT:  9/25>>>9/27 A:   Acute respiratory failure 2nd to STEMI, ? AECOPD, home narcotics/sedatives Mild COPD based on PFT from 4/13 Chronic cough, contributors unclear, suspect PND, GERD. Consider possible contribution of his ARB, irritation from ETT  P:   -continue Incentive spirometry and diuresis since  03/20/12 -f/u CXR intermittently -will d/c scheduled BD's; he was not on BD's as an outpt, PFT with mild AFL in 4/13. Consider adding back spiriva prior to discharge. Will use albuterol prn for now -continue nicotine patch 7 mg daily >> would consider d/c -wean fio2 as tolerated  CARDIOVASCULAR  Lab 03/22/12 0535 03/20/12 0419 03/17/12 2013 03/17/12 1625 03/17/12 0818 03/17/12 0040 03/16/12 2105 03/16/12 0939  TROPONINI -- -- >20.00* >20.00* >20.00* 0.73* 0.63* --  LATICACIDVEN -- -- -- -- -- -- -- --  PROBNP 6485.0* 7722.0* -- -- -- -- -- 2876.0*    Lines:   9/25 Rt [place] TLC >>>03/20/12 9/25 R radial a-line>>9/25  A:  STEMI.- EF 9/26 35% Hypotension likely from hypovolemia>>resolved A fib. Hx of HTN, Hyperlipidemia, PVD.  P:  -increased  daily lasix on 03/20/12 -norvasc, hydralazine, lopressor, losartan per cardiology -continue plavix, ASA -continue zocor -amiodarone po since 9/28  RENAL  Lab 03/22/12 0535 03/21/12 0546 03/20/12 0418 03/19/12 0500 03/18/12 0457 03/17/12 0530  NA 130* 127* 129* 129* 131* --  K 3.7 3.5 -- -- -- --  CL 91* 88* 92* 91* 92* --  CO2 29 31 29 30 31  --  BUN 11 11 13 20  25* --  CREATININE 0.95 0.96 0.96 1.03 1.20 --  CALCIUM 8.6 8.5 8.0* 8.1* 8.3* --  MG -- 1.9 1.7 -- 2.2 2.0  PHOS -- 3.7 3.2 -- 4.3 3.9   Intake/Output      09/30 0701 - 10/01 0700 10/01 0701 - 10/02 0700   P.O. 240    I.V. (mL/kg)     IV Piggyback     Total Intake(mL/kg) 240 (3.7)    Urine (mL/kg/hr) 1600 (1)    Total Output 1600    Net -1360          Foley:  9/25>>>  A:   Hyponatremia >> improved/stabe but poses risk  for balance issues Hypomag - mild  P:   - MAg goal > 2; repleted on 03/20/12 K goal > 4 -f/u BMET -monitor urine outpt, electrolytes, renal fx  GASTROINTESTINAL  Lab 03/19/12 0500 03/18/12 0457 03/16/12 1906  AST 94* 257* 18  ALT 45 62* 10  ALKPHOS 58 56 65  BILITOT 0.2* 0.2* 0.3  PROT 5.8* 5.6* 6.5  ALBUMIN 2.7* 2.6* 3.0*    A:   Nutrition. Increased LFT's likely from hypotension.  - on 03/19/12: LFTs normal  P:   -heart healthy diet   HEMATOLOGIC  Lab 03/20/12 0418 03/19/12 0500 03/18/12 0457 03/17/12 0530 03/16/12 0937  HGB 10.4* 10.5* 10.9* 12.6* 15.2  HCT 30.5* 31.0* 32.0* 37.0* 43.9  PLT 242 253 255 294 330  INR -- -- -- -- --  APTT -- -- -- -- --   A:  Anemia>>likely hemodilutional  P:  - PRBC for hgb </= 8gm% in view of ACS exception is active bleeding with hemodynamic instability, then transfuse regardless of hemoglobin value.  At at all times try to transfuse 1 unit prbc as possible with exception of active hemorrhage    INFECTIOUS  Lab 03/20/12 0418 03/19/12 0500 03/18/12 0457 03/17/12 0530 03/16/12 0937  WBC 13.8* 15.9* 19.0* 21.4* 20.4*  PROCALCITON -- -- -- -- --   Cultures: 9/25 BCx2>>>neg 9/25 Sputum>>> 9/25 U. Strep>>>negative 9/25 U. Legion>>>negative  Antibiotics: Rocephin (AECOPD) 9/25>>>9/29 Zithromax (AECOPD) 9/25>>9/25 Doxycycline po (AECOPD) 9/29 >> 10/1 A:   Acute exacerbation of COPD  P:   - abx completed  ENDOCRINE  Lab 03/18/12 0756 03/18/12 0455 03/17/12 2313 03/17/12 1953 03/17/12 1816  GLUCAP 90 106* 113* 114* 114*   A:   No acute issues  P:   -d/c CBG checks  NEUROLOGIC  A:   Encephalopathy>>resolved Hx of CVA P:   -continue plavix - PT consult ordered  03/19/12   BEST PRACTICE / DISPOSITION Level of Care:  floor Primary Service: [facility] cardiology Consultants:  PCCM Code Status:  LCB, no CPR and no cardioversion. Diet: Heart healthy DVT Px: lmwh GI Px:  Protonix Skin Integrity:  Intact Social  / Family:  None at bedside   [facility] Minor ACNP Adolph Pollack PCCM Pager [phone] till 3 pm If no answer page [phone] 03/22/2012, 9:03 AM  Levy Pupa, MD, PhD 03/22/2012, 11:08 AM [facility] [phone] or if no answer [phone]

## 2012-03-22 NOTE — Progress Notes (Signed)
Subjective:  No CP/SOB  Objective:  Temp:  [97.8 F (36.6 C)-98.8 F (37.1 C)] 98 F (36.7 C) (10/01 0552) Pulse Rate:  [66-75] 66  (10/01 0552) Resp:  [18] 18  (10/01 0552) BP: (97-123)/(59-77) 123/62 mmHg (10/01 0552) SpO2:  [90 %-95 %] 93 % (10/01 0552) Weight:  [64.1 kg (141 lb 5 oz)] 64.1 kg (141 lb 5 oz) (10/01 0552) Weight change: 0.596 kg (1 lb 5 oz)  Intake/Output from previous day: 09/30 0701 - 10/01 0700 In: 240 [P.O.:240] Out: 1600 [Urine:1600]  Intake/Output from this shift:    Physical Exam: General appearance: alert, cooperative, appears stated age and no distress Neck: no adenopathy, no carotid bruit, no JVD, supple, symmetrical, trachea midline and thyroid not enlarged, symmetric, no tenderness/mass/nodules Lungs: clear to auscultation bilaterally Heart: regular rate and rhythm, S1, S2 normal, no murmur, click, rub or gallop Extremities: extremities normal, atraumatic, no cyanosis or edema  Lab Results: Results for orders placed during the hospital encounter of 03/16/12 (from the past 48 hour(s))  BASIC METABOLIC PANEL     Status: Abnormal   Collection Time   03/21/12  5:46 AM      Component Value Range Comment   Sodium 127 (*) 135 - 145 mEq/L    Potassium 3.5  3.5 - 5.1 mEq/L    Chloride 88 (*) 96 - 112 mEq/L    CO2 31  19 - 32 mEq/L    Glucose, Bld 95  70 - 99 mg/dL    BUN 11  6 - 23 mg/dL    Creatinine, Ser 1.61  0.50 - 1.35 mg/dL    Calcium 8.5  8.4 - 09.6 mg/dL    GFR calc non Af Amer 82 (*) >90 mL/min    GFR calc Af Amer >90  >90 mL/min   PHOSPHORUS     Status: Normal   Collection Time   03/21/12  5:46 AM      Component Value Range Comment   Phosphorus 3.7  2.3 - 4.6 mg/dL   MAGNESIUM     Status: Normal   Collection Time   03/21/12  5:46 AM      Component Value Range Comment   Magnesium 1.9  1.5 - 2.5 mg/dL   BASIC METABOLIC PANEL     Status: Abnormal   Collection Time   03/22/12  5:35 AM      Component Value Range Comment   Sodium 130  (*) 135 - 145 mEq/L    Potassium 3.7  3.5 - 5.1 mEq/L    Chloride 91 (*) 96 - 112 mEq/L    CO2 29  19 - 32 mEq/L    Glucose, Bld 99  70 - 99 mg/dL    BUN 11  6 - 23 mg/dL    Creatinine, Ser 0.45  0.50 - 1.35 mg/dL    Calcium 8.6  8.4 - 40.9 mg/dL    GFR calc non Af Amer 82 (*) >90 mL/min    GFR calc Af Amer >90  >90 mL/min   PRO B NATRIURETIC PEPTIDE     Status: Abnormal   Collection Time   03/22/12  5:35 AM      Component Value Range Comment   Pro B Natriuretic peptide (BNP) 6485.0 (*) 0 - 125 pg/mL     Imaging: Imaging results have been reviewed  Assessment/Plan:   1. Principal Problem: 2.  *STEMI, unsuccessful RCA PCI 03/17/12 3. Active Problems: 4.  Tobacco abuse 5.  Acute respiratory failure 6.  Acute pulmonary edema 7.  COPD with respiratory failure, acute 8.  PVD, history of Lt Ax-fem and fem-fem BPG in past, Dr Manson Passey follows 9.  HTN (hypertension) 10.  Dyslipidemia 11.  History of CVA (cerebrovascular accident) 69.  PAF, post MI 75.  NSVT, post MI 14.  Ischemic cardiomyopathy, EF 30-35% by echo 15.   Time Spent Directly with Patient:  20 minutes  Length of Stay:  LOS: 6 days   S/P inf STEMI with unsuccessful intervention now with moderately severe LV dysfunction. He had a lot of NSVT in the peri infarct period, now NSR on po Amio. CXR c/w CHF and BNP increasing. IV lasix added. Ambulating with PT. Agree with low dose cardio selective BB, continue to diurese, follow BNP/CXR. Agree with LifeVest.  Runell Gess 03/22/2012, 7:50 AM

## 2012-03-22 NOTE — Progress Notes (Signed)
CARDIAC REHAB PHASE I   PRE:  Rate/Rhythm: 72 SR  BP:  Supine:   Sitting: 148/70  Standing:    SaO2: 94 4L 92 2L  MODE:  Ambulation: 550 ft   POST:  Rate/Rhythem: 85  BP:  Supine:   Sitting: 132/70  Standing:    SaO2: 852L increased O2 to 3L sat 95 after walk 93% on 3L 1030-1125 On arrrival pt on O2 4L sat 94%. O2 decresed to 2L sat 92%. Pt ambulated on 2L sat after 100 feet 91%, after 400 feet sat 85%. Had pt to do standing rest stop ans increased O2 to 3L. When O2 sat down pt c/o of SOB. With rest and increase of O2 sat improved to 95%. On return to room pt sat 93% on 3L. Pt back to recliner after walk with call light in reach. Resting O2 left at 2L sat 93%. Reported sats  to RN.  Beatrix Fetters

## 2012-03-23 MED ORDER — ISOSORBIDE MONONITRATE ER 30 MG PO TB24
30.0000 mg | ORAL_TABLET | Freq: Every day | ORAL | Status: DC
  Administered 2012-03-23 – 2012-03-25 (×3): 30 mg via ORAL
  Filled 2012-03-23 (×3): qty 1

## 2012-03-23 MED ORDER — FUROSEMIDE 10 MG/ML IJ SOLN
80.0000 mg | Freq: Once | INTRAMUSCULAR | Status: AC
  Administered 2012-03-23: 80 mg via INTRAVENOUS
  Filled 2012-03-23: qty 4

## 2012-03-23 NOTE — Progress Notes (Signed)
ANTICOAGULATION CONSULT NOTE - Follow Up Consult  Pharmacy Consult for heparin --> Lovenox Indication:  VTE prophylaxis  Allergies  Allergen Reactions  . Ceclor (Cefaclor) Other (See Comments)    Reaction unknown    Patient Measurements: Height: 5\' 6"  (167.6 cm) Weight: 140 lb 4.8 oz (63.64 kg) IBW/kg (Calculated) : 63.8  Heparin Dosing Weight: 62.2kg  Vital Signs: Temp: 98.4 F (36.9 C) (10/02 1351) Temp src: Oral (10/02 1351) BP: 99/55 mmHg (10/02 1351) Pulse Rate: 71  (10/02 1351)  Labs:  Basename 03/22/12 0535 03/21/12 0546  HGB -- --  HCT -- --  PLT -- --  APTT -- --  LABPROT -- --  INR -- --  HEPARINUNFRC -- --  CREATININE 0.95 0.96  CKTOTAL -- --  CKMB -- --  TROPONINI -- --    Estimated Creatinine Clearance: 65.1 ml/min (by C-G formula based on Cr of 0.95).  Assessment: 71 yo male on Lovenox 40mg  sq q24h for VTE prophylaxis. Weight 63.6 kg. Scr 0.95 CrCl ~ 65 ml/min. Renal function stable. PLTC 242K on 9/29. No bleeding noted.      Plan:  No change in Lovenox 40 mg SQ daily.  Pharmacy will sign off. Please re-consult pharmacist if needed.    Noah Delaine, RPh Clinical Pharmacist Pager: [phone] 03/23/2012 3:19 PM

## 2012-03-23 NOTE — Progress Notes (Signed)
Pt. Seen and examined. Agree with the NP/PA-C note as written.  Will give additional lasix today to see if we can get him off of oxygen. Working on [employer] placement. Hopeful d/c home tomorrow, however, BNP remains elevated at 6485 and he may need a day or two of additional diuresis.   Chrystie Nose, MD, [facility] Attending Cardiologist [facility]

## 2012-03-23 NOTE — Progress Notes (Signed)
Physical Therapy Treatment Patient Details Name: [name] [name] MRN: [id number] DOB: February 14, 1941 Today's Date: 03/23/2012 Time: 4098-1191 PT Time Calculation (min): 30 min  PT Assessment / Plan / Recommendation Comments on Treatment Session  Pt admitted with STEMI and VDRF. Pt progressing well with therapy and continues to increase ambulation distance/independence. Motivated.    Follow Up Recommendations  No PT follow up;Supervision - Intermittent    Barriers to Discharge        Equipment Recommendations  None recommended by PT    Recommendations for Other Services    Frequency Min 3X/week   Plan Discharge plan remains appropriate;Frequency remains appropriate    Precautions / Restrictions Precautions Precautions: None Restrictions Weight Bearing Restrictions: No   Pertinent Vitals/Pain None    Mobility  Bed Mobility Bed Mobility: Not assessed Transfers Transfers: Sit to Stand;Stand to Sit Sit to Stand: 6: Modified independent (Device/Increase time);With upper extremity assist;From chair/3-in-1 Stand to Sit: 6: Modified independent (Device/Increase time);With upper extremity assist;To chair/3-in-1 Ambulation/Gait Ambulation/Gait Assistance: 4: Min guard Ambulation Distance (Feet): 700 Feet Assistive device: None Ambulation/Gait Assistance Details: Guarding for balance only with no LOB with gait.  Cues to slow velocity in order to slow DOE from 2/4 to 0/4 with gait. Gait Pattern: Within Functional Limits Stairs: No Wheelchair Mobility Wheelchair Mobility: No    Exercises General Exercises - Lower Extremity Long Arc Quad: AROM;Strengthening;Both;10 reps;Seated Hip ABduction/ADduction: AROM;Strengthening;Both;10 reps;Seated Straight Leg Raises: AROM;Strengthening;Both;10 reps;Supine Hip Flexion/Marching: AROM;Strengthening;Both;10 reps;Seated   PT Diagnosis:    PT Problem List:   PT Treatment Interventions:     PT Goals Acute Rehab PT Goals PT Goal Formulation:  With patient Time For Goal Achievement: 03/27/12 Potential to Achieve Goals: Good PT Goal: Sit to Stand - Progress: Met PT Goal: Stand to Sit - Progress: Met PT Goal: Ambulate - Progress: Progressing toward goal PT Goal: Perform Home Exercise Program - Progress: Progressing toward goal  Visit Information  Last PT Received On: 03/23/12 Assistance Needed: +1    Subjective Data  Subjective: "I feel much better today." Patient Stated Goal: Get strong.   Cognition  Overall Cognitive Status: Appears within functional limits for tasks assessed/performed Arousal/Alertness: Awake/alert Orientation Level: Appears intact for tasks assessed Behavior During Session: [facility] for tasks performed    Balance  Balance Balance Assessed: No  End of Session PT - End of Session Equipment Utilized During Treatment: Gait belt;Oxygen Activity Tolerance: Patient tolerated treatment well Patient left: in chair;with call bell/phone within reach Nurse Communication: Mobility status   GP     Cephus Shelling 03/23/2012, 8:10 AM  03/23/2012 Cephus Shelling, PT, DPT [phone]

## 2012-03-23 NOTE — Progress Notes (Signed)
CARDIAC REHAB PHASE I   PRE:  Rate/Rhythm: 74 SR  BP:  Supine:   Sitting: 106/60  Standing:    SaO2: 941/2L 91 RA  MODE:  Ambulation: 890 ft   POST:  Rate/Rhythem: 74  BP:  Supine:   Sitting: 104/50  Standing:    SaO2: 87 RA 92 2Lboth during walk after walk 93 2L 1405-1530 On arrival pt on O2 1/2 L sat 91 on RA. Pt assisted X 1 to ambulate, gait steady. Checked RA sat after 200 feet 87%. Pt c/o of DOE. O2 applied 2L sat with O2 and rest improved to 92%. Walked on 2L sat at end of walk 93%. Compeletd MI, CHF,smoking cessation education with pt. Pt given tips for quitting and contact numbers for coaching lines. He voices understanding. Pt agrees to Outpt. CRP in [place], will send referral.  Beatrix Fetters

## 2012-03-23 NOTE — Progress Notes (Signed)
Name: [name] [name] MRN: [id number] DOB: 1940-09-04    LOS: 7  Referring Provider:  EDP Reason for Referral:  Respiratory Failure     PULMONARY / CRITICAL CARE MEDICINE  HPI:  71 yo smoker admitted 03/16/2012 with progressive dyspnea x 2 weeks, acute encephalopathy, respiratory failure with VDRF.  Found to have STEMI, and had emergent cath 9/25.  Followed by Dr. Delton Coombes as outpt. PMHx HTN, CVA, PVD, Hyperlipidemia, COPD  PFT 10/13/11>>FEV1 2.55 (92%), FEV1% 60, TLC 7.11 (118%), DLCO 68%, no BD  Events Since Admission: 9/25 VDRF, STEMI, A fib 9/25 Cardiac cath>>mild/moderate left-sided disease without apparent hemodynamically significant lesion.  Subtotally, occluded distal RCA, with DES performed.  Medical management. 9/27 - extubated 9/28 - 2  X V tach yesterday post exubation - symptomatic. Needed extra amio bolus. Since then NSR. Eating breakfast. No overnight issues. Daughter at bedside. On 6L [place] o2 but no resp disterss 9/30 on tele floor, NAD.  Subjective: Improved, but has had to sleep upright in a chair  Vital Signs: Temp:  [97.4 F (36.3 C)-98 F (36.7 C)] 97.4 F (36.3 C) (10/02 0503) Pulse Rate:  [58-76] 71  (10/02 1018) Resp:  [16-18] 18  (10/02 0503) BP: (97-129)/(57-75) 121/75 mmHg (10/02 1018) SpO2:  [90 %-92 %] 90 % (10/02 0503) Weight:  [63.64 kg (140 lb 4.8 oz)] 63.64 kg (140 lb 4.8 oz) (10/02 0503)  Physical Examination: General - no distress HEENT - . PEERL +, no jvd Cardiac - s1,s2 irregular Chest - Rt [place] CVL site clean, no wheeze/rales+ rhonchi  Abd - soft, non-tender, +bowel sounds Ext - no edema but RT forearm mid aspect has 10 cm hematoma  Neuro -Alert, follows commands, moves all extremities.  No results found.   ASSESSMENT AND PLAN  PULMONARY  Lab 03/17/12 0537 03/16/12 2324 03/16/12 1334  PHART 7.439 7.546* 7.392  PCO2ART 42.7 36.8 52.0*  PO2ART 59.0* 66.0* 60.0*  HCO3 29.0* 31.9* 31.6*  O2SAT 91.0 95.0 90.0   Ventilator  Settings:    ETT:  9/25>>>9/27 A:   Acute respiratory failure 2nd to STEMI, ? AECOPD, home narcotics/sedatives Mild COPD based on PFT from 4/13 Chronic cough, contributors unclear, suspect PND, GERD. Consider possible contribution of his ARB, irritation from ETT  P:   -continue Incentive spirometry and diuresis since  03/20/12 -f/u CXR intermittently -will d/c scheduled BD's; he was not on BD's as an outpt, PFT with mild AFL in 4/13. Consider adding back spiriva prior to discharge. Will use albuterol prn for now -continue nicotine patch 7 mg daily >> would consider d/c -wean fio2 as tolerated -needs home O2 at 3 l/m. Desats on 2 l [place] with ambulation.  CARDIOVASCULAR  Lab 03/22/12 0535 03/20/12 0419 03/17/12 2013 03/17/12 1625 03/17/12 0818 03/17/12 0040 03/16/12 2105  TROPONINI -- -- >20.00* >20.00* >20.00* 0.73* 0.63*  LATICACIDVEN -- -- -- -- -- -- --  PROBNP 6485.0* 7722.0* -- -- -- -- --    Lines:   9/25 Rt [place] TLC >>>03/20/12 9/25 R radial a-line>>9/25  A:  STEMI.- EF 9/26 35% Hypotension likely from hypovolemia>>resolved A fib. Hx of HTN, Hyperlipidemia, PVD.  P:  -increased  daily lasix on 03/20/12 -norvasc, hydralazine, lopressor, losartan per cardiology -continue plavix, ASA -continue zocor -amiodarone po since 9/28  RENAL  Lab 03/22/12 0535 03/21/12 0546 03/20/12 0418 03/19/12 0500 03/18/12 0457 03/17/12 0530  NA 130* 127* 129* 129* 131* --  K 3.7 3.5 -- -- -- --  CL 91* 88* 92* 91* 92* --  CO2 29 31 29 30 31  --  BUN 11 11 13 20  25* --  CREATININE 0.95 0.96 0.96 1.03 1.20 --  CALCIUM 8.6 8.5 8.0* 8.1* 8.3* --  MG -- 1.9 1.7 -- 2.2 2.0  PHOS -- 3.7 3.2 -- 4.3 3.9   Intake/Output      10/01 0701 - 10/02 0700 10/02 0701 - 10/03 0700   P.O. 720    Total Intake(mL/kg) 720 (11.3)    Urine (mL/kg/hr) 1150 (0.8)    Total Output 1150    Net -430         Urine Occurrence 1 x     Foley:  9/25>>>  A:   Hyponatremia >> improved/stabe but poses risk for  balance issues Hypomag - mild  P:   - MAg goal > 2; repleted on 03/20/12 K goal > 4 -f/u BMET -monitor urine outpt, electrolytes, renal fx  GASTROINTESTINAL  Lab 03/19/12 0500 03/18/12 0457 03/16/12 1906  AST 94* 257* 18  ALT 45 62* 10  ALKPHOS 58 56 65  BILITOT 0.2* 0.2* 0.3  PROT 5.8* 5.6* 6.5  ALBUMIN 2.7* 2.6* 3.0*    A:   Nutrition. Increased LFT's likely from hypotension.  - on 03/19/12: LFTs normal  P:   -heart healthy diet   HEMATOLOGIC  Lab 03/20/12 0418 03/19/12 0500 03/18/12 0457 03/17/12 0530  HGB 10.4* 10.5* 10.9* 12.6*  HCT 30.5* 31.0* 32.0* 37.0*  PLT 242 253 255 294  INR -- -- -- --  APTT -- -- -- --   A:  Anemia>>likely hemodilutional  P:  - PRBC for hgb </= 8gm% in view of ACS exception is active bleeding with hemodynamic instability, then transfuse regardless of hemoglobin value.  At at all times try to transfuse 1 unit prbc as possible with exception of active hemorrhage    INFECTIOUS  Lab 03/20/12 0418 03/19/12 0500 03/18/12 0457 03/17/12 0530  WBC 13.8* 15.9* 19.0* 21.4*  PROCALCITON -- -- -- --   Cultures: 9/25 BCx2>>>neg 9/25 Sputum>>>not done 9/25 U. Strep>>>negative 9/25 U. Legion>>>negative  Antibiotics: Rocephin (AECOPD) 9/25>>>9/29 Zithromax (AECOPD) 9/25>>9/25 Doxycycline po (AECOPD) 9/29 >> 10/1 A:   Acute exacerbation of COPD  P:   - abx completed  ENDOCRINE  Lab 03/18/12 0756 03/18/12 0455 03/17/12 2313 03/17/12 1953 03/17/12 1816  GLUCAP 90 106* 113* 114* 114*   A:   No acute issues  P:   -d/c CBG checks  NEUROLOGIC  A:   Encephalopathy>>resolved Hx of CVA P:   -continue plavix - PT consult ordered  03/19/12  PCCM Available  PRN  BEST PRACTICE / DISPOSITION Level of Care:  floor Primary Service: [facility] cardiology Consultants:  PCCM Code Status:  LCB, no CPR and no cardioversion. Diet: Heart healthy DVT Px: lmwh GI Px:  Protonix Skin Integrity:  Intact Social / Family:  None at  bedside   [facility] Minor ACNP Adolph Pollack PCCM Pager [phone] till 3 pm If no answer page [phone] 03/23/2012, 10:27 AM  PCCM will sign off, please have patient come see Dr. Delton Coombes as outpatient 1-2 wks post discharge.  PCCM will sign off, please call back if needed.  Patient seen and examined, agree with above note.  I dictated the care and orders written for this patient under my direction.  [name], M.D. [phone]

## 2012-03-23 NOTE — Progress Notes (Signed)
Subjective:  C/O back pain last night, better this am. SOB improved.  Objective:  Vital Signs in the last 24 hours: Temp:  [97.4 F (36.3 C)-98 F (36.7 C)] 97.4 F (36.3 C) (10/02 0503) Pulse Rate:  [58-76] 58  (10/02 0503) Resp:  [16-18] 18  (10/02 0503) BP: (97-129)/(57-68) 124/67 mmHg (10/02 0503) SpO2:  [90 %-95 %] 90 % (10/02 0503) Weight:  [63.64 kg (140 lb 4.8 oz)] 63.64 kg (140 lb 4.8 oz) (10/02 0503)  Intake/Output from previous day:  Intake/Output Summary (Last 24 hours) at 03/23/12 0935 Last data filed at 03/23/12 0600  Gross per 24 hour  Intake    720 ml  Output   1150 ml  Net   -430 ml    Physical Exam: General appearance: alert, cooperative and no distress Lungs: clear to auscultation bilaterally Heart: regular rate and rhythm   Rate: 58  Rhythm: sinus bradycardia  Lab Results: No results found for this basename: WBC:2,HGB:2,PLT:2 in the last 72 hours  Basename 03/22/12 0535 03/21/12 0546  NA 130* 127*  K 3.7 3.5  CL 91* 88*  CO2 29 31  GLUCOSE 99 95  BUN 11 11  CREATININE 0.95 0.96   No results found for this basename: TROPONINI:2,CK,MB:2 in the last 72 hours Hepatic Function Panel No results found for this basename: PROT,ALBUMIN,AST,ALT,ALKPHOS,BILITOT,BILIDIR,IBILI in the last 72 hours No results found for this basename: CHOL in the last 72 hours No results found for this basename: INR in the last 72 hours  Imaging: Imaging results have been reviewed  Cardiac Studies:  Assessment/Plan:   Principal Problem:  *STEMI, unsuccessful RCA PCI 03/17/12 Active Problems:  Acute respiratory failure  Acute pulmonary edema  COPD with respiratory failure, acute  PAF, post MI  NSVT, post MI  Ischemic cardiomyopathy, EF 30-35% by echo  Tobacco abuse  PVD, history of Lt Ax-fem and fem-fem BPG in past, Dr Manson Passey follows  HTN (hypertension)  Dyslipidemia  History of CVA (cerebrovascular accident)   Plan- He is still on O2. BNP coming down. Back  pain sounded M/S, and he has had this in the past.  Hopefully discharge in am. He will need HHRN, PT, and possibly home O2. I spoke with Tresa Endo from [employer] [facility] last night. Check room air O2 sat. Continue IV Lasix today, (will give 80mg ).   Corine Shelter PA-C 03/23/2012, 9:35 AM

## 2012-03-24 ENCOUNTER — Encounter (HOSPITAL_COMMUNITY): Payer: Self-pay | Admitting: Cardiology

## 2012-03-24 DIAGNOSIS — G9349 Other encephalopathy: Secondary | ICD-10-CM | POA: Diagnosis present

## 2012-03-24 LAB — BASIC METABOLIC PANEL
BUN: 11 mg/dL (ref 6–23)
CO2: 32 mEq/L (ref 19–32)
Calcium: 8.6 mg/dL (ref 8.4–10.5)
Chloride: 91 mEq/L — ABNORMAL LOW (ref 96–112)
Creatinine, Ser: 1.06 mg/dL (ref 0.50–1.35)
GFR calc Af Amer: 80 mL/min — ABNORMAL LOW (ref >90)
GFR calc non Af Amer: 69 mL/min — ABNORMAL LOW (ref >90)
Glucose, Bld: 97 mg/dL (ref 70–99)
Potassium: 3.7 mEq/L (ref 3.5–5.1)
Sodium: 128 mEq/L — ABNORMAL LOW (ref 135–145)

## 2012-03-24 LAB — CBC
HCT: 30.9 % — ABNORMAL LOW (ref 39.0–52.0)
Hemoglobin: 10.5 g/dL — ABNORMAL LOW (ref 13.0–17.0)
MCH: 30.3 pg (ref 26.0–34.0)
MCHC: 34 g/dL (ref 30.0–36.0)
MCV: 89.3 fL (ref 78.0–100.0)
Platelets: 350 10*3/uL (ref 150–400)
RBC: 3.46 MIL/uL — ABNORMAL LOW (ref 4.22–5.81)
RDW: 13.4 % (ref 11.5–15.5)
WBC: 10.2 10*3/uL (ref 4.0–10.5)

## 2012-03-24 LAB — PRO B NATRIURETIC PEPTIDE: Pro B Natriuretic peptide (BNP): 5383 pg/mL — ABNORMAL HIGH (ref 0–125)

## 2012-03-24 MED ORDER — LOSARTAN POTASSIUM 50 MG PO TABS
50.0000 mg | ORAL_TABLET | Freq: Two times a day (BID) | ORAL | Status: DC
  Administered 2012-03-24 – 2012-03-27 (×7): 50 mg via ORAL
  Filled 2012-03-24 (×9): qty 1

## 2012-03-24 MED ORDER — FUROSEMIDE 40 MG PO TABS
40.0000 mg | ORAL_TABLET | Freq: Every day | ORAL | Status: DC
  Administered 2012-03-24: 40 mg via ORAL
  Filled 2012-03-24 (×2): qty 1

## 2012-03-24 MED ORDER — POTASSIUM CHLORIDE CRYS ER 20 MEQ PO TBCR: 20.0000 meq | EXTENDED_RELEASE_TABLET | Freq: Once | ORAL | Status: DC

## 2012-03-24 NOTE — Progress Notes (Signed)
CARDIAC REHAB PHASE I   PRE:  Rate/Rhythm: 55 SB    BP: sitting 92/50    SaO2: 93 1/4 L, 94 RA  MODE:  Ambulation: 200 ft   POST:  Rate/Rhythm: 70    BP: sitting 110/60     SaO2: 92 2L, 95 RA at rest  SaO2 good at rest but Pt desat without O2 when walking, see below. C/o sharp pain around ribs with deep inhalation. Also got muscle pain along right scapula during walk. Cut walk short. Felt better after massage. Will f/u.  SATURATION QUALIFICATIONS:  Patient Saturations on Room Air at Rest = 94%  Patient Saturations on Room Air while Ambulating = 85%  Patient Saturations on 2 Liters of oxygen while Ambulating = 92%  Statement of medical necessity for home oxygen: unable to keep SaO2 up while walking. [id number] Harriet Masson CES, ACSM

## 2012-03-24 NOTE — Progress Notes (Signed)
O2 sats 87% while ambulating on 3L [place]; will cont. To monitor.

## 2012-03-24 NOTE — Progress Notes (Signed)
Physical Therapy Treatment Patient Details Name: [name] [name] MRN: [id number] DOB: 14-Jun-1941 Today's Date: 03/24/2012 Time: 4098-1191 PT Time Calculation (min): 30 min  PT Assessment / Plan / Recommendation Comments on Treatment Session  Pt admitted with STEMI/VDRF and progresses daily with therapy. Pt able to again increase tolerance to mobility with increased ambulation distance/independence.    Follow Up Recommendations  No PT follow up;Supervision - Intermittent    Barriers to Discharge        Equipment Recommendations  None recommended by PT    Recommendations for Other Services    Frequency Min 3X/week   Plan Discharge plan remains appropriate;Frequency remains appropriate    Precautions / Restrictions Precautions Precautions: None Restrictions Weight Bearing Restrictions: No   Pertinent Vitals/Pain None    Mobility  Bed Mobility Bed Mobility: Not assessed Transfers Transfers: Sit to Stand;Stand to Sit Sit to Stand: 6: Modified independent (Device/Increase time) Stand to Sit: 6: Modified independent (Device/Increase time) Ambulation/Gait Ambulation/Gait Assistance: 5: Supervision Ambulation Distance (Feet): 1000 Feet Assistive device: None Ambulation/Gait Assistance Details: Verbal cues for tall posture and pursed lip breathing to slow breathing/decrease DOE. Gait Pattern: Within Functional Limits Stairs: No Wheelchair Mobility Wheelchair Mobility: No    Exercises     PT Diagnosis:    PT Problem List:   PT Treatment Interventions:     PT Goals Acute Rehab PT Goals PT Goal Formulation: With patient Time For Goal Achievement: 03/27/12 Potential to Achieve Goals: Good PT Goal: Sit to Stand - Progress: Met PT Goal: Stand to Sit - Progress: Met PT Goal: Ambulate - Progress: Progressing toward goal  Visit Information  Last PT Received On: 03/24/12 Assistance Needed: +1    Subjective Data  Subjective: "I just don't want to go home too  early." Patient Stated Goal: Get strong.   Cognition  Overall Cognitive Status: Appears within functional limits for tasks assessed/performed Arousal/Alertness: Awake/alert Orientation Level: Appears intact for tasks assessed Behavior During Session: [facility] for tasks performed    Balance  Balance Balance Assessed: No  End of Session PT - End of Session Equipment Utilized During Treatment: Gait belt;Oxygen (3L O2 via [place] with gait.) Activity Tolerance: Patient tolerated treatment well Patient left: in chair;with call bell/phone within reach Nurse Communication: Mobility status   GP     Cephus Shelling 03/24/2012, 10:27 AM  03/24/2012 Cephus Shelling, PT, DPT [phone]

## 2012-03-24 NOTE — Progress Notes (Addendum)
Subjective: No complaints.  Has not yet walked today, will check for DOE Yesterday SaO2 dropped to < 88% with ambulation on RA.   Objective: Vital signs in last 24 hours: Temp:  [98 F (36.7 C)-98.8 F (37.1 C)] 98 F (36.7 C) (10/03 0417) Pulse Rate:  [60-71] 71  (10/03 0958) Resp:  [18] 18  (10/03 0417) BP: (99-121)/(55-75) 117/61 mmHg (10/03 0958) SpO2:  [91 %-93 %] 91 % (10/03 0417) Weight:  [62.2 kg (137 lb 2 oz)] 62.2 kg (137 lb 2 oz) (10/03 0417) Weight change: -1.44 kg (-3 lb 2.8 oz) Last BM Date: 03/24/12 Intake/Output from previous day: -2925 10/02 0701 - 10/03 0700 In: 1200 [P.O.:1200] Out: 4125 [Urine:4125] Intake/Output this shift:   PE: General:alert and oriented, MAE, pleasant affect Heart:S1S2 RRR Lungs:diminished in the bases, no wheezes Abd:+ BS, soft, non tender Ext:no edema Neuro:A&O X 3, MAE   Lab Results:  [facility] 03/24/12 0505  WBC 10.2  HGB 10.5*  HCT 30.9*  PLT 350   BMET  Basename 03/24/12 0505 03/22/12 0535  NA 128* 130*  K 3.7 3.7  CL 91* 91*  CO2 32 29  GLUCOSE 97 99  BUN 11 11  CREATININE 1.06 0.95  CALCIUM 8.6 8.6   No results found for this basename: TROPONINI:2,CK,MB:2 in the last 72 hours  Lab Results  Component Value Date   CHOL 123 03/17/2012   HDL 48 03/17/2012   LDLCALC 58 03/17/2012   TRIG 85 03/17/2012   CHOLHDL 2.6 03/17/2012   Lab Results  Component Value Date   HGBA1C 5.4 03/17/2012     Lab Results  Component Value Date   TSH 1.939 03/17/2012    EKG: No new ECG.  Will order prior to d/c Studies/Results: Left ventricle: The cavity size was normal. There was mild concentric hypertrophy. Systolic function was moderately to severely reduced. The estimated ejection fraction was in the range of 30% to 35%. Moderate hypokinesis of the inferolateral, inferior, and inferoseptal myocardium.  Doppler parameters are consistent with abnormal left ventricular relaxation (grade 1 diastolic dysfunction). - Aortic valve:  Trivial regurgitation. - Right ventricle: The cavity size was mildly dilated. Wall thickness was normal. Systolic function was mildly reduced. - Right atrium: The atrium was mildly dilated. - Atrial septum: No defect or patent foramen ovale was identified.  Medications: I have reviewed the patient's current medications.    Marland Kitchen amiodarone  200 mg Oral BID  . amLODipine  5 mg Oral Daily  . antiseptic oral rinse  15 mL Mouth Rinse BID  . aspirin  81 mg Oral Daily  . atorvastatin  20 mg Oral q1800  . clopidogrel  75 mg Oral Q breakfast  . enoxaparin (LOVENOX) injection  40 mg Subcutaneous Q24H  . furosemide  80 mg Intravenous Once  . furosemide  40 mg Oral Daily  . gabapentin  600 mg Oral BID  . isosorbide mononitrate  30 mg Oral Daily  . losartan  25 mg Oral BID  . metoprolol tartrate  12.5 mg Oral TID  . nicotine  7 mg Transdermal Q24H  . pantoprazole  40 mg Oral Q1200  . potassium chloride  20 mEq Oral Daily  . traZODone  75 mg Oral QHS  . DISCONTD: potassium chloride  20 mEq Oral Once  . DISCONTD: potassium phosphate (monobasic)  500 mg Oral BID   Assessment/Plan: Principal Problem:  *STEMI, unsuccessful RCA PCI 03/17/12 Active Problems:  Acute combined systolic and diastolic heart failure - in setting of  Inferior STEMI  Hypoxia - with ambulation, SaO2 to 87% on RA.; not previously on Home O2. COPD + ICM  Tobacco abuse  Acute respiratory failure  Acute pulmonary edema  COPD with respiratory failure, acute  PVD, history of Lt Ax-fem and fem-fem BPG in past, Dr Manson Passey follows  HTN (hypertension)  Dyslipidemia  History of CVA (cerebrovascular accident)  PAF, post MI  NSVT, post MI  Ischemic cardiomyopathy, EF 30-35% by echo  Other encephalopathy, Acute, secondary to hypotension and resp. failure on admit now resolved at discharge  At risk for sudden cardiac death, due to ICM, d/c'd with lifevest  PLAN: Pro BNP down to 5383 from 7722,  Pk troponin >20 Hyponatremic Needs  nicoderm patch, no driving until follow up with Dr. Allyson Sabal. Will add po Lasix daily Will need home O2 based upon yesterday's assessment with PT.  May discharge today depending on tolerance of ambulation.  Has Lifevest in room for discharge.   LOS: 8 days   [name] [name] W 03/24/2012, 10:11 AM  I have seen & examined the patient along with Nada Boozer, NP.  I agree with her findings, exam & recommendations.   1. BP & HR stable on current medications with no further arrhythmias.  Imdur + low dose BB along with ARB.    Would consider converting from CCB to increased ARB or BB dose in the OP setting if dc today.  Otherwise, will increase BB dose here & d/c Amlodipine prior to d/c.  Continues on Amiodarone load @ 200mg  bid until seen in f/u.   Will need TSH checked pre-d/c or prior to f/u along with CMP)  Needs to have LifeVest on -- recheck EF in ~3 months to determine +/- ICD. 2. Continue with ASA & Statin (LFTs checked earlier). 3. Dramatic diuresis overnight of ~3063ml.  Will need standing PO dose. --> with Na in ~128-130 range, will need lab in f/u as noted above.  Will add proBNP to f/u labs as well.   4. Currently ambulating in the hall -- still SOB while ambulating & was on 3L O2.  Given that he continues to be SOB with exertion & requiring O2 @ 3L, we discussed options for discharge.  I am concerned with his potential for "bounce back" given his current level of anxiety over his symptoms.  At this point, I would like to see how he does on PO Lasix, plan d/c Tomorrow AM on Home O2 to be reassessed as OP as long as he feels at least as good as today.   I will convert his CCB to increased ARB dose today for d/c planning. I also encouraged him to wear his LifeVest. Discussed Sliding Scale Lasix dosing - based upon dry wgt +/- 3lb (if > 3lb take additional dose of Lasix daily until back to dry wgt; if > 5Lb up - call the office for additional advice).  Marykay Lex, M.D.,  M.S. [facility] [address] [place], [place]  [address]  [phone] Pager # [phone]  03/24/2012 10:22 AM

## 2012-03-25 ENCOUNTER — Inpatient Hospital Stay (HOSPITAL_COMMUNITY): Payer: Medicare Other

## 2012-03-25 LAB — BASIC METABOLIC PANEL
Chloride: 89 mEq/L — ABNORMAL LOW (ref 96–112)
Creatinine, Ser: 1.05 mg/dL (ref 0.50–1.35)
GFR calc Af Amer: 81 mL/min — ABNORMAL LOW (ref >90)
Potassium: 3.7 mEq/L (ref 3.5–5.1)
Sodium: 127 mEq/L — ABNORMAL LOW (ref 135–145)

## 2012-03-25 LAB — TROPONIN I: Troponin I: 1.46 ng/mL (ref <0.30)

## 2012-03-25 LAB — CK TOTAL AND CKMB (NOT AT ARMC): Total CK: 108 U/L (ref 7–232)

## 2012-03-25 LAB — TSH: TSH: 2.506 u[IU]/mL (ref 0.350–4.500)

## 2012-03-25 MED ORDER — NITROGLYCERIN 0.4 MG SL SUBL
SUBLINGUAL_TABLET | SUBLINGUAL | Status: AC
  Administered 2012-03-25: 0.4 mg via SUBLINGUAL
  Filled 2012-03-25: qty 25

## 2012-03-25 MED ORDER — FUROSEMIDE 40 MG PO TABS
40.0000 mg | ORAL_TABLET | Freq: Two times a day (BID) | ORAL | Status: DC
  Administered 2012-03-25 – 2012-03-26 (×2): 40 mg via ORAL
  Filled 2012-03-25 (×4): qty 1

## 2012-03-25 MED ORDER — NITROGLYCERIN 0.4 MG SL SUBL
0.4000 mg | SUBLINGUAL_TABLET | SUBLINGUAL | Status: DC | PRN
  Filled 2012-03-25: qty 25

## 2012-03-25 MED ORDER — TIOTROPIUM BROMIDE MONOHYDRATE 18 MCG IN CAPS
18.0000 ug | ORAL_CAPSULE | Freq: Every day | RESPIRATORY_TRACT | Status: DC
  Administered 2012-03-25 – 2012-04-06 (×12): 18 ug via RESPIRATORY_TRACT
  Filled 2012-03-25 (×3): qty 5

## 2012-03-25 MED ORDER — MORPHINE SULFATE 2 MG/ML IJ SOLN
2.0000 mg | INTRAMUSCULAR | Status: DC | PRN
  Administered 2012-03-28 – 2012-03-29 (×5): 2 mg via INTRAVENOUS
  Filled 2012-03-25 (×5): qty 1

## 2012-03-25 MED ORDER — NITROGLYCERIN 0.4 MG SL SUBL
0.4000 mg | SUBLINGUAL_TABLET | SUBLINGUAL | Status: DC | PRN
  Administered 2012-03-25: 0.4 mg via SUBLINGUAL

## 2012-03-25 MED ORDER — ISOSORBIDE MONONITRATE ER 60 MG PO TB24
60.0000 mg | ORAL_TABLET | Freq: Every day | ORAL | Status: DC
  Administered 2012-03-26 – 2012-04-01 (×7): 60 mg via ORAL
  Filled 2012-03-25 (×7): qty 1

## 2012-03-25 MED ORDER — POTASSIUM CHLORIDE CRYS ER 20 MEQ PO TBCR
20.0000 meq | EXTENDED_RELEASE_TABLET | Freq: Two times a day (BID) | ORAL | Status: DC
  Administered 2012-03-25 – 2012-03-28 (×6): 20 meq via ORAL
  Filled 2012-03-25 (×7): qty 1

## 2012-03-25 MED ORDER — AMIODARONE HCL 200 MG PO TABS
200.0000 mg | ORAL_TABLET | Freq: Every day | ORAL | Status: DC
  Administered 2012-03-25 – 2012-03-29 (×5): 200 mg via ORAL
  Filled 2012-03-25 (×6): qty 1

## 2012-03-25 NOTE — Progress Notes (Signed)
Pt OOB in chair at bedside, complaint of left chest/back pain, 2/10, pt states much better than during the nite. BP 120/58 , ra sat 88% placed on 2 liters, O2 sat up to 94% one sl nitro given- during timeframe pt complaint increased pain at midback , states it caused him to break into cold sweat, recheck bp 90/58 pain decreased after approx. . Will alert NP  Georgette Dover

## 2012-03-25 NOTE — Progress Notes (Signed)
Physical Therapy Treatment Patient Details Name: [name] [name] MRN: [id number] DOB: [birth year] Today's Date: 03/25/2012 Time: 8119-1478 PT Time Calculation (min): 24 min  PT Assessment / Plan / Recommendation Comments on Treatment Session  Pt admitted with STEMI/VDRF and is progressing. Ambulation distance decreased today due to back pain. Focused on pursed lip breathing and O2 sats with gait today. Pt still with desating down to 89% on 2L O2 via [place] with gait.    Follow Up Recommendations  No PT follow up;Supervision - Intermittent    Barriers to Discharge        Equipment Recommendations  None recommended by PT    Recommendations for Other Services    Frequency Min 3X/week   Plan Discharge plan remains appropriate;Frequency remains appropriate    Precautions / Restrictions Precautions Precautions: None Restrictions Weight Bearing Restrictions: No   Pertinent Vitals/Pain 8/10 in back. Pt reports premedication and repositioned after treatment.  O2 sats the following throughout: At rest prior to ambulation on 1L O2 via [place] - 93% With ambulation on 2L O2 via [place] - 89% At rest after ambulation on 1L O2 via [place] - 93%    Mobility  Bed Mobility Bed Mobility: Not assessed Transfers Transfers: Sit to Stand;Stand to Sit Sit to Stand: 6: Modified independent (Device/Increase time) Stand to Sit: 6: Modified independent (Device/Increase time) Ambulation/Gait Ambulation/Gait Assistance: 5: Supervision Ambulation Distance (Feet): 400 Feet Assistive device: None Ambulation/Gait Assistance Details: Verbal cues for continued tall posture and pursed lip breathing to slow DOE from 3/4 today with gait as well as increase O2 sats with gait. See vitals for O2 sats. Gait Pattern: Within Functional Limits Stairs: No Wheelchair Mobility Wheelchair Mobility: No    Exercises     PT Diagnosis:    PT Problem List:   PT Treatment Interventions:     PT Goals Acute Rehab PT Goals PT Goal  Formulation: With patient Time For Goal Achievement: 03/27/12 Potential to Achieve Goals: Good PT Goal: Sit to Stand - Progress: Met PT Goal: Stand to Sit - Progress: Met PT Goal: Ambulate - Progress: Progressing toward goal  Visit Information  Last PT Received On: 03/25/12 Assistance Needed: +1    Subjective Data  Subjective: "I had a terrible night with this back pain." Patient Stated Goal: Get strong.   Cognition  Overall Cognitive Status: Appears within functional limits for tasks assessed/performed Arousal/Alertness: Awake/alert Orientation Level: Appears intact for tasks assessed Behavior During Session: [facility] for tasks performed    Balance  Balance Balance Assessed: No  End of Session PT - End of Session Equipment Utilized During Treatment: Gait belt;Oxygen (1L O2 via [place] at rest. 2L O2 via [place] with gait.) Activity Tolerance: Patient tolerated treatment well Patient left: in chair;with call bell/phone within reach Nurse Communication: Mobility status   GP     Cephus Shelling 03/25/2012, 8:01 AM  03/25/2012 Cephus Shelling, PT, DPT [phone]

## 2012-03-25 NOTE — Progress Notes (Signed)
Name: [name] [name] MRN: [id number] DOB: 06-21-1941    LOS: 9  Referring Provider:  EDP Reason for Referral:  Respiratory Failure     PULMONARY / CRITICAL CARE MEDICINE  HPI:  71 yo smoker admitted 03/16/2012 with progressive dyspnea x 2 weeks, acute encephalopathy, respiratory failure with VDRF.  Found to have STEMI, and had emergent cath 9/25.  Followed by Dr. Delton Coombes as outpt.  PMHx HTN, CVA, PVD, Hyperlipidemia, COPD  PFT 10/13/11>>FEV1 2.55 (92%), FEV1% 60, TLC 7.11 (118%), DLCO 68%, no BD  Events Since Admission: 9/25 VDRF, STEMI, A fib 9/25 Cardiac cath>>mild/moderate left-sided disease without apparent hemodynamically significant lesion.  Subtotally, occluded distal RCA, with DES performed.  Medical management. 9/27 - extubated 9/28 - 2  X V tach yesterday post exubation - symptomatic. Needed extra amio bolus. Since then NSR. Eating breakfast. No overnight issues. Daughter at bedside. On 6L [place] o2 but no resp disterss 9/30 on tele floor, NAD.  Subjective: Continues to sleep upright in chair.  Difficult for him to lie flat.  C/O mid back pain -relays that it is similar to pain he had prior to admit.  Denies nausea, shortness of breath.  Continued thick sputum production in am.    Vital Signs: Temp:  [97.7 F (36.5 C)-98.6 F (37 C)] 98.5 F (36.9 C) (10/04 0809) Pulse Rate:  [60-71] 62  (10/04 1036) Resp:  [18] 18  (10/04 0809) BP: (113-127)/(55-72) 113/61 mmHg (10/04 1036) SpO2:  [91 %-94 %] 94 % (10/04 1036)  Physical Examination: General - no distress HEENT - . PEERL +, no jvd Cardiac - s1,s2 irregular Chest - no wheeze/rales+ rhonchi  Abd - soft, non-tender, +bowel sounds Ext - no edema but RT forearm mid aspect with bruising   Neuro -Alert, follows commands, moves all extremities.  Dg Chest 2 View  03/25/2012  *RADIOLOGY REPORT*  Clinical Data: Congestion, desaturation  CHEST - 2 VIEW  Comparison: 03/20/2012  Findings: Cardiomediastinal silhouette is stable.   Slight improved in aeration from prior exam.  Central mild vascular congestion without pulmonary edema.  There is a small left pleural effusion with left basilar atelectasis or infiltrate.  Atherosclerotic calcifications of the thoracic aorta again noted.  Stable bilateral apical pleuroparenchymal scarring.  IMPRESSION: Slight improved in aeration from prior exam.  Central mild vascular congestion without pulmonary edema.  There is a small left pleural effusion with left basilar atelectasis or infiltrate.   Original Report Authenticated By: Natasha Mead, M.D.      ASSESSMENT AND PLAN  PULMONARY No results found for this basename: PHART:5,PCO2:5,PCO2ART:5,PO2ART:5,HCO3:5,O2SAT:5 in the last 168 hours Ventilator Settings:    ETT:  9/25>>>9/27 A:   Acute respiratory failure 2nd to STEMI, ? AECOPD, home narcotics/sedatives Mild COPD- based on PFT from 4/13 Chronic cough- contributors unclear, suspect PND, GERD. Consider possible contribution of his ARB, irritation from ETT  P:   -continue Incentive spirometry and diuresis  03/20/12 -f/u CXR intermittently -will d/c scheduled BD's; he was not on BD's as an outpt, PFT with mild AFL in 4/13.  -continue Spiriva  -albuterol prn for now -would re-check ambulatory O2 needs to determine flow rate as he is only on 0.5 L / min currently  CARDIOVASCULAR  Lab 03/24/12 0505 03/22/12 0535 03/20/12 0419  TROPONINI -- -- --  LATICACIDVEN -- -- --  PROBNP 5383.0* 6485.0* 7722.0*    Lines:   9/25 Rt [place] TLC >>>03/20/12 9/25 R radial a-line>>9/25  A:  STEMI.- EF 9/26 35% Hypotension -likely from hypovolemia>>resolved  A fib. Hx of HTN, Hyperlipidemia, PVD.  P:  -lasix 40 BID -amiodarone, asa, plavix, imdur, cozaar, lopressor  -trial of NTG now - see if resolves back pain, concern for EKG changes.  Discussed with Cardiology NP.  RENAL  Lab 03/25/12 0510 03/24/12 0505 03/22/12 0535 03/21/12 0546 03/20/12 0418  NA 127* 128* 130* 127* 129*  K 3.7  3.7 -- -- --  CL 89* 91* 91* 88* 92*  CO2 30 32 29 31 29   BUN 10 11 11 11 13   CREATININE 1.05 1.06 0.95 0.96 0.96  CALCIUM 8.8 8.6 8.6 8.5 8.0*  MG -- -- -- 1.9 1.7  PHOS -- -- -- 3.7 3.2   Intake/Output      10/03 0701 - 10/04 0700 10/04 0701 - 10/05 0700   P.O. 240 240   Total Intake(mL/kg) 240 (3.9) 240 (3.9)   Urine (mL/kg/hr) 1650 (1.1) 200 (0.7)   Total Output 1650 200   Net -1410 +40        Stool Occurrence  1 x    Foley:  9/25>>>  A:   Hyponatremia >> improved/stabe but poses risk for balance issues Hypomag - mild.    P:   -f/u BMET -monitor urine outpt, electrolytes, renal fx  GASTROINTESTINAL  Lab 03/19/12 0500  AST 94*  ALT 45  ALKPHOS 58  BILITOT 0.2*  PROT 5.8*  ALBUMIN 2.7*    A:   Nutrition. Increased LFT's likely from hypotension.  P:   -heart healthy diet   HEMATOLOGIC  Lab 03/24/12 0505 03/20/12 0418 03/19/12 0500  HGB 10.5* 10.4* 10.5*  HCT 30.9* 30.5* 31.0*  PLT 350 242 253  INR -- -- --  APTT -- -- --   A:  Anemia>>likely hemodilutional  P:  - PRBC for hgb </= 8gm% in view of ACS exception is active bleeding with hemodynamic instability, then transfuse regardless of hemoglobin value.  At at all times try to transfuse 1 unit prbc as possible with exception of active hemorrhage    INFECTIOUS  Lab 03/24/12 0505 03/20/12 0418 03/19/12 0500  WBC 10.2 13.8* 15.9*  PROCALCITON -- -- --   Cultures: 9/25 BCx2>>>neg 9/25 Sputum>>>not done 9/25 U. Strep>>>negative 9/25 U. Legion>>>negative  Antibiotics: Rocephin (AECOPD) 9/25>>>9/29 Zithromax (AECOPD) 9/25>>9/25 Doxycycline po (AECOPD) 9/29 >> 10/1  A:   Acute exacerbation of COPD  P:   - abx completed  ENDOCRINE No results found for this basename: GLUCAP:5 in the last 168 hours A:   No acute issues  P:   -d/c CBG checks  NEUROLOGIC  A:   Encephalopathy>>resolved Hx of CVA  P:   -continue plavix -PT consult ordered  03/19/12   Pulmonary Discharge  Recommendations -would re-check ambulatory O2 needs to determine flow rate as he is only on 0.5 L / min currently -Spiriva -follow up with Dr. Delton Coombes 10/21 -may be seen by Rubye Oaks, NP in office before Dr. Delton Coombes if discharged soon.     BEST PRACTICE / DISPOSITION Level of Care:  floor Primary Service: [facility] cardiology Consultants:  PCCM Code Status:  LCB, no CPR and no cardioversion. Diet: Heart healthy DVT Px: lmwh GI Px:  Protonix Skin Integrity:  Intact Social / Family:  None at bedside   Canary Brim, NP-C [facility] Pgr: [phone] or [phone]  Agree with pulmonary recommendations above.  F/U arranged.  PCCM will sign off, please call back if needed.  Patient seen and examined, agree with above note.  I dictated the  care and orders written for this patient under my direction.  [name], M.D. [phone]

## 2012-03-25 NOTE — Progress Notes (Signed)
Subjective: Did not sleep well had pain in back across shoulder blades, improves with massage.  Objective: Vital signs in last 24 hours: Temp:  [97.7 F (36.5 C)-98.6 F (37 C)] 98.5 F (36.9 C) (10/04 0809) Pulse Rate:  [60-71] 60  (10/04 0809) Resp:  [18] 18  (10/04 0809) BP: (117-127)/(55-72) 124/55 mmHg (10/04 0809) SpO2:  [91 %-93 %] 93 % (10/04 0809) Weight change:  Last BM Date: 03/24/12 Intake/Output from previous day: -1200  10/03 0701 - 10/04 0700 In: 240 [P.O.:240] Out: 1650 [Urine:1650] Intake/Output this shift:    PE: General:alert and oriented, pleasant affect Heart:S1S2 RRR Lungs:clear without rales, rhonchi or wheezes Abd:+ BS, soft, nontender Ext:no edema    Lab Results:  [facility] 03/24/12 0505  WBC 10.2  HGB 10.5*  HCT 30.9*  PLT 350   BMET  Basename 03/25/12 0510 03/24/12 0505  NA 127* 128*  K 3.7 3.7  CL 89* 91*  CO2 30 32  GLUCOSE 96 97  BUN 10 11  CREATININE 1.05 1.06  CALCIUM 8.8 8.6   No results found for this basename: TROPONINI:2,CK,MB:2 in the last 72 hours  Lab Results  Component Value Date   CHOL 123 03/17/2012   HDL 48 03/17/2012   LDLCALC 58 03/17/2012   TRIG 85 03/17/2012   CHOLHDL 2.6 03/17/2012   Lab Results  Component Value Date   HGBA1C 5.4 03/17/2012     Lab Results  Component Value Date   TSH 1.939 03/17/2012       EKG: Orders placed during the hospital encounter of 03/16/12  . EKG 12-LEAD  . EKG 12-LEAD  . EKG 12-LEAD  . EKG 12-LEAD  . EKG 12-LEAD  . EKG 12-LEAD  . EKG 12-LEAD  . EKG 12-LEAD  . EKG 12-LEAD  . EKG 12-LEAD  . EKG 12-LEAD  . EKG 12-LEAD  . EKG 12-LEAD  . EKG 12-LEAD  . EKG 12-LEAD  . EKG 12-LEAD  . EKG 12-LEAD  . EKG 12-LEAD  . EKG 12-LEAD  . EKG 12-LEAD  . EKG 12-LEAD  . EKG 12-LEAD  . EKG 12-LEAD  . EKG 12-LEAD  . EKG    Studies/Results: No results found.  Medications: I have reviewed the patient's current medications.    Marland Kitchen amiodarone  200 mg Oral BID  . antiseptic  oral rinse  15 mL Mouth Rinse BID  . aspirin  81 mg Oral Daily  . atorvastatin  20 mg Oral q1800  . clopidogrel  75 mg Oral Q breakfast  . enoxaparin (LOVENOX) injection  40 mg Subcutaneous Q24H  . furosemide  40 mg Oral Daily  . gabapentin  600 mg Oral BID  . isosorbide mononitrate  30 mg Oral Daily  . losartan  50 mg Oral BID  . metoprolol tartrate  12.5 mg Oral TID  . nicotine  7 mg Transdermal Q24H  . pantoprazole  40 mg Oral Q1200  . potassium chloride  20 mEq Oral Daily  . traZODone  75 mg Oral QHS  . DISCONTD: amLODipine  5 mg Oral Daily  . DISCONTD: losartan  25 mg Oral BID  . DISCONTD: potassium chloride  20 mEq Oral Once  . DISCONTD: potassium phosphate (monobasic)  500 mg Oral BID   Assessment/Plan: Principal Problem:  *STEMI, unsuccessful RCA PCI 03/17/12 Active Problems:  Tobacco abuse  Acute respiratory failure  Acute pulmonary edema  COPD with respiratory failure, acute  PVD, history of Lt Ax-fem and fem-fem BPG in past, Dr Manson Passey follows  HTN (hypertension)  Dyslipidemia  History of CVA (cerebrovascular accident)  PAF, post MI  NSVT, post MI  Ischemic cardiomyopathy, EF 30-35% by echo  Other encephalopathy, Acute, secondary to hypotension and resp. failure on admit now resolved at discharge  At risk for sudden cardiac death, due to ICM, d/c'd with lifevest  Acute combined systolic and diastolic heart failure - in setting of Inferior STEMI  Hypoxia - with ambulation, SaO2 to 87% on RA.; not previously on Home O2. COPD + ICM  PLAN: continues to desat. To 89% on 2L. With ambulation. Per CCM 02 needs to be 3L with ambulation. Will check CXR, ask pul to see for desat with ambulation. Increase lasix to 40 bid.  Keep today? Hope to d/c tomorrow. Will decrease amiodarone to 200 mg daily.  EKG with inf. ST elevation, ? Pain in back, which he had prior to admit be cardiac pain.  Will try NTG if BP permits, check ckmb and troponin.  LOS: 9 days   [name],LAURA  R 03/25/2012, 8:42 AM

## 2012-03-25 NOTE — Progress Notes (Signed)
Critical lab result called; troponin 1.46; Nada Boozer called to make aware; will await callback.

## 2012-03-25 NOTE — Progress Notes (Addendum)
[facility]  Patient Status: New  [facility] is providing the following services: SN and PT  If patient discharges after hours, please call [phone].   Jodene Nam 03/25/2012, 9:35 AM  Possible weekend discharge - scheduled for Monday nursing visit at home

## 2012-03-25 NOTE — Progress Notes (Signed)
Pt. Seen and examined. Agree with the NP/PA-C note as written.  I would continue to diurese. Agree with increased dose lasix, may be with [place] over the weekend. Decrease amiodarone. Increase imdur to 60 mg daily.  Chrystie Nose, MD, [facility] Attending Cardiologist [facility]

## 2012-03-25 NOTE — Progress Notes (Signed)
Spoke with Nada Boozer, NP; no new orders; will cont. To monitor.

## 2012-03-25 NOTE — Progress Notes (Signed)
CARDIAC REHAB PHASE I   PRE:  Rate/Rhythm: 60 SR  BP:  Supine:   Sitting: 110/60  Standing:    SaO2: 93 2L  MODE:  Ambulation: 1040 ft   POST:  Rate/Rhythem: 64  BP:  Supine:   Sitting: 120/68  Standing:    SaO2: 94 3L after walk 93 3L during walk 1350-1430 Assisted x 1 to ambulate and used O2 3L. Gait steady,walked 1040 feet. He took two standing rest stops related to SOB. O2 sat during walk 93% on 3L after. Pt back to recliner after with call light in reach. Completed NTG and exercise education with pt. He voices understanding.                                                                     [name] [name]

## 2012-03-26 LAB — CK TOTAL AND CKMB (NOT AT ARMC)
CK, MB: 5.5 ng/mL — ABNORMAL HIGH (ref 0.3–4.0)
Relative Index: 4.3 — ABNORMAL HIGH (ref 0.0–2.5)
Total CK: 128 U/L (ref 7–232)

## 2012-03-26 LAB — BASIC METABOLIC PANEL
BUN: 11 mg/dL (ref 6–23)
CO2: 30 mEq/L (ref 19–32)
GFR calc non Af Amer: 81 mL/min — ABNORMAL LOW (ref >90)
Glucose, Bld: 100 mg/dL — ABNORMAL HIGH (ref 70–99)
Potassium: 3.8 mEq/L (ref 3.5–5.1)

## 2012-03-26 LAB — TROPONIN I: Troponin I: 1.24 ng/mL (ref <0.30)

## 2012-03-26 MED ORDER — MAGNESIUM SULFATE 40 MG/ML IJ SOLN
4.0000 g | Freq: Once | INTRAMUSCULAR | Status: AC
  Administered 2012-03-26: 4 g via INTRAVENOUS
  Filled 2012-03-26: qty 100

## 2012-03-26 MED ORDER — NICOTINE 21 MG/24HR TD PT24
21.0000 mg | MEDICATED_PATCH | Freq: Every day | TRANSDERMAL | Status: DC
Start: 2012-03-26 — End: 2012-04-04
  Administered 2012-03-26 – 2012-04-03 (×9): 21 mg via TRANSDERMAL
  Filled 2012-03-26 (×10): qty 1

## 2012-03-26 NOTE — Progress Notes (Addendum)
[facility]  DAILY PROGRESS NOTE   Subjective:  No improvement in O2 requirement with diuresis. Sodium and chloride are decreasing, suggesting possible over diuresis, however, BUN stable. BNP is unchanged. Troponin is stable around 1.58, however, his chest pain seems to have improved on imdur.  Objective:  Temp:  [98.2 F (36.8 C)-98.6 F (37 C)] 98.2 F (36.8 C) (10/05 0442) Pulse Rate:  [59-70] 59  (10/05 0442) Resp:  [20] 20  (10/05 0442) BP: (106-116)/(56-65) 106/56 mmHg (10/05 0442) SpO2:  [91 %-94 %] 93 % (10/05 0810) Weight:  [61.2 kg (134 lb 14.7 oz)] 61.2 kg (134 lb 14.7 oz) (10/05 0442) Weight change:   Intake/Output from previous day: 10/04 0701 - 10/05 0700 In: 360 [P.O.:360] Out: 2300 [Urine:2300]  Intake/Output from this shift:    Medications: Current Facility-Administered Medications  Medication Dose Route Frequency Provider Last Rate Last Dose  . 0.9 %  sodium chloride infusion  250 mL Intravenous PRN Jeanella Craze, NP 10 mL/hr at 03/19/12 2305 250 mL at 03/19/12 2305  . 0.9 %  sodium chloride infusion   Intravenous Continuous Runell Gess, MD 10 mL/hr at 03/20/12 0800    . acetaminophen (TYLENOL) tablet 650 mg  650 mg Oral Q4H PRN Corky Crafts, MD   650 mg at 03/26/12 0023  . albuterol (PROVENTIL) (5 MG/ML) 0.5% nebulizer solution 2.5 mg  2.5 mg Nebulization Q4H PRN Leslye Peer, MD   2.5 mg at 03/22/12 2230  . amiodarone (PACERONE) tablet 200 mg  200 mg Oral Daily Nada Boozer, NP   200 mg at 03/25/12 1047  . antiseptic oral rinse (BIOTENE) solution 15 mL  15 mL Mouth Rinse BID Runell Gess, MD   15 mL at 03/25/12 2200  . aspirin chewable tablet 81 mg  81 mg Oral Daily Corky Crafts, MD   81 mg at 03/25/12 1047  . atorvastatin (LIPITOR) tablet 20 mg  20 mg Oral q1800 Runell Gess, MD   20 mg at 03/25/12 1726  . clopidogrel (PLAVIX) tablet 75 mg  75 mg Oral Q breakfast Corky Crafts, MD   75 mg  at 03/26/12 0829  . enoxaparin (LOVENOX) injection 40 mg  40 mg Subcutaneous Q24H Marty Heck, PHARMD   40 mg at 03/26/12 0829  . fentaNYL (SUBLIMAZE) injection 50 mcg  50 mcg Intravenous Q3H PRN Coralyn Helling, MD   50 mcg at 03/25/12 0416  . furosemide (LASIX) tablet 40 mg  40 mg Oral BID Nada Boozer, NP   40 mg at 03/26/12 0829  . gabapentin (NEURONTIN) tablet 600 mg  600 mg Oral BID Corky Crafts, MD   600 mg at 03/25/12 2108  . isosorbide mononitrate (IMDUR) 24 hr tablet 60 mg  60 mg Oral Daily Chrystie Nose, MD      . losartan (COZAAR) tablet 50 mg  50 mg Oral BID Marykay Lex, MD   50 mg at 03/25/12 2108  . metoprolol tartrate (LOPRESSOR) tablet 12.5 mg  12.5 mg Oral TID Runell Gess, MD   12.5 mg at 03/25/12 2108  . morphine 2 MG/ML injection 2 mg  2 mg Intravenous Q2H PRN Nada Boozer, NP      . nitroGLYCERIN (NITROSTAT) SL tablet 0.4 mg  0.4 mg Sublingual Q5 Min x 3 PRN Jeanella Craze, NP      . nitroGLYCERIN (NITROSTAT) SL tablet 0.4 mg  0.4 mg Sublingual Q5 min PRN Vernona Rieger  [name], NP   0.4 mg at 03/25/12 1214  . ondansetron (ZOFRAN) injection 4 mg  4 mg Intravenous Q6H PRN Corky Crafts, MD      . pantoprazole (PROTONIX) EC tablet 40 mg  40 mg Oral Q1200 Coralyn Helling, MD   40 mg at 03/25/12 1222  . potassium chloride SA (K-DUR,KLOR-CON) CR tablet 20 mEq  20 mEq Oral BID Nada Boozer, NP   20 mEq at 03/25/12 2107  . tiotropium (SPIRIVA) inhalation capsule 18 mcg  18 mcg Inhalation Daily Nada Boozer, NP   18 mcg at 03/26/12 0810  . traZODone (DESYREL) tablet 75 mg  75 mg Oral QHS Runell Gess, MD   75 mg at 03/25/12 2109  . DISCONTD: isosorbide mononitrate (IMDUR) 24 hr tablet 30 mg  30 mg Oral Daily Eda Paschal [place], [place]   30 mg at 03/25/12 1048  . DISCONTD: nicotine (NICODERM CQ - dosed in mg/24 hr) patch 7 mg  7 mg Transdermal Q24H Coralyn Helling, MD   7 mg at 03/25/12 1048    Physical Exam: General appearance: alert and no distress Neck: no adenopathy, no  carotid bruit, no JVD, supple, symmetrical, trachea midline and thyroid not enlarged, symmetric, no tenderness/mass/nodules Lungs: clear to auscultation bilaterally Heart: regular rate and rhythm, S1, S2 normal, no murmur, click, rub or gallop Abdomen: soft, non-tender; bowel sounds normal; no masses,  no organomegaly Extremities: extremities normal, atraumatic, no cyanosis or edema Pulses: 2+ and symmetric  Lab Results: Results for orders placed during the hospital encounter of 03/16/12 (from the past 48 hour(s))  TSH     Status: Normal   Collection Time   03/25/12  5:10 AM      Component Value Range Comment   TSH 2.506  0.350 - 4.500 uIU/mL   BASIC METABOLIC PANEL     Status: Abnormal   Collection Time   03/25/12  5:10 AM      Component Value Range Comment   Sodium 127 (*) 135 - 145 mEq/L    Potassium 3.7  3.5 - 5.1 mEq/L    Chloride 89 (*) 96 - 112 mEq/L    CO2 30  19 - 32 mEq/L    Glucose, Bld 96  70 - 99 mg/dL    BUN 10  6 - 23 mg/dL    Creatinine, Ser 6.57  0.50 - 1.35 mg/dL    Calcium 8.8  8.4 - 84.6 mg/dL    GFR calc non Af Amer 70 (*) >90 mL/min    GFR calc Af Amer 81 (*) >90 mL/min   CK TOTAL AND CKMB     Status: Abnormal   Collection Time   03/25/12 12:30 PM      Component Value Range Comment   Total CK 108  7 - 232 U/L    CK, MB 4.8 (*) 0.3 - 4.0 ng/mL    Relative Index 4.4 (*) 0.0 - 2.5   TROPONIN I     Status: Abnormal   Collection Time   03/25/12 12:30 PM      Component Value Range Comment   Troponin I 1.46 (*) <0.30 ng/mL   PRO B NATRIURETIC PEPTIDE     Status: Abnormal   Collection Time   03/26/12  5:00 AM      Component Value Range Comment   Pro B Natriuretic peptide (BNP) 5868.0 (*) 0 - 125 pg/mL   TROPONIN I     Status: Abnormal   Collection Time   03/26/12  5:00 AM      Component Value Range Comment   Troponin I 1.58 (*) <0.30 ng/mL   BASIC METABOLIC PANEL     Status: Abnormal   Collection Time   03/26/12  5:00 AM      Component Value Range Comment     Sodium 124 (*) 135 - 145 mEq/L    Potassium 3.8  3.5 - 5.1 mEq/L    Chloride 87 (*) 96 - 112 mEq/L    CO2 30  19 - 32 mEq/L    Glucose, Bld 100 (*) 70 - 99 mg/dL    BUN 11  6 - 23 mg/dL    Creatinine, Ser 1.61  0.50 - 1.35 mg/dL    Calcium 8.7  8.4 - 09.6 mg/dL    GFR calc non Af Amer 81 (*) >90 mL/min    GFR calc Af Amer >90  >90 mL/min   MAGNESIUM     Status: Normal   Collection Time   03/26/12  5:00 AM      Component Value Range Comment   Magnesium 1.5  1.5 - 2.5 mg/dL     Imaging: Dg Chest 2 View  03/25/2012  *RADIOLOGY REPORT*  Clinical Data: Congestion, desaturation  CHEST - 2 VIEW  Comparison: 03/20/2012  Findings: Cardiomediastinal silhouette is stable.  Slight improved in aeration from prior exam.  Central mild vascular congestion without pulmonary edema.  There is a small left pleural effusion with left basilar atelectasis or infiltrate.  Atherosclerotic calcifications of the thoracic aorta again noted.  Stable bilateral apical pleuroparenchymal scarring.  IMPRESSION: Slight improved in aeration from prior exam.  Central mild vascular congestion without pulmonary edema.  There is a small left pleural effusion with left basilar atelectasis or infiltrate.   Original Report Authenticated By: Natasha Mead, M.D.     Assessment:  1. Principal Problem: 2.  *STEMI, unsuccessful RCA PCI 03/17/12 3. Active Problems: 4.  Tobacco abuse 5.  Acute respiratory failure 6.  Acute pulmonary edema 7.  COPD with respiratory failure, acute 8.  PVD, history of Lt Ax-fem and fem-fem BPG in past, Dr Manson Passey follows 9.  HTN (hypertension) 10.  Dyslipidemia 11.  History of CVA (cerebrovascular accident) 41.  PAF, post MI 62.  NSVT, post MI 14.  Ischemic cardiomyopathy, EF 30-35% by echo 15.  Other encephalopathy, Acute, secondary to hypotension and resp. failure on admit now resolved at discharge 16.  At risk for sudden cardiac death, due to ICM, d/c'd with lifevest 17.  Acute combined systolic  and diastolic heart failure - in setting of Inferior STEMI 18.  Hypoxia - with ambulation, SaO2 to 87% on RA.; not previously on Home O2. COPD + ICM 19.   Plan:  1. At this point, additional diuresis seems to be depleting his intravascular volume, but not affecting his O2 requirements. I would hold diuretics today and encourage po intake. Will keep until tomorrow. If sodium and chloride improving, I would continue to hold diuretics and consider restarting next week when he follows up in the office. He will likely need to be discharged on home oxygen.  Replete magnesium today to >2.0.  Time Spent Directly with Patient:  15 minutes  Length of Stay:  LOS: 10 days   Chrystie Nose, MD, [facility] Attending Cardiologist [facility]  Josmar Messimer C 03/26/2012, 10:20 AM

## 2012-03-26 NOTE — Progress Notes (Signed)
CARDIAC REHAB PHASE I   PRE:  Rate/Rhythm: Sinus Rhythm 67  BP:    Sitting: 110/60     SaO2: 92 3L  MODE:  Ambulation: 1000 ft   POST:  Rate/Rhythem: 76  BP:    Sitting: 122/60     SaO2: 93 3L  1000-1035 Patient ambulated in the hallway on 4L/ min of oxygen without complaints of SOB.  Patient assisted back to the recliner with call light within reach.  Harlon Flor, Arta Bruce

## 2012-03-27 DIAGNOSIS — E871 Hypo-osmolality and hyponatremia: Secondary | ICD-10-CM | POA: Diagnosis present

## 2012-03-27 LAB — EXPECTORATED SPUTUM ASSESSMENT W GRAM STAIN, RFLX TO RESP C

## 2012-03-27 LAB — URINALYSIS, ROUTINE W REFLEX MICROSCOPIC
Ketones, ur: NEGATIVE mg/dL
Leukocytes, UA: NEGATIVE
Nitrite: NEGATIVE
Protein, ur: NEGATIVE mg/dL
Urobilinogen, UA: 0.2 mg/dL (ref 0.0–1.0)

## 2012-03-27 LAB — BASIC METABOLIC PANEL
Calcium: 8.6 mg/dL (ref 8.4–10.5)
GFR calc Af Amer: 67 mL/min — ABNORMAL LOW (ref >90)
GFR calc non Af Amer: 58 mL/min — ABNORMAL LOW (ref >90)
Glucose, Bld: 91 mg/dL (ref 70–99)
Potassium: 4.1 mEq/L (ref 3.5–5.1)
Sodium: 123 mEq/L — ABNORMAL LOW (ref 135–145)

## 2012-03-27 LAB — OSMOLALITY, URINE: Osmolality, Ur: 217 mOsm/kg — ABNORMAL LOW (ref 390–1090)

## 2012-03-27 LAB — SODIUM, URINE, RANDOM: Sodium, Ur: 11 mEq/L

## 2012-03-27 MED ORDER — DEMECLOCYCLINE HCL 150 MG PO TABS
300.0000 mg | ORAL_TABLET | Freq: Two times a day (BID) | ORAL | Status: DC
  Administered 2012-03-27 – 2012-04-02 (×14): 300 mg via ORAL
  Filled 2012-03-27 (×16): qty 2

## 2012-03-27 MED ORDER — SODIUM CHLORIDE 0.9 % IV SOLN: INTRAVENOUS | Status: DC

## 2012-03-27 MED ORDER — SODIUM CHLORIDE 0.9 % IV SOLN
INTRAVENOUS | Status: DC
  Administered 2012-03-27: 75 mL/h via INTRAVENOUS

## 2012-03-27 NOTE — Progress Notes (Signed)
Subjective: He described a little pain in back/shoulder blades last pm  Objective: Vital signs in last 24 hours: Temp:  [97.6 F (36.4 C)-98.1 F (36.7 C)] 97.6 F (36.4 C) (10/06 0500) Pulse Rate:  [59-63] 59  (10/06 0500) Resp:  [18] 18  (10/06 0500) BP: (114-122)/(55-65) 121/56 mmHg (10/06 0500) SpO2:  [93 %-100 %] 100 % (10/06 0500) Weight:  [64.955 kg (143 lb 3.2 oz)] 64.955 kg (143 lb 3.2 oz) (10/06 0500) Weight change: 3.755 kg (8 lb 4.5 oz) Last BM Date: 03/25/12 Intake/Output from previous day: -760 10/05 0701 - 10/06 0700 In: 340 [P.O.:240; IV Piggyback:100] Out: 1100 [Urine:1100] Intake/Output this shift:    PE: General:alert and oriented Heart:S1S2 RRR Lungs:clear Abd:+ BS, soft, non tender Ext:no edema    Lab Results: No results found for this basename: WBC:2,HGB:2,HCT:2,PLT:2 in the last 72 hours BMET  Basename 03/27/12 0555 03/26/12 0500  NA 123* 124*  K 4.1 3.8  CL 85* 87*  CO2 31 30  GLUCOSE 91 100*  BUN 12 11  CREATININE 1.23 0.99  CALCIUM 8.6 8.7    Basename 03/26/12 1247 03/26/12 0500  TROPONINI 1.24* 1.58*    Lab Results  Component Value Date   CHOL 123 03/17/2012   HDL 48 03/17/2012   LDLCALC 58 03/17/2012   TRIG 85 03/17/2012   CHOLHDL 2.6 03/17/2012   Lab Results  Component Value Date   HGBA1C 5.4 03/17/2012     Lab Results  Component Value Date   TSH 2.506 03/25/2012      EKG: Orders placed during the hospital encounter of 03/16/12  . EKG 12-LEAD  . EKG 12-LEAD  . EKG 12-LEAD  . EKG 12-LEAD  . EKG 12-LEAD  . EKG 12-LEAD  . EKG 12-LEAD  . EKG 12-LEAD  . EKG 12-LEAD  . EKG 12-LEAD  . EKG 12-LEAD  . EKG 12-LEAD  . EKG 12-LEAD  . EKG 12-LEAD  . EKG 12-LEAD  . EKG 12-LEAD  . EKG 12-LEAD  . EKG 12-LEAD  . EKG 12-LEAD  . EKG 12-LEAD  . EKG 12-LEAD  . EKG 12-LEAD  . EKG 12-LEAD  . EKG 12-LEAD  . EKG  . EKG 12-LEAD  . EKG 12-LEAD  . EKG 12-LEAD  . EKG 12-LEAD    Studies/Results: Dg Chest 2 View  03/25/2012   *RADIOLOGY REPORT*  Clinical Data: Congestion, desaturation  CHEST - 2 VIEW  Comparison: 03/20/2012  Findings: Cardiomediastinal silhouette is stable.  Slight improved in aeration from prior exam.  Central mild vascular congestion without pulmonary edema.  There is a small left pleural effusion with left basilar atelectasis or infiltrate.  Atherosclerotic calcifications of the thoracic aorta again noted.  Stable bilateral apical pleuroparenchymal scarring.  IMPRESSION: Slight improved in aeration from prior exam.  Central mild vascular congestion without pulmonary edema.  There is a small left pleural effusion with left basilar atelectasis or infiltrate.   Original Report Authenticated By: Natasha Mead, M.D.     Medications: I have reviewed the patient's current medications. Scheduled Meds:    . amiodarone  200 mg Oral Daily  . antiseptic oral rinse  15 mL Mouth Rinse BID  . aspirin  81 mg Oral Daily  . atorvastatin  20 mg Oral q1800  . clopidogrel  75 mg Oral Q breakfast  . enoxaparin (LOVENOX) injection  40 mg Subcutaneous Q24H  . gabapentin  600 mg Oral BID  . isosorbide mononitrate  60 mg Oral Daily  . losartan  50 mg  Oral BID  . magnesium sulfate 1 - 4 g bolus IVPB  4 g Intravenous Once  . metoprolol tartrate  12.5 mg Oral TID  . nicotine  21 mg Transdermal Daily  . pantoprazole  40 mg Oral Q1200  . potassium chloride  20 mEq Oral BID  . tiotropium  18 mcg Inhalation Daily  . traZODone  75 mg Oral QHS  . DISCONTD: furosemide  40 mg Oral BID   Continuous Infusions:    . sodium chloride 10 mL/hr at 03/20/12 0800   PRN Meds:.sodium chloride, acetaminophen, albuterol, fentaNYL, morphine injection, nitroGLYCERIN, nitroGLYCERIN, ondansetron (ZOFRAN) IV  Assessment/Plan: Principal Problem:  *STEMI, unsuccessful RCA PCI 03/17/12 Active Problems:  Tobacco abuse  Acute respiratory failure  Acute pulmonary edema  Hypoxemia  COPD with respiratory failure, acute  PVD, history of Lt  Ax-fem and fem-fem BPG in past, Dr Manson Passey follows  HTN (hypertension)  Dyslipidemia  History of CVA (cerebrovascular accident)  PAF, post MI  NSVT, post MI  Ischemic cardiomyopathy, EF 30-35% by echo  Other encephalopathy, Acute, secondary to hypotension and resp. failure on admit now resolved at discharge  At risk for sudden cardiac death, due to ICM, d/c'd with lifevest  Acute combined systolic and diastolic heart failure - in setting of Inferior STEMI  Hypoxia - with ambulation, SaO2 to 87% on RA.; not previously on Home O2. COPD + ICM  PLAN:Ambulated on 4 L [place] without SOB yesterday. Na still low.  ? D/C home today- life vest is in the room.  Follow up next week and recheck labs.  ? Hold Lasix  Until, has an appt. For Friday the 11th  LOS: 11 days   [name],[name] R 03/27/2012, 8:17 AM

## 2012-03-27 NOTE — Progress Notes (Signed)
Pt. Seen and examined. Agree with the NP/PA-C note as written.  Sodium continues to decline along with chloride, labs indicate rising creatinine. Diuretics being held. 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 Will give IV fluids today. Free H20 restrict. Cannot discharge with falling sodium.  Chrystie Nose, MD, [facility] Attending Cardiologist [facility]

## 2012-03-28 ENCOUNTER — Encounter (HOSPITAL_COMMUNITY): Payer: Self-pay | Admitting: Radiology

## 2012-03-28 ENCOUNTER — Inpatient Hospital Stay (HOSPITAL_COMMUNITY): Payer: Medicare Other

## 2012-03-28 DIAGNOSIS — I5041 Acute combined systolic (congestive) and diastolic (congestive) heart failure: Secondary | ICD-10-CM

## 2012-03-28 LAB — CBC
HCT: 31.1 % — ABNORMAL LOW (ref 39.0–52.0)
HCT: 33.4 % — ABNORMAL LOW (ref 39.0–52.0)
Hemoglobin: 10.6 g/dL — ABNORMAL LOW (ref 13.0–17.0)
MCH: 30.5 pg (ref 26.0–34.0)
MCHC: 34.1 g/dL (ref 30.0–36.0)
MCHC: 34.7 g/dL (ref 30.0–36.0)
MCV: 89.6 fL (ref 78.0–100.0)
MCV: 88.6 fL (ref 78.0–100.0)
Platelets: 394 10*3/uL (ref 150–400)
RBC: 3.47 MIL/uL — ABNORMAL LOW (ref 4.22–5.81)
RDW: 13.3 % (ref 11.5–15.5)
WBC: 13.4 10*3/uL — ABNORMAL HIGH (ref 4.0–10.5)

## 2012-03-28 LAB — BASIC METABOLIC PANEL
BUN: 11 mg/dL (ref 6–23)
BUN: 11 mg/dL (ref 6–23)
CO2: 26 mEq/L (ref 19–32)
Chloride: 85 mEq/L — ABNORMAL LOW (ref 96–112)
Chloride: 82 mEq/L — ABNORMAL LOW (ref 96–112)
Creatinine, Ser: 0.99 mg/dL (ref 0.50–1.35)
GFR calc Af Amer: >90 mL/min (ref >90)
GFR calc non Af Amer: 81 mL/min — ABNORMAL LOW (ref >90)
Glucose, Bld: 105 mg/dL — ABNORMAL HIGH (ref 70–99)
Potassium: 4.3 mEq/L (ref 3.5–5.1)
Sodium: 121 mEq/L — ABNORMAL LOW (ref 135–145)

## 2012-03-28 LAB — URINALYSIS, ROUTINE W REFLEX MICROSCOPIC
Bilirubin Urine: NEGATIVE
Glucose, UA: NEGATIVE mg/dL
Ketones, ur: NEGATIVE mg/dL
Leukocytes, UA: NEGATIVE
Nitrite: NEGATIVE
Specific Gravity, Urine: 1.013 (ref 1.005–1.030)
pH: 7 (ref 5.0–8.0)

## 2012-03-28 LAB — TROPONIN I
Troponin I: 0.69 ng/mL (ref <0.30)
Troponin I: 0.62 ng/mL (ref <0.30)

## 2012-03-28 LAB — CK TOTAL AND CKMB (NOT AT ARMC)
CK, MB: 9 ng/mL (ref 0.3–4.0)
Relative Index: 6.4 — ABNORMAL HIGH (ref 0.0–2.5)

## 2012-03-28 LAB — POTASSIUM: Potassium: 4.3 mEq/L (ref 3.5–5.1)

## 2012-03-28 MED ORDER — LOSARTAN POTASSIUM 50 MG PO TABS
50.0000 mg | ORAL_TABLET | Freq: Two times a day (BID) | ORAL | Status: DC
  Administered 2012-03-28 – 2012-04-01 (×8): 50 mg via ORAL
  Filled 2012-03-28 (×9): qty 1

## 2012-03-28 MED ORDER — IOHEXOL 350 MG/ML SOLN
80.0000 mL | Freq: Once | INTRAVENOUS | Status: AC | PRN
  Administered 2012-03-28: 80 mL via INTRAVENOUS

## 2012-03-28 MED ORDER — FUROSEMIDE 10 MG/ML IJ SOLN
60.0000 mg | Freq: Once | INTRAMUSCULAR | Status: AC
  Administered 2012-03-28: 60 mg via INTRAVENOUS
  Filled 2012-03-28: qty 6

## 2012-03-28 MED ORDER — METOPROLOL TARTRATE 12.5 MG HALF TABLET
12.5000 mg | ORAL_TABLET | Freq: Three times a day (TID) | ORAL | Status: DC
  Administered 2012-03-28 – 2012-04-01 (×12): 12.5 mg via ORAL
  Filled 2012-03-28 (×14): qty 1

## 2012-03-28 MED ORDER — GI COCKTAIL ~~LOC~~
30.0000 mL | Freq: Once | ORAL | Status: AC
  Administered 2012-03-28: 30 mL via ORAL
  Filled 2012-03-28: qty 30

## 2012-03-28 MED ORDER — FUROSEMIDE 10 MG/ML IJ SOLN
INTRAMUSCULAR | Status: AC
  Filled 2012-03-28: qty 8

## 2012-03-28 MED ORDER — FUROSEMIDE 10 MG/ML IJ SOLN
60.0000 mg | Freq: Once | INTRAMUSCULAR | Status: AC
  Administered 2012-03-28: 60 mg via INTRAVENOUS

## 2012-03-28 MED ORDER — FUROSEMIDE 20 MG PO TABS
20.0000 mg | ORAL_TABLET | Freq: Two times a day (BID) | ORAL | Status: DC
  Filled 2012-03-28 (×3): qty 1

## 2012-03-28 NOTE — Consult Note (Signed)
[facility] Consult Note [facility]  Date: 03/28/2012  Patient name: [name] [name] Medical record number: [id number] Date of birth: 03-Sep-1940 Age: 71 y.o. Gender: male PCP: Kaleen Mask, MD  Medical Service: Cardiology  Conulting physician: Zoila Shutter     Chief Complaint: SOB  History of Present Illness: [name] [name] is a 71 yo male with a PMH of HNT, COPD (current smoker-50 pack year history), CVA (2004), PVD, Hyperlipidemia, and femoral bi-pass in 2012. He presented to [facility] ED on 9/24 with SOB x 2 months and was told he had bronchitis. On 9/25 he presented to [facility] in respiratory distress with AMS and was intubated. On 9/26 he was found to have an STEMI and taken to PCI for RCA stent placement which was unsuccessful. On 9/28 he was extubated and was progressing well until this morning when he desated to 77% and went in to acute resp failure.   We are consulted for hyponatremia. On admission his Na was 127 with the rest as follows: 9/27, 131; 9/28, 129; 9/29, 129; 9/30, 127; 10/1, 130; 10/3, 128; 10/4, 127; 10/5, 124; 10/6, 123; 10/7, 121. Review of his previous records indicates some baseline hyponatremia with his values ranging 128-134.   Serum Osmolality from 10/6 was 255, UrOs was 217, Ur Na was 11. Cortisol from 9/26 was 20.5. TSH was 2.5 on 10/4. During admission EF was found to be 30-35%.  It appears the Uosm study was done after he had been on lasix.  Also of note is that he has been on trazadone daily for years.  Meds: Prior to Admission medications   Medication Sig Start Date End Date Taking? Authorizing Provider  amLODipine (NORVASC) 10 MG tablet Take 10 mg by mouth daily.     Yes Historical Provider, MD  clopidogrel (PLAVIX) 75 MG tablet Take 75 mg by mouth daily.   Yes Historical Provider, MD  gabapentin (NEURONTIN) 600 MG tablet Take 600 mg by mouth 2 (two) times daily.    Yes Historical Provider, MD  hydrALAZINE (APRESOLINE) 25  MG tablet Take 25 mg by mouth 3 (three) times daily.     Yes Historical Provider, MD  losartan (COZAAR) 50 MG tablet Take 50 mg by mouth 2 (two) times daily.     Yes Historical Provider, MD  nicotine (NICODERM CQ - DOSED IN MG/24 HOURS) 21 mg/24hr patch Place 1 patch onto the skin daily.   Yes Historical Provider, MD  oxyCODONE-acetaminophen (PERCOCET) 10-650 MG per tablet Take 1 tablet by mouth every 4 (four) hours as needed. For pain 10/06/11  Yes Historical Provider, MD  potassium phosphate, monobasic, (K-PHOS ORIGINAL) 500 MG tablet Take 500 mg by mouth 2 (two) times daily.     Yes Historical Provider, MD  pravastatin (PRAVACHOL) 40 MG tablet Take 40 mg by mouth daily.     Yes Historical Provider, MD  risedronate (ACTONEL) 35 MG tablet Take 35 mg by mouth every 7 (seven) days. Take on Wednesdays.  Take with water on empty stomach, nothing by mouth or lie down for next 30 minutes.   Yes Historical Provider, MD  traZODone (DESYREL) 150 MG tablet Take 150 mg by mouth at bedtime as needed. For sleep   Yes Historical Provider, MD   Current facility-administered medications:0.9 %  sodium chloride infusion, 250 mL, Intravenous, PRN, Jeanella Craze, NP, Last Rate: 10 mL/hr at 03/19/12 2305, 250 mL at 03/19/12 2305;  0.9 %  sodium chloride infusion, , Intravenous, Continuous, Chrystie Nose,  MD, Last Rate: 75 mL/hr at 03/27/12 1100, 75 mL/hr at 03/27/12 1100;  acetaminophen (TYLENOL) tablet 650 mg, 650 mg, Oral, Q4H PRN, Corky Crafts, MD, 650 mg at 03/26/12 2304 albuterol (PROVENTIL) (5 MG/ML) 0.5% nebulizer solution 2.5 mg, 2.5 mg, Nebulization, Q4H PRN, Leslye Peer, MD, 2.5 mg at 03/28/12 1003;  amiodarone (PACERONE) tablet 200 mg, 200 mg, Oral, Daily, Nada Boozer, NP, 200 mg at 03/27/12 1019;  antiseptic oral rinse (BIOTENE) solution 15 mL, 15 mL, Mouth Rinse, BID, Runell Gess, MD, 15 mL at 03/27/12 2200 aspirin chewable tablet 81 mg, 81 mg, Oral, Daily, Corky Crafts, MD, 81 mg at  03/27/12 1113;  atorvastatin (LIPITOR) tablet 20 mg, 20 mg, Oral, q1800, Runell Gess, MD, 20 mg at 03/27/12 1835;  clopidogrel (PLAVIX) tablet 75 mg, 75 mg, Oral, Q breakfast, Corky Crafts, MD, 75 mg at 03/28/12 0640;  demeclocycline (DECLOMYCIN) tablet 300 mg, 300 mg, Oral, BID, Chrystie Nose, MD, 300 mg at 03/27/12 2143 enoxaparin (LOVENOX) injection 40 mg, 40 mg, Subcutaneous, Q24H, Marty Heck, PHARMD, 40 mg at 03/28/12 1012;  fentaNYL (SUBLIMAZE) injection 50 mcg, 50 mcg, Intravenous, Q3H PRN, Coralyn Helling, MD, 50 mcg at 03/27/12 2334;  furosemide (LASIX) injection 60 mg, 60 mg, Intravenous, Once, Nada Boozer, NP, 60 mg at 03/28/12 1023;  gabapentin (NEURONTIN) tablet 600 mg, 600 mg, Oral, BID, Corky Crafts, MD, 600 mg at 03/27/12 2144 gi cocktail (Maalox,Lidocaine,Donnatal), 30 mL, Oral, Once, Nada Boozer, NP, 30 mL at 03/28/12 1026;  isosorbide mononitrate (IMDUR) 24 hr tablet 60 mg, 60 mg, Oral, Daily, Chrystie Nose, MD, 60 mg at 03/27/12 1019;  losartan (COZAAR) tablet 50 mg, 50 mg, Oral, BID, Marykay Lex, MD, 50 mg at 03/27/12 2144;  metoprolol tartrate (LOPRESSOR) tablet 12.5 mg, 12.5 mg, Oral, TID, Runell Gess, MD, 12.5 mg at 03/27/12 2143 morphine 2 MG/ML injection 2 mg, 2 mg, Intravenous, Q2H PRN, Nada Boozer, NP, 2 mg at 03/28/12 1020;  nicotine (NICODERM CQ - dosed in mg/24 hours) patch 21 mg, 21 mg, Transdermal, Daily, Runell Gess, MD, 21 mg at 03/28/12 1025;  nitroGLYCERIN (NITROSTAT) SL tablet 0.4 mg, 0.4 mg, Sublingual, Q5 Min x 3 PRN, Jeanella Craze, NP nitroGLYCERIN (NITROSTAT) SL tablet 0.4 mg, 0.4 mg, Sublingual, Q5 min PRN, Nada Boozer, NP, 0.4 mg at 03/25/12 1214;  ondansetron (ZOFRAN) injection 4 mg, 4 mg, Intravenous, Q6H PRN, Corky Crafts, MD;  pantoprazole (PROTONIX) EC tablet 40 mg, 40 mg, Oral, Q1200, Coralyn Helling, MD, 40 mg at 03/27/12 1323;  potassium chloride SA (K-DUR,KLOR-CON) CR tablet 20 mEq, 20 mEq, Oral, BID, Nada Boozer, NP, 20 mEq at 03/27/12 2142 tiotropium [facility]) inhalation capsule 18 mcg, 18 mcg, Inhalation, Daily, Nada Boozer, NP, 18 mcg at 03/28/12 0743;  traZODone (DESYREL) tablet 75 mg, 75 mg, Oral, QHS, Runell Gess, MD, 75 mg at 03/27/12 2142  Allergies: Ceclor Past Medical History  Diagnosis Date  . Hypertension   . Hernia   . Stroke 2004    minor  . Hyperlipidemia   . Peripheral vascular disease   . Shoulder fracture, left   . COPD (chronic obstructive pulmonary disease)   . At risk for sudden cardiac death, due to [facility] 04-19-12   Past Surgical History  Procedure Date  . Bypass graft 07/2010    axillobifemoral BPG  . Spine surgery 11/23/06    microdiscectomy L5-S1  . Hemorrhoid surgery   . Tonsillectomy   . Hernia  repair 02/09/11    Ventral   Family History  Problem Relation Age of Onset  . Alzheimer's disease Mother   . Cancer Father     prostate  . Cancer Brother     prostate  . Stroke Brother    History   Social History  . Marital Status: Single    Spouse Name: N/A    Number of Children: N/A  . Years of Education: N/A   Occupational History  . Not on file.   Social History Main Topics  . Smoking status: Current Every Day Smoker -- 1.0 packs/day for 50 years    Types: Cigarettes  . Smokeless tobacco: Never Used  . Alcohol Use: 4.2 oz/week    7 Cans of beer per week     2 drinks per day  . Drug Use: No  . Sexually Active: Not on file   Other Topics Concern  . Not on file   Social History Narrative  . No narrative on file    Review of Systems:  Appetite good Breathing better CP earlier this am none now No change in bowels No new arthritic Co's No change in vision No dysuria   Physical Exam: Blood pressure 144/63, pulse 77, temperature 97.4 F (36.3 C), temperature source Axillary, resp. rate 18, height 5\' 6"  (1.676 m), weight 63.776 kg (140 lb 9.6 oz), SpO2 87.00%. BP 144/63  Pulse 77  Temp 97.4 F (36.3 C) (Axillary)  Resp 18   Ht 5\' 6"  (1.676 m)  Wt 63.776 kg (140 lb 9.6 oz)  BMI 22.69 kg/m2  SpO2 87% General appearance: moderate distress and tachypenic Head: Normocephalic, without obvious abnormality, atraumatic Lungs: diminished breath sounds bilaterally and wheezes bilaterally Heart: regular rate and rhythm, S1, S2 normal, no murmur, click, rub or gallop Abdomen: soft, non-tender; bowel sounds normal; no masses,  no organomegaly Extremities: extremities normal, atraumatic, no cyanosis or edema Neuro CNI Ox3 no asterixis  Lab results: Basic Metabolic Panel:  Lab 03/28/12 0454 03/27/12 0555 03/26/12 0500  NA 121* 123* 124*  K 4.3 4.1 3.8  CL 85* 85* 87*  CO2 26 31 30   GLUCOSE 105* 91 100*  BUN 11 12 11   CREATININE 0.95 1.23 0.99  CALCIUM 8.7 8.6 8.7  ALB -- -- --  PHOS -- -- --   CBC:  Lab 03/28/12 0625 03/24/12 0505  WBC 14.2* 10.2  NEUTROABS -- --  HGB 10.6* 10.5*  HCT 31.1* 30.9*  MCV 89.6 89.3  PLT 435* 350   Blood Culture    Component Value Date/Time   SDES SPUTUM 03/27/2012 1117   SDES SPUTUM 03/27/2012 1117   SPECREQUEST NONE 03/27/2012 1117   SPECREQUEST NONE 03/27/2012 1117   CULT NORMAL OROPHARYNGEAL FLORA 03/27/2012 1117   REPTSTATUS 03/27/2012 FINAL 03/27/2012 1117   REPTSTATUS PENDING 03/27/2012 1117    Cardiac Enzymes:  Lab 03/26/12 1247 03/26/12 0500 03/25/12 1230  CKTOTAL 128 -- 108  CKMB 5.5* -- 4.8*  CKMBINDEX -- -- --  TROPONINI 1.24* 1.58* 1.46*    Micro Results: Recent Results (from the past 240 hour(s))  CULTURE, EXPECTORATED SPUTUM-ASSESSMENT     Status: Normal   Collection Time   03/27/12 11:17 AM      Component Value Range Status Comment   Specimen Description SPUTUM   Final    Special Requests NONE   Final    Sputum evaluation     Final    Value: THIS SPECIMEN IS ACCEPTABLE. RESPIRATORY CULTURE REPORT TO FOLLOW.  Report Status 03/27/2012 FINAL   Final   CULTURE, RESPIRATORY     Status: Normal (Preliminary result)   Collection Time   03/27/12 11:17 AM       Component Value Range Status Comment   Specimen Description SPUTUM   Final    Special Requests NONE   Final    Gram Stain PENDING   Incomplete    Culture NORMAL OROPHARYNGEAL FLORA   Final    Report Status PENDING   Incomplete    Studies/Results: Dg Chest Port 1 View  03/28/2012  *RADIOLOGY REPORT*  Clinical Data: Respiratory distress, cough, left-sided chest pain  PORTABLE CHEST - 1 VIEW  Comparison: 03/25/2012  Findings: The cardiac shadow remains mildly enlarged.  A left-sided pleural effusion is again seen. Some slight vascular congestion is noted.  Postoperative changes are seen in the region of the left upper chest.  IMPRESSION: Persistent left pleural effusion.  Mild vascular congestion is now seen.   Original Report Authenticated By: Phillips Odor, M.D.     Assessment & Plan by Problem: [name] [name] is a 71 yo male with a PMH of HNT, COPD (current smoker-50 pack year history), CVA (2004), PVD, Hyperlipidemia, and femoral bi-pass in 2012.  1. Hyponatremia- review of previous records indicates a Na baseline of 128-134. Pts Na has declined over admission from 131 to 121 today. 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 CXR shows edema. This is likely volume related.  - Continue Lasix    - Fluid restrict PO to 1L per day  - Continue to monitor  2. STEMI with unsuccessful RCA PCI- primary team was unable to place stent despite multiple attempts.  - Medical management per primary team  3. COPD- pt current smoker with 50 pack year history who presented in acute resp failure and had to be intubated.  - Smoking cessation counseling  - pt will likely need home O2 given current needs during admission  - continue per primary team    This is a Psychologist, occupational Note.  The care of the patient was discussed with Dr. Briant Cedar and the assessment and plan was formulated with their assistance.  Please see their note for official documentation of the patient encounter.   Signed: V.  L[place], [place], PA-S2 03/28/2012, 11:19 AM  Pt seen and examined.  He has had mild hyponatremia for some time and whether this is due to the trazadone or reset osmostat is difficult to know.  His SNa is certainly lower now with some evidence of volume overload but I cannot R/O an ADH effect.  Though his UOsm was<Sosm (which argues against SIADH), he had received lasix recently and so the results are not helpful.  For now, DC trazadone, fluid restrict to 1l/d, cont demeclocycline for now and cont with lasix.  Daily Sna.  May need to re assess things as outpt once Sna corrects off these medications and see what SNa does.

## 2012-03-28 NOTE — Progress Notes (Signed)
CRITICAL VALUE ALERT  Critical value received:  CKMB 9.0  Date of notification:  03/28/12  Time of notification: 1305  Critical value read back: Yes  Nurse who received alert: Lajuana Matte, RN  MD notified (1st page): Nada Boozer, NP  Time of first page:  1307  MD notified (2nd page):  Time of second page:  Responding MD:   Time MD responded:

## 2012-03-28 NOTE — Progress Notes (Signed)
Pt started having back pain described as sharp and severe occuring along either side of his spine. Pt given Fentanyl 50 mcg at 2026 and 2334. Pain relieved after med given. Pt states pain occurs more often at night, and he thinks it's related to sitting in the chair or lying in the bed for too long. Will continue to monitor.  Alfonso Ellis, RN

## 2012-03-28 NOTE — Progress Notes (Signed)
Name: CORTEZ COAST MRN: [id number] DOB: 1940/10/01    LOS: 12  Referring Provider:  EDP Reason for Referral:  Respiratory Failure     PULMONARY / CRITICAL CARE MEDICINE  HPI:  71 yo smoker admitted 03/16/2012 with progressive dyspnea x 2 weeks, acute encephalopathy, respiratory failure with VDRF.  Found to have STEMI, and had emergent cath 9/25.  Followed by Dr. Delton Coombes as outpt. Today he developed worsening dyspnea, hypoxia requiring increased oxygen administration; he was placed on venit mask at 50%; oxygen saturation being 80-88%.   PMHx HTN, CVA, PVD, Hyperlipidemia, COPD  PFT 10/13/11>>FEV1 2.55 (92%), FEV1% 60, TLC 7.11 (118%), DLCO 68%, no BD  Events Since Admission: 9/25 VDRF, STEMI, A fib 9/25 Cardiac cath>>mild/moderate left-sided disease without apparent hemodynamically significant lesion.  Subtotally, occluded distal RCA, with DES performed.  Medical management. 9/27 - extubated 9/28 - 2  X V tach yesterday post exubation - symptomatic. Needed extra amio bolus. Since then NSR. Eating breakfast. No overnight issues. Daughter at bedside. On 6L Cook o2 but no resp disterss 9/30 on tele floor, NAD. 10/7 acute dyspnea and hypoxia  Subjective: Admits dyspnea, C/O mid back pain -relays that it is similar to pain he had at admission.    Vital Signs: BP 110/45  Pulse 65  Temp 97.5 F (36.4 C) (Oral)  Resp 20  Ht 5\' 8"  (1.727 m)  Wt 142 lb 3.2 oz (64.5 kg)  BMI 21.62 kg/m2  SpO2 90%   Physical Examination: General - no distress, on venti mask HEENT - . PEERL +, no jvd Cardiac - s1,s2 irregular Chest - no wheeze/rales+ rhonchi  Abd - soft, non-tender, +bowel sounds Ext - no edema but RT forearm mid aspect with bruising   Neuro -Alert, follows commands, moves all extremities.  Ct Angio Chest Pe W/cm &/or Wo Cm  03/28/2012  *RADIOLOGY REPORT*  Clinical Data: SOB, hypoxia, chest/back pain  CT ANGIOGRAPHY CHEST  Technique:  Multidetector CT imaging of the chest using the  standard protocol during bolus administration of intravenous contrast. Multiplanar reconstructed images including MIPs were obtained and reviewed to evaluate the vascular anatomy.  Contrast: 80mL OMNIPAQUE IOHEXOL 350 MG/ML SOLN  Comparison: Chest radiograph dated 03/28/2012.  Findings: No evidence of pulmonary embolism.  Mild interstitial edema.  Moderate left and small to moderate right pleural effusions.  Associated compressive atelectasis in the bilateral lower lobes and left upper lobe. No pneumothorax.  The visualized thyroid is mildly heterogeneous.  Cardiomegaly.  Small pericardial effusion.  Coronary atherosclerosis.  Atherosclerotic calcifications of the aortic arch.  Although not tailored for optimal evaluation of the aorta, there is no evidence of aortic dissection.  Mediastinal and left supraclavicular lymphadenopathy, mildly increased, including: --1.4 cm short-axis left supraclavicular node (series 4/image 8) --1.1 cm short axis high left paratracheal node (series 4/image 15) --1.4 cm short-axis right paratracheal node (series 4/image 46) --1.2 cm short-axis subcarinal node (series 4/image 64)  Visualized upper abdomen is unremarkable.  IMPRESSION: No evidence of pulmonary embolism.  No evidence of aortic dissection.  Mild interstitial edema.  Moderate left and small to moderate right pleural effusions.  Cardiomegaly.  Small pericardial effusion.  Mildly enlarged mediastinal/left supraclavicular lymph nodes, as described above.   Original Report Authenticated By: Charline Bills, M.D.    Dg Chest Port 1 View  03/28/2012  *RADIOLOGY REPORT*  Clinical Data: Respiratory distress, cough, left-sided chest pain  PORTABLE CHEST - 1 VIEW  Comparison: 03/25/2012  Findings: The cardiac shadow remains mildly enlarged.  A  left-sided pleural effusion is again seen. Some slight vascular congestion is noted.  Postoperative changes are seen in the region of the left upper chest.  IMPRESSION: Persistent left  pleural effusion.  Mild vascular congestion is now seen.   Original Report Authenticated By: Phillips Odor, M.D.    [facility]     Component Value Date/Time   NA 120* 03/28/2012 1253   K 5.8* 03/28/2012 1253   CL 82* 03/28/2012 1253   CO2 29 03/28/2012 1253   GLUCOSE 109* 03/28/2012 1253   BUN 11 03/28/2012 1253   CREATININE 0.99 03/28/2012 1253   CALCIUM 8.8 03/28/2012 1253   PROT 5.8* 03/19/2012 0500   ALBUMIN 2.7* 03/19/2012 0500   AST 94* 03/19/2012 0500   ALT 45 03/19/2012 0500   ALKPHOS 58 03/19/2012 0500   BILITOT 0.2* 03/19/2012 0500   GFRNONAA 81* 03/28/2012 1253   GFRAA >90 03/28/2012 1253   CBC    Component Value Date/Time   WBC 13.4* 03/28/2012 1253   RBC 3.77* 03/28/2012 1253   HGB 11.6* 03/28/2012 1253   HCT 33.4* 03/28/2012 1253   PLT 394 03/28/2012 1253   MCV 88.6 03/28/2012 1253   MCH 30.8 03/28/2012 1253   MCHC 34.7 03/28/2012 1253   RDW 13.3 03/28/2012 1253   LYMPHSABS 0.9 03/16/2012 0937   MONOABS 2.2* 03/16/2012 0937   EOSABS 0.0 03/16/2012 0937   BASOSABS 0.0 03/16/2012 0937    Current Facility-Administered Medications  Medication Dose Route Frequency Provider Last Rate Last Dose  . 0.9 %  sodium chloride infusion  250 mL Intravenous PRN Jeanella Craze, NP 10 mL/hr at 03/19/12 2305 250 mL at 03/19/12 2305  . acetaminophen (TYLENOL) tablet 650 mg  650 mg Oral Q4H PRN Corky Crafts, MD   650 mg at 03/26/12 2304  . albuterol (PROVENTIL) (5 MG/ML) 0.5% nebulizer solution 2.5 mg  2.5 mg Nebulization Q4H PRN Leslye Peer, MD   2.5 mg at 03/28/12 1003  . amiodarone (PACERONE) tablet 200 mg  200 mg Oral Daily Nada Boozer, NP   200 mg at 03/28/12 1220  . antiseptic oral rinse (BIOTENE) solution 15 mL  15 mL Mouth Rinse BID Runell Gess, MD   15 mL at 03/28/12 1220  . aspirin chewable tablet 81 mg  81 mg Oral Daily Corky Crafts, MD   81 mg at 03/28/12 1217  . atorvastatin (LIPITOR) tablet 20 mg  20 mg Oral q1800 Runell Gess, MD   20 mg at 03/27/12 1835  .  clopidogrel (PLAVIX) tablet 75 mg  75 mg Oral Q breakfast Corky Crafts, MD   75 mg at 03/28/12 0640  . demeclocycline (DECLOMYCIN) tablet 300 mg  300 mg Oral BID Chrystie Nose, MD   300 mg at 03/28/12 1219  . enoxaparin (LOVENOX) injection 40 mg  40 mg Subcutaneous Q24H Marty Heck, PHARMD   40 mg at 03/28/12 1012  . fentaNYL (SUBLIMAZE) injection 50 mcg  50 mcg Intravenous Q3H PRN Coralyn Helling, MD   50 mcg at 03/27/12 2334  . furosemide (LASIX) 10 MG/ML injection           . furosemide (LASIX) injection 60 mg  60 mg Intravenous Once Nada Boozer, NP   60 mg at 03/28/12 1023  . furosemide (LASIX) injection 60 mg  60 mg Intravenous Once Nada Boozer, NP   60 mg at 03/28/12 1312  . furosemide (LASIX) tablet 20 mg  20 mg Oral BID Inocente Salles  Briant Cedar, MD      . gabapentin (NEURONTIN) tablet 600 mg  600 mg Oral BID Corky Crafts, MD   600 mg at 03/28/12 1218  . gi cocktail (Maalox,Lidocaine,Donnatal)  30 mL Oral Once Nada Boozer, NP   30 mL at 03/28/12 1026  . iohexol (OMNIPAQUE) 350 MG/ML injection 80 mL  80 mL Intravenous Once PRN Medication Radiologist, MD   80 mL at 03/28/12 1146  . isosorbide mononitrate (IMDUR) 24 hr tablet 60 mg  60 mg Oral Daily Chrystie Nose, MD   60 mg at 03/28/12 1221  . losartan (COZAAR) tablet 50 mg  50 mg Oral BID Nada Boozer, NP      . metoprolol tartrate (LOPRESSOR) tablet 12.5 mg  12.5 mg Oral TID Nada Boozer, NP      . morphine 2 MG/ML injection 2 mg  2 mg Intravenous Q2H PRN Nada Boozer, NP   2 mg at 03/28/12 1020  . nicotine (NICODERM CQ - dosed in mg/24 hours) patch 21 mg  21 mg Transdermal Daily Runell Gess, MD   21 mg at 03/28/12 1025  . nitroGLYCERIN (NITROSTAT) SL tablet 0.4 mg  0.4 mg Sublingual Q5 Min x 3 PRN Jeanella Craze, NP      . ondansetron (ZOFRAN) injection 4 mg  4 mg Intravenous Q6H PRN Corky Crafts, MD      . pantoprazole (PROTONIX) EC tablet 40 mg  40 mg Oral Q1200 Coralyn Helling, MD   40 mg at 03/28/12 1218  .  tiotropium (SPIRIVA) inhalation capsule 18 mcg  18 mcg Inhalation Daily Nada Boozer, NP   18 mcg at 03/28/12 0743  . DISCONTD: 0.9 %  sodium chloride infusion   Intravenous Continuous Chrystie Nose, MD 75 mL/hr at 03/27/12 1100 75 mL/hr at 03/27/12 1100  . DISCONTD: losartan (COZAAR) tablet 50 mg  50 mg Oral BID Marykay Lex, MD   50 mg at 03/27/12 2144  . DISCONTD: metoprolol tartrate (LOPRESSOR) tablet 12.5 mg  12.5 mg Oral TID Runell Gess, MD   12.5 mg at 03/27/12 2143  . DISCONTD: nitroGLYCERIN (NITROSTAT) SL tablet 0.4 mg  0.4 mg Sublingual Q5 min PRN Nada Boozer, NP   0.4 mg at 03/25/12 1214  . DISCONTD: potassium chloride SA (K-DUR,KLOR-CON) CR tablet 20 mEq  20 mEq Oral BID Nada Boozer, NP   20 mEq at 03/28/12 1219  . DISCONTD: traZODone (DESYREL) tablet 75 mg  75 mg Oral QHS Runell Gess, MD   75 mg at 03/27/12 2142    ASSESSMENT AND PLAN   Acute respiratory distress, requiring 50% venti mask,  most likely secondary to pulmonary edema. Doubt pneumonia, but WBC slightly elevated; with any suspicion of infection start broad spectrum antibiotics;however no evidence of PNA on CTA, also CTA negative for PE. Responding to diuresis. We will transfer to step down for better monitoring. Exam with rhonchi/crackles no increased wheezing, do not suspect COPD exacerbation.   COPD cont Spiriva and Albuterol. Recommend f/u with Dr. Delton Coombes as outpatient.   CAD defer to cardiology.   AFib on amiodarone. I do not think Amiodarone toxicity contributory to respiratory symptoms.   B pleural effusions most likely due to congestive heart failure. Would continue monitoring with intermittent CXR. No need for thoracentesis for now; if persistent we will reconsider.   Hyponatremia suspect due to heart failure; possible medication related. Would continue Lasix.   Prophylaxis:  DVT Lovenox SUP Protonix  We will continue to follow  up. Please call with any questions.    Vanetta Mulders,  M.D. [phone]

## 2012-03-28 NOTE — Progress Notes (Addendum)
Pt. Seen and examined. Agree with the NP/PA-C note as written.  He is markedly more dyspneic today, low O2 saturation. CXR shows bilateral pulmonary edema. He is also complaining of chest and back pain which is severe, there is clinical concern for possible PE with hypoxia (he has been on prophylactic lovenox) or aortic dissection. Will give 60 mg IV lasix now. STAT CTA to r/o PE and dissection, if PE is confirmed, would proceed with treatment dose lovenox. He is becoming more hyponatremic, despite demeclocyline. Will re-consult PCCM and ask renal to formally see for hyponatremia. May need ICU transfer at some point.  Chrystie Nose, MD, [facility],The Attending Cardiologist [facility]

## 2012-03-28 NOTE — Progress Notes (Signed)
PT Cancellation Note  Patient Details Name: [name] [name] MRN: [id number] DOB: 09-Oct-1940   Cancelled Treatment:    Reason Eval/Treat Not Completed: Medical issues which prohibited therapy. Pt with respiratory issues this AM.   [name] 03/28/2012, 10:03 AM

## 2012-03-28 NOTE — Progress Notes (Signed)
Subjective: Had back pain again last pm.  Now acute resp. Failure with desat to 77% on 3 L [place].  Now on venturi mask.   Objective: Vital signs in last 24 hours: Temp:  [97.2 F (36.2 C)-97.7 F (36.5 C)] 97.7 F (36.5 C) (10/07 0453) Pulse Rate:  [55-65] 64  (10/07 0453) Resp:  [18-20] 18  (10/07 0453) BP: (100-130)/(52-64) 116/64 mmHg (10/07 0453) SpO2:  [86 %-96 %] 86 % (10/07 0743) Weight:  [63.776 kg (140 lb 9.6 oz)] 63.776 kg (140 lb 9.6 oz) (10/07 0645) Weight change: -1.179 kg (-2 lb 9.6 oz) Last BM Date: 03/28/12 Intake/Output from previous day: -600 10/06 0701 - 10/07 0700 In: -  Out: 600 [Urine:600] Intake/Output this shift: Total I/O In: 240 [P.O.:240] Out: 400 [Urine:400]  PE: General:alert and oriented, +SOB  Heart:S1S2 RRR Lungs:+ rales + rhonchi Abd:+ BS, soft, nontender Ext:no edema Neuro:alert and oriented X 3   Lab Results:  [facility] 03/28/12 0625  WBC 14.2*  HGB 10.6*  HCT 31.1*  PLT 435*   BMET  Basename 03/28/12 0625 03/27/12 0555  NA 121* 123*  K 4.3 4.1  CL 85* 85*  CO2 26 31  GLUCOSE 105* 91  BUN 11 12  CREATININE 0.95 1.23  CALCIUM 8.7 8.6    Basename 03/26/12 1247 03/26/12 0500  TROPONINI 1.24* 1.58*    Lab Results  Component Value Date   CHOL 123 03/17/2012   HDL 48 03/17/2012   LDLCALC 58 03/17/2012   TRIG 85 03/17/2012   CHOLHDL 2.6 03/17/2012   Lab Results  Component Value Date   HGBA1C 5.4 03/17/2012     Lab Results  Component Value Date   TSH 2.506 03/25/2012     EKG: Orders placed during the hospital encounter of 03/16/12  . EKG 12-LEAD  . EKG 12-LEAD  . EKG 12-LEAD  . EKG 12-LEAD  . EKG 12-LEAD  . EKG 12-LEAD  . EKG 12-LEAD  . EKG 12-LEAD  . EKG 12-LEAD  . EKG 12-LEAD  . EKG 12-LEAD  . EKG 12-LEAD  . EKG 12-LEAD  . EKG 12-LEAD  . EKG 12-LEAD  . EKG 12-LEAD  . EKG 12-LEAD  . EKG 12-LEAD  . EKG 12-LEAD  . EKG 12-LEAD  . EKG 12-LEAD  . EKG 12-LEAD  . EKG 12-LEAD  . EKG 12-LEAD  . EKG  . EKG  12-LEAD  . EKG 12-LEAD  . EKG 12-LEAD  . EKG 12-LEAD  . EKG 12-LEAD  . EKG 12-LEAD    Studies/Results: No results found.  Medications: I have reviewed the patient's current medications.    Marland Kitchen amiodarone  200 mg Oral Daily  . antiseptic oral rinse  15 mL Mouth Rinse BID  . aspirin  81 mg Oral Daily  . atorvastatin  20 mg Oral q1800  . clopidogrel  75 mg Oral Q breakfast  . demeclocycline  300 mg Oral BID  . enoxaparin (LOVENOX) injection  40 mg Subcutaneous Q24H  . gabapentin  600 mg Oral BID  . isosorbide mononitrate  60 mg Oral Daily  . losartan  50 mg Oral BID  . metoprolol tartrate  12.5 mg Oral TID  . nicotine  21 mg Transdermal Daily  . pantoprazole  40 mg Oral Q1200  . potassium chloride  20 mEq Oral BID  . tiotropium  18 mcg Inhalation Daily  . traZODone  75 mg Oral QHS   Assessment/Plan: Principal Problem:  *STEMI, unsuccessful RCA PCI 03/17/12 Active Problems:  Tobacco  abuse  Acute respiratory failure  Acute pulmonary edema  Hypoxemia  COPD with respiratory failure, acute  PVD, history of Lt Ax-fem and fem-fem BPG in past, Dr Manson Passey follows  HTN (hypertension)  Dyslipidemia  History of CVA (cerebrovascular accident)  PAF, post MI  NSVT, post MI  Ischemic cardiomyopathy, EF 30-35% by echo  Other encephalopathy, Acute, secondary to hypotension and resp. failure on admit now resolved at discharge  At risk for sudden cardiac death, due to ICM, d/c'd with lifevest  Acute combined systolic and diastolic heart failure - in setting of Inferior STEMI  Hypoxia - with ambulation, SaO2 to 87% on RA.; not previously on Home O2. COPD + ICM  Hyposmolality and/or hyponatremia  PLAN: Na continues to drop.   Now with acte resp. Failure with desat to 77%, on venturi mask and RT coming for neb treatment. Check CXR, WBC elevated. Check u/a also Complains of indigestion and had back pain last pm.  Add GI cocktail.  Check EKG.  LOS: 12 days   [name],[name] 03/28/2012, 9:10  AM

## 2012-03-29 LAB — RENAL FUNCTION PANEL
Albumin: 3.4 g/dL — ABNORMAL LOW (ref 3.5–5.2)
BUN: 12 mg/dL (ref 6–23)
Calcium: 8.8 mg/dL (ref 8.4–10.5)
Creatinine, Ser: 1.09 mg/dL (ref 0.50–1.35)
Glucose, Bld: 104 mg/dL — ABNORMAL HIGH (ref 70–99)
Phosphorus: 4 mg/dL (ref 2.3–4.6)

## 2012-03-29 LAB — CULTURE, RESPIRATORY W GRAM STAIN: Gram Stain: NONE SEEN

## 2012-03-29 MED ORDER — FUROSEMIDE 10 MG/ML IJ SOLN
INTRAMUSCULAR | Status: AC
  Filled 2012-03-29: qty 4

## 2012-03-29 MED ORDER — FUROSEMIDE 40 MG PO TABS
40.0000 mg | ORAL_TABLET | Freq: Two times a day (BID) | ORAL | Status: DC
  Administered 2012-03-29 – 2012-04-01 (×6): 40 mg via ORAL
  Filled 2012-03-29 (×7): qty 1

## 2012-03-29 MED ORDER — FUROSEMIDE 10 MG/ML IJ SOLN
40.0000 mg | Freq: Once | INTRAMUSCULAR | Status: AC
  Administered 2012-03-29: 40 mg via INTRAVENOUS
  Filled 2012-03-29: qty 4

## 2012-03-29 MED ORDER — FUROSEMIDE 10 MG/ML IJ SOLN
40.0000 mg | Freq: Two times a day (BID) | INTRAMUSCULAR | Status: DC
  Administered 2012-03-29: 40 mg via INTRAVENOUS
  Filled 2012-03-29: qty 4

## 2012-03-29 MED ORDER — LEVALBUTEROL HCL 0.63 MG/3ML IN NEBU
0.6300 mg | INHALATION_SOLUTION | Freq: Four times a day (QID) | RESPIRATORY_TRACT | Status: DC | PRN
  Administered 2012-03-29: 0.63 mg via RESPIRATORY_TRACT
  Filled 2012-03-29 (×2): qty 3

## 2012-03-29 NOTE — Progress Notes (Signed)
1145 Will continue to follow. Please address activity level when pt stable enough to ambulate.Sulayman Manning DunlapRN

## 2012-03-29 NOTE — Progress Notes (Signed)
PA Student Daily Progress Note [name] Subjective: Pt states he had a hard time sleeping last night. He is still having a hard time breathing with decreased O2 sats. Denies chest pain, nausea, and headache. No new complaints.  Objective:  Filed Vitals:   03/29/12 0600 03/29/12 0630 03/29/12 0700 03/29/12 0724  BP: 139/70 139/70    Pulse: 67 75 64   Temp:    97.5 F (36.4 C)  TempSrc:    Oral  Resp: 22 25 21    Height:      Weight:      SpO2: 88% 91% 95%    Physical Exam: General appearance: Sitting up in bed eating, NAD  Lungs: diminished breath sounds bilaterally and wheezes bilaterally  Heart: regular rate and rhythm, S1, S2 normal, no murmur, click, rub or gallop  Abdomen: soft, non-tender; bowel sounds normal; no masses, no organomegaly  Extremities: extremities normal, atraumatic, no cyanosis or edema  Neuro: grossly intact  Assessment/Plan: [name] [name] is a 71 yo male with a PMH of HNT, COPD (current smoker-50 pack year history), CVA (2004), PVD, Hyperlipidemia, and femoral bi-pass in 2012.   1. Hyponatremia- review of previous records indicates a Na baseline of 128-134. Pts Na has declined over admission from 131 to 121 yesterday. 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 CXR shows edema. This is likely volume related. Last night was Na 126 and this morning 125 which is getting closer to his baseline.  - Continue Lasix PO  - Fluid restrict PO to 1L per day   - Continue to monitor   - discontinue Trazadone-  May need something else to help him sleep  2. STEMI with unsuccessful RCA PCI- primary team was unable to place stent despite multiple attempts.   - Medical management per primary team   3. COPD- pt current smoker with 50 pack year history who presented in acute resp failure and had to be intubated.   - Smoking cessation counseling   - pt will likely need home O2 given current needs during admission   - continue per primary  team  Labs: Basic Metabolic Panel:  Lab 03/29/12 1610 03/28/12 1744 03/28/12 1253 03/28/12 0625  NA 125* 126* 120* --  K 4.3 4.3 5.8* --  CL 86* -- 82* 85*  CO2 29 -- 29 26  GLUCOSE 104* -- 109* 105*  BUN 12 -- 11 11  CREATININE 1.09 -- 0.99 0.95  CALCIUM 8.8 -- 8.8 8.7  ALB -- -- -- --  PHOS 4.0 -- -- --   Liver Function Tests:  Lab 03/29/12 0522  AST --  ALT --  ALKPHOS --  BILITOT --  PROT --  ALBUMIN 3.4*   No results found for this basename: LIPASE:3,AMYLASE:3 in the last 168 hours No results found for this basename: AMMONIA:3 in the last 168 hours INR: @resultsinr3 @ CBC:  Lab 03/28/12 1253 03/28/12 0625 03/24/12 0505  WBC 13.4* 14.2* 10.2  NEUTROABS -- -- --  HGB 11.6* 10.6* 10.5*  HCT 33.4* 31.1* 30.9*  MCV 88.6 89.6 89.3  PLT 394 435* 350   Blood Culture    Component Value Date/Time   SDES SPUTUM 03/27/2012 1117   SDES SPUTUM 03/27/2012 1117   SPECREQUEST NONE 03/27/2012 1117   SPECREQUEST NONE 03/27/2012 1117   CULT NORMAL OROPHARYNGEAL FLORA 03/27/2012 1117   REPTSTATUS 03/27/2012 FINAL 03/27/2012 1117   REPTSTATUS PENDING 03/27/2012 1117    Cardiac Enzymes:  Lab 03/28/12 2157 03/28/12 1744 03/28/12  1158 03/26/12 1247 03/26/12 0500 03/25/12 1230  CKTOTAL -- -- 140 128 -- 108  CKMB -- -- 9.0* 5.5* -- 4.8*  CKMBINDEX -- -- -- -- -- --  TROPONINI 0.62* 0.46* 0.69* 1.24* 1.58* --   CBG: No results found for this basename: GLUCAP:5 in the last 168 hours Iron Studies: No results found for this basename: IRON,TIBC,TRANSFERRIN,FERRITIN in the last 72 hours  Micro Results: Recent Results (from the past 240 hour(s))  CULTURE, EXPECTORATED SPUTUM-ASSESSMENT     Status: Normal   Collection Time   03/27/12 11:17 AM      Component Value Range Status Comment   Specimen Description SPUTUM   Final    Special Requests NONE   Final    Sputum evaluation     Final    Value: THIS SPECIMEN IS ACCEPTABLE. RESPIRATORY CULTURE REPORT TO FOLLOW.   Report Status  03/27/2012 FINAL   Final   CULTURE, RESPIRATORY     Status: Normal (Preliminary result)   Collection Time   03/27/12 11:17 AM      Component Value Range Status Comment   Specimen Description SPUTUM   Final    Special Requests NONE   Final    Gram Stain     Final    Value: NO WBC SEEN     RARE SQUAMOUS EPITHELIAL CELLS PRESENT     RARE GRAM POSITIVE RODS     RARE GRAM POSITIVE COCCI     IN PAIRS RARE GRAM NEGATIVE RODS   Culture NORMAL OROPHARYNGEAL FLORA   Final    Report Status PENDING   Incomplete    Studies/Results: Ct Angio Chest Pe W/cm &/or Wo Cm  03/28/2012  *RADIOLOGY REPORT*  Clinical Data: SOB, hypoxia, chest/back pain  CT ANGIOGRAPHY CHEST  Technique:  Multidetector CT imaging of the chest using the standard protocol during bolus administration of intravenous contrast. Multiplanar reconstructed images including MIPs were obtained and reviewed to evaluate the vascular anatomy.  Contrast: 80mL OMNIPAQUE IOHEXOL 350 MG/ML SOLN  Comparison: Chest radiograph dated 03/28/2012.  Findings: No evidence of pulmonary embolism.  Mild interstitial edema.  Moderate left and small to moderate right pleural effusions.  Associated compressive atelectasis in the bilateral lower lobes and left upper lobe. No pneumothorax.  The visualized thyroid is mildly heterogeneous.  Cardiomegaly.  Small pericardial effusion.  Coronary atherosclerosis.  Atherosclerotic calcifications of the aortic arch.  Although not tailored for optimal evaluation of the aorta, there is no evidence of aortic dissection.  Mediastinal and left supraclavicular lymphadenopathy, mildly increased, including: --1.4 cm short-axis left supraclavicular node (series 4/image 8) --1.1 cm short axis high left paratracheal node (series 4/image 15) --1.4 cm short-axis right paratracheal node (series 4/image 46) --1.2 cm short-axis subcarinal node (series 4/image 64)  Visualized upper abdomen is unremarkable.  IMPRESSION: No evidence of pulmonary  embolism.  No evidence of aortic dissection.  Mild interstitial edema.  Moderate left and small to moderate right pleural effusions.  Cardiomegaly.  Small pericardial effusion.  Mildly enlarged mediastinal/left supraclavicular lymph nodes, as described above.   Original Report Authenticated By: Charline Bills, M.D.    Dg Chest Port 1 View  03/28/2012  *RADIOLOGY REPORT*  Clinical Data: Respiratory distress, cough, left-sided chest pain  PORTABLE CHEST - 1 VIEW  Comparison: 03/25/2012  Findings: The cardiac shadow remains mildly enlarged.  A left-sided pleural effusion is again seen. Some slight vascular congestion is noted.  Postoperative changes are seen in the region of the left upper chest.  IMPRESSION: Persistent  left pleural effusion.  Mild vascular congestion is now seen.   Original Report Authenticated By: Phillips Odor, M.D.    Medications: Scheduled Meds:   . amiodarone  200 mg Oral Daily  . antiseptic oral rinse  15 mL Mouth Rinse BID  . aspirin  81 mg Oral Daily  . atorvastatin  20 mg Oral q1800  . clopidogrel  75 mg Oral Q breakfast  . demeclocycline  300 mg Oral BID  . enoxaparin (LOVENOX) injection  40 mg Subcutaneous Q24H  . furosemide      . furosemide  40 mg Intravenous Once  . furosemide  40 mg Intravenous BID  . furosemide  60 mg Intravenous Once  . furosemide  60 mg Intravenous Once  . furosemide  20 mg Oral BID  . gabapentin  600 mg Oral BID  . gi cocktail  30 mL Oral Once  . isosorbide mononitrate  60 mg Oral Daily  . losartan  50 mg Oral BID  . metoprolol tartrate  12.5 mg Oral TID  . nicotine  21 mg Transdermal Daily  . pantoprazole  40 mg Oral Q1200  . tiotropium  18 mcg Inhalation Daily  . DISCONTD: losartan  50 mg Oral BID  . DISCONTD: metoprolol tartrate  12.5 mg Oral TID  . DISCONTD: potassium chloride  20 mEq Oral BID  . DISCONTD: traZODone  75 mg Oral QHS   Continuous Infusions:   . DISCONTD: sodium chloride 75 mL/hr (03/27/12 1100)   PRN  Meds:.sodium chloride, acetaminophen, albuterol, fentaNYL, iohexol, levalbuterol, morphine injection, nitroGLYCERIN, ondansetron (ZOFRAN) IV, DISCONTD: nitroGLYCERIN   This is a Psychologist, occupational Note.  The care of the patient was discussed with Dr. Briant Cedar and the assessment and plan formulated with their assistance.  Please see their attached note for official documentation of the daily encounter.  Aniceto Boss, MA, PA-S2 03/29/2012, 8:52 AM Pt seen and examined.  Good UO yest and Sna improved.  Will switch to po lasix and cont demeclocycline for now.  Recheck labs in AM

## 2012-03-29 NOTE — Progress Notes (Signed)
Subjective: Decreased Sp02 this AM IV Lasix given and neb with Xopenex  Objective: Vital signs in last 24 hours: Temp:  [97.4 F (36.3 C)-98.1 F (36.7 C)] 97.5 F (36.4 C) (10/08 0724) Pulse Rate:  [53-77] 64  (10/08 0700) Resp:  [14-25] 21  (10/08 0700) BP: (96-144)/(43-70) 139/70 mmHg (10/08 0630) SpO2:  [86 %-95 %] 95 % (10/08 0700) FiO2 (%):  [40 %] 40 % (10/07 1445) Weight:  [62.1 kg (136 lb 14.5 oz)-64.5 kg (142 lb 3.2 oz)] 62.1 kg (136 lb 14.5 oz) (10/08 0454) Weight change: 0.724 kg (1 lb 9.6 oz) Last BM Date: 03/28/12 Intake/Output from previous day: -3375 10/07 0701 - 10/08 0700 In: 800 [P.O.:800] Out: 4175 [Urine:4175] Intake/Output this shift:    PE: per Dr. Allyson Sabal  Neck- No increased JVP Cor- RRR w/o MGR Resp- Bibasilar rales Ext- No edema   Lab Results:  Basename 03/28/12 1253 03/28/12 0625  WBC 13.4* 14.2*  HGB 11.6* 10.6*  HCT 33.4* 31.1*  PLT 394 435*   BMET  Basename 03/29/12 0522 03/28/12 1744 03/28/12 1253  NA 125* 126* --  K 4.3 4.3 --  CL 86* -- 82*  CO2 29 -- 29  GLUCOSE 104* -- 109*  BUN 12 -- 11  CREATININE 1.09 -- 0.99  CALCIUM 8.8 -- 8.8    Basename 03/28/12 2157 03/28/12 1744  TROPONINI 0.62* 0.46*    Lab Results  Component Value Date   CHOL 123 03/17/2012   HDL 48 03/17/2012   LDLCALC 58 03/17/2012   TRIG 85 03/17/2012   CHOLHDL 2.6 03/17/2012   Lab Results  Component Value Date   HGBA1C 5.4 03/17/2012     Lab Results  Component Value Date   TSH 2.506 03/25/2012    Hepatic Function Panel  Basename 03/29/12 0522  PROT --  ALBUMIN 3.4*  AST --  ALT --  ALKPHOS --  BILITOT --  BILIDIR --  IBILI --   No results found for this basename: CHOL in the last 72 hours No results found for this basename: PROTIME in the last 72 hours    EKG: Orders placed during the hospital encounter of 03/16/12  . EKG 12-LEAD  . EKG 12-LEAD  . EKG 12-LEAD  . EKG 12-LEAD  . EKG 12-LEAD  . EKG 12-LEAD  . EKG 12-LEAD  . EKG  12-LEAD  . EKG 12-LEAD  . EKG 12-LEAD  . EKG 12-LEAD  . EKG 12-LEAD  . EKG 12-LEAD  . EKG 12-LEAD  . EKG 12-LEAD  . EKG 12-LEAD  . EKG 12-LEAD  . EKG 12-LEAD  . EKG 12-LEAD  . EKG 12-LEAD  . EKG 12-LEAD  . EKG 12-LEAD  . EKG 12-LEAD  . EKG 12-LEAD  . EKG  . EKG 12-LEAD  . EKG 12-LEAD  . EKG 12-LEAD  . EKG 12-LEAD  . EKG 12-LEAD  . EKG 12-LEAD  . EKG 12-LEAD  . EKG 12-LEAD    Studies/Results: Ct Angio Chest Pe W/cm &/or Wo Cm  03/28/2012  *RADIOLOGY REPORT*  Clinical Data: SOB, hypoxia, chest/back pain  CT ANGIOGRAPHY CHEST  Technique:  Multidetector CT imaging of the chest using the standard protocol during bolus administration of intravenous contrast. Multiplanar reconstructed images including MIPs were obtained and reviewed to evaluate the vascular anatomy.  Contrast: 80mL OMNIPAQUE IOHEXOL 350 MG/ML SOLN  Comparison: Chest radiograph dated 03/28/2012.  Findings: No evidence of pulmonary embolism.  Mild interstitial edema.  Moderate left and small to moderate right pleural effusions.  Associated compressive atelectasis in the bilateral lower lobes and left upper lobe. No pneumothorax.  The visualized thyroid is mildly heterogeneous.  Cardiomegaly.  Small pericardial effusion.  Coronary atherosclerosis.  Atherosclerotic calcifications of the aortic arch.  Although not tailored for optimal evaluation of the aorta, there is no evidence of aortic dissection.  Mediastinal and left supraclavicular lymphadenopathy, mildly increased, including: --1.4 cm short-axis left supraclavicular node (series 4/image 8) --1.1 cm short axis high left paratracheal node (series 4/image 15) --1.4 cm short-axis right paratracheal node (series 4/image 46) --1.2 cm short-axis subcarinal node (series 4/image 64)  Visualized upper abdomen is unremarkable.  IMPRESSION: No evidence of pulmonary embolism.  No evidence of aortic dissection.  Mild interstitial edema.  Moderate left and small to moderate right pleural  effusions.  Cardiomegaly.  Small pericardial effusion.  Mildly enlarged mediastinal/left supraclavicular lymph nodes, as described above.   Original Report Authenticated By: Charline Bills, M.D.    Dg Chest Port 1 View  03/28/2012  *RADIOLOGY REPORT*  Clinical Data: Respiratory distress, cough, left-sided chest pain  PORTABLE CHEST - 1 VIEW  Comparison: 03/25/2012  Findings: The cardiac shadow remains mildly enlarged.  A left-sided pleural effusion is again seen. Some slight vascular congestion is noted.  Postoperative changes are seen in the region of the left upper chest.  IMPRESSION: Persistent left pleural effusion.  Mild vascular congestion is now seen.   Original Report Authenticated By: Phillips Odor, M.D.     Medications: I have reviewed the patient's current medications.    Marland Kitchen amiodarone  200 mg Oral Daily  . antiseptic oral rinse  15 mL Mouth Rinse BID  . aspirin  81 mg Oral Daily  . atorvastatin  20 mg Oral q1800  . clopidogrel  75 mg Oral Q breakfast  . demeclocycline  300 mg Oral BID  . enoxaparin (LOVENOX) injection  40 mg Subcutaneous Q24H  . furosemide      . furosemide  40 mg Intravenous Once  . furosemide  60 mg Intravenous Once  . furosemide  60 mg Intravenous Once  . furosemide  20 mg Oral BID  . gabapentin  600 mg Oral BID  . gi cocktail  30 mL Oral Once  . isosorbide mononitrate  60 mg Oral Daily  . losartan  50 mg Oral BID  . metoprolol tartrate  12.5 mg Oral TID  . nicotine  21 mg Transdermal Daily  . pantoprazole  40 mg Oral Q1200  . tiotropium  18 mcg Inhalation Daily  . DISCONTD: losartan  50 mg Oral BID  . DISCONTD: metoprolol tartrate  12.5 mg Oral TID  . DISCONTD: potassium chloride  20 mEq Oral BID  . DISCONTD: traZODone  75 mg Oral QHS   Assessment/Plan: Principal Problem:  *STEMI, unsuccessful RCA PCI 03/17/12 Active Problems:  Tobacco abuse  Acute respiratory failure, on admit and recurrent 03/28/12  Acute pulmonary edema  Hypoxemia  COPD  with respiratory failure, acute  PVD, history of Lt Ax-fem and fem-fem BPG in past, Dr Manson Passey follows  HTN (hypertension)  Dyslipidemia  History of CVA (cerebrovascular accident)  PAF, post MI  NSVT, post MI  Ischemic cardiomyopathy, EF 30-35% by echo  Other encephalopathy, Acute, secondary to hypotension and resp. failure on admit now resolved at discharge  At risk for sudden cardiac death, due to ICM, d/c'd with lifevest  Acute combined systolic and diastolic heart failure - in setting of Inferior STEMI  Hypoxia - with ambulation, SaO2 to 87% on RA.; not  previously on Home O2. COPD + ICM  Hyposmolality and/or hyponatremia  PLAN: No real bump in troponin I except what could be expected with acute HF.  Na improved.  CT of chest without dissection or  PE.  + PL effusion- desat yesterday and responded well to Lasix.  Pt transferred to stepdown for closer monitoring.    Maintaing SR on Amiodarone, PAF on admit.  Pulmonary also following, Renal is following for hyponatremia.   LOS: 13 days   [name],[name] 03/29/2012, 7:38 AM   Agree with note written by Nada Boozer RNP  Recurrent CHF. Pt has had recurrent CP/Back Pain. Was on NRB last PM. Diuresing. SNa slowly increasing. Will check 2D for any mechanical issue like flail MV etc... Increase IV diuresis. Follow lytes/BNP.   Runell Gess 03/29/2012 8:06 AM

## 2012-03-29 NOTE — Progress Notes (Signed)
Nutrition Follow-up  Intervention:    No nutrition intervention warranted at this time RD to follow for nutrition care plan  Assessment:   Patient extubated 9/27. States his appetite is good. PO intake 75-100% per flowsheet records.  Diet Order:  Heart Healthy, 1000 ml fluid restriction   Meds: Scheduled Meds:    . amiodarone  200 mg Oral Daily  . antiseptic oral rinse  15 mL Mouth Rinse BID  . aspirin  81 mg Oral Daily  . atorvastatin  20 mg Oral q1800  . clopidogrel  75 mg Oral Q breakfast  . demeclocycline  300 mg Oral BID  . enoxaparin (LOVENOX) injection  40 mg Subcutaneous Q24H  . furosemide      . furosemide  40 mg Intravenous Once  . furosemide  40 mg Oral BID  . gabapentin  600 mg Oral BID  . isosorbide mononitrate  60 mg Oral Daily  . losartan  50 mg Oral BID  . metoprolol tartrate  12.5 mg Oral TID  . nicotine  21 mg Transdermal Daily  . pantoprazole  40 mg Oral Q1200  . tiotropium  18 mcg Inhalation Daily  . DISCONTD: furosemide  40 mg Intravenous BID  . DISCONTD: furosemide  20 mg Oral BID  . DISCONTD: potassium chloride  20 mEq Oral BID   Continuous Infusions:    . DISCONTD: sodium chloride 75 mL/hr (03/27/12 1100)   PRN Meds:.sodium chloride, acetaminophen, albuterol, fentaNYL, levalbuterol, morphine injection, nitroGLYCERIN, ondansetron (ZOFRAN) IV, DISCONTD: nitroGLYCERIN  Labs:  CMP     Component Value Date/Time   NA 125* 03/29/2012 0522   K 4.3 03/29/2012 0522   CL 86* 03/29/2012 0522   CO2 29 03/29/2012 0522   GLUCOSE 104* 03/29/2012 0522   BUN 12 03/29/2012 0522   CREATININE 1.09 03/29/2012 0522   CALCIUM 8.8 03/29/2012 0522   PROT 5.8* 03/19/2012 0500   ALBUMIN 3.4* 03/29/2012 0522   AST 94* 03/19/2012 0500   ALT 45 03/19/2012 0500   ALKPHOS 58 03/19/2012 0500   BILITOT 0.2* 03/19/2012 0500   GFRNONAA 67* 03/29/2012 0522   GFRAA 77* 03/29/2012 0522     Intake/Output Summary (Last 24 hours) at 03/29/12 1438 Last data filed at 03/29/12 1137  Gross per 24 hour  Intake    584 ml  Output   2800 ml  Net  -2216 ml    Weight Status: 62.1 kg, down a little  Re-estimated needs:  1700-1900 kcals, 90-105 gm protein  Nutrition Dx:  Inadequate Oral Intake, resolved  Goal:  Oral intake with meals to meet >/= 90% of estimated nutrition needs, met  Monitor:  PO intake, weight, labs, I/O's   Clarene Duke RD, LDN Pager [phone] After Hours pager [phone]

## 2012-03-29 NOTE — Progress Notes (Signed)
Echocardiogram 2D Echocardiogram has been performed.  Teri Diltz 03/29/2012, 12:53 PM

## 2012-03-29 NOTE — Progress Notes (Signed)
Physical Therapy Treatment Patient Details Name: [name] [name] MRN: [id number] DOB: Jul 27, 1940 Today's Date: 03/29/2012 Time: 0902-0925 PT Time Calculation (min): 23 min  PT Assessment / Plan / Recommendation Comments on Treatment Session  Pt moves well.  Limited by respiratory complications but tolerated session fairly well.  Did not ambulate per pt's request.      Follow Up Recommendations  No PT follow up;Supervision - Intermittent     Does the patient have the potential to tolerate intense rehabilitation     Barriers to Discharge        Equipment Recommendations  None recommended by PT    Recommendations for Other Services    Frequency Min 3X/week   Plan Discharge plan remains appropriate;Frequency remains appropriate    Precautions / Restrictions Restrictions Weight Bearing Restrictions: No    Pertinent Vitals/Pain Pt on non-rebreather upon arrival but RN changed pt to 40% venturi mask.  02 sats dropped to 86% at times with activity but quickly returned back to >90% with rest break.      Mobility  Bed Mobility Bed Mobility: Not assessed Details for Bed Mobility Assistance: pt sitting on EOB upon arrival  Transfers Transfers: Sit to Stand;Stand to Sit;Stand Pivot Transfers Sit to Stand: 6: Modified independent (Device/Increase time);With upper extremity assist;With armrests;From bed;From chair/3-in-1 Stand to Sit: 6: Modified independent (Device/Increase time);With upper extremity assist;With armrests;To bed Stand Pivot Transfers: 6: Modified independent (Device/Increase time) Details for Transfer Assistance: Pt performed 12x's for strengthening & activity tolerance.   Ambulation/Gait Ambulation/Gait Assistance Details: gait not tested today due to pt cont's with respiratory problems.  RN does report it would be ok to ambulate if pt feels up to it, but pt requests to hold off on ambulating at this time.         PT Goals Acute Rehab PT Goals Time For Goal  Achievement: 03/27/12 Potential to Achieve Goals: Good Pt will go Sit to Stand: with modified independence PT Goal: Sit to Stand - Progress: Met Pt will go Stand to Sit: with modified independence PT Goal: Stand to Sit - Progress: Met Pt will Transfer Bed to Chair/Chair to Bed: with modified independence PT Transfer Goal: Bed to Chair/Chair to Bed - Progress: Met Pt will Ambulate: >150 feet;with modified independence;with least restrictive assistive device;Other (comment) Pt will Perform Home Exercise Program: Independently  Visit Information  Last PT Received On: 03/29/12 Assistance Needed: +1    Subjective Data      Cognition  Overall Cognitive Status: Appears within functional limits for tasks assessed/performed Arousal/Alertness: Awake/alert Orientation Level: Appears intact for tasks assessed Behavior During Session: [facility] for tasks performed    Balance     End of Session PT - End of Session Equipment Utilized During Treatment: Gait belt;Oxygen Activity Tolerance: Patient tolerated treatment well;Patient limited by fatigue Patient left: in chair;with call bell/phone within reach Nurse Communication: Mobility status    Verdell Face, [place] [phone] 03/29/2012

## 2012-03-30 ENCOUNTER — Inpatient Hospital Stay (HOSPITAL_COMMUNITY): Payer: Medicare Other

## 2012-03-30 DIAGNOSIS — I5189 Other ill-defined heart diseases: Secondary | ICD-10-CM | POA: Diagnosis present

## 2012-03-30 DIAGNOSIS — J90 Pleural effusion, not elsewhere classified: Secondary | ICD-10-CM | POA: Diagnosis not present

## 2012-03-30 DIAGNOSIS — I313 Pericardial effusion (noninflammatory): Secondary | ICD-10-CM | POA: Diagnosis not present

## 2012-03-30 LAB — BASIC METABOLIC PANEL
BUN: 16 mg/dL (ref 6–23)
CO2: 35 mEq/L — ABNORMAL HIGH (ref 19–32)
Calcium: 8.6 mg/dL (ref 8.4–10.5)
Creatinine, Ser: 1.15 mg/dL (ref 0.50–1.35)
GFR calc non Af Amer: 63 mL/min — ABNORMAL LOW (ref >90)
Glucose, Bld: 92 mg/dL (ref 70–99)

## 2012-03-30 LAB — BODY FLUID CELL COUNT WITH DIFFERENTIAL
Eos, Fluid: 1 %
Total Nucleated Cell Count, Fluid: 920 cu mm (ref 0–1000)

## 2012-03-30 LAB — PROTEIN, BODY FLUID

## 2012-03-30 LAB — LACTATE DEHYDROGENASE, PLEURAL OR PERITONEAL FLUID: LD, Fluid: 163 U/L — ABNORMAL HIGH (ref 3–23)

## 2012-03-30 MED ORDER — LIDOCAINE 5 % EX PTCH
1.0000 | MEDICATED_PATCH | CUTANEOUS | Status: DC
  Administered 2012-03-30 – 2012-04-06 (×8): 1 via TRANSDERMAL
  Filled 2012-03-30 (×8): qty 1

## 2012-03-30 MED ORDER — POTASSIUM CHLORIDE CRYS ER 20 MEQ PO TBCR
20.0000 meq | EXTENDED_RELEASE_TABLET | Freq: Every day | ORAL | Status: DC
  Administered 2012-03-31 – 2012-04-03 (×4): 20 meq via ORAL
  Filled 2012-03-30 (×6): qty 1

## 2012-03-30 MED ORDER — TRAMADOL HCL 50 MG PO TABS
50.0000 mg | ORAL_TABLET | Freq: Four times a day (QID) | ORAL | Status: DC | PRN
  Administered 2012-04-01 – 2012-04-05 (×5): 50 mg via ORAL
  Filled 2012-03-30 (×5): qty 1

## 2012-03-30 MED ORDER — POTASSIUM CHLORIDE CRYS ER 20 MEQ PO TBCR
40.0000 meq | EXTENDED_RELEASE_TABLET | Freq: Once | ORAL | Status: AC
  Administered 2012-03-30: 40 meq via ORAL
  Filled 2012-03-30: qty 2

## 2012-03-30 NOTE — Procedures (Signed)
Thoracentesis Procedure Note  Pre-operative Diagnosis: Bilateral L>R pleural effusion.  Post-operative Diagnosis: same  Indications: Pleural effusion.  Procedure Details  Consent: Informed consent was obtained. Risks of the procedure were discussed including: infection, bleeding, pain, pneumothorax.  Under sterile conditions the patient was positioned. Betadine solution and sterile drapes were utilized.  1% buffered lidocaine was used to anesthetize the 6 rib space. Fluid was obtained without any difficulties and minimal blood loss.  A dressing was applied to the wound and wound care instructions were provided.   Findings 1000 ml of clear pleural fluid was obtained. A sample was sent to Pathology for cytogenetics, flow, and cell counts, as well as for infection analysis.  Complications:  None; patient tolerated the procedure well.          Condition: stable  Plan A follow up chest x-ray was ordered. Bed Rest for 2 hours. Tylenol 650 mg. for pain.  Attending Attestation: I was present and scrubbed for the entire procedure.  Real time U/S used in placement, picture in chart.  Alyson Reedy, M.D. [facility] Pulmonary/Critical Care Medicine. Pager: [phone]. After hours pager: [phone].

## 2012-03-30 NOTE — Progress Notes (Signed)
PA Student Daily Progress Note [facility] Subjective: Pt states he had a hard time sleeping last night. Per nursing he was confused over night after pain meds. Still having increased work of breathing. Denies chest pain, headache, dizziness, and nausea. No new complaints.   Objective:  Filed Vitals:   03/30/12 0500 03/30/12 0600 03/30/12 0800 03/30/12 0829  BP:    127/59  Pulse: 61 62 63 64  Temp:    99 F (37.2 C)  TempSrc:    Oral  Resp:   20   Height:      Weight: 61.1 kg (134 lb 11.2 oz)     SpO2: 88% 93% 94% 95%   Physical Exam: General appearance: Sitting up in bed eating, NAD  Lungs: diminished breath sounds bilaterally and wheezes bilaterally  Heart: regular rate and rhythm, S1, S2 normal, no murmur, click, rub or gallop  Abdomen: soft, non-tender; bowel sounds normal; no masses, no organomegaly  Extremities: extremities normal, atraumatic, no cyanosis or edema  Neuro: grossly intact  Assessment/Plan: [name] is a 71 yo male with a PMH of HNT, COPD (current smoker-50 pack year history), CVA (2004), PVD, Hyperlipidemia, and femoral bi-pass in 2012.   1. Hyponatremia- review of previous records indicates a Na baseline of 128-134. Pts Na has declined over admission from 131 to 121 yesterday. 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 CXR shows edema. This is likely volume related. Yesterday was 125, today is 123.   - Continue Lasix PO   - Fluid restrict PO to 1L per day   - Continue to monitor   2. STEMI with unsuccessful RCA PCI- primary team was unable to place stent despite multiple attempts.   - Medical management per primary team  3. COPD- pt current smoker with 50 pack year history who presented in acute resp failure and had to be intubated.   - Smoking cessation counseling   - pt will likely need home O2 given current needs during admission   - continue per primary team  Labs: Basic Metabolic Panel:  Lab 03/30/12 3244 03/29/12 0522  03/28/12 1744 03/28/12 1253  NA 123* 125* 126* --  K 3.5 4.3 4.3 --  CL 81* 86* -- 82*  CO2 35* 29 -- 29  GLUCOSE 92 104* -- 109*  BUN 16 12 -- 11  CREATININE 1.15 1.09 -- 0.99  CALCIUM 8.6 8.8 -- 8.8  ALB -- -- -- --  PHOS -- 4.0 -- --   Liver Function Tests:  Lab 03/29/12 0522  AST --  ALT --  ALKPHOS --  BILITOT --  PROT --  ALBUMIN 3.4*   No results found for this basename: LIPASE:3,AMYLASE:3 in the last 168 hours No results found for this basename: AMMONIA:3 in the last 168 hours INR: @resultsinr3 @ CBC:  Lab 03/28/12 1253 03/28/12 0625 03/24/12 0505  WBC 13.4* 14.2* 10.2  NEUTROABS -- -- --  HGB 11.6* 10.6* 10.5*  HCT 33.4* 31.1* 30.9*  MCV 88.6 89.6 89.3  PLT 394 435* 350   Blood Culture    Component Value Date/Time   SDES SPUTUM 03/27/2012 1117   SDES SPUTUM 03/27/2012 1117   SPECREQUEST NONE 03/27/2012 1117   SPECREQUEST NONE 03/27/2012 1117   CULT NORMAL OROPHARYNGEAL FLORA 03/27/2012 1117   REPTSTATUS 03/27/2012 FINAL 03/27/2012 1117   REPTSTATUS 03/29/2012 FINAL 03/27/2012 1117    Cardiac Enzymes:  Lab 03/28/12 2157 03/28/12 1744 03/28/12 1158 03/26/12 1247 03/26/12 0500 03/25/12 1230  CKTOTAL -- --  140 128 -- 108  CKMB -- -- 9.0* 5.5* -- 4.8*  CKMBINDEX -- -- -- -- -- --  TROPONINI 0.62* 0.46* 0.69* 1.24* 1.58* --   CBG: No results found for this basename: GLUCAP:5 in the last 168 hours Iron Studies: No results found for this basename: IRON,TIBC,TRANSFERRIN,FERRITIN in the last 72 hours  Micro Results: Recent Results (from the past 240 hour(s))  CULTURE, EXPECTORATED SPUTUM-ASSESSMENT     Status: Normal   Collection Time   03/27/12 11:17 AM      Component Value Range Status Comment   Specimen Description SPUTUM   Final    Special Requests NONE   Final    Sputum evaluation     Final    Value: THIS SPECIMEN IS ACCEPTABLE. RESPIRATORY CULTURE REPORT TO FOLLOW.   Report Status 03/27/2012 FINAL   Final   CULTURE, RESPIRATORY     Status: Normal    Collection Time   03/27/12 11:17 AM      Component Value Range Status Comment   Specimen Description SPUTUM   Final    Special Requests NONE   Final    Gram Stain     Final    Value: NO WBC SEEN     RARE SQUAMOUS EPITHELIAL CELLS PRESENT     RARE GRAM POSITIVE RODS     RARE GRAM POSITIVE COCCI     IN PAIRS RARE GRAM NEGATIVE RODS   Culture NORMAL OROPHARYNGEAL FLORA   Final    Report Status 03/29/2012 FINAL   Final    Studies/Results: Ct Angio Chest Pe W/cm &/or Wo Cm  03/28/2012  *RADIOLOGY REPORT*  Clinical Data: SOB, hypoxia, chest/back pain  CT ANGIOGRAPHY CHEST  Technique:  Multidetector CT imaging of the chest using the standard protocol during bolus administration of intravenous contrast. Multiplanar reconstructed images including MIPs were obtained and reviewed to evaluate the vascular anatomy.  Contrast: 80mL OMNIPAQUE IOHEXOL 350 MG/ML SOLN  Comparison: Chest radiograph dated 03/28/2012.  Findings: No evidence of pulmonary embolism.  Mild interstitial edema.  Moderate left and small to moderate right pleural effusions.  Associated compressive atelectasis in the bilateral lower lobes and left upper lobe. No pneumothorax.  The visualized thyroid is mildly heterogeneous.  Cardiomegaly.  Small pericardial effusion.  Coronary atherosclerosis.  Atherosclerotic calcifications of the aortic arch.  Although not tailored for optimal evaluation of the aorta, there is no evidence of aortic dissection.  Mediastinal and left supraclavicular lymphadenopathy, mildly increased, including: --1.4 cm short-axis left supraclavicular node (series 4/image 8) --1.1 cm short axis high left paratracheal node (series 4/image 15) --1.4 cm short-axis right paratracheal node (series 4/image 46) --1.2 cm short-axis subcarinal node (series 4/image 64)  Visualized upper abdomen is unremarkable.  IMPRESSION: No evidence of pulmonary embolism.  No evidence of aortic dissection.  Mild interstitial edema.  Moderate left and  small to moderate right pleural effusions.  Cardiomegaly.  Small pericardial effusion.  Mildly enlarged mediastinal/left supraclavicular lymph nodes, as described above.   Original Report Authenticated By: Charline Bills, M.D.    Dg Chest Port 1 View  03/28/2012  *RADIOLOGY REPORT*  Clinical Data: Respiratory distress, cough, left-sided chest pain  PORTABLE CHEST - 1 VIEW  Comparison: 03/25/2012  Findings: The cardiac shadow remains mildly enlarged.  A left-sided pleural effusion is again seen. Some slight vascular congestion is noted.  Postoperative changes are seen in the region of the left upper chest.  IMPRESSION: Persistent left pleural effusion.  Mild vascular congestion is now seen.  Original Report Authenticated By: Phillips Odor, M.D.    Medications: Scheduled Meds:   . antiseptic oral rinse  15 mL Mouth Rinse BID  . aspirin  81 mg Oral Daily  . atorvastatin  20 mg Oral q1800  . clopidogrel  75 mg Oral Q breakfast  . demeclocycline  300 mg Oral BID  . enoxaparin (LOVENOX) injection  40 mg Subcutaneous Q24H  . furosemide  40 mg Oral BID  . gabapentin  600 mg Oral BID  . isosorbide mononitrate  60 mg Oral Daily  . lidocaine  1 patch Transdermal Q24H  . losartan  50 mg Oral BID  . metoprolol tartrate  12.5 mg Oral TID  . nicotine  21 mg Transdermal Daily  . pantoprazole  40 mg Oral Q1200  . potassium chloride  20 mEq Oral Daily  . potassium chloride  40 mEq Oral Once  . tiotropium  18 mcg Inhalation Daily  . DISCONTD: amiodarone  200 mg Oral Daily  . DISCONTD: furosemide  40 mg Intravenous BID  . DISCONTD: furosemide  20 mg Oral BID   Continuous Infusions:  PRN Meds:.sodium chloride, acetaminophen, albuterol, fentaNYL, levalbuterol, morphine injection, nitroGLYCERIN, ondansetron (ZOFRAN) IV, traMADol   This is a Psychologist, occupational Note.  The care of the patient was discussed with Dr. Briant Cedar and the assessment and plan formulated with their assistance.  Please see their  attached note for official documentation of the daily encounter.  Aniceto Boss, MA,  PA-S2 03/30/2012, 9:35 AM Pt seen and examined and note reviewed.  Surprised that Sna is less despite neg fluid status yest. Cont Demeclocycline and lasix.  Daily Sna.  To get thoracentesis today.

## 2012-03-30 NOTE — Progress Notes (Signed)
PT Cancellation Note  Patient Details Name: [name] [name] MRN: [id number] DOB: August 05, 1940   Cancelled Treatment:     Per RN's request.  Pt to have thoracentesis today.      Verdell Face, [place] [phone] 03/30/2012

## 2012-03-30 NOTE — Progress Notes (Signed)
Nofied Dr. Herbie Baltimore of family's concern that patient has been confused over the past couple of days. Daughter stated that "he was not confused before, even after his heart attack and being in ICU."  Will continue to monitor pt closely.

## 2012-03-30 NOTE — Progress Notes (Addendum)
Subjective:  Less SOB. Up eating breakfast this am. Confused during the night after MSO4 for back pain.  Objective:  Vital Signs in the last 24 hours: Temp:  [97.2 F (36.2 C)-99 F (37.2 C)] 99 F (37.2 C) (10/09 0829) Pulse Rate:  [56-80] 64  (10/09 0829) Resp:  [18-22] 18  (10/09 0400) BP: (101-127)/(52-77) 127/59 mmHg (10/09 0829) SpO2:  [88 %-98 %] 95 % (10/09 0829) FiO2 (%):  [0.4 %-40 %] 40 % (10/08 2000) Weight:  [61.1 kg (134 lb 11.2 oz)] 61.1 kg (134 lb 11.2 oz) (10/09 0500)  Intake/Output from previous day:  Intake/Output Summary (Last 24 hours) at 03/30/12 0846 Last data filed at 03/30/12 0700  Gross per 24 hour  Intake    549 ml  Output   1375 ml  Net   -826 ml    Physical Exam: General appearance: alert, cooperative, no distress and chronically ill Lungs: rhonchi lt base; non-labored Heart: regular rate and rhythm Abd: soft/NT/ND/NABS Ext: no C/C/E   Rate: 85  Rhythm: normal sinus rhythm  Lab Results:  Basename 03/28/12 1253 03/28/12 0625  WBC 13.4* 14.2*  HGB 11.6* 10.6*  PLT 394 435*    Basename 03/30/12 0420 03/29/12 0522  NA 123* 125*  K 3.5 4.3  CL 81* 86*  CO2 35* 29  GLUCOSE 92 104*  BUN 16 12  CREATININE 1.15 1.09    Basename 03/28/12 2157 03/28/12 1744  TROPONINI 0.62* 0.46*   Hepatic Function Panel  Basename 03/29/12 0522  PROT --  ALBUMIN 3.4*  AST --  ALT --  ALKPHOS --  BILITOT --  BILIDIR --  IBILI --    Echo 10/8: 35% to 40%. Akinesis of the inferolateral and inferior myocardium. Features are consistent with a pseudonormal left ventricular filling pattern, with concomitant abnormal relaxation and increased filling pressure (grade 2 diastolic dysfunction). Doppler parameters are consistent with elevated mean left atrial filling pressure.  Mild MR.  Assessment/Plan:   Principal Problem:  *STEMI, unsuccessful RCA PCI 03/17/12 Active Problems:  Acute respiratory failure, on admit and recurrent 03/28/12  Acute  pulmonary edema  Hyponatremia, acute on chronic. ? secondary to volume vs SIADH vs drug related  COPD with respiratory failure, acute  PAF, post MI,  on Amiodarone  NSVT, post MI  Ischemic cardiomyopathy, EF 30-35% by echo 03/29/12  Pericardial effusion, moderate on echo 03/29/12  Tobacco abuse  PVD, Lt Ax-fem and fem-fem BPG 2/12, Dr Manson Passey follows  HTN (hypertension)  Dyslipidemia  History of CVA (cerebrovascular accident)  Diastolic dysfunction, grade 2 Chest CT shows at least 2 Thoracic spine compression fractures.  Plan- His Na+ is still low despite diuresis. He seems stable this am. Keep in stepdown.  Corine Shelter PA-C 03/30/2012, 8:46 AM  I have seen & examined the patient along with Mr. Diona Fanti, [place].  I agree with his findings& exam.  Still diuresed on PO lasix, but Na dropped -- very confusing scenario.  Continue Demeclocycline & follow Nephrology recommendations. ? [place].  No further CP & no evidence of Mitral Valve rupture to explain his decompensation  & continued increase in O2 requirement.  SaO2 drops to 70's off of mask -- despite aggressive diuresis.  May need to continue with intermittent IV dosing.    He does have bilateral pleural effusions with bilateral atelectasis superimposed on severe COPD.  ? Benefit of BiPAP at least overnight to help with lung re-expansion.  ?? Therapeutic thoracentesis -- PCCM following.  Incentive Spirometry.  Back pain is  most likely 2/2 compression fractures -- unfortunately, he gets confused @ night with narcotics.  Will adjust regimen.  He apparently had a "fall" being pulled down by his family dog in late September --has been having this back pain since (also injured his shoulder & "broke a few ribs". Will add lidoderm patch.  Remains hemodynamically stable on low dose BB, Imdur & ARB.  No further angina.   Will d/c Amiodarone -- ? If his decompensation may be due to toxicity. -- ? If he would benefit from steroid Rx, defer to PCCM for  opinion re amiodarone toxicity as a possibility.  He has not had significant NSVT recently.  Replete K+ Continue COPD meds  With continued O2 requirement via FM, will keep in SDU. Prn anxiolytic.  Marykay Lex, M.D., M.S. [facility] [address] [place], [place]  [address]  [phone] Pager # [phone] 03/30/2012 9:09 AM

## 2012-03-31 DIAGNOSIS — J90 Pleural effusion, not elsewhere classified: Secondary | ICD-10-CM

## 2012-03-31 LAB — CHOLESTEROL, BODY FLUID

## 2012-03-31 LAB — FUNGAL STAIN: Fungal Smear: NONE SEEN

## 2012-03-31 LAB — PATHOLOGIST SMEAR REVIEW: Path Review: REACTIVE

## 2012-03-31 LAB — BASIC METABOLIC PANEL
CO2: 31 mEq/L (ref 19–32)
Chloride: 85 mEq/L — ABNORMAL LOW (ref 96–112)
Sodium: 126 mEq/L — ABNORMAL LOW (ref 135–145)

## 2012-03-31 MED ORDER — POTASSIUM CHLORIDE CRYS ER 20 MEQ PO TBCR
40.0000 meq | EXTENDED_RELEASE_TABLET | Freq: Once | ORAL | Status: AC
  Administered 2012-03-31: 40 meq via ORAL
  Filled 2012-03-31: qty 2

## 2012-03-31 NOTE — Progress Notes (Signed)
SUBJ:  Feels much better after thoracentesis. No distress. No new complaints   OBJ:  Filed Vitals:   03/31/12 0700 03/31/12 0734 03/31/12 0855 03/31/12 1156  BP:  112/65  95/55  Pulse: 52 71  51  Temp:  98 F (36.7 C)  98 F (36.7 C)  TempSrc:  Oral  Oral  Resp:      Height:      Weight:      SpO2: 94% 96% 98% 96%   NAD JVP not well visualized No wheezes. Dimished BS in R base RRR NABS No edema Chronic stasis changes   BMET    Component Value Date/Time   NA 126* 03/31/2012 0440   K 3.3* 03/31/2012 0440   CL 85* 03/31/2012 0440   CO2 31 03/31/2012 0440   GLUCOSE 87 03/31/2012 0440   BUN 19 03/31/2012 0440   CREATININE 1.14 03/31/2012 0440   CALCIUM 8.5 03/31/2012 0440   GFRNONAA 63* 03/31/2012 0440   GFRAA 73* 03/31/2012 0440    CBC    Component Value Date/Time   WBC 13.4* 03/28/2012 1253   RBC 3.77* 03/28/2012 1253   HGB 11.6* 03/28/2012 1253   HCT 33.4* 03/28/2012 1253   PLT 394 03/28/2012 1253   MCV 88.6 03/28/2012 1253   MCH 30.8 03/28/2012 1253   MCHC 34.7 03/28/2012 1253   RDW 13.3 03/28/2012 1253   LYMPHSABS 0.9 03/16/2012 0937   MONOABS 2.2* 03/16/2012 0937   EOSABS 0.0 03/16/2012 0937   BASOSABS 0.0 03/16/2012 0937   Fluid LDH: 163    Serum: 407 Fluid protein: 2.3 Fluid cholesterol: 21 No new CXR   IMPRESSION: Bilateral effusions, transudates - elevated BNP, known cardiomyopathy and fluid characteristics suggest that this is a transudative process due to CHF   PLAN/REC: Continue to optimize Rx of CHF No further eval or Rx of pleural effsuions warranted @ this time  PCCM will sign off. Please call if we can be of further assistance    Billy Fischer, MD ; [facility] [phone].  After 5:30 PM or weekends, call [phone]

## 2012-03-31 NOTE — Progress Notes (Signed)
CARDIAC REHAB PHASE I   PRE:  Rate/Rhythm: 55SB  BP:  Supine:   Sitting: 94/45  Standing:    SaO2: 95%4L  MODE:  Ambulation: 700 ft   POST:  Rate/Rhythem: 78SR  BP:  Supine:   Sitting: 102/49  Standing:    SaO2: 91%4L hall, 93% room 1355-1415 Pt ready to walk. Tolerated well. Said he could have kept going. Walked 700 ft on 4L with handheld asst. To recliner after walk. Call bell in reach.  Duanne Limerick

## 2012-03-31 NOTE — Progress Notes (Signed)
[facility]  DAILY PROGRESS NOTE   Subjective:  S/p thoracentesis yesterday. CXR shows interval improvement of effusion and no PTX.  He is sleeping better, but still short of breath. Sodium continues to run low despite demeclocycline. BNP continues to rise despite diuretics.  Objective:  Temp:  [97.9 F (36.6 C)-99 F (37.2 C)] 98 F (36.7 C) (10/10 0734) Pulse Rate:  [43-71] 71  (10/10 0734) Resp:  [16-22] 18  (10/09 1900) BP: (70-127)/(32-83) 112/65 mmHg (10/10 0734) SpO2:  [86 %-98 %] 96 % (10/10 0734) FiO2 (%):  [40 %] 40 % (10/09 1258) Weight:  [59 kg (130 lb 1.1 oz)] 59 kg (130 lb 1.1 oz) (10/10 0500) Weight change: -2.1 kg (-4 lb 10.1 oz)  Intake/Output from previous day: 10/09 0701 - 10/10 0700 In: 970 [P.O.:970] Out: 1150 [Urine:1150]  Intake/Output from this shift:    Medications: Current Facility-Administered Medications  Medication Dose Route Frequency Provider Last Rate Last Dose  . 0.9 %  sodium chloride infusion  250 mL Intravenous PRN Jeanella Craze, NP 10 mL/hr at 03/19/12 2305 250 mL at 03/19/12 2305  . acetaminophen (TYLENOL) tablet 650 mg  650 mg Oral Q4H PRN Corky Crafts, MD   650 mg at 03/30/12 2115  . albuterol (PROVENTIL) (5 MG/ML) 0.5% nebulizer solution 2.5 mg  2.5 mg Nebulization Q4H PRN Leslye Peer, MD   2.5 mg at 03/28/12 1003  . antiseptic oral rinse (BIOTENE) solution 15 mL  15 mL Mouth Rinse BID Runell Gess, MD   15 mL at 03/30/12 2116  . aspirin chewable tablet 81 mg  81 mg Oral Daily Corky Crafts, MD   81 mg at 03/30/12 1018  . atorvastatin (LIPITOR) tablet 20 mg  20 mg Oral q1800 Runell Gess, MD   20 mg at 03/30/12 1759  . clopidogrel (PLAVIX) tablet 75 mg  75 mg Oral Q breakfast Corky Crafts, MD   75 mg at 03/30/12 0800  . demeclocycline (DECLOMYCIN) tablet 300 mg  300 mg Oral BID Chrystie Nose, MD   300 mg at 03/30/12 2115  . enoxaparin (LOVENOX) injection 40 mg  40 mg  Subcutaneous Q24H Marty Heck, PHARMD   40 mg at 03/30/12 1018  . fentaNYL (SUBLIMAZE) injection 50 mcg  50 mcg Intravenous Q3H PRN Coralyn Helling, MD   50 mcg at 03/27/12 2334  . furosemide (LASIX) tablet 40 mg  40 mg Oral BID Dyke Maes, MD   40 mg at 03/30/12 1758  . gabapentin (NEURONTIN) tablet 600 mg  600 mg Oral BID Corky Crafts, MD   600 mg at 03/30/12 2114  . isosorbide mononitrate (IMDUR) 24 hr tablet 60 mg  60 mg Oral Daily Chrystie Nose, MD   60 mg at 03/30/12 1018  . levalbuterol (XOPENEX) nebulizer solution 0.63 mg  0.63 mg Nebulization Q6H PRN Nada Boozer, NP   0.63 mg at 03/29/12 0630  . lidocaine (LIDODERM) 5 % 1 patch  1 patch Transdermal Q24H Marykay Lex, MD   1 patch at 03/30/12 1211  . losartan (COZAAR) tablet 50 mg  50 mg Oral BID Nada Boozer, NP   50 mg at 03/30/12 2115  . metoprolol tartrate (LOPRESSOR) tablet 12.5 mg  12.5 mg Oral TID Nada Boozer, NP   12.5 mg at 03/30/12 2114  . morphine 2 MG/ML injection 2 mg  2 mg Intravenous Q2H PRN Nada Boozer, NP   2 mg at  03/29/12 2110  . nicotine (NICODERM CQ - dosed in mg/24 hours) patch 21 mg  21 mg Transdermal Daily Runell Gess, MD   21 mg at 03/30/12 1018  . nitroGLYCERIN (NITROSTAT) SL tablet 0.4 mg  0.4 mg Sublingual Q5 Min x 3 PRN Jeanella Craze, NP      . ondansetron (ZOFRAN) injection 4 mg  4 mg Intravenous Q6H PRN Corky Crafts, MD      . pantoprazole (PROTONIX) EC tablet 40 mg  40 mg Oral Q1200 Coralyn Helling, MD   40 mg at 03/30/12 1210  . potassium chloride SA (K-DUR,KLOR-CON) CR tablet 20 mEq  20 mEq Oral Daily Eda Paschal [place], [place]      . potassium chloride SA (K-DUR,KLOR-CON) CR tablet 40 mEq  40 mEq Oral Once [employer], [place]   40 mEq at 03/30/12 1018  . tiotropium (SPIRIVA) inhalation capsule 18 mcg  18 mcg Inhalation Daily Nada Boozer, NP   18 mcg at 03/30/12 1257  . traMADol (ULTRAM) tablet 50 mg  50 mg Oral Q6H PRN Abelino Derrick, PA      . DISCONTD: amiodarone (PACERONE) tablet  200 mg  200 mg Oral Daily Nada Boozer, NP   200 mg at 03/29/12 4098    Physical Exam: General appearance: alert and no distress Neck: no adenopathy, no carotid bruit, no JVD, supple, symmetrical, trachea midline and thyroid not enlarged, symmetric, no tenderness/mass/nodules Lungs: diminished breath sounds RLL Heart: regular rate and rhythm, S1, S2 normal, no murmur, click, rub or gallop Abdomen: soft, non-tender; bowel sounds normal; no masses,  no organomegaly Extremities: extremities normal, atraumatic, no cyanosis or edema Pulses: 2+ and symmetric  Lab Results: Results for orders placed during the hospital encounter of 03/16/12 (from the past 48 hour(s))  BASIC METABOLIC PANEL     Status: Abnormal   Collection Time   03/30/12  4:20 AM      Component Value Range Comment   Sodium 123 (*) 135 - 145 mEq/L    Potassium 3.5  3.5 - 5.1 mEq/L    Chloride 81 (*) 96 - 112 mEq/L    CO2 35 (*) 19 - 32 mEq/L    Glucose, Bld 92  70 - 99 mg/dL    BUN 16  6 - 23 mg/dL    Creatinine, Ser 1.19  0.50 - 1.35 mg/dL    Calcium 8.6  8.4 - 14.7 mg/dL    GFR calc non Af Amer 63 (*) >90 mL/min    GFR calc Af Amer 73 (*) >90 mL/min   PRO B NATRIURETIC PEPTIDE     Status: Abnormal   Collection Time   03/30/12  4:20 AM      Component Value Range Comment   Pro B Natriuretic peptide (BNP) 9610.0 (*) 0 - 125 pg/mL   LACTATE DEHYDROGENASE, BODY FLUID     Status: Abnormal   Collection Time   03/30/12  1:30 PM      Component Value Range Comment   LD, Fluid 163 (*) 3 - 23 U/L    Fluid Type-FLDH FLUID     PROTEIN, BODY FLUID     Status: Normal   Collection Time   03/30/12  1:30 PM      Component Value Range Comment   Total protein, fluid 2.3   NO NORMAL RANGE ESTABLISHED FOR THIS TEST   Fluid Type-FTP FLUID     BODY FLUID CELL COUNT WITH DIFFERENTIAL     Status: Abnormal  Collection Time   03/30/12  1:30 PM      Component Value Range Comment   Fluid Type-FCT FLUID      Color, Fluid YELLOW  YELLOW     Appearance, Fluid HAZY (*) CLEAR    WBC, Fluid 920  0 - 1000 cu mm    Neutrophil Count, Fluid 79 (*) 0 - 25 %    Lymphs, Fluid 20      Monocyte-Macrophage-Serous Fluid 0 (*) 50 - 90 %    Eos, Fluid 1      Other Cells, Fluid RARE MESOTHELIAL CELL AND PLASMACYTOID LYMPH.     BODY FLUID CULTURE     Status: Normal (Preliminary result)   Collection Time   03/30/12  1:30 PM      Component Value Range Comment   Specimen Description FLUID LEFT PLEURAL      Special Requests Normal      Gram Stain        Value: NO WBC SEEN     NO ORGANISMS SEEN   Culture PENDING      Report Status PENDING     PH, BODY FLUID     Status: Normal   Collection Time   03/30/12  1:30 PM      Component Value Range Comment   pH, Fluid Type     CORRECTED ON 10/10 AT 0131: PREVIOUSLY REPORTED AS FLUID LEFT PLEURAL CORRECTED ON 10/09 AT 1410: PREVIOUSLY REPORTED AS THORACENTESIS   Value: CORRECTED ON 10/09 AT 1410: PREVIOUSLY REPORTED AS THORACENTESIS   pH, Fluid 8.00     CHOLESTEROL, BODY FLUID     Status: Normal   Collection Time   03/30/12  1:30 PM      Component Value Range Comment   Cholesterol, Fluid 21      Chol, Fluid Type        Value: CORRECTED ON 10/09 AT 1411: PREVIOUSLY REPORTED AS THORACENTESIS  CHOLESTEROL, TOTAL     Status: Normal   Collection Time   03/30/12  2:16 PM      Component Value Range Comment   Cholesterol 103  0 - 200 mg/dL   LACTATE DEHYDROGENASE     Status: Abnormal   Collection Time   03/30/12  2:17 PM      Component Value Range Comment   LDH 407 (*) 94 - 250 U/L   PROTEIN, TOTAL     Status: Normal   Collection Time   03/30/12  2:17 PM      Component Value Range Comment   Total Protein 6.9  6.0 - 8.3 g/dL   BASIC METABOLIC PANEL     Status: Abnormal   Collection Time   03/31/12  4:40 AM      Component Value Range Comment   Sodium 126 (*) 135 - 145 mEq/L    Potassium 3.3 (*) 3.5 - 5.1 mEq/L    Chloride 85 (*) 96 - 112 mEq/L    CO2 31  19 - 32 mEq/L    Glucose, Bld 87  70 - 99  mg/dL    BUN 19  6 - 23 mg/dL    Creatinine, Ser 1.61  0.50 - 1.35 mg/dL    Calcium 8.5  8.4 - 09.6 mg/dL    GFR calc non Af Amer 63 (*) >90 mL/min    GFR calc Af Amer 73 (*) >90 mL/min     Imaging: Dg Chest Port 1 View  03/30/2012  *RADIOLOGY REPORT*  Clinical Data: Post left thoracentesis.  PORTABLE CHEST - 1 VIEW  Comparison: 03/30/2012 and CT chest 03/28/2012.  Findings: Trachea is midline.  Heart is enlarged.  Thoracic aorta is calcified.  Biapical pleural parenchymal scarring.  Bibasilar air space disease with small bilateral pleural effusions.  Left pleural effusion has decreased slightly in the interval after left thoracentesis.  No definite pneumothorax.  Old left rib fractures.  Surgical clips project over the upper left chest.  IMPRESSION:  1.  Interval decrease in size of left pleural effusion after thoracentesis.  No pneumothorax. 2. Congestive heart failure.   Original Report Authenticated By: Reyes Ivan, M.D.    Dg Chest Port 1 View  03/30/2012  *RADIOLOGY REPORT*  Clinical Data: Evaluate pleural effusion.  PORTABLE CHEST - 1 VIEW  Comparison: Chest radiograph 03/28/2012 and CT angio chest 03/28/2012  Findings: Stable enlargement cardiopericardial silhouette.  Diffuse pulmonary congestion prominence and bibasilar/perihilar airspace disease.  There are bilateral pleural effusions, left greater than right. Effusions appear without significant change compared to the portable chest radiograph of 03/28/2012.  Surgical clips project over the left axilla.  High-riding right humeral head suggest chronic rotator cuff disease.  IMPRESSION: Congestive heart failure pattern with bilateral pleural effusions. No significant change in aeration of the lungs compared to 03/28/2012.   Original Report Authenticated By: Britta Mccreedy, M.D.     Assessment:  1. Principal Problem: 2.  *STEMI, unsuccessful RCA PCI 03/17/12 3. Active Problems: 4.  Tobacco abuse 5.  Acute respiratory failure, on  admit and recurrent 03/28/12 6.  Acute pulmonary edema 7.  Hyponatremia, acute on chronic. ? 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  COPD with respiratory failure, acute 9.  PVD, Lt Ax-fem and fem-fem BPG 2/12, Dr Manson Passey follows 10.  HTN (hypertension) 11.  Dyslipidemia 12.  History of CVA (cerebrovascular accident) 68.  PAF, post MI,  on Amiodarone 14.  NSVT, post MI 15.  Ischemic cardiomyopathy, EF 30-35% by echo 03/29/12 16.  Pericardial effusion, moderate on echo 03/29/12 17.  Diastolic dysfunction, grade 2 18.  Pleural effusion 19.   Plan:  1. Pleural fluid was hazy with 79% neutrophils, parameters suggest exudative effusion per Light's criteria, ?infection. Await cultures.  Gram stain on pleural fluid shows no WBC's or organisms, which is unusual in that there were 79% neutrophils on the fluid analysis.  Unclear why BNP is rising despite diuresis. Serum sodium is up and down. His whole situation is confusing. 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 He does have a small to moderate pericardial effusion, which could have a Dressler's component (given the pleural white cells). His pain seems to be better controlled and there is no clear indication for pericardiocentesis. EF does appear to have improved slightly to 35-40% with inferior akinesis.  Mental status is improved today, could have been delerium or narcotics.  Time Spent Directly with Patient:  15 minutes  Length of Stay:  LOS: 15 days   Chrystie Nose, MD, [facility] Attending Cardiologist [facility]  Tatelyn Vanhecke C 03/31/2012, 8:10 AM

## 2012-03-31 NOTE — Progress Notes (Signed)
PA Student Daily Progress Note [facility] Subjective: Pt had thoracentesis yesterday with great improvement in SOB. States he is feeling great. Denies headache, dizziness, SOB, CP, nausea, and abd pain.   Objective:  Filed Vitals:   03/31/12 0600 03/31/12 0700 03/31/12 0734 03/31/12 0855  BP:   112/65   Pulse: 54 52 71   Temp:   98 F (36.7 C)   TempSrc:   Oral   Resp:      Height:      Weight:      SpO2: 93% 94% 96% 98%   Physical Exam: General appearance: Sitting up in bed eating, NAD  Lungs: diminished breath sounds bilaterally, CTA bilaterally  Heart: regular rate and rhythm, S1, S2 normal, no murmur, click, rub or gallop  Abdomen: soft, non-tender; bowel sounds normal; no masses, no organomegaly  Extremities: extremities normal, atraumatic, no cyanosis or edema  Neuro: grossly intact  Assessment/Plan: [name] [name] is a 71 yo male with a PMH of HNT, COPD (current smoker-50 pack year history), CVA (2004), PVD, Hyperlipidemia, and femoral bi-pass in 2012.   1. Hyponatremia- review of previous records indicates a Na baseline of 128-134. Pts Na has declined over admission from 131 to 121 yesterday. 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 CXR shows edema. This is likely volume related. Today is 126 up from 123 yesterday.   - Continue Lasix PO   - Fluid restrict PO to 1L per day   - Continue to monitor   2. STEMI with unsuccessful RCA PCI- primary team was unable to place stent despite multiple attempts.   - Medical management per primary team   3. COPD- pt current smoker with 50 pack year history who presented in acute resp failure and had to be intubated.   - Smoking cessation counseling   - pt will likely need home O2 given current needs during admission   - continue per primary team  Labs: Basic Metabolic Panel:  Lab 03/31/12 1610 03/30/12 0420 03/29/12 0522  NA 126* 123* 125*  K 3.3* 3.5 4.3  CL 85* 81* 86*  CO2 31 35* 29  GLUCOSE 87 92  104*  BUN 19 16 12   CREATININE 1.14 1.15 1.09  CALCIUM 8.5 8.6 8.8  ALB -- -- --  PHOS -- -- 4.0   Liver Function Tests:  Lab 03/30/12 1417 03/29/12 0522  AST -- --  ALT -- --  ALKPHOS -- --  BILITOT -- --  PROT 6.9 --  ALBUMIN -- 3.4*   No results found for this basename: LIPASE:3,AMYLASE:3 in the last 168 hours No results found for this basename: AMMONIA:3 in the last 168 hours INR: @resultsinr3 @ CBC:  Lab 03/28/12 1253 03/28/12 0625  WBC 13.4* 14.2*  NEUTROABS -- --  HGB 11.6* 10.6*  HCT 33.4* 31.1*  MCV 88.6 89.6  PLT 394 435*   Blood Culture    Component Value Date/Time   SDES FLUID LEFT PLEURAL 03/30/2012 1330   SPECREQUEST Normal 03/30/2012 1330   CULT PENDING 03/30/2012 1330   REPTSTATUS PENDING 03/30/2012 1330    Cardiac Enzymes:  Lab 03/28/12 2157 03/28/12 1744 03/28/12 1158 03/26/12 1247 03/26/12 0500 03/25/12 1230  CKTOTAL -- -- 140 128 -- 108  CKMB -- -- 9.0* 5.5* -- 4.8*  CKMBINDEX -- -- -- -- -- --  TROPONINI 0.62* 0.46* 0.69* 1.24* 1.58* --   CBG: No results found for this basename: GLUCAP:5 in the last 168 hours Iron Studies: No results found  for this basename: IRON,TIBC,TRANSFERRIN,FERRITIN in the last 72 hours  Micro Results: Recent Results (from the past 240 hour(s))  CULTURE, EXPECTORATED SPUTUM-ASSESSMENT     Status: Normal   Collection Time   03/27/12 11:17 AM      Component Value Range Status Comment   Specimen Description SPUTUM   Final    Special Requests NONE   Final    Sputum evaluation     Final    Value: THIS SPECIMEN IS ACCEPTABLE. RESPIRATORY CULTURE REPORT TO FOLLOW.   Report Status 03/27/2012 FINAL   Final   CULTURE, RESPIRATORY     Status: Normal   Collection Time   03/27/12 11:17 AM      Component Value Range Status Comment   Specimen Description SPUTUM   Final    Special Requests NONE   Final    Gram Stain     Final    Value: NO WBC SEEN     RARE SQUAMOUS EPITHELIAL CELLS PRESENT     RARE GRAM POSITIVE RODS      RARE GRAM POSITIVE COCCI     IN PAIRS RARE GRAM NEGATIVE RODS   Culture NORMAL OROPHARYNGEAL FLORA   Final    Report Status 03/29/2012 FINAL   Final   BODY FLUID CULTURE     Status: Normal (Preliminary result)   Collection Time   03/30/12  1:30 PM      Component Value Range Status Comment   Specimen Description FLUID LEFT PLEURAL   Final    Special Requests Normal   Final    Gram Stain     Final    Value: NO WBC SEEN     NO ORGANISMS SEEN   Culture PENDING   Incomplete    Report Status PENDING   Incomplete    Studies/Results: Dg Chest Port 1 View  03/30/2012  *RADIOLOGY REPORT*  Clinical Data: Post left thoracentesis.  PORTABLE CHEST - 1 VIEW  Comparison: 03/30/2012 and CT chest 03/28/2012.  Findings: Trachea is midline.  Heart is enlarged.  Thoracic aorta is calcified.  Biapical pleural parenchymal scarring.  Bibasilar air space disease with small bilateral pleural effusions.  Left pleural effusion has decreased slightly in the interval after left thoracentesis.  No definite pneumothorax.  Old left rib fractures.  Surgical clips project over the upper left chest.  IMPRESSION:  1.  Interval decrease in size of left pleural effusion after thoracentesis.  No pneumothorax. 2. Congestive heart failure.   Original Report Authenticated By: Reyes Ivan, M.D.    Dg Chest Port 1 View  03/30/2012  *RADIOLOGY REPORT*  Clinical Data: Evaluate pleural effusion.  PORTABLE CHEST - 1 VIEW  Comparison: Chest radiograph 03/28/2012 and CT angio chest 03/28/2012  Findings: Stable enlargement cardiopericardial silhouette.  Diffuse pulmonary congestion prominence and bibasilar/perihilar airspace disease.  There are bilateral pleural effusions, left greater than right. Effusions appear without significant change compared to the portable chest radiograph of 03/28/2012.  Surgical clips project over the left axilla.  High-riding right humeral head suggest chronic rotator cuff disease.  IMPRESSION: Congestive heart  failure pattern with bilateral pleural effusions. No significant change in aeration of the lungs compared to 03/28/2012.   Original Report Authenticated By: Britta Mccreedy, M.D.    Medications: Scheduled Meds:   . antiseptic oral rinse  15 mL Mouth Rinse BID  . aspirin  81 mg Oral Daily  . atorvastatin  20 mg Oral q1800  . clopidogrel  75 mg Oral Q breakfast  . demeclocycline  300 mg Oral BID  . enoxaparin (LOVENOX) injection  40 mg Subcutaneous Q24H  . furosemide  40 mg Oral BID  . gabapentin  600 mg Oral BID  . isosorbide mononitrate  60 mg Oral Daily  . lidocaine  1 patch Transdermal Q24H  . losartan  50 mg Oral BID  . metoprolol tartrate  12.5 mg Oral TID  . nicotine  21 mg Transdermal Daily  . pantoprazole  40 mg Oral Q1200  . potassium chloride  20 mEq Oral Daily  . potassium chloride  40 mEq Oral Once  . tiotropium  18 mcg Inhalation Daily   Continuous Infusions:  PRN Meds:.sodium chloride, acetaminophen, albuterol, fentaNYL, levalbuterol, morphine injection, nitroGLYCERIN, ondansetron (ZOFRAN) IV, traMADol   This is a Psychologist, occupational Note.  The care of the patient was discussed with Dr. Briant Cedar and the assessment and plan formulated with their assistance.  Please see their attached note for official documentation of the daily encounter.  Aniceto Boss, MA, PA-S2 03/31/2012, 9:43 AM Pt seen and examined and note reviewed.  Sna sl improved.  He is feeling much better today.  Cont with lasix and demeclocyline.  Follow Sna daily. Replace K.

## 2012-03-31 NOTE — Progress Notes (Signed)
Physical Therapy Treatment Patient Details Name: [name] [name] MRN: [id number] DOB: [birth year] Today's Date: 03/31/2012 Time: 7829-5621 PT Time Calculation (min): 26 min  PT Assessment / Plan / Recommendation Comments on Treatment Session  Pt states he is feeling much better today & breathing has improved.   Performed entire therapy session on 4L 02.  02 sats dropped to 87-89% 4 L while ambulating.      Follow Up Recommendations  No PT follow up;Supervision - Intermittent     Does the patient have the potential to tolerate intense rehabilitation     Barriers to Discharge        Equipment Recommendations  None recommended by PT    Recommendations for Other Services    Frequency Min 3X/week   Plan Discharge plan remains appropriate;Frequency remains appropriate    Precautions / Restrictions Restrictions Weight Bearing Restrictions: No       Mobility  Bed Mobility Bed Mobility: Supine to Sit;Sitting - Scoot to Edge of Bed Supine to Sit: 7: Independent Sitting - Scoot to Edge of Bed: 7: Independent Transfers Transfers: Sit to Stand;Stand to Sit Sit to Stand: 7: Independent;With upper extremity assist;From bed;From chair/3-in-1;From toilet Stand to Sit: 7: Independent;To toilet;To chair/3-in-1 Ambulation/Gait Ambulation/Gait Assistance: 5: Supervision Ambulation Distance (Feet): 500 Feet Assistive device: None Ambulation/Gait Assistance Details: Pt ambulates well with smooth, steady gait.  Supervision for safety.   Gait Pattern: Within Functional Limits Stairs: No Wheelchair Mobility Wheelchair Mobility: No      PT Goals Acute Rehab PT Goals Time For Goal Achievement: 03/27/12 Potential to Achieve Goals: Good Pt will go Sit to Stand: with modified independence PT Goal: Sit to Stand - Progress: Met Pt will go Stand to Sit: with modified independence PT Goal: Stand to Sit - Progress: Met Pt will Transfer Bed to Chair/Chair to Bed: with modified independence Pt  will Ambulate: >150 feet;with modified independence;with least restrictive assistive device;Other (comment) PT Goal: Ambulate - Progress: Progressing toward goal Pt will Perform Home Exercise Program: Independently  Visit Information  Last PT Received On: 03/31/12 Assistance Needed: +1    Subjective Data  Subjective: "I havent walked in a few days"   Cognition  Overall Cognitive Status: Appears within functional limits for tasks assessed/performed Arousal/Alertness: Awake/alert Orientation Level: Appears intact for tasks assessed Behavior During Session: [facility] for tasks performed    Balance     End of Session PT - End of Session Equipment Utilized During Treatment: Gait belt;Oxygen Activity Tolerance: Patient tolerated treatment well;Patient limited by fatigue Patient left: in chair;with call bell/phone within reach;with family/visitor present Nurse Communication: Mobility status     Verdell Face, [place] [phone] 03/31/2012

## 2012-04-01 ENCOUNTER — Inpatient Hospital Stay (HOSPITAL_COMMUNITY): Payer: Medicare Other

## 2012-04-01 LAB — BASIC METABOLIC PANEL
CO2: 33 mEq/L — ABNORMAL HIGH (ref 19–32)
Calcium: 8.9 mg/dL (ref 8.4–10.5)
Chloride: 88 mEq/L — ABNORMAL LOW (ref 96–112)
Creatinine, Ser: 1.3 mg/dL (ref 0.50–1.35)
GFR calc Af Amer: 63 mL/min — ABNORMAL LOW (ref >90)
Sodium: 128 mEq/L — ABNORMAL LOW (ref 135–145)

## 2012-04-01 MED ORDER — METOPROLOL TARTRATE 12.5 MG HALF TABLET
12.5000 mg | ORAL_TABLET | Freq: Two times a day (BID) | ORAL | Status: DC
  Administered 2012-04-02: 12.5 mg via ORAL
  Filled 2012-04-01 (×4): qty 1

## 2012-04-01 MED ORDER — GUAIFENESIN-DM 100-10 MG/5ML PO SYRP
5.0000 mL | ORAL_SOLUTION | ORAL | Status: DC | PRN
  Administered 2012-04-01: 5 mL via ORAL
  Filled 2012-04-01: qty 5

## 2012-04-01 MED ORDER — FUROSEMIDE 40 MG PO TABS
40.0000 mg | ORAL_TABLET | Freq: Every day | ORAL | Status: DC
  Administered 2012-04-02 – 2012-04-06 (×5): 40 mg via ORAL
  Filled 2012-04-01 (×6): qty 1

## 2012-04-01 MED ORDER — ISOSORBIDE MONONITRATE ER 30 MG PO TB24
30.0000 mg | ORAL_TABLET | Freq: Every day | ORAL | Status: DC
  Administered 2012-04-02 – 2012-04-06 (×5): 30 mg via ORAL
  Filled 2012-04-01 (×5): qty 1

## 2012-04-01 NOTE — Progress Notes (Signed)
[facility] DAILY PROGRESS NOTE  NAME:  [name] [name]   MRN: [id number] DOB:  08/12/1940   ADMIT DATE: 03/16/2012   Patient Description   71 y.o. male with PMH below: presented with worsening SOB - found to have combination CHF & COPD in setting of Inferior STEMI - unsuccessful attempt at RCA revascularization.    Clinical Course:   Continued Hypoxia during post MI period despite diuresis complicated by Hyponatremia.  Acute decompensation on Monday 10/7 after gentle IVF overnight 10/6 to Rx Sodium level.  Much improved after re-initiation of diuresis --but most notably following large volume Thoracentecis on 10/9.    Length of Stay:  LOS: 16 days   Subjective:   Today Fermin Schwab looks much improved.  I saw him while walking briskly in the hall with PT on 2L O2.  No CP or SOB. SaO2 decreased after walking while talking to 87%, but rapidly increased after deep breathing.  Also back pain is much more controlled on Lidoderm patch.  Delerium also seems improved.  Objective:  Temp:  [98 F (36.7 C)-98.6 F (37 C)] 98.3 F (36.8 C) (10/11 0800) Pulse Rate:  [51-71] 55  (10/11 0800) Resp:  [16-19] 19  (10/11 0800) BP: (93-115)/(46-65) 98/55 mmHg (10/11 0800) SpO2:  [90 %-98 %] 93 % (10/11 0929) Weight:  [58.378 kg (128 lb 11.2 oz)] 58.378 kg (128 lb 11.2 oz) (10/11 0500) Weight change: -0.622 kg (-1 lb 5.9 oz) Physical Exam: General appearance: alert, cooperative, no distress and frail appearing -- but notably improved from earlier in the week Lungs: diminished breath sounds base - right, rales bibasilar and non-labored despite baseline accessory muscle use from COPD.  prolonged expiratory phase Heart: regular rate and rhythm, S1, S2 normal, no murmur, click, rub or gallop Abdomen: soft, non-tender; bowel sounds normal; no masses,  no organomegaly and protuberant Extremities: extremities normal, atraumatic, no cyanosis or edema Pulses: 2+ and  symmetric Neurologic: Grossly normal  Intake/Output from previous day: 10/10 0701 - 10/11 0700 In: 1290 [P.O.:1290] Out: 1350 [Urine:1350]  Intake/Output Summary (Last 24 hours) at 04/01/12 0953 Last data filed at 04/01/12 0300  Gross per 24 hour  Intake    690 ml  Output   1150 ml  Net   -460 ml     Lab 04/01/12 0435 03/31/12 0440 03/30/12 0420  NA 128* 126* 123*  K 4.1 3.3* 3.5  CL 88* 85* 81*  CO2 33* 31 35*  BUN 21 19 16   CREATININE 1.30 1.14 1.15     Lab 03/28/12 1253 03/28/12 0625  WBC 13.4* 14.2*  HGB 11.6* 10.6*  HCT 33.4* 31.1*  PLT 394 435*   No components found with this basename: TROP:3, BNP:3  Imaging: No new imaging  MAR Reviewed  Assessment/Plan:  Principal Problem:  *STEMI, unsuccessful RCA PCI 03/17/12 Active Problems:  Tobacco abuse  Acute respiratory failure, on admit and recurrent 03/28/12  Acute pulmonary edema  Hyponatremia, acute on chronic. ? secondary to volume vs SIADH vs drug related  COPD with respiratory failure, acute  PVD, Lt Ax-fem and fem-fem BPG 2/12, Dr Manson Passey follows  HTN (hypertension)  Dyslipidemia  History of CVA (cerebrovascular accident)  PAF, post MI,  on Amiodarone  NSVT, post MI  Ischemic cardiomyopathy, EF 30-35% by echo 03/29/12  Pericardial effusion, moderate on echo 03/29/12  Diastolic dysfunction, grade 2  Pleural effusion Intermittent Delerium -- improved  Feels much better post thoracentesis --> doubt that the effusion was  exudative.  No evidence of infection, ?? If partially related to mild Dresslers with small pericardial effusion.  Continues to diurese some on PO lasix, & Na improved to 128.  Continue Demeclocycline & follow Nephrology recommendations.  Continue PO Lasix given decreased EF & effusion.   Incentive Spirometry. On DAPT for CAD.  Remains hemodynamically stable on low dose BB, Imdur & ARB. No further angina.  Unable to further titrate medications due to BPs in high 90s-100s & HR in  50s. No further arrythmia off of Amiodarone -- Keep K+>4.0 Continue COPD meds  With continued O2 requirement -- will need d/c on home O2. Back pain is most likely 2/2 compression fractures -- notably improved on lidoderm patch.   Nicoderm patch for Smoking cessation.   Continue Neurontin.  DVT prophylaxis - SQ Lovenox GI prophylaxis PPI - pantoprazole due to con-committant clopidogrel.  Transfer to Telemetry today -- anticipate d/c home soon with LifeVest -- EF 35%(do not anticipate EF improving beyond current level with unsuccessful revascualrization).  Time Spent Directly with Patient:  10 minutes   Vonn Sliger W, M.D., M.S. [facility] [address] [place], [place]  [address]  [phone]  04/01/2012 9:53 AM

## 2012-04-01 NOTE — Progress Notes (Signed)
Mr Koci;s B/P dropped this afternoon into the 80s systolic. I have cut back his Imdur, Lasix, and beta blocker (HR 50). Will keep him in stepdown today and re evaluated in am.  Corine Shelter PA-C 04/01/2012 4:22 PM

## 2012-04-01 NOTE — Progress Notes (Signed)
All goals met. PT signing off.  04/01/2012 [facility] DPT PAGER: [phone] OFFICE: [phone]

## 2012-04-01 NOTE — Progress Notes (Signed)
Physical Therapy Treatment Patient Details Name: [name] [name] MRN: [id number] DOB: 1940/09/20 Today's Date: 04/01/2012 Time: 4098-1191 PT Time Calculation (min): 28 min  PT Assessment / Plan / Recommendation Comments on Treatment Session  Pt able to tolerate PT session with decreased 02 support (2 L).   Pt has met PT goals at this time.  Moves very well.  Independent with bed mobility, transfers, & (S) for ambulation.  PT services are now signing off.      Follow Up Recommendations  No PT follow up;Supervision - Intermittent     Does the patient have the potential to tolerate intense rehabilitation     Barriers to Discharge        Equipment Recommendations  None recommended by PT    Recommendations for Other Services    Frequency Min 3X/week   Plan Discharge plan remains appropriate;Frequency remains appropriate    Precautions / Restrictions Restrictions Weight Bearing Restrictions: No   Pertinent Vitals/Pain 02 sats throughout session:   Upon arrival: 3 L.  99%.   Ambulation:  2 L (per RN request).  86-93% End of session:  RA.  96%.      Mobility  Bed Mobility Bed Mobility: Supine to Sit;Sitting - Scoot to Edge of Bed Supine to Sit: 7: Independent Sitting - Scoot to [place] of Bed: 7: Independent Transfers Transfers: Sit to Stand;Stand to Sit Sit to Stand: 7: Independent Stand to Sit: 7: Independent Ambulation/Gait Ambulation/Gait Assistance: 7: Independent Ambulation Distance (Feet): 800 Feet (2900 unit x 2) Assistive device: None Ambulation/Gait Assistance Details: Pt tolerated & did well with gait challenges such as head turns, directional changes, gait speed changes, & stop/go on command.  ambulated with 2 L supplemental 02.  02 Sats dropped to 86% but quickly returned to 92-93% with standing rest break.  Pt does not c/o any SOB.   Gait Pattern: Within Functional Limits Stairs: No Wheelchair Mobility Wheelchair Mobility: No      PT Goals Acute Rehab PT  Goals Time For Goal Achievement: 03/27/12 Potential to Achieve Goals: Good Pt will go Sit to Stand: with modified independence PT Goal: Sit to Stand - Progress: Met Pt will go Stand to Sit: with modified independence PT Goal: Stand to Sit - Progress: Met Pt will Transfer Bed to Chair/Chair to Bed: with modified independence Pt will Ambulate: >150 feet;with modified independence;with least restrictive assistive device;Other (comment) PT Goal: Ambulate - Progress: Met Pt will Perform Home Exercise Program: Independently  Visit Information  Last PT Received On: 04/01/12 Assistance Needed: +1    Subjective Data      Cognition  Overall Cognitive Status: Appears within functional limits for tasks assessed/performed Arousal/Alertness: Awake/alert Orientation Level: Appears intact for tasks assessed Behavior During Session: [facility] for tasks performed    Balance     End of Session PT - End of Session Equipment Utilized During Treatment: Gait belt;Oxygen Activity Tolerance: Patient tolerated treatment well;Patient limited by fatigue Patient left: in chair;with call bell/phone within reach Nurse Communication: Mobility status    Verdell Face, [place] [phone] 04/01/2012

## 2012-04-01 NOTE — Progress Notes (Signed)
PA Student Daily Progress Note [facility] Subjective: Pt states he had a rough night due to nasal congestion but is feeling better overall. Denies headache, dizziness, SOB, CP, and abd pain. No new complaints.  Objective:  Filed Vitals:   03/31/12 2125 04/01/12 0000 04/01/12 0400 04/01/12 0500  BP: 115/60 95/65 101/49   Pulse: 55 54 58   Temp:  98.6 F (37 C) 98.1 F (36.7 C)   TempSrc:  Oral Oral   Resp:  18 16   Height:      Weight:    58.378 kg (128 lb 11.2 oz)  SpO2:  95% 97%    Physical Exam: General appearance: Sitting up in bed watching tv, NAD  Lungs: diminished breath sounds bilaterally, CTA bilaterally  Heart: regular rate and rhythm, S1, S2 normal, no murmur, click, rub or gallop  Abdomen: soft, non-tender; bowel sounds normal; no masses, no organomegaly  Extremities: extremities normal, atraumatic, no cyanosis or edema  Neuro: grossly intact  Assessment/Plan: [name] [name] is a 71 yo male with a PMH of HNT, COPD (current smoker-50 pack year history), CVA (2004), PVD, Hyperlipidemia, and femoral bi-pass in 2012.  1. Hyponatremia- review of previous records indicates a Na baseline of 128-134. Pts Na has declined over admission from 131 to 121 yesterday. 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 CXR shows edema. This is likely volume related. Today is 128 up from 126 yesterday which is at the low end of his baseline.   - Continue Lasix PO   - Fluid restrict PO to 1L per day   - Continue to monitor  2. STEMI with unsuccessful RCA PCI- primary team was unable to place stent despite multiple attempts.   - Medical management per primary team  3. COPD- pt current smoker with 50 pack year history who presented in acute resp failure and had to be intubated.   - Smoking cessation counseling   - pt will likely need home O2 given current needs during admission   - continue per primary team 4. Hypokalemia- resolved today to 4.1. Continue supplementation    Labs: Basic Metabolic Panel:  Lab 04/01/12 8119 03/31/12 0440 03/30/12 0420 03/29/12 0522  NA 128* 126* 123* --  K 4.1 3.3* 3.5 --  CL 88* 85* 81* --  CO2 33* 31 35* --  GLUCOSE 89 87 92 --  BUN 21 19 16  --  CREATININE 1.30 1.14 1.15 --  CALCIUM 8.9 8.5 8.6 --  ALB -- -- -- --  PHOS -- -- -- 4.0   Liver Function Tests:  Lab 03/30/12 1417 03/29/12 0522  AST -- --  ALT -- --  ALKPHOS -- --  BILITOT -- --  PROT 6.9 --  ALBUMIN -- 3.4*   No results found for this basename: LIPASE:3,AMYLASE:3 in the last 168 hours No results found for this basename: AMMONIA:3 in the last 168 hours INR: @resultsinr3 @ CBC:  Lab 03/28/12 1253 03/28/12 0625  WBC 13.4* 14.2*  NEUTROABS -- --  HGB 11.6* 10.6*  HCT 33.4* 31.1*  MCV 88.6 89.6  PLT 394 435*   Blood Culture    Component Value Date/Time   SDES FLUID LEFT PLEURAL 03/30/2012 1330   SDES FLUID LEFT PLEURAL 03/30/2012 1330   SDES FLUID LEFT PLEURAL 03/30/2012 1330   SPECREQUEST Normal 03/30/2012 1330   SPECREQUEST Normal 03/30/2012 1330   SPECREQUEST Normal 03/30/2012 1330   CULT CULTURE WILL BE EXAMINED FOR 6 WEEKS BEFORE ISSUING A FINAL REPORT 03/30/2012  1330   CULT NO GROWTH 1 DAY 03/30/2012 1330   REPTSTATUS PENDING 03/30/2012 1330   REPTSTATUS PENDING 03/30/2012 1330   REPTSTATUS 03/31/2012 FINAL 03/30/2012 1330    Cardiac Enzymes:  Lab 03/28/12 2157 03/28/12 1744 03/28/12 1158 03/26/12 1247 03/26/12 0500 03/25/12 1230  CKTOTAL -- -- 140 128 -- 108  CKMB -- -- 9.0* 5.5* -- 4.8*  CKMBINDEX -- -- -- -- -- --  TROPONINI 0.62* 0.46* 0.69* 1.24* 1.58* --   CBG: No results found for this basename: GLUCAP:5 in the last 168 hours Iron Studies: No results found for this basename: IRON,TIBC,TRANSFERRIN,FERRITIN in the last 72 hours  Micro Results: Recent Results (from the past 240 hour(s))  CULTURE, EXPECTORATED SPUTUM-ASSESSMENT     Status: Normal   Collection Time   03/27/12 11:17 AM      Component Value Range Status Comment    Specimen Description SPUTUM   Final    Special Requests NONE   Final    Sputum evaluation     Final    Value: THIS SPECIMEN IS ACCEPTABLE. RESPIRATORY CULTURE REPORT TO FOLLOW.   Report Status 03/27/2012 FINAL   Final   CULTURE, RESPIRATORY     Status: Normal   Collection Time   03/27/12 11:17 AM      Component Value Range Status Comment   Specimen Description SPUTUM   Final    Special Requests NONE   Final    Gram Stain     Final    Value: NO WBC SEEN     RARE SQUAMOUS EPITHELIAL CELLS PRESENT     RARE GRAM POSITIVE RODS     RARE GRAM POSITIVE COCCI     IN PAIRS RARE GRAM NEGATIVE RODS   Culture NORMAL OROPHARYNGEAL FLORA   Final    Report Status 03/29/2012 FINAL   Final   AFB CULTURE WITH SMEAR     Status: Normal (Preliminary result)   Collection Time   03/30/12  1:30 PM      Component Value Range Status Comment   Specimen Description FLUID LEFT PLEURAL   Final    Special Requests Normal   Final    ACID FAST SMEAR NO ACID FAST BACILLI SEEN   Final    Culture     Final    Value: CULTURE WILL BE EXAMINED FOR 6 WEEKS BEFORE ISSUING A FINAL REPORT   Report Status PENDING   Incomplete   BODY FLUID CULTURE     Status: Normal (Preliminary result)   Collection Time   03/30/12  1:30 PM      Component Value Range Status Comment   Specimen Description FLUID LEFT PLEURAL   Final    Special Requests Normal   Final    Gram Stain     Final    Value: NO WBC SEEN     NO ORGANISMS SEEN   Culture NO GROWTH 1 DAY   Final    Report Status PENDING   Incomplete   FUNGAL STAIN     Status: Normal   Collection Time   03/30/12  1:30 PM      Component Value Range Status Comment   Specimen Description FLUID LEFT PLEURAL   Final    Special Requests Normal   Final    Fungal Smear NO YEAST OR FUNGAL ELEMENTS SEEN   Final    Report Status 03/31/2012 FINAL   Final    Studies/Results: Dg Chest Port 1 View  03/30/2012  *RADIOLOGY REPORT*  Clinical Data:  Post left thoracentesis.  PORTABLE CHEST - 1  VIEW  Comparison: 03/30/2012 and CT chest 03/28/2012.  Findings: Trachea is midline.  Heart is enlarged.  Thoracic aorta is calcified.  Biapical pleural parenchymal scarring.  Bibasilar air space disease with small bilateral pleural effusions.  Left pleural effusion has decreased slightly in the interval after left thoracentesis.  No definite pneumothorax.  Old left rib fractures.  Surgical clips project over the upper left chest.  IMPRESSION:  1.  Interval decrease in size of left pleural effusion after thoracentesis.  No pneumothorax. 2. Congestive heart failure.   Original Report Authenticated By: Reyes Ivan, M.D.    Dg Chest Port 1 View  03/30/2012  *RADIOLOGY REPORT*  Clinical Data: Evaluate pleural effusion.  PORTABLE CHEST - 1 VIEW  Comparison: Chest radiograph 03/28/2012 and CT angio chest 03/28/2012  Findings: Stable enlargement cardiopericardial silhouette.  Diffuse pulmonary congestion prominence and bibasilar/perihilar airspace disease.  There are bilateral pleural effusions, left greater than right. Effusions appear without significant change compared to the portable chest radiograph of 03/28/2012.  Surgical clips project over the left axilla.  High-riding right humeral head suggest chronic rotator cuff disease.  IMPRESSION: Congestive heart failure pattern with bilateral pleural effusions. No significant change in aeration of the lungs compared to 03/28/2012.   Original Report Authenticated By: Britta Mccreedy, M.D.    Medications: Scheduled Meds:   . antiseptic oral rinse  15 mL Mouth Rinse BID  . aspirin  81 mg Oral Daily  . atorvastatin  20 mg Oral q1800  . clopidogrel  75 mg Oral Q breakfast  . demeclocycline  300 mg Oral BID  . enoxaparin (LOVENOX) injection  40 mg Subcutaneous Q24H  . furosemide  40 mg Oral BID  . gabapentin  600 mg Oral BID  . isosorbide mononitrate  60 mg Oral Daily  . lidocaine  1 patch Transdermal Q24H  . losartan  50 mg Oral BID  . metoprolol tartrate   12.5 mg Oral TID  . nicotine  21 mg Transdermal Daily  . pantoprazole  40 mg Oral Q1200  . potassium chloride  20 mEq Oral Daily  . potassium chloride  40 mEq Oral Once  . tiotropium  18 mcg Inhalation Daily   Continuous Infusions:  PRN Meds:.sodium chloride, acetaminophen, albuterol, fentaNYL, levalbuterol, morphine injection, nitroGLYCERIN, ondansetron (ZOFRAN) IV, traMADol   This is a Psychologist, occupational Note.  The care of the patient was discussed with Dr. Briant Cedar and the assessment and plan formulated with their assistance.  Please see their attached note for official documentation of the daily encounter.  Aniceto Boss, MA, PA-S2 04/01/2012, 8:17 AM Pt seen and examined and note reviewed.  SNa improving and close to his baseline.  Blood pressure is low and that will effect free water excretion so will DC cozaar.  If SNa remains stable or improved over next 1-2 days he could go home from my standpoint.

## 2012-04-02 ENCOUNTER — Encounter (HOSPITAL_COMMUNITY): Payer: Self-pay

## 2012-04-02 ENCOUNTER — Inpatient Hospital Stay (HOSPITAL_COMMUNITY): Payer: Medicare Other

## 2012-04-02 LAB — BASIC METABOLIC PANEL
CO2: 33 mEq/L — ABNORMAL HIGH (ref 19–32)
Calcium: 8.9 mg/dL (ref 8.4–10.5)
GFR calc non Af Amer: 53 mL/min — ABNORMAL LOW (ref >90)
Glucose, Bld: 88 mg/dL (ref 70–99)
Potassium: 4.3 mEq/L (ref 3.5–5.1)
Sodium: 131 mEq/L — ABNORMAL LOW (ref 135–145)

## 2012-04-02 LAB — TROPONIN I
Troponin I: <0.3 ng/mL (ref <0.30)
Troponin I: <0.3 ng/mL (ref <0.30)
Troponin I: <0.3 ng/mL (ref <0.30)

## 2012-04-02 MED ORDER — FUROSEMIDE 10 MG/ML IJ SOLN
40.0000 mg | Freq: Once | INTRAMUSCULAR | Status: AC
  Administered 2012-04-02: 40 mg via INTRAVENOUS
  Filled 2012-04-02: qty 4

## 2012-04-02 MED ORDER — SPIRONOLACTONE 12.5 MG HALF TABLET
12.5000 mg | ORAL_TABLET | Freq: Every day | ORAL | Status: DC
  Administered 2012-04-02 – 2012-04-03 (×2): 12.5 mg via ORAL
  Filled 2012-04-02 (×3): qty 1

## 2012-04-02 NOTE — Progress Notes (Signed)
Subjective:  SOB this am, O2 sats dropped and pt had to go back on O2.  Objective:  Vital Signs in the last 24 hours: Temp:  [97.5 F (36.4 C)-98.1 F (36.7 C)] 98.1 F (36.7 C) (10/12 0400) Pulse Rate:  [43-64] 57  (10/12 0400) Resp:  [14-18] 18  (10/12 0400) BP: (81-109)/(41-60) 109/59 mmHg (10/12 0400) SpO2:  [88 %-97 %] 96 % (10/12 0737) Weight:  [58.65 kg (129 lb 4.8 oz)] 58.65 kg (129 lb 4.8 oz) (10/12 0500)  Intake/Output from previous day:  Intake/Output Summary (Last 24 hours) at 04/02/12 0805 Last data filed at 04/02/12 0500  Gross per 24 hour  Intake    390 ml  Output    520 ml  Net   -130 ml      . antiseptic oral rinse  15 mL Mouth Rinse BID  . aspirin  81 mg Oral Daily  . atorvastatin  20 mg Oral q1800  . clopidogrel  75 mg Oral Q breakfast  . demeclocycline  300 mg Oral BID  . enoxaparin (LOVENOX) injection  40 mg Subcutaneous Q24H  . furosemide  40 mg Intravenous Once  . furosemide  40 mg Oral Daily  . gabapentin  600 mg Oral BID  . isosorbide mononitrate  30 mg Oral Daily  . lidocaine  1 patch Transdermal Q24H  . metoprolol tartrate  12.5 mg Oral BID  . nicotine  21 mg Transdermal Daily  . pantoprazole  40 mg Oral Q1200  . potassium chloride  20 mEq Oral Daily  . tiotropium  18 mcg Inhalation Daily  . DISCONTD: furosemide  40 mg Oral BID  . DISCONTD: isosorbide mononitrate  60 mg Oral Daily  . DISCONTD: losartan  50 mg Oral BID  . DISCONTD: metoprolol tartrate  12.5 mg Oral TID   Physical Exam: General appearance: alert, cooperative and no distress Lungs: decreased breath sounds Lt 1/3 Heart: regular rate and rhythm 1/6 SEM Abs: soft BS+ No ankle edema   Rate: 58  Rhythm: sinus bradycardia  Lab Results: No results found for this basename: WBC:2,HGB:2,PLT:2 in the last 72 hours  Basename 04/02/12 0445 04/01/12 0435  NA 131* 128*  K 4.3 4.1  CL 90* 88*  CO2 33* 33*  GLUCOSE 88 89  BUN 24* 21  CREATININE 1.33 1.30   No results found  for this basename: TROPONINI:2,CK,MB:2 in the last 72 hours Hepatic Function Panel  Basename 03/30/12 1417  PROT 6.9  ALBUMIN --  AST --  ALT --  ALKPHOS --  BILITOT --  BILIDIR --  IBILI --    Basename 03/30/12 1416  CHOL 103   No results found for this basename: INR in the last 72 hours  Imaging: Imaging results have been reviewed CXR: Patchy left lung opacities, favored to reflect asymmetric edema,  infection not excluded.  Cardiomegaly with small bilateral pleural effusions, left greater  than right.   Cardiac Studies:  Assessment/Plan:   Principal Problem:  *STEMI, unsuccessful RCA PCI 03/17/12 Active Problems:  Acute respiratory failure, on admit and recurrent 03/28/12  Acute pulmonary edema  Hyponatremia, acute on chronic. ? secondary to volume vs SIADH vs drug related  COPD with respiratory failure, acute  PAF, post MI,  on Amiodarone  NSVT, post MI  Ischemic cardiomyopathy, EF 30-35% by echo 03/29/12  Pericardial effusion, moderate on echo 03/29/12  Tobacco abuse  PVD, Lt Ax-fem and fem-fem BPG 2/12, Dr Manson Passey follows  HTN (hypertension)  Dyslipidemia  History of CVA (  cerebrovascular accident)  Diastolic dysfunction, grade 2  Pleural effusion  Plan- Yesterday afternoon he was off O2 for awhile, doing well. His B/P and HR were low and his medications were cut back. CXR today shows recurrent Lt effusion and CHF. He received 40mg  Lasix 4am, will repeat this dose IV this am.     Corine Shelter PA-C 04/02/2012, 8:05 AM    Patient seen and examined. Agree with assessment and plan. X ray reviewed. Will try adding very low dose spironolactone at 12.5 mg daily, f/u Bmet in am, and titrate if labs and BP allow.   Lennette Bihari, MD, [facility] 04/02/2012 8:30 AM

## 2012-04-02 NOTE — Progress Notes (Addendum)
S:feels well  No new CO O:BP 109/59  Pulse 57  Temp 98.1 F (36.7 C) (Oral)  Resp 18  Ht 5\' 8"  (1.727 m)  Wt 58.65 kg (129 lb 4.8 oz)  BMI 19.66 kg/m2  SpO2 97%  Intake/Output Summary (Last 24 hours) at 04/02/12 1610 Last data filed at 04/02/12 0500  Gross per 24 hour  Intake    390 ml  Output    520 ml  Net   -130 ml   Weight change: 0.272 kg (9.6 oz) RUE:AVWUJ and alert CVS:RRR Resp:exp wheezes Abd:+BS NTND Ext:no edema NEURO:CN ox3 no asterixis      . antiseptic oral rinse  15 mL Mouth Rinse BID  . aspirin  81 mg Oral Daily  . atorvastatin  20 mg Oral q1800  . clopidogrel  75 mg Oral Q breakfast  . demeclocycline  300 mg Oral BID  . enoxaparin (LOVENOX) injection  40 mg Subcutaneous Q24H  . furosemide  40 mg Oral Daily  . gabapentin  600 mg Oral BID  . isosorbide mononitrate  30 mg Oral Daily  . lidocaine  1 patch Transdermal Q24H  . metoprolol tartrate  12.5 mg Oral BID  . nicotine  21 mg Transdermal Daily  . pantoprazole  40 mg Oral Q1200  . potassium chloride  20 mEq Oral Daily  . tiotropium  18 mcg Inhalation Daily  . DISCONTD: furosemide  40 mg Oral BID  . DISCONTD: isosorbide mononitrate  60 mg Oral Daily  . DISCONTD: losartan  50 mg Oral BID  . DISCONTD: metoprolol tartrate  12.5 mg Oral TID   Dg Chest 2 View  04/01/2012  *RADIOLOGY REPORT*  Clinical Data:  Reevaluate pleural effusion and pulmonary edema status post thoracentesis  CHEST - 2 VIEW  Comparison: Prior chest x-ray 03/30/2012  Findings: Improving pulmonary edema.  The right pleural effusion is also significantly decreased in size.  There are small residual pleural effusions and associated bibasilar opacities.  Unchanged cardiomegaly and atherosclerosis of the thoracic aorta.  Surgical clips project over the left apex.  IMPRESSION:  1.  Improving pulmonary edema and significantly decreased right pleural effusion following thoracentesis. 2.  Small bilateral effusions and associated bibasilar  opacities persist.   Original Report Authenticated By: Sterling Big, M.D.    Dg Chest Port 1 View  04/02/2012  *RADIOLOGY REPORT*  Clinical Data: Hypoxia, recent thoracentesis  PORTABLE CHEST - 1 VIEW  Comparison: 04/01/2012  Findings: Patchy left lung opacities, favored to reflect asymmetric edema, infection not excluded.  Small bilateral pleural effusions, left greater than right. No pneumothorax is seen.  Stable cardiomegaly.  Old deformity of the left lateral clavicle.  Surgical clips overlying the left upper hemithorax.  IMPRESSION: Patchy left lung opacities, favored to reflect asymmetric edema, infection not excluded.  Cardiomegaly with small bilateral pleural effusions, left greater than right.   Original Report Authenticated By: Charline Bills, M.D.    BMET    Component Value Date/Time   NA 131* 04/02/2012 0445   K 4.3 04/02/2012 0445   CL 90* 04/02/2012 0445   CO2 33* 04/02/2012 0445   GLUCOSE 88 04/02/2012 0445   BUN 24* 04/02/2012 0445   CREATININE 1.33 04/02/2012 0445   CALCIUM 8.9 04/02/2012 0445   GFRNONAA 53* 04/02/2012 0445   GFRAA 61* 04/02/2012 0445   CBC    Component Value Date/Time   WBC 13.4* 03/28/2012 1253   RBC 3.77* 03/28/2012 1253   HGB 11.6* 03/28/2012 1253   HCT 33.4*  03/28/2012 1253   PLT 394 03/28/2012 1253   MCV 88.6 03/28/2012 1253   MCH 30.8 03/28/2012 1253   MCHC 34.7 03/28/2012 1253   RDW 13.3 03/28/2012 1253   LYMPHSABS 0.9 03/16/2012 0937   MONOABS 2.2* 03/16/2012 0937   EOSABS 0.0 03/16/2012 0937   BASOSABS 0.0 03/16/2012 0937     Assessment: 1. Hyponatremia, improving 2. STEMI 3. COPD 4. Volume overload, improved  Plan: 1. Cont demeclo and lasix.  MAy decrease demeclo tomorrow to once a day if SNa cont to increase   Dragan Tamburrino T

## 2012-04-02 NOTE — Progress Notes (Signed)
[id number] No ambulation at this time, pt's BP 84/43 HR 46-51 SB. Pt denies sx's informed pt's covering nurse of low BP.

## 2012-04-02 NOTE — Progress Notes (Signed)
1010 Patient complained of neck stiffness does not want to walk right now.  Will check on patient later.

## 2012-04-03 ENCOUNTER — Inpatient Hospital Stay (HOSPITAL_COMMUNITY): Payer: Medicare Other

## 2012-04-03 ENCOUNTER — Encounter (HOSPITAL_COMMUNITY): Payer: Self-pay

## 2012-04-03 LAB — BASIC METABOLIC PANEL
BUN: 18 mg/dL (ref 6–23)
Calcium: 8.8 mg/dL (ref 8.4–10.5)
GFR calc Af Amer: 63 mL/min — ABNORMAL LOW (ref >90)
GFR calc non Af Amer: 54 mL/min — ABNORMAL LOW (ref >90)
Glucose, Bld: 95 mg/dL (ref 70–99)
Potassium: 4.6 mEq/L (ref 3.5–5.1)

## 2012-04-03 LAB — BODY FLUID CULTURE: Gram Stain: NONE SEEN

## 2012-04-03 MED ORDER — DEMECLOCYCLINE HCL 150 MG PO TABS
300.0000 mg | ORAL_TABLET | Freq: Every morning | ORAL | Status: DC
  Administered 2012-04-03 – 2012-04-06 (×4): 300 mg via ORAL
  Filled 2012-04-03 (×4): qty 2

## 2012-04-03 MED ORDER — METOPROLOL SUCCINATE 12.5 MG HALF TABLET
12.5000 mg | ORAL_TABLET | Freq: Every day | ORAL | Status: DC
  Administered 2012-04-03 – 2012-04-06 (×4): 12.5 mg via ORAL
  Filled 2012-04-03 (×4): qty 1

## 2012-04-03 MED ORDER — FUROSEMIDE 10 MG/ML IJ SOLN: 40.0000 mg | Freq: Once | INTRAMUSCULAR | Status: DC

## 2012-04-03 NOTE — Progress Notes (Signed)
[facility]  DAILY PROGRESS NOTE   Subjective:  Excellent diuresis. Feels well. No BMET yet this AM.  Objective:  Temp:  [97.5 F (36.4 C)-98.8 F (37.1 C)] 98.2 F (36.8 C) (10/13 0753) Pulse Rate:  [49-54] 52  (10/13 0405) Resp:  [17-20] 17  (10/12 1545) BP: (84-119)/(43-63) 104/51 mmHg (10/13 0405) SpO2:  [91 %-97 %] 97 % (10/13 0817) Weight:  [58.72 kg (129 lb 7.3 oz)] 58.72 kg (129 lb 7.3 oz) (10/13 0405) Weight change: 0.07 kg (2.5 oz)  Intake/Output from previous day: 10/12 0701 - 10/13 0700 In: 720 [P.O.:720] Out: 2425 [Urine:2425]  Intake/Output from this shift:    Medications: Current Facility-Administered Medications  Medication Dose Route Frequency Provider Last Rate Last Dose  . 0.9 %  sodium chloride infusion  250 mL Intravenous PRN Jeanella Craze, NP 10 mL/hr at 03/19/12 2305 250 mL at 03/19/12 2305  . acetaminophen (TYLENOL) tablet 650 mg  650 mg Oral Q4H PRN Corky Crafts, MD   650 mg at 04/02/12 2145  . albuterol (PROVENTIL) (5 MG/ML) 0.5% nebulizer solution 2.5 mg  2.5 mg Nebulization Q4H PRN Leslye Peer, MD   2.5 mg at 03/28/12 1003  . antiseptic oral rinse (BIOTENE) solution 15 mL  15 mL Mouth Rinse BID Runell Gess, MD   15 mL at 04/02/12 2200  . aspirin chewable tablet 81 mg  81 mg Oral Daily Corky Crafts, MD   81 mg at 04/02/12 0911  . atorvastatin (LIPITOR) tablet 20 mg  20 mg Oral q1800 Runell Gess, MD   20 mg at 04/02/12 1900  . clopidogrel (PLAVIX) tablet 75 mg  75 mg Oral Q breakfast Corky Crafts, MD   75 mg at 04/03/12 9147  . demeclocycline (DECLOMYCIN) tablet 300 mg  300 mg Oral q morning - 10a Dyke Maes, MD      . enoxaparin (LOVENOX) injection 40 mg  40 mg Subcutaneous Q24H Marty Heck, PHARMD   40 mg at 04/02/12 0910  . fentaNYL (SUBLIMAZE) injection 50 mcg  50 mcg Intravenous Q3H PRN Coralyn Helling, MD   50 mcg at 03/27/12 2334  . furosemide (LASIX) injection 40 mg   40 mg Intravenous Once Abelino Derrick, [place]   40 mg at 04/02/12 0910  . furosemide (LASIX) tablet 40 mg  40 mg Oral Daily Abelino Derrick, [place]   40 mg at 04/02/12 0534  . gabapentin (NEURONTIN) tablet 600 mg  600 mg Oral BID Corky Crafts, MD   600 mg at 04/02/12 2225  . guaiFENesin-dextromethorphan (ROBITUSSIN DM) 100-10 MG/5ML syrup 5 mL  5 mL Oral Q4H PRN Runell Gess, MD   5 mL at 04/01/12 2125  . isosorbide mononitrate (IMDUR) 24 hr tablet 30 mg  30 mg Oral Daily Eda Paschal [place], [place]   30 mg at 04/02/12 0910  . levalbuterol (XOPENEX) nebulizer solution 0.63 mg  0.63 mg Nebulization Q6H PRN Nada Boozer, NP   0.63 mg at 03/29/12 0630  . lidocaine (LIDODERM) 5 % 1 patch  1 patch Transdermal Q24H Marykay Lex, MD   1 patch at 04/02/12 1054  . metoprolol tartrate (LOPRESSOR) tablet 12.5 mg  12.5 mg Oral BID Eda Paschal [place], [place]   12.5 mg at 04/02/12 0911  . nicotine (NICODERM CQ - dosed in mg/24 hours) patch 21 mg  21 mg Transdermal Daily Runell Gess, MD   21 mg at 04/02/12 0900  .  nitroGLYCERIN (NITROSTAT) SL tablet 0.4 mg  0.4 mg Sublingual Q5 Min x 3 PRN Jeanella Craze, NP      . ondansetron (ZOFRAN) injection 4 mg  4 mg Intravenous Q6H PRN Corky Crafts, MD      . pantoprazole (PROTONIX) EC tablet 40 mg  40 mg Oral Q1200 Coralyn Helling, MD   40 mg at 04/02/12 1053  . potassium chloride SA (K-DUR,KLOR-CON) CR tablet 20 mEq  20 mEq Oral Daily Eda Paschal [place], [place]   20 mEq at 04/02/12 1914  . spironolactone (ALDACTONE) tablet 12.5 mg  12.5 mg Oral Daily Lennette Bihari, MD   12.5 mg at 04/02/12 1158  . tiotropium (SPIRIVA) inhalation capsule 18 mcg  18 mcg Inhalation Daily Nada Boozer, NP   18 mcg at 04/03/12 0813  . traMADol (ULTRAM) tablet 50 mg  50 mg Oral Q6H PRN Abelino Derrick, PA   50 mg at 04/02/12 1054  . DISCONTD: demeclocycline (DECLOMYCIN) tablet 300 mg  300 mg Oral BID Chrystie Nose, MD   300 mg at 04/02/12 2145    Physical Exam: General appearance: alert, cooperative and  no distress Neck: no adenopathy, no carotid bruit, no JVD, supple, symmetrical, trachea midline and thyroid not enlarged, symmetric, no tenderness/mass/nodules Lungs: diminished breath sounds bilaterally and no rales or wheezes Heart: regular rate and rhythm, S1, S2 normal, S4 present and palpable S4 Abdomen: soft, non-tender; bowel sounds normal; no masses,  no organomegaly Extremities: extremities normal, atraumatic, no cyanosis or edema Skin: Skin color, texture, turgor normal. No rashes or lesions Neurologic: Grossly normal  Lab Results: Results for orders placed during the hospital encounter of 03/16/12 (from the past 48 hour(s))  BASIC METABOLIC PANEL     Status: Abnormal   Collection Time   04/02/12  4:45 AM      Component Value Range Comment   Sodium 131 (*) 135 - 145 mEq/L    Potassium 4.3  3.5 - 5.1 mEq/L    Chloride 90 (*) 96 - 112 mEq/L    CO2 33 (*) 19 - 32 mEq/L    Glucose, Bld 88  70 - 99 mg/dL    BUN 24 (*) 6 - 23 mg/dL    Creatinine, Ser 7.82  0.50 - 1.35 mg/dL    Calcium 8.9  8.4 - 95.6 mg/dL    GFR calc non Af Amer 53 (*) >90 mL/min    GFR calc Af Amer 61 (*) >90 mL/min   TROPONIN I     Status: Normal   Collection Time   04/02/12  7:02 AM      Component Value Range Comment   Troponin I <0.30  <0.30 ng/mL   TROPONIN I     Status: Normal   Collection Time   04/02/12 12:10 PM      Component Value Range Comment   Troponin I <0.30  <0.30 ng/mL   TROPONIN I     Status: Normal   Collection Time   04/02/12  5:01 PM      Component Value Range Comment   Troponin I <0.30  <0.30 ng/mL     Imaging: Dg Chest 2 View  04/01/2012  *RADIOLOGY REPORT*  Clinical Data:  Reevaluate pleural effusion and pulmonary edema status post thoracentesis  CHEST - 2 VIEW  Comparison: Prior chest x-ray 03/30/2012  Findings: Improving pulmonary edema.  The right pleural effusion is also significantly decreased in size.  There are small residual pleural effusions and associated bibasilar  opacities.  Unchanged cardiomegaly and atherosclerosis of the thoracic aorta.  Surgical clips project over the left apex.  IMPRESSION:  1.  Improving pulmonary edema and significantly decreased right pleural effusion following thoracentesis. 2.  Small bilateral effusions and associated bibasilar opacities persist.   Original Report Authenticated By: Sterling Big, M.D.    Dg Chest Port 1 View  04/03/2012  *RADIOLOGY REPORT*  Clinical Data: Effusion  PORTABLE CHEST - 1 VIEW  Comparison: 04/02/2012  Findings: Cardiomegaly with suspected mild interstitial edema.  Moderate left pleural effusion, increased.  No pneumothorax.  IMPRESSION: Cardiomegaly with suspected mild interstitial edema.  Moderate left pleural effusion, increased.   Original Report Authenticated By: Charline Bills, M.D.    Dg Chest Port 1 View  04/02/2012  *RADIOLOGY REPORT*  Clinical Data: Hypoxia, recent thoracentesis  PORTABLE CHEST - 1 VIEW  Comparison: 04/01/2012  Findings: Patchy left lung opacities, favored to reflect asymmetric edema, infection not excluded.  Small bilateral pleural effusions, left greater than right. No pneumothorax is seen.  Stable cardiomegaly.  Old deformity of the left lateral clavicle.  Surgical clips overlying the left upper hemithorax.  IMPRESSION: Patchy left lung opacities, favored to reflect asymmetric edema, infection not excluded.  Cardiomegaly with small bilateral pleural effusions, left greater than right.   Original Report Authenticated By: Charline Bills, M.D.     Assessment:  1. Principal Problem: 2.  *STEMI, unsuccessful RCA PCI 03/17/12 3. Active Problems: 4.  Tobacco abuse 5.  Acute respiratory failure, on admit and recurrent 03/28/12 6.  Acute pulmonary edema 7.  Hyponatremia, acute on chronic. ? 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  COPD with respiratory failure, acute 9.  PVD, Lt Ax-fem and fem-fem BPG 2/12, Dr Manson Passey follows 10.  HTN (hypertension) 11.   Dyslipidemia 12.  History of CVA (cerebrovascular accident) 55.  PAF, post MI,  on Amiodarone 14.  NSVT, post MI 15.  Ischemic cardiomyopathy, EF 30-35% by echo 03/29/12 16.  Pericardial effusion, moderate on echo 03/29/12 17.  Diastolic dysfunction, grade 2 18.  Pleural effusion, tapped 1L 03/30/12 19.   Plan:  1. Improving CHF after 1.4 L net diuresis. Long discussion re: sodium restriction, daily weights, signs and sxs of CHF exacerbation, etc. 2. No postinfarction angina 3. Labs pending - Hyponatremia was getting better  4. He has findings of advanced emphysema - it will possibly be a future challenge to distinguish COPD from CHF exacerbation. 5. Bradycardia has limited beta blocker adm. Will reduce to 12.5 mg daily  Time Spent Directly with Patient:  30 minutes  Length of Stay:  LOS: 18 days    Stryder Poitra 04/03/2012, 8:43 AM

## 2012-04-03 NOTE — Progress Notes (Signed)
S:no new co.  Labs not back? O:BP 104/51  Pulse 52  Temp 98.2 F (36.8 C) (Oral)  Resp 17  Ht 5\' 8"  (1.727 m)  Wt 58.72 kg (129 lb 7.3 oz)  BMI 19.68 kg/m2  SpO2 97%  Intake/Output Summary (Last 24 hours) at 04/03/12 0812 Last data filed at 04/03/12 0500  Gross per 24 hour  Intake    720 ml  Output   2025 ml  Net  -1305 ml   Weight change: 0.07 kg (2.5 oz) ZOX:WRUEA and alert CVS:RRR Resp:no wheezes.  Few faint crackles Abd:+BS NTND Ext:no edema NEURO:CN ox3 no asterixis      . antiseptic oral rinse  15 mL Mouth Rinse BID  . aspirin  81 mg Oral Daily  . atorvastatin  20 mg Oral q1800  . clopidogrel  75 mg Oral Q breakfast  . demeclocycline  300 mg Oral BID  . enoxaparin (LOVENOX) injection  40 mg Subcutaneous Q24H  . furosemide  40 mg Intravenous Once  . furosemide  40 mg Oral Daily  . gabapentin  600 mg Oral BID  . isosorbide mononitrate  30 mg Oral Daily  . lidocaine  1 patch Transdermal Q24H  . metoprolol tartrate  12.5 mg Oral BID  . nicotine  21 mg Transdermal Daily  . pantoprazole  40 mg Oral Q1200  . potassium chloride  20 mEq Oral Daily  . spironolactone  12.5 mg Oral Daily  . tiotropium  18 mcg Inhalation Daily   Dg Chest 2 View  04/01/2012  *RADIOLOGY REPORT*  Clinical Data:  Reevaluate pleural effusion and pulmonary edema status post thoracentesis  CHEST - 2 VIEW  Comparison: Prior chest x-ray 03/30/2012  Findings: Improving pulmonary edema.  The right pleural effusion is also significantly decreased in size.  There are small residual pleural effusions and associated bibasilar opacities.  Unchanged cardiomegaly and atherosclerosis of the thoracic aorta.  Surgical clips project over the left apex.  IMPRESSION:  1.  Improving pulmonary edema and significantly decreased right pleural effusion following thoracentesis. 2.  Small bilateral effusions and associated bibasilar opacities persist.   Original Report Authenticated By: Sterling Big, M.D.    Dg  Chest Port 1 View  04/03/2012  *RADIOLOGY REPORT*  Clinical Data: Effusion  PORTABLE CHEST - 1 VIEW  Comparison: 04/02/2012  Findings: Cardiomegaly with suspected mild interstitial edema.  Moderate left pleural effusion, increased.  No pneumothorax.  IMPRESSION: Cardiomegaly with suspected mild interstitial edema.  Moderate left pleural effusion, increased.   Original Report Authenticated By: Charline Bills, M.D.    Dg Chest Port 1 View  04/02/2012  *RADIOLOGY REPORT*  Clinical Data: Hypoxia, recent thoracentesis  PORTABLE CHEST - 1 VIEW  Comparison: 04/01/2012  Findings: Patchy left lung opacities, favored to reflect asymmetric edema, infection not excluded.  Small bilateral pleural effusions, left greater than right. No pneumothorax is seen.  Stable cardiomegaly.  Old deformity of the left lateral clavicle.  Surgical clips overlying the left upper hemithorax.  IMPRESSION: Patchy left lung opacities, favored to reflect asymmetric edema, infection not excluded.  Cardiomegaly with small bilateral pleural effusions, left greater than right.   Original Report Authenticated By: Charline Bills, M.D.    BMET    Component Value Date/Time   NA 131* 04/02/2012 0445   K 4.3 04/02/2012 0445   CL 90* 04/02/2012 0445   CO2 33* 04/02/2012 0445   GLUCOSE 88 04/02/2012 0445   BUN 24* 04/02/2012 0445   CREATININE 1.33 04/02/2012 0445  CALCIUM 8.9 04/02/2012 0445   GFRNONAA 53* 04/02/2012 0445   GFRAA 61* 04/02/2012 0445   CBC    Component Value Date/Time   WBC 13.4* 03/28/2012 1253   RBC 3.77* 03/28/2012 1253   HGB 11.6* 03/28/2012 1253   HCT 33.4* 03/28/2012 1253   PLT 394 03/28/2012 1253   MCV 88.6 03/28/2012 1253   MCH 30.8 03/28/2012 1253   MCHC 34.7 03/28/2012 1253   RDW 13.3 03/28/2012 1253   LYMPHSABS 0.9 03/16/2012 0937   MONOABS 2.2* 03/16/2012 0937   EOSABS 0.0 03/16/2012 0937   BASOSABS 0.0 03/16/2012 0937     Assessment: 1. Hyponatremia, improving 2. STEMI 3. COPD 4. Volume overload,  improved  Plan: 1. Await labs 2. Will decrease demeclocycline 3. Recheck labs in AM Alberta Cairns T

## 2012-04-04 LAB — BASIC METABOLIC PANEL
BUN: 20 mg/dL (ref 6–23)
Calcium: 8.7 mg/dL (ref 8.4–10.5)
Creatinine, Ser: 1.27 mg/dL (ref 0.50–1.35)
GFR calc Af Amer: 64 mL/min — ABNORMAL LOW (ref >90)
GFR calc non Af Amer: 56 mL/min — ABNORMAL LOW (ref >90)

## 2012-04-04 MED ORDER — SPIRONOLACTONE 12.5 MG HALF TABLET
12.5000 mg | ORAL_TABLET | Freq: Two times a day (BID) | ORAL | Status: DC
  Administered 2012-04-04 – 2012-04-06 (×4): 12.5 mg via ORAL
  Filled 2012-04-04 (×7): qty 1

## 2012-04-04 MED ORDER — NICOTINE 14 MG/24HR TD PT24
14.0000 mg | MEDICATED_PATCH | Freq: Every day | TRANSDERMAL | Status: DC
  Administered 2012-04-04 – 2012-04-06 (×3): 14 mg via TRANSDERMAL
  Filled 2012-04-04 (×3): qty 1

## 2012-04-04 NOTE — Progress Notes (Signed)
At 23:45 Pt c/o Left chest pain, sharp 6 of 10. V/S stable. Placed back on O2 @ 2L/min., EKG done -no changes. Fentanyl 50 mcg IV given with relief. Charlton Haws NP informed.Will continue to monitor pt.

## 2012-04-04 NOTE — Progress Notes (Signed)
PA Student Daily Progress Note [facility] Subjective: Pt resting comfortably. Had episode of CP overnight with no EKG changes, relieved with fentanyl. This morning denies CP, SOB, dizziness, and nausea. No new complaints.  Objective:  Filed Vitals:   04/03/12 2118 04/03/12 2340 04/03/12 2343 04/04/12 0344  BP:   109/48 113/58  Pulse:   62 58  Temp: 98.2 F (36.8 C) 98.1 F (36.7 C)  98.4 F (36.9 C)  TempSrc: Oral Oral  Oral  Resp:      Height:      Weight:      SpO2:   91% 98%   Physical Exam: General appearance: Resting comfortably, NAD  Lungs: diminished breath sounds bilaterally, CTA bilaterally  Heart: regular rate and rhythm, S1, S2 normal, no murmur, click, rub or gallop  Abdomen: soft, non-tender; bowel sounds normal; no masses, no organomegaly  Extremities: extremities normal, atraumatic, no cyanosis or edema  Neuro: grossly intact  Assessment/Plan: [name] [name] is a 71 yo male with a PMH of HNT, COPD (current smoker-50 pack year history), CVA (2004), PVD, Hyperlipidemia, and femoral bi-pass in 2012.   1. Hyponatremia- review of previous records indicates a Na baseline of 128-134. Pts Na declined over admission from 131 to 121, but has been increasing andis now up to 129. Today is 129 up from 127 yesterday which is at the low end of his baseline. Demeclocycline decreased yesterday to once a day.  - Continue Lasix PO   - Fluid restrict PO to 1L per day   - Continue to monitor  2. STEMI with unsuccessful RCA PCI- primary team was unable to place stent despite multiple attempts.   - Medical management per primary team  3. COPD- pt current smoker with 50 pack year history who presented in acute resp failure and had to be intubated.   - Smoking cessation counseling   - pt will likely need home O2 given current needs during admission   - continue per primary team  4. Hypokalemia- resolved today to 4.3. Continue supplementation   This is a Psychologist, occupational  Note.  The care of the patient was discussed with Dr. Eliott Nine and the assessment and plan formulated with their assistance.  Please see their attached note for official documentation of the daily encounter.   I have seen and examined the patient, reviewed the chart, and reviewed the note above.  Patient with some degree of chronic hyponatremia (normal TSH and cortisol) with possible underlying reset osmostat +/- effects of trazadone and ARB on sodium handling, who developed a subacute drop in his sodium in the setting of CHF/resp issues.  His sodium is now in the lower end of the range that has been "normal" for him for some time, on water restriction, and a small dose of demeclocycline (which would be appropriate to continue long term) and a relatively small dose of furosemide (with colume issues now under better control).  Potassium is adequately repleted.  From a renal standpoint have little else to offer.  Would simply continue the current regimen (if he needs less lasix at some point for volume mgmt should be fine to decrease)  At this time sill sign off but please call if there are additional questions.  Amanee Iacovelli B      Additional Objective Information: Labs:   Lab 04/04/12 0538 04/03/12 1119 04/02/12 0445 03/29/12 0522  NA 129* 127* 131* --  K 4.0 4.6 4.3 --  CL 86* 86* 90* --  CO2 33* 36*  33* --  GLUCOSE 86 95 88 --  BUN 20 18 24* --  CREATININE 1.27 1.30 1.33 --  CALCIUM 8.7 8.8 8.9 --  ALB -- -- -- --  PHOS -- -- -- 4.0   Liver Function Tests:  Lab 03/30/12 1417 03/29/12 0522  AST -- --  ALT -- --  ALKPHOS -- --  BILITOT -- --  PROT 6.9 --  ALBUMIN -- 3.4*  CBC:  Blood Culture    Component Value Date/Time   SDES FLUID LEFT PLEURAL 03/30/2012 1330   SDES FLUID LEFT PLEURAL 03/30/2012 1330   SDES FLUID LEFT PLEURAL 03/30/2012 1330   SPECREQUEST Normal 03/30/2012 1330   SPECREQUEST Normal 03/30/2012 1330   SPECREQUEST Normal 03/30/2012 1330   CULT CULTURE WILL BE  EXAMINED FOR 6 WEEKS BEFORE ISSUING A FINAL REPORT 03/30/2012 1330   CULT NO GROWTH 3 DAYS 03/30/2012 1330   REPTSTATUS PENDING 03/30/2012 1330   REPTSTATUS 04/03/2012 FINAL 03/30/2012 1330   REPTSTATUS 03/31/2012 FINAL 03/30/2012 1330    Cardiac Enzymes:  Lab 04/02/12 1701 04/02/12 1210 04/02/12 0702 03/28/12 2157 03/28/12 1744 03/28/12 1158  CKTOTAL -- -- -- -- -- 140  CKMB -- -- -- -- -- 9.0*  CKMBINDEX -- -- -- -- -- --  TROPONINI <0.30 <0.30 <0.30 0.62* 0.46* --   CBG:  Lab 04/03/12 0755  GLUCAP 93   Micro Results: Recent Results (from the past 240 hour(s))  CULTURE, EXPECTORATED SPUTUM-ASSESSMENT     Status: Normal   Collection Time   03/27/12 11:17 AM      Component Value Range Status Comment   Specimen Description SPUTUM   Final    Special Requests NONE   Final    Sputum evaluation     Final    Value: THIS SPECIMEN IS ACCEPTABLE. RESPIRATORY CULTURE REPORT TO FOLLOW.   Report Status 03/27/2012 FINAL   Final   CULTURE, RESPIRATORY     Status: Normal   Collection Time   03/27/12 11:17 AM      Component Value Range Status Comment   Specimen Description SPUTUM   Final    Special Requests NONE   Final    Gram Stain     Final    Value: NO WBC SEEN     RARE SQUAMOUS EPITHELIAL CELLS PRESENT     RARE GRAM POSITIVE RODS     RARE GRAM POSITIVE COCCI     IN PAIRS RARE GRAM NEGATIVE RODS   Culture NORMAL OROPHARYNGEAL FLORA   Final    Report Status 03/29/2012 FINAL   Final   AFB CULTURE WITH SMEAR     Status: Normal (Preliminary result)   Collection Time   03/30/12  1:30 PM      Component Value Range Status Comment   Specimen Description FLUID LEFT PLEURAL   Final    Special Requests Normal   Final    ACID FAST SMEAR NO ACID FAST BACILLI SEEN   Final    Culture     Final    Value: CULTURE WILL BE EXAMINED FOR 6 WEEKS BEFORE ISSUING A FINAL REPORT   Report Status PENDING   Incomplete   BODY FLUID CULTURE     Status: Normal   Collection Time   03/30/12  1:30 PM       Component Value Range Status Comment   Specimen Description FLUID LEFT PLEURAL   Final    Special Requests Normal   Final    Gram Stain  Final    Value: NO WBC SEEN     NO ORGANISMS SEEN   Culture NO GROWTH 3 DAYS   Final    Report Status 04/03/2012 FINAL   Final   FUNGAL STAIN     Status: Normal   Collection Time   03/30/12  1:30 PM      Component Value Range Status Comment   Specimen Description FLUID LEFT PLEURAL   Final    Special Requests Normal   Final    Fungal Smear NO YEAST OR FUNGAL ELEMENTS SEEN   Final    Report Status 03/31/2012 FINAL   Final    Studies/Results: Dg Chest Port 1 View  04/03/2012  *RADIOLOGY REPORT*  Clinical Data: Effusion  PORTABLE CHEST - 1 VIEW  Comparison: 04/02/2012  Findings: Cardiomegaly with suspected mild interstitial edema.  Moderate left pleural effusion, increased.  No pneumothorax.  IMPRESSION: Cardiomegaly with suspected mild interstitial edema.  Moderate left pleural effusion, increased.   Original Report Authenticated By: Charline Bills, M.D.    Medications: Scheduled Meds:   . antiseptic oral rinse  15 mL Mouth Rinse BID  . aspirin  81 mg Oral Daily  . atorvastatin  20 mg Oral q1800  . clopidogrel  75 mg Oral Q breakfast  . demeclocycline  300 mg Oral q morning - 10a  . enoxaparin (LOVENOX) injection  40 mg Subcutaneous Q24H  . furosemide  40 mg Oral Daily  . gabapentin  600 mg Oral BID  . isosorbide mononitrate  30 mg Oral Daily  . lidocaine  1 patch Transdermal Q24H  . metoprolol succinate  12.5 mg Oral Daily  . nicotine  21 mg Transdermal Daily  . pantoprazole  40 mg Oral Q1200  . potassium chloride  20 mEq Oral Daily  . spironolactone  12.5 mg Oral Daily  . tiotropium  18 mcg Inhalation Daily  . DISCONTD: demeclocycline  300 mg Oral BID  . DISCONTD: furosemide  40 mg Intravenous Once  . DISCONTD: metoprolol tartrate  12.5 mg Oral BID   Continuous Infusions:  PRN Meds:.sodium chloride, acetaminophen, albuterol,  fentaNYL, guaiFENesin-dextromethorphan, levalbuterol, nitroGLYCERIN, ondansetron (ZOFRAN) IV, traMADol    V. [place], [place], PA-S2 04/04/2012, 8:18 AM

## 2012-04-04 NOTE — Progress Notes (Signed)
Pt had an 18 beat run of slow vtach.  Huey Bienenstock, PAC was notified and states it is ok to move pt to tele. Pt was asymptomatic with the arrhythmia. Report was called to RN on unit 4700. Pt will move to room 4732

## 2012-04-04 NOTE — Progress Notes (Signed)
[facility]  Subjective: He complained of right-sided CP last night which has resolved.  Given fentanyl.  No SOB  Objective: Vital signs in last 24 hours: Temp:  [98.1 F (36.7 C)-98.9 F (37.2 C)] 98.3 F (36.8 C) (10/14 0800) Pulse Rate:  [58-65] 58  (10/14 0344) BP: (109-142)/(48-66) 113/58 mmHg (10/14 0344) SpO2:  [91 %-100 %] 98 % (10/14 0344) Last BM Date: 04/02/12  Intake/Output from previous day: 10/13 0701 - 10/14 0700 In: 780 [P.O.:780] Out: 1150 [Urine:1150] Intake/Output this shift: Total I/O In: -  Out: 375 [Urine:375]  Medications Current Facility-Administered Medications  Medication Dose Route Frequency Provider Last Rate Last Dose  . 0.9 %  sodium chloride infusion  250 mL Intravenous PRN Jeanella Craze, NP 10 mL/hr at 03/19/12 2305 250 mL at 03/19/12 2305  . acetaminophen (TYLENOL) tablet 650 mg  650 mg Oral Q4H PRN Corky Crafts, MD   650 mg at 04/02/12 2145  . albuterol (PROVENTIL) (5 MG/ML) 0.5% nebulizer solution 2.5 mg  2.5 mg Nebulization Q4H PRN Leslye Peer, MD   2.5 mg at 03/28/12 1003  . antiseptic oral rinse (BIOTENE) solution 15 mL  15 mL Mouth Rinse BID Runell Gess, MD   15 mL at 04/03/12 2200  . aspirin chewable tablet 81 mg  81 mg Oral Daily Corky Crafts, MD   81 mg at 04/03/12 1057  . atorvastatin (LIPITOR) tablet 20 mg  20 mg Oral q1800 Runell Gess, MD   20 mg at 04/03/12 1749  . clopidogrel (PLAVIX) tablet 75 mg  75 mg Oral Q breakfast Corky Crafts, MD   75 mg at 04/03/12 4098  . demeclocycline (DECLOMYCIN) tablet 300 mg  300 mg Oral q morning - 10a Dyke Maes, MD   300 mg at 04/03/12 1058  . enoxaparin (LOVENOX) injection 40 mg  40 mg Subcutaneous Q24H Marty Heck, PHARMD   40 mg at 04/03/12 1054  . fentaNYL (SUBLIMAZE) injection 50 mcg  50 mcg Intravenous Q3H PRN Coralyn Helling, MD   50 mcg at 04/03/12 2358  . furosemide (LASIX) tablet 40 mg  40 mg Oral Daily Eda Paschal  [place], [place]   40 mg at 04/03/12 1057  . gabapentin (NEURONTIN) tablet 600 mg  600 mg Oral BID Corky Crafts, MD   600 mg at 04/03/12 2128  . guaiFENesin-dextromethorphan (ROBITUSSIN DM) 100-10 MG/5ML syrup 5 mL  5 mL Oral Q4H PRN Runell Gess, MD   5 mL at 04/01/12 2125  . isosorbide mononitrate (IMDUR) 24 hr tablet 30 mg  30 mg Oral Daily Eda Paschal [place], [place]   30 mg at 04/03/12 1057  . levalbuterol (XOPENEX) nebulizer solution 0.63 mg  0.63 mg Nebulization Q6H PRN Nada Boozer, NP   0.63 mg at 03/29/12 0630  . lidocaine (LIDODERM) 5 % 1 patch  1 patch Transdermal Q24H Marykay Lex, MD   1 patch at 04/03/12 1100  . metoprolol succinate (TOPROL-XL) 24 hr tablet 12.5 mg  12.5 mg Oral Daily Mihai Croitoru, MD   12.5 mg at 04/03/12 1103  . nicotine (NICODERM CQ - dosed in mg/24 hours) patch 21 mg  21 mg Transdermal Daily Runell Gess, MD   21 mg at 04/03/12 1059  . nitroGLYCERIN (NITROSTAT) SL tablet 0.4 mg  0.4 mg Sublingual Q5 Min x 3 PRN Jeanella Craze, NP      . ondansetron (ZOFRAN) injection 4 mg  4 mg  Intravenous Q6H PRN Corky Crafts, MD      . pantoprazole (PROTONIX) EC tablet 40 mg  40 mg Oral Q1200 Coralyn Helling, MD   40 mg at 04/03/12 1103  . potassium chloride SA (K-DUR,KLOR-CON) CR tablet 20 mEq  20 mEq Oral Daily Eda Paschal [place], [place]   20 mEq at 04/03/12 1057  . spironolactone (ALDACTONE) tablet 12.5 mg  12.5 mg Oral Daily Lennette Bihari, MD   12.5 mg at 04/03/12 1058  . tiotropium (SPIRIVA) inhalation capsule 18 mcg  18 mcg Inhalation Daily Nada Boozer, NP   18 mcg at 04/03/12 0813  . traMADol (ULTRAM) tablet 50 mg  50 mg Oral Q6H PRN Abelino Derrick, PA   50 mg at 04/03/12 2231  . DISCONTD: furosemide (LASIX) injection 40 mg  40 mg Intravenous Once Mihai Croitoru, MD      . DISCONTD: metoprolol tartrate (LOPRESSOR) tablet 12.5 mg  12.5 mg Oral BID Abelino Derrick, PA   12.5 mg at 04/02/12 0911    PE: General appearance: alert, cooperative and no distress Lungs: clear to  auscultation bilaterally and BS decreased. No wheezing. Heart: regular rate and rhythm, S1, S2 normal, no murmur, click, rub or gallop Extremities: No LEE Pulses: 2+ and symmetric LEft DP 1+.  right DP 0.  PTs: 0 Skin: Warm and dry Neurologic: Grossly normal  Lab Results:  No results found for this basename: WBC:3,HGB:3,HCT:3,PLT:3 in the last 72 hours BMET  [facility] 04/04/12 0538 04/03/12 1119 04/02/12 0445  NA 129* 127* 131*  K 4.0 4.6 4.3  CL 86* 86* 90*  CO2 33* 36* 33*  GLUCOSE 86 95 88  BUN 20 18 24*  CREATININE 1.27 1.30 1.33  CALCIUM 8.7 8.8 8.9   BNP (last 3 results)  Basename 03/30/12 0420 03/28/12 0625 03/26/12 0500  PROBNP 9610.0* 5502.0* 5868.0*   Studies/Results: 10/13:  PORTABLE CHEST - 1 VIEW  Comparison: 04/02/2012  Findings: Cardiomegaly with suspected mild interstitial edema.  Moderate left pleural effusion, increased.  No pneumothorax.  IMPRESSION:  Cardiomegaly with suspected mild interstitial edema.  Moderate left pleural effusion, increased.  Assessment/Plan  Principal Problem:  *STEMI, unsuccessful RCA PCI 03/17/12 Active Problems:  Tobacco abuse  Acute respiratory failure, on admit and recurrent 03/28/12  Acute pulmonary edema  Hyponatremia, acute on chronic. ? secondary to volume vs SIADH vs drug related  COPD with respiratory failure, acute  PVD, Lt Ax-fem and fem-fem [facility] 2/12, Dr Manson Passey follows  HTN (hypertension)  Dyslipidemia  History of CVA (cerebrovascular accident)  PAF, post MI,  on Amiodarone  NSVT, post MI  Ischemic cardiomyopathy, EF 30-35% by echo 03/29/12  Pericardial effusion, moderate on echo 03/29/12  Diastolic dysfunction, grade 2  Pleural effusion, tapped 1L 03/30/12  Plan:  S/P DES to distal RCA and one to the proximal RCA due to dissection resulting in TIMI 1 flow.  S/P left pleural thoracentesis 10/9 yielding 1 Liter of clear fluid.   HR in the low 50's.  BP stable.  Slight improvement in Na to 129.  0.37L diuresed  in the last 24 hours.  EF 35-40%, grade 2 diastolic dysfunction.   ASA, lipitor, Plavix, LAsix 40 QD, Imdur 30, Toprol 12.5, Aldactone 12.5 QD, K+.  Moderate Left pleural effusion increase from prior CXR.  CP right sided and resolved.  No different EKG changes.     LOS: 19 days    HAGER, BRYAN 04/04/2012 8:30 AM    Patient seen and examined. Agree with assessment  and plan. Breathing better. Ocassional sharp pleuritic transient chest pain. Will D?C supplemental K and increase spironolactone to 12.5 mg bid. F/U CXR tomorrow and BNP. Transfer to telemtery.   Lennette Bihari, MD, [facility] 04/04/2012 9:15 AM

## 2012-04-04 NOTE — Progress Notes (Signed)
Patient arrived to room via wheelchair from 2900. Patient place on monitor and oriented to room. Call bell within reach.

## 2012-04-04 NOTE — Progress Notes (Signed)
CARDIAC REHAB PHASE I   PRE:  Rate/Rhythm: 56SB  BP:  Supine:   Sitting: 123/59  Standing:    SaO2: 99%2L  MODE:  Ambulation: 1050 ft   POST:  Rate/Rhythem: 71SR  BP:  Supine:   Sitting: 127/59  Standing:    SaO2: tried RA during walk and down to 77% ? True reading. Pt denied SOB and did not appear SOB. Put on 2L for 2 more rounds. Could not get sats to register until resting in room at 100% 2L  1100-1140 Pt tolerated walks well. Walked 1050 ft . Tried RA but sats dropped. Pt stated no difference in his breathing on oxygen as off. Could not get sats to register well on 2L until resting in room. Pt has steady gait. To recliner. Left on 2L.  Duanne Limerick

## 2012-04-05 ENCOUNTER — Inpatient Hospital Stay (HOSPITAL_COMMUNITY): Payer: Medicare Other

## 2012-04-05 LAB — BASIC METABOLIC PANEL
Calcium: 8.8 mg/dL (ref 8.4–10.5)
Creatinine, Ser: 1.23 mg/dL (ref 0.50–1.35)
GFR calc Af Amer: 67 mL/min — ABNORMAL LOW (ref >90)
GFR calc non Af Amer: 58 mL/min — ABNORMAL LOW (ref >90)
Sodium: 127 mEq/L — ABNORMAL LOW (ref 135–145)

## 2012-04-05 LAB — PRO B NATRIURETIC PEPTIDE: Pro B Natriuretic peptide (BNP): 9640 pg/mL — ABNORMAL HIGH (ref 0–125)

## 2012-04-05 NOTE — Progress Notes (Signed)
Chart review complete.  Patient is not eligible for [facility] Care Management services because her PCP is [facility] affiliated.  For any additional questions or new referrals please contact Anibal Henderson BSN RN [facility] Liaison at [phone]

## 2012-04-05 NOTE — Progress Notes (Signed)
CARDIAC REHAB PHASE I   PRE:  Rate/Rhythm: 52SB  BP:  Supine:   Sitting: 90/60  Standing:    SaO2: 90-91%RA  MODE:  Ambulation: 920 ft   POST:  Rate/Rhythem: 72  BP:  Supine:   Sitting: 120/70  Standing:    SaO2: got to 86%RA at 300 ft . Put on 2l to keep sats up  SATURATION QUALIFICATIONS:  Patient Saturations on Room Air at Rest = 90-91%  Patient Saturations on Room Air while Ambulating = 86%  Patient Saturations on 2 Liters of oxygen while Ambulating = 90-96%  Statement of medical necessity for home oxygen: desat when walking   1442-1502  Pt walked 920 ft with steady gait. Denied CP or SOB. Started out on RA but sats dropped at about 300 ft. Put on 2L to keep sats above 90%. Encouraged pt to wear oxygen at 2 L  When walking. Back to recliner after walk. Left to RA at pt's request. Pt states he feels more comfortable knowing he has oxygen available at home.  Duanne Limerick

## 2012-04-05 NOTE — Progress Notes (Signed)
Pt ambulated hallway for five minutes on room air.  Pt's o2 sats dropped to 87% on room air.  At rest pt's o2 sats are 92% on room air.  Pt on room air currently will continue to monitor.

## 2012-04-05 NOTE — Discharge Summary (Signed)
Patient ID: ROXY FILLER,  MRN: [id number], DOB/AGE: 71/06/1940 71 y.o.  Admit date: 03/16/2012 Discharge date: 04/06/2012  Primary Care Provider:  Primary Cardiologist: Dr Allyson Sabal  Discharge Diagnoses Principal Problem:  *STEMI, unsuccessful RCA PCI 03/17/12 Active Problems:  Tobacco abuse  COPD (chronic obstructive pulmonary disease)  Acute respiratory failure, on admit and recurrent 03/28/12  Acute pulmonary edema  Altered mental status  Hyponatremia, acute on chronic. ? secondary to volume vs SIADH vs drug related  COPD with respiratory failure, acute  PVD, Lt Ax-fem and fem-fem BPG 2/12, Dr Manson Passey follows  HTN (hypertension)  Dyslipidemia  History of CVA (cerebrovascular accident)  PAF, post MI,  on Amiodarone  NSVT, post MI, bradycardia at baseline limiting beta blocker  Ischemic cardiomyopathy, EF 30-35% by echo 03/29/12  Other encephalopathy, Acute, secondary to hypotension and resp. failure on admit now resolved at discharge  At risk for sudden cardiac death, due to ICM, d/c'd with lifevest  Hypoxia - with ambulation, SaO2 to 87% on RA.; not previously on Home O2. COPD + ICM  Hyposmolality and/or hyponatremia  Pericardial effusion, moderate on echo 03/29/12  Diastolic dysfunction, grade 2  Pleural effusion, tapped 1L 03/30/12, small pl effusion at d/c    Procedures: Urgent cath, attempted PCI 03/17/12 03/16/2012: Intubation by Dr. Welton Flakes Course:  Mr. Garrelts is a 71 yo man with PMH of HTN, dyslipidemia, prior CVA, continued tobacco use, known peripheral vascular disease s/p 2/12 axillobifemoral bypass who was admitted 03/17/12 with presumed COPD exacerbation after recent diagnosis with symptoms of shortness of breath, hypoxia and hypoxemia. He had been hemodynamically stable with initial troponins negative but these turned positive troponin  21 and his  EKG showed inferior ST elevations. STEMI MD was notified.  Mr. Beagle was intubated but could partially participate on  exam with nodding appropriately. It was decided urgent catheterization was warranted. Mr. Zayas daughter was notified and confirmed temporary reversal of partial DNR for the procedure and until stabilization. Cath by Dr Eldridge Dace revealed subtotally, occluded distal right coronary artery. The entire vessel was heavily calcified. This vessel was likely closed temporarily causing the transient ST elevation noted on prior ECG and at the very beginning of the case. Drug-eluting stent, 2.25 x 23 expedition stent deployed in the distal RCA. Due to catheter-induced proximal RCA dissection, a 3.0 x 23 expedition stent was deployed in the proximal vessel. Balloon angioplasty to the entire RCA post dissection. This only resulted in TIMI 1 flow. There were faint left to right collaterals at the beginning of the case. Post OP he PAF as well as NSVT and was Rx's with IV Amiodarone. Marland Kitchen His EF was 30-35%. Lifevest was arranged.  He was extubated and close to discharge but he remained on O2. We felt he had superimposed CHF as well as sever COPD with exacerbation. By the 5th of October his Na+ had dropped to 123. By the 7th he had developed worsening/acute (with transfer back to stepdown), respiratory failure. He was seen in consult by the Nephrology group and diuresis, fluid restriction and Demeclocycline was started. Repeat echo showed his EF to be 35-40% on 03/29/12. He ultimately required a Lt thoracentesis on 03/30/12- 1L removed. His sodium improved gradually. Dr Rennis Golden feels he can be discharged 04/06/12.    He will have home O2, nebs and Lifevest.  Insurance papers filled out, may need to be out of work permanently.    Discharge Vitals:  Blood pressure 102/50, pulse 56, temperature 97.9 F (36.6 C), temperature  source Oral, resp. rate 18, height 5\' 8"  (1.727 m), weight 56.2 kg (123 lb 14.4 oz), SpO2 91.00%.    Labs: Results for orders placed during the hospital encounter of 03/16/12 (from the past 48 hour(s))    BASIC METABOLIC PANEL     Status: Abnormal   Collection Time   04/05/12  6:36 AM      Component Value Range Comment   Sodium 127 (*) 135 - 145 mEq/L    Potassium 3.7  3.5 - 5.1 mEq/L    Chloride 87 (*) 96 - 112 mEq/L    CO2 33 (*) 19 - 32 mEq/L    Glucose, Bld 86  70 - 99 mg/dL    BUN 20  6 - 23 mg/dL    Creatinine, Ser 4.78  0.50 - 1.35 mg/dL    Calcium 8.8  8.4 - 29.5 mg/dL    GFR calc non Af Amer 58 (*) >90 mL/min    GFR calc Af Amer 67 (*) >90 mL/min   PRO B NATRIURETIC PEPTIDE     Status: Abnormal   Collection Time   04/05/12  6:36 AM      Component Value Range Comment   Pro B Natriuretic peptide (BNP) 9640.0 (*) 0 - 125 pg/mL   BASIC METABOLIC PANEL     Status: Abnormal   Collection Time   04/06/12  5:30 AM      Component Value Range Comment   Sodium 128 (*) 135 - 145 mEq/L    Potassium 3.8  3.5 - 5.1 mEq/L    Chloride 89 (*) 96 - 112 mEq/L    CO2 32  19 - 32 mEq/L    Glucose, Bld 85  70 - 99 mg/dL    BUN 23  6 - 23 mg/dL    Creatinine, Ser 6.21  0.50 - 1.35 mg/dL    Calcium 8.9  8.4 - 30.8 mg/dL    GFR calc non Af Amer 52 (*) >90 mL/min    GFR calc Af Amer 60 (*) >90 mL/min     Disposition:      Follow-up Information    Follow up with Runell Gess, MD. On 04/11/2012. (2:45 pm)    Contact information:   [address] [place] [address] [phone]       Follow up with Leslye Peer., MD. On 04/11/2012. (at 3:00pM)    Contact information:   [address] [facility] [place] [address] [phone]          Discharge Medications:    Medication List     As of 04/06/2012  3:51 PM    STOP taking these medications         amLODipine 10 MG tablet   Commonly known as: NORVASC      hydrALAZINE 25 MG tablet   Commonly known as: APRESOLINE      losartan 50 MG tablet   Commonly known as: COZAAR      nicotine 21 mg/24hr patch   Commonly known as: NICODERM CQ - dosed in mg/24 hours      potassium  phosphate (monobasic) 500 MG tablet   Commonly known as: K-PHOS ORIGINAL      pravastatin 40 MG tablet   Commonly known as: PRAVACHOL      traZODone 150 MG tablet   Commonly known as: DESYREL      TAKE these medications         acetaminophen 325 MG tablet   Commonly  known as: TYLENOL   Take 2 tablets (650 mg total) by mouth every 4 (four) hours as needed.      albuterol (5 MG/ML) 0.5% nebulizer solution   Commonly known as: PROVENTIL   Take 0.5 mLs (2.5 mg total) by nebulization every 4 (four) hours as needed for wheezing or shortness of breath.      aspirin 81 MG chewable tablet   Chew 1 tablet (81 mg total) by mouth daily.      atorvastatin 20 MG tablet   Commonly known as: LIPITOR   Take 2 tablets (40 mg total) by mouth daily at 6 PM.      clopidogrel 75 MG tablet   Commonly known as: PLAVIX   Take 75 mg by mouth daily.      demeclocycline 150 MG tablet   Commonly known as: DECLOMYCIN   Take 2 tablets (300 mg total) by mouth every morning.      furosemide 40 MG tablet   Commonly known as: LASIX   Take 1 tablet (40 mg total) by mouth daily.      gabapentin 600 MG tablet   Commonly known as: NEURONTIN   Take 600 mg by mouth 2 (two) times daily.      guaiFENesin-dextromethorphan 100-10 MG/5ML syrup   Commonly known as: ROBITUSSIN DM   Take 5 mLs by mouth every 4 (four) hours as needed for cough.      isosorbide mononitrate 30 MG 24 hr tablet   Commonly known as: IMDUR   Take 1 tablet (30 mg total) by mouth daily.      lidocaine 5 %   Commonly known as: LIDODERM   Place 1 patch onto the skin daily. Remove & Discard patch within 12 hours or as directed by MD      metoprolol succinate 25 MG 24 hr tablet   Commonly known as: TOPROL-XL   Take 0.5 tablets (12.5 mg total) by mouth daily.      nicotine 14 mg/24hr patch   Commonly known as: NICODERM CQ - dosed in mg/24 hours   Place 1 patch onto the skin daily.      nitroGLYCERIN 0.4 MG SL tablet   Commonly  known as: NITROSTAT   Place 1 tablet (0.4 mg total) under the tongue every 5 (five) minutes x 3 doses as needed for chest pain.      oxyCODONE-acetaminophen 10-650 MG per tablet   Commonly known as: PERCOCET   Take 1 tablet by mouth every 4 (four) hours as needed. For pain      pantoprazole 40 MG tablet   Commonly known as: PROTONIX   Take 1 tablet (40 mg total) by mouth daily at 12 noon.      risedronate 35 MG tablet   Commonly known as: ACTONEL   Take 35 mg by mouth every 7 (seven) days. Take on Wednesdays.  Take with water on empty stomach, nothing by mouth or lie down for next 30 minutes.      spironolactone 25 MG tablet   Commonly known as: ALDACTONE   Take 0.5 tablets (12.5 mg total) by mouth 2 (two) times daily.      tiotropium 18 MCG inhalation capsule   Commonly known as: SPIRIVA   Place 1 capsule (18 mcg total) into inhaler and inhale daily.      traMADol 50 MG tablet   Commonly known as: ULTRAM   Take 1 tablet (50 mg total) by mouth every 6 (six) hours as needed.  Outstanding Labs/Studies Na 128, K+ 3.8, CL 89, CO2 32, BUN 23, Cr. 1.34, Ca+ 8.9, Pro BNP  9640, LDH 407,  CBC  Hgb11.6, HCT  33.4, WBC 13.4, PLTS 394  CT angio of chest prior to thoracentesis: IMPRESSION:  No evidence of pulmonary embolism. No evidence of aortic  dissection.  Mild interstitial edema. Moderate left and small to moderate right  pleural effusions.  Cardiomegaly. Small pericardial effusion.  Mildly enlarged mediastinal/left supraclavicular lymph nodes, as  described above  2D ECHO  03/18/12 Study Conclusions  - Left ventricle: The cavity size was normal. There was mild concentric hypertrophy. Systolic function was moderately to severely reduced. The estimated ejection fraction was in the range of 30% to 35%. Moderate hypokinesis of the inferolateral, inferior, and inferoseptal myocardium. Doppler parameters are consistent with abnormal left ventricular relaxation  (grade 1 diastolic dysfunction). - Aortic valve: Trivial regurgitation. - Right ventricle: The cavity size was mildly dilated. Wall thickness was normal. Systolic function was mildly reduced. - Right atrium: The atrium was mildly dilated. - Atrial septum: No defect or patent foramen ovale was Identified  2D ECHO 03/29/12: Left ventricle: The cavity size was mildly dilated. Wall thickness was normal. Systolic function was moderately reduced. The estimated ejection fraction was in the range of 35% to 40%. Akinesis of the inferolateral and inferior myocardium. Features are consistent with a pseudonormal left ventricular filling pattern, with concomitant abnormal relaxation and increased filling pressure (grade 2 diastolic dysfunction). Doppler parameters are consistent with elevated mean left atrial filling pressure. - Aortic valve: Mild regurgitation. Mitral valve: Mild regurgitation.  - Left atrium: The atrium was mildly dilated. - Right ventricle: The cavity size was mildly dilated. Wall thickness was normal. Systolic function was mildly reduced. - Pulmonary arteries: PA peak pressure: 46mm Hg (S). - Pericardium, extracardiac: A small to moderate, free-flowing pericardial effusion was identified posterior to the heart. The fluid had no internal echoes.There was no evidence of hemodynamic compromise. There was a left pleural effusion. Impressions:  - Compared to the previous echo, the pericardial effusion is larger.  2 View CXR 04/05/12; CHEST - 2 VIEW  Comparison: 04/03/2012  Findings: Small left pleural effusion, decreased.  Chronic interstitial markings/emphysematous changes. No frank  interstitial edema.  The heart is normal in size.  Near complete mid thoracic compression deformity, unchanged.  IMPRESSION:  Small left pleural effusion, decreased.   Discharge Instructions:  call if weight goes up by 3 pounds in a day or 5 pounds in a week.  Watch diet  Wear life  vest  Use oxygen at 3 l. At night and when ambulating  Use nebulizers 4 times a day as needed for shortness of breath  Duration of Discharge Encounter: Greater than 30 minutes including physician time.  Jolene Provost PA-C 04/06/2012 3:51 PM

## 2012-04-05 NOTE — Progress Notes (Signed)
[facility]  DAILY PROGRESS NOTE   Subjective:  No events noted overnight. Sodium remains low. BNP has not significantly changed.  CXR today shows a decrease in small left pleural effusion. His breathing is subjectively better but still requiring oxygen.  Objective:  Temp:  [97.4 F (36.3 C)-98.1 F (36.7 C)] 97.9 F (36.6 C) (10/15 0456) Pulse Rate:  [53-62] 54  (10/15 0811) Resp:  [18-20] 18  (10/15 0811) BP: (108-127)/(55-66) 120/55 mmHg (10/15 0456) SpO2:  [95 %-99 %] 95 % (10/15 0811) Weight:  [56.8 kg (125 lb 3.5 oz)-57.788 kg (127 lb 6.4 oz)] 56.8 kg (125 lb 3.5 oz) (10/15 0456) Weight change:   Intake/Output from previous day: 10/14 0701 - 10/15 0700 In: 900 [P.O.:900] Out: 1250 [Urine:1250]  Intake/Output from this shift: Total I/O In: 360 [P.O.:360] Out: -   Medications: Current Facility-Administered Medications  Medication Dose Route Frequency Provider Last Rate Last Dose  . 0.9 %  sodium chloride infusion  250 mL Intravenous PRN Jeanella Craze, NP 10 mL/hr at 03/19/12 2305 250 mL at 03/19/12 2305  . acetaminophen (TYLENOL) tablet 650 mg  650 mg Oral Q4H PRN Corky Crafts, MD   650 mg at 04/02/12 2145  . albuterol (PROVENTIL) (5 MG/ML) 0.5% nebulizer solution 2.5 mg  2.5 mg Nebulization Q4H PRN Leslye Peer, MD   2.5 mg at 03/28/12 1003  . antiseptic oral rinse (BIOTENE) solution 15 mL  15 mL Mouth Rinse BID Runell Gess, MD   15 mL at 04/04/12 2200  . aspirin chewable tablet 81 mg  81 mg Oral Daily Corky Crafts, MD   81 mg at 04/04/12 1050  . atorvastatin (LIPITOR) tablet 20 mg  20 mg Oral q1800 Runell Gess, MD   20 mg at 04/04/12 1911  . clopidogrel (PLAVIX) tablet 75 mg  75 mg Oral Q breakfast Corky Crafts, MD   75 mg at 04/05/12 0603  . demeclocycline (DECLOMYCIN) tablet 300 mg  300 mg Oral q morning - 10a Dyke Maes, MD   300 mg at 04/04/12 1050  . enoxaparin (LOVENOX) injection 40 mg  40  mg Subcutaneous Q24H Marty Heck, PHARMD   40 mg at 04/04/12 0900  . fentaNYL (SUBLIMAZE) injection 50 mcg  50 mcg Intravenous Q3H PRN Coralyn Helling, MD   50 mcg at 04/04/12 2149  . furosemide (LASIX) tablet 40 mg  40 mg Oral Daily Abelino Derrick, PA   40 mg at 04/04/12 1050  . gabapentin (NEURONTIN) tablet 600 mg  600 mg Oral BID Corky Crafts, MD   600 mg at 04/04/12 2149  . guaiFENesin-dextromethorphan (ROBITUSSIN DM) 100-10 MG/5ML syrup 5 mL  5 mL Oral Q4H PRN Runell Gess, MD   5 mL at 04/01/12 2125  . isosorbide mononitrate (IMDUR) 24 hr tablet 30 mg  30 mg Oral Daily Eda Paschal [place], [place]   30 mg at 04/04/12 1050  . levalbuterol (XOPENEX) nebulizer solution 0.63 mg  0.63 mg Nebulization Q6H PRN Nada Boozer, NP   0.63 mg at 03/29/12 0630  . lidocaine (LIDODERM) 5 % 1 patch  1 patch Transdermal Q24H Marykay Lex, MD   1 patch at 04/04/12 1051  . metoprolol succinate (TOPROL-XL) 24 hr tablet 12.5 mg  12.5 mg Oral Daily Mihai Croitoru, MD   12.5 mg at 04/04/12 1057  . nicotine (NICODERM CQ - dosed in mg/24 hours) patch 14 mg  14 mg Transdermal Daily  Lennette Bihari, MD   14 mg at 04/04/12 1057  . nitroGLYCERIN (NITROSTAT) SL tablet 0.4 mg  0.4 mg Sublingual Q5 Min x 3 PRN Jeanella Craze, NP      . ondansetron (ZOFRAN) injection 4 mg  4 mg Intravenous Q6H PRN Corky Crafts, MD      . pantoprazole (PROTONIX) EC tablet 40 mg  40 mg Oral Q1200 Coralyn Helling, MD   40 mg at 04/04/12 1050  . spironolactone (ALDACTONE) tablet 12.5 mg  12.5 mg Oral BID Lennette Bihari, MD   12.5 mg at 04/05/12 0814  . tiotropium (SPIRIVA) inhalation capsule 18 mcg  18 mcg Inhalation Daily Nada Boozer, NP   18 mcg at 04/05/12 0809  . traMADol (ULTRAM) tablet 50 mg  50 mg Oral Q6H PRN Abelino Derrick, PA   50 mg at 04/03/12 2231    Physical Exam: General appearance: alert and no distress Neck: no adenopathy, no carotid bruit, no JVD, supple, symmetrical, trachea midline and thyroid not enlarged, symmetric, no  tenderness/mass/nodules Lungs: clear to auscultation bilaterally Heart: regular rate and rhythm, S1, S2 normal, no murmur, click, rub or gallop Abdomen: soft, non-tender; bowel sounds normal; no masses,  no organomegaly Extremities: extremities normal, atraumatic, no cyanosis or edema Pulses: 2+ and symmetric Skin: Skin color, texture, turgor normal. No rashes or lesions Neurologic: Grossly normal  Lab Results: Results for orders placed during the hospital encounter of 03/16/12 (from the past 48 hour(s))  BASIC METABOLIC PANEL     Status: Abnormal   Collection Time   04/03/12 11:19 AM      Component Value Range Comment   Sodium 127 (*) 135 - 145 mEq/L    Potassium 4.6  3.5 - 5.1 mEq/L    Chloride 86 (*) 96 - 112 mEq/L    CO2 36 (*) 19 - 32 mEq/L    Glucose, Bld 95  70 - 99 mg/dL    BUN 18  6 - 23 mg/dL    Creatinine, Ser 4.09  0.50 - 1.35 mg/dL    Calcium 8.8  8.4 - 81.1 mg/dL    GFR calc non Af Amer 54 (*) >90 mL/min    GFR calc Af Amer 63 (*) >90 mL/min   BASIC METABOLIC PANEL     Status: Abnormal   Collection Time   04/04/12  5:38 AM      Component Value Range Comment   Sodium 129 (*) 135 - 145 mEq/L    Potassium 4.0  3.5 - 5.1 mEq/L    Chloride 86 (*) 96 - 112 mEq/L    CO2 33 (*) 19 - 32 mEq/L    Glucose, Bld 86  70 - 99 mg/dL    BUN 20  6 - 23 mg/dL    Creatinine, Ser 9.14  0.50 - 1.35 mg/dL    Calcium 8.7  8.4 - 78.2 mg/dL    GFR calc non Af Amer 56 (*) >90 mL/min    GFR calc Af Amer 64 (*) >90 mL/min   BASIC METABOLIC PANEL     Status: Abnormal   Collection Time   04/05/12  6:36 AM      Component Value Range Comment   Sodium 127 (*) 135 - 145 mEq/L    Potassium 3.7  3.5 - 5.1 mEq/L    Chloride 87 (*) 96 - 112 mEq/L    CO2 33 (*) 19 - 32 mEq/L    Glucose, Bld 86  70 - 99 mg/dL  BUN 20  6 - 23 mg/dL    Creatinine, Ser 1.61  0.50 - 1.35 mg/dL    Calcium 8.8  8.4 - 09.6 mg/dL    GFR calc non Af Amer 58 (*) >90 mL/min    GFR calc Af Amer 67 (*) >90 mL/min     PRO B NATRIURETIC PEPTIDE     Status: Abnormal   Collection Time   04/05/12  6:36 AM      Component Value Range Comment   Pro B Natriuretic peptide (BNP) 9640.0 (*) 0 - 125 pg/mL     Imaging: Dg Chest 2 View  04/05/2012  *RADIOLOGY REPORT*  Clinical Data: Follow up effusion  CHEST - 2 VIEW  Comparison: 04/03/2012  Findings: Small left pleural effusion, decreased.  Chronic interstitial markings/emphysematous changes.  No frank interstitial edema.  The heart is normal in size.  Near complete mid thoracic compression deformity, unchanged.  IMPRESSION: Small left pleural effusion, decreased.   Original Report Authenticated By: Charline Bills, M.D.     Assessment:  1. Principal Problem: 2.  *STEMI, unsuccessful RCA PCI 03/17/12 3. Active Problems: 4.  Tobacco abuse 5.  Acute respiratory failure, on admit and recurrent 03/28/12 6.  Acute pulmonary edema 7.  Hyponatremia, acute on chronic. ? 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  COPD with respiratory failure, acute 9.  PVD, Lt Ax-fem and fem-fem BPG 2/12, Dr Manson Passey follows 10.  HTN (hypertension) 11.  Dyslipidemia 12.  History of CVA (cerebrovascular accident) 97.  PAF, post MI,  on Amiodarone 14.  NSVT, post MI 15.  Ischemic cardiomyopathy, EF 30-35% by echo 03/29/12 16.  Pericardial effusion, moderate on echo 03/29/12 17.  Diastolic dysfunction, grade 2 18.  Pleural effusion, tapped 1L 03/30/12 19.   Plan:  1. He appears to be adequately diuresed, although BNP has gone up and sodium remains low. ? Reset osmostat? Clinically his left pleural effusion is minimal on exam and x-ray and he is total -15L since admit, weight has been decreasing. HR in 50's but some NSVT overnight. Although he has multiple co-morbidities, I do not think this excludes an AICD for primary prevention of SCD at some point down the road. Therefore, I would have the LifeVest fitted today. I think he his ready for discharge later today. Appreciate  consultant recommendations regarding continuing demeclocycline or not. Home on lasix 40 mg daily. He will need an ambulatory O2 saturation recorded prior to discharge but will likely need home oxygen.  Time Spent Directly with Patient:  30 minutes  Length of Stay:  LOS: 20 days   Chrystie Nose, MD, [facility] Attending Cardiologist [facility]  Sukanya Goldblatt C 04/05/2012, 9:22 AM

## 2012-04-05 NOTE — Progress Notes (Signed)
Nutrition Follow-up  Intervention:   No nutrition interventions   Assessment:   Continue to eat well. Down about 15 L this admission. Lifevest to be fitted today and possible d/c after that.  Denies any additional nutrition needs.   Diet Order:  Heart PO intake: ~100% Supplements: none   Meds: Scheduled Meds:   . antiseptic oral rinse  15 mL Mouth Rinse BID  . aspirin  81 mg Oral Daily  . atorvastatin  20 mg Oral q1800  . clopidogrel  75 mg Oral Q breakfast  . demeclocycline  300 mg Oral q morning - 10a  . enoxaparin (LOVENOX) injection  40 mg Subcutaneous Q24H  . furosemide  40 mg Oral Daily  . gabapentin  600 mg Oral BID  . isosorbide mononitrate  30 mg Oral Daily  . lidocaine  1 patch Transdermal Q24H  . metoprolol succinate  12.5 mg Oral Daily  . nicotine  14 mg Transdermal Daily  . pantoprazole  40 mg Oral Q1200  . spironolactone  12.5 mg Oral BID  . tiotropium  18 mcg Inhalation Daily   Continuous Infusions:  PRN Meds:.sodium chloride, acetaminophen, albuterol, fentaNYL, guaiFENesin-dextromethorphan, levalbuterol, nitroGLYCERIN, ondansetron (ZOFRAN) IV, traMADol  Labs:  CMP     Component Value Date/Time   NA 127* 04/05/2012 0636   K 3.7 04/05/2012 0636   CL 87* 04/05/2012 0636   CO2 33* 04/05/2012 0636   GLUCOSE 86 04/05/2012 0636   BUN 20 04/05/2012 0636   CREATININE 1.23 04/05/2012 0636   CALCIUM 8.8 04/05/2012 0636   PROT 6.9 03/30/2012 1417   ALBUMIN 3.4* 03/29/2012 0522   AST 94* 03/19/2012 0500   ALT 45 03/19/2012 0500   ALKPHOS 58 03/19/2012 0500   BILITOT 0.2* 03/19/2012 0500   GFRNONAA 58* 04/05/2012 0636   GFRAA 67* 04/05/2012 0636     Intake/Output Summary (Last 24 hours) at 04/05/12 1108 Last data filed at 04/05/12 0900  Gross per 24 hour  Intake    900 ml  Output    875 ml  Net     25 ml    Weight Status:  125 lbs - down with neg fluid balance  Admission weight: 136 lbs  Re-estimated needs:  1500-1700 kcal, 55-65 gm protein   Nutrition  Dx:  none  Goal:  Continue to meet >90% estimated nutrition needs  Monitor:  PO intake   Clarene Duke RD, LDN Pager [phone] After Hours pager [phone]

## 2012-04-06 LAB — BASIC METABOLIC PANEL
CO2: 32 mEq/L (ref 19–32)
GFR calc non Af Amer: 52 mL/min — ABNORMAL LOW (ref >90)
Glucose, Bld: 85 mg/dL (ref 70–99)
Potassium: 3.8 mEq/L (ref 3.5–5.1)
Sodium: 128 mEq/L — ABNORMAL LOW (ref 135–145)

## 2012-04-06 MED ORDER — FUROSEMIDE 40 MG PO TABS: 40.0000 mg | ORAL_TABLET | Freq: Every day | ORAL | Status: DC

## 2012-04-06 MED ORDER — ALBUTEROL SULFATE (5 MG/ML) 0.5% IN NEBU: 2.5000 mg | INHALATION_SOLUTION | RESPIRATORY_TRACT | Status: DC | PRN

## 2012-04-06 MED ORDER — SPIRONOLACTONE 25 MG PO TABS: 12.5000 mg | ORAL_TABLET | Freq: Two times a day (BID) | ORAL | Status: DC

## 2012-04-06 MED ORDER — ISOSORBIDE MONONITRATE ER 30 MG PO TB24: 30.0000 mg | ORAL_TABLET | Freq: Every day | ORAL | Status: AC

## 2012-04-06 MED ORDER — ACETAMINOPHEN 325 MG PO TABS: 650.0000 mg | ORAL_TABLET | ORAL | Status: DC | PRN

## 2012-04-06 MED ORDER — PANTOPRAZOLE SODIUM 40 MG PO TBEC: 40.0000 mg | DELAYED_RELEASE_TABLET | Freq: Every day | ORAL | Status: DC

## 2012-04-06 MED ORDER — ATORVASTATIN CALCIUM 20 MG PO TABS: 20.0000 mg | ORAL_TABLET | Freq: Every day | ORAL | Status: DC

## 2012-04-06 MED ORDER — DEMECLOCYCLINE HCL 150 MG PO TABS: 300.0000 mg | ORAL_TABLET | Freq: Every morning | ORAL | Status: DC

## 2012-04-06 MED ORDER — TRAMADOL HCL 50 MG PO TABS: 50.0000 mg | ORAL_TABLET | Freq: Four times a day (QID) | ORAL | Status: DC | PRN

## 2012-04-06 MED ORDER — TIOTROPIUM BROMIDE MONOHYDRATE 18 MCG IN CAPS: 18.0000 ug | ORAL_CAPSULE | Freq: Every day | RESPIRATORY_TRACT | Status: DC

## 2012-04-06 MED ORDER — ATORVASTATIN CALCIUM 20 MG PO TABS: 40.0000 mg | ORAL_TABLET | Freq: Every day | ORAL | Status: DC

## 2012-04-06 MED ORDER — GUAIFENESIN-DM 100-10 MG/5ML PO SYRP: 5.0000 mL | ORAL_SOLUTION | ORAL | Status: DC | PRN

## 2012-04-06 MED ORDER — LIDOCAINE 5 % EX PTCH: 1.0000 | MEDICATED_PATCH | CUTANEOUS | Status: DC

## 2012-04-06 MED ORDER — NICOTINE 14 MG/24HR TD PT24: 1.0000 | MEDICATED_PATCH | Freq: Every day | TRANSDERMAL | Status: DC

## 2012-04-06 MED ORDER — METOPROLOL SUCCINATE ER 25 MG PO TB24: 12.5000 mg | ORAL_TABLET | Freq: Every day | ORAL | Status: DC

## 2012-04-06 MED ORDER — ASPIRIN 81 MG PO CHEW: 81.0000 mg | CHEWABLE_TABLET | Freq: Every day | ORAL | Status: DC

## 2012-04-06 MED ORDER — NITROGLYCERIN 0.4 MG SL SUBL: 0.4000 mg | SUBLINGUAL_TABLET | SUBLINGUAL | Status: AC | PRN

## 2012-04-06 NOTE — Progress Notes (Signed)
CARDIAC REHAB PHASE I   PRE:  Rate/Rhythm: 56 SB    BP: sitting 96/54    SaO2: 90-93 RA   MODE:  Ambulation: 800 ft   POST:  Rate/Rhythm: 70    BP: sitting 130/66     SaO2: 96 2L  Tolerated very well. Sts he spent the night without O2 but pt clearly needed O2 walking yesterday. Instructed pt to wear O2 at home when exerted himself. Reviewed ed. Sts quitting smoking will not be a problem. Nervous about sodium restriction.  [id number]  Harriet Masson CES, ACSM

## 2012-04-06 NOTE — Progress Notes (Signed)
Pt. Seen and examined. Agree with the NP/PA-C note as written.  Stable hemodynamically. Hemoptysis is scant at best. Looks great. Slept well last night without oxygen. Home with oxygen while ambulating and at night. He has been educated on the lifevest and it is fitted. Ok for discharged today. Follow-up in our office.  Chrystie Nose, MD, [facility] Attending Cardiologist [facility]

## 2012-04-06 NOTE — Progress Notes (Signed)
Subjective: "red spots in sputum"  Small streaks of blood in sputum  Objective: Vital signs in last 24 hours: Temp:  [97.1 F (36.2 C)-98.5 F (36.9 C)] 97.8 F (36.6 C) (10/16 0906) Pulse Rate:  [52-60] 60  (10/16 0906) Resp:  [18-20] 18  (10/16 0906) BP: (112-128)/(41-69) 112/69 mmHg (10/16 0906) SpO2:  [90 %-96 %] 95 % (10/16 0906) Weight:  [56.2 kg (123 lb 14.4 oz)] 56.2 kg (123 lb 14.4 oz) (10/16 0449) Weight change: -1.588 kg (-3 lb 8 oz) Last BM Date: 04/05/12 Intake/Output from previous day: -1925 10/15 0701 - 10/16 0700 In: 600 [P.O.:600] Out: 2525 [Urine:2525] Intake/Output this shift: Total I/O In: 240 [P.O.:240] Out: -   PE: General:alert and oriented Heart:S1S2RRR  No V tach or a fib on monitor Lungs: Abd: Ext:no edema    Lab Results: BMET  Basename 04/06/12 0530 04/05/12 0636  NA 128* 127*  K 3.8 3.7  CL 89* 87*  CO2 32 33*  GLUCOSE 85 86  BUN 23 20  CREATININE 1.34 1.23  CALCIUM 8.9 8.8    Lab Results  Component Value Date   CHOL 103 03/30/2012   HDL 48 03/17/2012   LDLCALC 58 03/17/2012   TRIG 85 03/17/2012   CHOLHDL 2.6 03/17/2012   Lab Results  Component Value Date   HGBA1C 5.4 03/17/2012     Lab Results  Component Value Date   TSH 2.506 03/25/2012      EKG: Orders placed during the hospital encounter of 03/16/12  . EKG 12-LEAD  . EKG 12-LEAD  . EKG 12-LEAD  . EKG 12-LEAD  . EKG 12-LEAD  . EKG 12-LEAD  . EKG 12-LEAD  . EKG 12-LEAD  . EKG 12-LEAD  . EKG 12-LEAD  . EKG 12-LEAD  . EKG 12-LEAD  . EKG 12-LEAD  . EKG 12-LEAD  . EKG 12-LEAD  . EKG 12-LEAD  . EKG 12-LEAD  . EKG 12-LEAD  . EKG 12-LEAD  . EKG 12-LEAD  . EKG 12-LEAD  . EKG 12-LEAD  . EKG 12-LEAD  . EKG 12-LEAD  . EKG  . EKG 12-LEAD  . EKG 12-LEAD  . EKG 12-LEAD  . EKG 12-LEAD  . EKG 12-LEAD  . EKG 12-LEAD  . EKG 12-LEAD  . EKG 12-LEAD  . EKG 12-LEAD  . EKG 12-LEAD    Studies/Results: Dg Chest 2 View  04/05/2012  *RADIOLOGY REPORT*  Clinical Data:  Follow up effusion  CHEST - 2 VIEW  Comparison: 04/03/2012  Findings: Small left pleural effusion, decreased.  Chronic interstitial markings/emphysematous changes.  No frank interstitial edema.  The heart is normal in size.  Near complete mid thoracic compression deformity, unchanged.  IMPRESSION: Small left pleural effusion, decreased.   Original Report Authenticated By: Charline Bills, M.D.     Medications: I have reviewed the patient's current medications.    Marland Kitchen antiseptic oral rinse  15 mL Mouth Rinse BID  . aspirin  81 mg Oral Daily  . atorvastatin  20 mg Oral q1800  . clopidogrel  75 mg Oral Q breakfast  . demeclocycline  300 mg Oral q morning - 10a  . enoxaparin (LOVENOX) injection  40 mg Subcutaneous Q24H  . furosemide  40 mg Oral Daily  . gabapentin  600 mg Oral BID  . isosorbide mononitrate  30 mg Oral Daily  . lidocaine  1 patch Transdermal Q24H  . metoprolol succinate  12.5 mg Oral Daily  . nicotine  14 mg Transdermal Daily  . pantoprazole  40  mg Oral Q1200  . spironolactone  12.5 mg Oral BID  . tiotropium  18 mcg Inhalation Daily   Assessment/Plan: Principal Problem:  *STEMI, unsuccessful RCA PCI 03/17/12 Active Problems:  Tobacco abuse  COPD (chronic obstructive pulmonary disease)  Acute respiratory failure, on admit and recurrent 03/28/12  Acute pulmonary edema  Altered mental status  Hyponatremia, acute on chronic. ? secondary to volume vs SIADH vs drug related  COPD with respiratory failure, acute  PVD, Lt Ax-fem and fem-fem [facility] 2/12, Dr Manson Passey follows  HTN (hypertension)  Dyslipidemia  History of CVA (cerebrovascular accident)  PAF, post MI,  on Amiodarone  NSVT, post MI, bradycardia at baseline limiting beta blocker  Ischemic cardiomyopathy, EF 30-35% by echo 03/29/12  Other encephalopathy, Acute, secondary to hypotension and resp. failure on admit now resolved at discharge  At risk for sudden cardiac death, due to ICM, d/c'd with lifevest  Hypoxia - with  ambulation, SaO2 to 87% on RA.; not previously on Home O2. COPD + ICM  Hyposmolality and/or hyponatremia  Pericardial effusion, moderate on echo 03/29/12  Diastolic dysfunction, grade 2  Pleural effusion, tapped 1L 03/30/12, small pl effusion at d/c  PLAN: Na 128 stable Pl effusion small Lifevest at discharge he has been re-instructed on use.  Ok for discharge today once MD evaluates, hemoptysis   LOS: 21 days   [name] [name] 04/06/2012, 12:14 PM

## 2012-04-06 NOTE — Progress Notes (Signed)
Pharmacist Heart Failure Core Measure Documentation  Assessment: [name] [name] has an EF documented as 35-40% on 03/29/12 by ECHO  Rationale: Heart failure patients with left ventricular systolic dysfunction (LVSD) and an EF < 40% should be prescribed an angiotensin converting enzyme inhibitor (ACEI) or angiotensin receptor blocker (ARB) at discharge unless a contraindication is documented in the medical record.  This patient is not currently on an ACEI or ARB for HF.  This note is being placed in the record in order to provide documentation that a contraindication to the use of these agents is present for this encounter.  ACE Inhibitor or Angiotensin Receptor Blocker is contraindicated (specify all that apply)  []   ACEI allergy AND ARB allergy []   Angioedema []   Moderate or severe aortic stenosis []   Hyperkalemia [x]   Hypotension []   Renal artery stenosis []   Worsening renal function, preexisting renal disease or dysfunction   [name] 04/06/2012 10:33 AM

## 2012-04-06 NOTE — Progress Notes (Signed)
Patient's IV and telemetry has been discontinued; patient and daughter verbalizes understanding of discharge instructions.

## 2012-04-11 ENCOUNTER — Encounter: Payer: Self-pay | Admitting: Emergency Medicine

## 2012-04-11 ENCOUNTER — Ambulatory Visit (INDEPENDENT_AMBULATORY_CARE_PROVIDER_SITE_OTHER): Payer: Medicare Other | Admitting: Emergency Medicine

## 2012-04-11 VITALS — BP 110/60 | HR 54 | Temp 98.6°F | Ht 68.0 in | Wt 131.6 lb

## 2012-04-11 DIAGNOSIS — I219 Acute myocardial infarction, unspecified: Secondary | ICD-10-CM

## 2012-04-11 DIAGNOSIS — R918 Other nonspecific abnormal finding of lung field: Secondary | ICD-10-CM

## 2012-04-11 DIAGNOSIS — J449 Chronic obstructive pulmonary disease, unspecified: Secondary | ICD-10-CM

## 2012-04-11 DIAGNOSIS — I213 ST elevation (STEMI) myocardial infarction of unspecified site: Secondary | ICD-10-CM

## 2012-04-11 NOTE — Progress Notes (Signed)
Subjective:    Patient ID: [name] [name], male    DOB: 1941/06/11, 71 y.o.   MRN: [id number]  HPI 71  yo smoker (80 + pk-yrs), hx PVD,  03/04/2011  recent admission for ventral hernia repair.  Has CXR and Abd CT with LLL atx vs PNA, treated w abx. Also with 2 small 4mm nodules. Here for f/u. Has been doing well, no dyspnea, abd incision doing well.  >follow up w/ PFT and repeat CT in 09/2011   Acute OV 07/23/11 Pt presents for an acute workin visit.  Complains of prod cough with brownish white mucus, sinus pressure/congestion, wheezing in the AM, pain in left side of chest, low grade temp x2weeks . OTC not helping.  Has a lot of drainage and hoarseness.  Still smoking. He is back to work.   ROV 08/28/11 -- tobacco use w COPD, hx pulm nodules.  Was seen in urgent care 6 days ago for back pain, CXR done that showed compression fx, no infiltrates. He is to see Dr Darrelyn Hillock regarding the workup. Was rx for acute bronchitis end of January - feels much better now from breathing standpoint. Cough is better, still with some sinus drainage. Able to exert, minimal SOB. Hasn't has PFT, needs repeat CT scan to follow pulm nodules in March or April of this year.   ROV 10/13/11 -- tobacco use w COPD, hx pulm nodules. He tells me that he wants to stop smoking and is going to use wellbutrin and nicotine patches. Had CT scan that showed stable nodules as below. Had PFT today: mild AFL, normal volumes, decreased diffusion. Denies any dyspnea or other pulmonary symptoms.   ROV 04/11/12 -- Hx COPD, stable pulm nodules, CAD - just discharged after admission for an acute MI and associated respiratory failure. He quit tobacco after that admission. His AFL is actually mild by spirometry. Remains on Spiriva, uses albuterol prn. Has not needed the SABA. Cough is better, no wheeze.   PULMONARY FUNCTON TEST 10/13/2011  FVC 4.14  FEV1 2.4  FEV1/FVC 58  FVC  % Predicted 102  FEV % Predicted 87  FeF 25-75 0.86  FeF 25-75 %  Predicted 2.56       Objective:   Physical Exam Filed Vitals:   04/11/12 1505  BP: 110/60  Pulse: 54  Temp: 98.6 F (37 C)   GEN: A/Ox3; pleasant , NAD, well nourished   HEENT:  [place]/AT,  EACs-clear, TMs-wnl, NOSE-clear, THROAT-clear, no lesions, no postnasal drip or exudate noted.   NECK:  Supple w/ fair ROM; no JVD; normal carotid impulses   no thyromegaly or nodules palpated; no lymphadenopathy.  RESP  Coarse BS w/o, wheezes/ rales/ or rhonchi.no accessory muscle use, no dullness to percussion  CARD:  RRR, no m/r/g  , no peripheral edema, pulses intact, no cyanosis or clubbing  Musco: Warm bil, no deformities or joint swelling noted.   Neuro: alert, no focal deficits noted.    Skin: Warm, no lesions or rashes   CT chest 09/29/11:  IMPRESSION:  1. No acute cardiopulmonary abnormalities.  2. Pulmonary nodules in the right base are unchanged from  05/10/2009. This demonstrates a stability of approximately 25  months. These are most likely benign.      Assessment & Plan:  Pulmonary nodules Stable x 2 yrs by CT scan  COPD (chronic obstructive pulmonary disease) Mild by spirometry - continue spiriva - congratulated cigarette cessation - walking oximetry today - suspect we can d/c home o2  STEMI, unsuccessful RCA PCI 03/17/12 F/u planned with Dr Allyson Sabal. Will defer  Declomycin dosing to cardiology. BMP pending

## 2012-04-11 NOTE — Patient Instructions (Addendum)
Please continue your Spiriva daily Use Albuterol as needed Follow with Dr Allyson Sabal as planned Follow with Dr Delton Coombes in 6 months or sooner if you have any problems

## 2012-04-11 NOTE — Assessment & Plan Note (Addendum)
F/u planned with Dr Allyson Sabal. Will defer  Declomycin dosing to cardiology. BMP pending

## 2012-04-11 NOTE — Assessment & Plan Note (Signed)
Stable x 2 yrs by CT scan

## 2012-04-11 NOTE — Assessment & Plan Note (Signed)
Mild by spirometry - continue spiriva - congratulated cigarette cessation - walking oximetry today - suspect we can d/c home o2

## 2012-04-20 ENCOUNTER — Telehealth: Payer: Self-pay | Admitting: Emergency Medicine

## 2012-04-20 NOTE — Telephone Encounter (Signed)
Spoke with [name] at [facility] there was some confusion About who would fill   pts Demeclocycline  This ,was filled on 04-19-12 by Nada Boozer NP per Pharmacy . Nothing further was needed.

## 2012-04-25 ENCOUNTER — Telehealth: Payer: Self-pay | Admitting: Emergency Medicine

## 2012-04-25 NOTE — Telephone Encounter (Signed)
I spoke with Lanora Manis from [facility] and she stated pt can not afford the declomycin. It is $43.00 per month. I advised her Dr. Hazle Coca office (pt cardiology) is the one dosing pt medication. Per Lanora Manis she did call them and was advised they referred pt to RB and he needs to be the one to change this or make further recs. Please advise Dr. Delton Coombes thanks

## 2012-04-26 ENCOUNTER — Encounter: Payer: Self-pay | Admitting: Neurosurgery

## 2012-04-26 NOTE — Telephone Encounter (Signed)
Please ask him to stop it. We can check his blood work at next visit to see if it has affected his sodium level. Thanks

## 2012-04-26 NOTE — Telephone Encounter (Signed)
Called spoke with patient, advised of RB's recs to stop the declomycin and bmp check at next ov.  Pt okay with these recs but is asking for clarification on why he was on the medication.  Pt stated that his pharmacist told him it is an abx and he is unsure why he needed to be on one.  Dr Delton Coombes please advise, thanks.  Called [facility] and spoke with Lanora Manis, she is aware that pt is to stop the declomycin and that the pt is already aware.

## 2012-04-27 ENCOUNTER — Encounter (INDEPENDENT_AMBULATORY_CARE_PROVIDER_SITE_OTHER): Payer: Medicare Other | Admitting: *Deleted

## 2012-04-27 ENCOUNTER — Encounter: Payer: Self-pay | Admitting: Neurosurgery

## 2012-04-27 ENCOUNTER — Ambulatory Visit (INDEPENDENT_AMBULATORY_CARE_PROVIDER_SITE_OTHER): Payer: Medicare Other | Admitting: Neurosurgery

## 2012-04-27 VITALS — BP 134/73 | HR 70 | Resp 16 | Ht 68.0 in | Wt 131.4 lb

## 2012-04-27 DIAGNOSIS — Z48812 Encounter for surgical aftercare following surgery on the circulatory system: Secondary | ICD-10-CM

## 2012-04-27 DIAGNOSIS — I739 Peripheral vascular disease, unspecified: Secondary | ICD-10-CM

## 2012-04-27 DIAGNOSIS — I70219 Atherosclerosis of native arteries of extremities with intermittent claudication, unspecified extremity: Secondary | ICD-10-CM

## 2012-04-27 NOTE — Telephone Encounter (Signed)
It was started by [facility] (not by me) but I presume it was started because one of it's effects (side-effects) is to change serum sodium levels - his were abnormal in the hospital as they were diuresing him

## 2012-04-27 NOTE — Telephone Encounter (Signed)
Called, spoke with pt.  Informed him of below per Dr. Delton Coombes.  He verbalized understanding and voiced no further questions or concerns at this time.

## 2012-04-27 NOTE — Progress Notes (Signed)
[facility] [place] [facility] Office Note  CC: PVD surveillance Referring Physician: Edilia Bo  History of Present Illness: 71 year old male patient of Dr. Edilia Bo who status post a left axillobifemoral graft placed February 2012. The patient denies claudication, rest pain or any open ulcerations on his lower extremities. The patient denies any new medical diagnoses or recent surgeries.  Past Medical History  Diagnosis Date  . Hypertension   . Hernia   . Stroke 2004    minor  . Hyperlipidemia   . Peripheral vascular disease   . Shoulder fracture, left   . COPD (chronic obstructive pulmonary disease)   . At risk for sudden cardiac death, due to [facility] 2012/03/31  . CHF (congestive heart failure)   . Cancer     skin cancer    ROS: [x]  Positive   [ ]  Denies    General: [ ]  Weight loss, [ ]  Fever, [ ]  chills Neurologic: [ ]  Dizziness, [ ]  Blackouts, [ ]  Seizure [ ]  Stroke, [ ]  "Mini stroke", [ ]  Slurred speech, [ ]  Temporary blindness; [ ]  weakness in arms or legs, [ ]  Hoarseness Cardiac: [ ]  Chest pain/pressure, [ ]  Shortness of breath at rest [ ]  Shortness of breath with exertion, [ ]  Atrial fibrillation or irregular heartbeat Vascular: [ ]  Pain in legs with walking, [ ]  Pain in legs at rest, [ ]  Pain in legs at night,  [ ]  Non-healing ulcer, [ ]  Blood clot in vein/DVT,   Pulmonary: [ ]  Home oxygen, [ ]  Productive cough, [ ]  Coughing up blood, [ ]  Asthma,  [ ]  Wheezing Musculoskeletal:  [ ]  Arthritis, [ ]  Low back pain, [ ]  Joint pain Hematologic: [ ]  Easy Bruising, [ ]  Anemia; [ ]  Hepatitis Gastrointestinal: [ ]  Blood in stool, [ ]  Gastroesophageal Reflux/heartburn, [ ]  Trouble swallowing Urinary: [ ]  chronic Kidney disease, [ ]  on HD - [ ]  MWF or [ ]  TTHS, [ ]  Burning with urination, [ ]  Difficulty urinating Skin: [ ]  Rashes, [ ]  Wounds Psychological: [ ]  Anxiety, [ ]  Depression   Social History History  Substance Use Topics  . Smoking status: Former Smoker  -- 1.0 packs/day for 50 years    Types: Cigarettes    Quit date: 03/16/2012  . Smokeless tobacco: Never Used  . Alcohol Use: 4.2 oz/week    7 Cans of beer per week     Comment: 2 drinks per day    Family History Family History  Problem Relation Age of Onset  . Alzheimer's disease Mother   . Cancer Father     prostate  . Cancer Brother     prostate  . Stroke Brother     Allergies  Allergen Reactions  . Ceclor (Cefaclor) Other (See Comments)    Reaction unknown    Current Outpatient Prescriptions  Medication Sig Dispense Refill  . acetaminophen (TYLENOL) 325 MG tablet Take 2 tablets (650 mg total) by mouth every 4 (four) hours as needed.      Marland Kitchen albuterol (PROVENTIL) (5 MG/ML) 0.5% nebulizer solution Take 0.5 mLs (2.5 mg total) by nebulization every 4 (four) hours as needed for wheezing or shortness of breath.  20 mL  11  . aspirin 81 MG chewable tablet Chew 1 tablet (81 mg total) by mouth daily.      Marland Kitchen atorvastatin (LIPITOR) 20 MG tablet Take 2 tablets (40 mg total) by mouth daily at 6 PM.  30 tablet  11  . clopidogrel (PLAVIX)  75 MG tablet Take 75 mg by mouth daily.      . furosemide (LASIX) 40 MG tablet Take 1 tablet (40 mg total) by mouth daily.  30 tablet  11  . gabapentin (NEURONTIN) 600 MG tablet Take 600 mg by mouth 2 (two) times daily.       Marland Kitchen guaiFENesin-dextromethorphan (ROBITUSSIN DM) 100-10 MG/5ML syrup Take 5 mLs by mouth every 4 (four) hours as needed for cough.  118 mL    . isosorbide mononitrate (IMDUR) 30 MG 24 hr tablet Take 1 tablet (30 mg total) by mouth daily.  30 tablet  6  . lidocaine (LIDODERM) 5 % Place 1 patch onto the skin daily. Remove & Discard patch within 12 hours or as directed by MD  30 patch  1  . metoprolol succinate (TOPROL-XL) 25 MG 24 hr tablet Take 0.5 tablets (12.5 mg total) by mouth daily.  30 tablet  6  . nicotine (NICODERM CQ - DOSED IN MG/24 HOURS) 14 mg/24hr patch Place 1 patch onto the skin daily.  28 patch  4  . nitroGLYCERIN  (NITROSTAT) 0.4 MG SL tablet Place 1 tablet (0.4 mg total) under the tongue every 5 (five) minutes x 3 doses as needed for chest pain.  25 tablet  4  . oxyCODONE-acetaminophen (PERCOCET) 10-650 MG per tablet Take 1 tablet by mouth every 4 (four) hours as needed. For pain      . pantoprazole (PROTONIX) 40 MG tablet Take 1 tablet (40 mg total) by mouth daily at 12 noon.  30 tablet  2  . risedronate (ACTONEL) 35 MG tablet Take 35 mg by mouth every 7 (seven) days. Take on Wednesdays.  Take with water on empty stomach, nothing by mouth or lie down for next 30 minutes.      Marland Kitchen spironolactone (ALDACTONE) 25 MG tablet Take 0.5 tablets (12.5 mg total) by mouth 2 (two) times daily.  30 tablet  6  . tiotropium (SPIRIVA) 18 MCG inhalation capsule Place 1 capsule (18 mcg total) into inhaler and inhale daily.  30 capsule  6  . traMADol (ULTRAM) 50 MG tablet Take 1 tablet (50 mg total) by mouth every 6 (six) hours as needed.  30 tablet  1  . demeclocycline (DECLOMYCIN) 150 MG tablet Take 2 tablets (300 mg total) by mouth every morning.  30 tablet  11    Physical Examination  Filed Vitals:   04/27/12 1207  BP: 134/73  Pulse: 70  Resp: 16    Body mass index is 19.98 kg/(m^2).  General:  WDWN in NAD Gait: Normal HEENT: WNL Eyes: Pupils equal Pulmonary: normal non-labored breathing , without Rales, rhonchi,  wheezing Cardiac: RRR, without  Murmurs, rubs or gallops; No carotid bruits Abdomen: soft, NT, no masses Skin: no rashes, ulcers noted Vascular Exam/Pulses: Palpable DP pulses bilaterally  Extremities without ischemic changes, no Gangrene , no cellulitis; no open wounds;  Musculoskeletal: no muscle wasting or atrophy  Neurologic: A&O X 3; Appropriate Affect ; SENSATION: normal; MOTOR FUNCTION:  moving all extremities equally. Speech is fluent/normal  Non-Invasive Vascular Imaging: ABIs today were 0.65 and monophasic to biphasic on the right, 0.78 and biphasic on the left which is slight  improvement from previous exam.  ASSESSMENT/PLAN: Asymptomatic patient with no signs of claudication or rest pain. The patient will followup in one year with repeat ABIs. The patient's questions were encouraged and answered, he is in agreement with this plan.  Lauree Chandler ANP  Clinic M.D.: Edilia Bo

## 2012-04-30 ENCOUNTER — Ambulatory Visit: Payer: Medicare Other

## 2012-04-30 ENCOUNTER — Ambulatory Visit (INDEPENDENT_AMBULATORY_CARE_PROVIDER_SITE_OTHER): Payer: Medicare Other | Admitting: Emergency Medicine

## 2012-04-30 VITALS — BP 149/77 | HR 72 | Temp 97.5°F | Resp 16 | Ht 66.0 in | Wt 129.6 lb

## 2012-04-30 DIAGNOSIS — M542 Cervicalgia: Secondary | ICD-10-CM

## 2012-04-30 DIAGNOSIS — H6120 Impacted cerumen, unspecified ear: Secondary | ICD-10-CM

## 2012-04-30 MED ORDER — OXYCODONE-ACETAMINOPHEN 10-650 MG PO TABS: 1.0000 | ORAL_TABLET | ORAL | Status: DC | PRN

## 2012-04-30 NOTE — Patient Instructions (Signed)
Patient will follow up with her regular doctor next week regarding her neck pain to see if any other imaging is necessary

## 2012-04-30 NOTE — Progress Notes (Signed)
  Subjective:    Patient ID: [name] [name], male    DOB: 26-Jan-1941, 71 y.o.   MRN: [id number]  HPI Pt presents to clinic today accompanied by Granddaughter. Pt c/o Lt sided neck pain. Pt has changed PCP Dr's from Dr. Jeannetta [name] and he is now seeing Dr. Ricki [name]. Pt released from hospital about 1 month ago and is on all new meds- no blood thinners. Pt states he was in hospital from fluid build up in lungs and heart. While in hospital he also suffered from an MI. He was on the ventilator for a good period of time and is now recovered with followup visits to [place] [facility]. He has pain with movement of the neck appear   Review of Systems     Objective:   Physical Exam the right TM is occluded with wax. Left TM is normal. Neck is supple chest is clear cardiac exam is unremarkable. There is a large graft which originates in the left anterior chest extends down the lateral left chest wall and into the abdomen. He has very limited range of motion in the neck. He is unable to turn his neck to the left at all. There is no focal upper extremity weakness  [facility] reading (PRIMARY) by  Dr.Daub C5 C6 degenerative disc disease with vertebral compression C5 and C6 vertebral bodies Fractures      Assessment & Plan:  A prescription for hydrocodone for pain. He is to follow up with his regular doctor the first of the week to decide.if any further imaging is necessary .

## 2012-05-05 ENCOUNTER — Other Ambulatory Visit (HOSPITAL_COMMUNITY): Payer: Self-pay | Admitting: Cardiovascular Disease

## 2012-05-05 DIAGNOSIS — I6529 Occlusion and stenosis of unspecified carotid artery: Secondary | ICD-10-CM

## 2012-05-12 ENCOUNTER — Inpatient Hospital Stay (HOSPITAL_COMMUNITY)
Admission: EM | Admit: 2012-05-12 | Discharge: 2012-05-18 | DRG: 292 | Disposition: A | Discharge status: Home / Self Care | Payer: Medicare Other | Attending: Cardiology | Admitting: Cardiology

## 2012-05-12 ENCOUNTER — Emergency Department (HOSPITAL_COMMUNITY): Payer: Medicare Other

## 2012-05-12 ENCOUNTER — Encounter (HOSPITAL_COMMUNITY): Payer: Self-pay | Admitting: *Deleted

## 2012-05-12 DIAGNOSIS — I313 Pericardial effusion (noninflammatory): Secondary | ICD-10-CM | POA: Diagnosis present

## 2012-05-12 DIAGNOSIS — I252 Old myocardial infarction: Secondary | ICD-10-CM

## 2012-05-12 DIAGNOSIS — I2589 Other forms of chronic ischemic heart disease: Secondary | ICD-10-CM | POA: Diagnosis present

## 2012-05-12 DIAGNOSIS — Z8673 Personal history of transient ischemic attack (TIA), and cerebral infarction without residual deficits: Secondary | ICD-10-CM

## 2012-05-12 DIAGNOSIS — M1990 Unspecified osteoarthritis, unspecified site: Secondary | ICD-10-CM | POA: Diagnosis present

## 2012-05-12 DIAGNOSIS — Z720 Tobacco use: Secondary | ICD-10-CM | POA: Diagnosis present

## 2012-05-12 DIAGNOSIS — R079 Chest pain, unspecified: Secondary | ICD-10-CM

## 2012-05-12 DIAGNOSIS — I739 Peripheral vascular disease, unspecified: Secondary | ICD-10-CM | POA: Diagnosis present

## 2012-05-12 DIAGNOSIS — Z87891 Personal history of nicotine dependence: Secondary | ICD-10-CM

## 2012-05-12 DIAGNOSIS — I319 Disease of pericardium, unspecified: Secondary | ICD-10-CM | POA: Diagnosis present

## 2012-05-12 DIAGNOSIS — J449 Chronic obstructive pulmonary disease, unspecified: Secondary | ICD-10-CM | POA: Diagnosis present

## 2012-05-12 DIAGNOSIS — I5043 Acute on chronic combined systolic (congestive) and diastolic (congestive) heart failure: Principal | ICD-10-CM | POA: Diagnosis present

## 2012-05-12 DIAGNOSIS — I3139 Other pericardial effusion (noninflammatory): Secondary | ICD-10-CM | POA: Diagnosis present

## 2012-05-12 DIAGNOSIS — I509 Heart failure, unspecified: Secondary | ICD-10-CM | POA: Diagnosis present

## 2012-05-12 DIAGNOSIS — M542 Cervicalgia: Secondary | ICD-10-CM

## 2012-05-12 DIAGNOSIS — I255 Ischemic cardiomyopathy: Secondary | ICD-10-CM | POA: Diagnosis present

## 2012-05-12 DIAGNOSIS — Z85828 Personal history of other malignant neoplasm of skin: Secondary | ICD-10-CM

## 2012-05-12 DIAGNOSIS — I4891 Unspecified atrial fibrillation: Secondary | ICD-10-CM | POA: Diagnosis present

## 2012-05-12 DIAGNOSIS — J4489 Other specified chronic obstructive pulmonary disease: Secondary | ICD-10-CM | POA: Diagnosis present

## 2012-05-12 DIAGNOSIS — I10 Essential (primary) hypertension: Secondary | ICD-10-CM | POA: Diagnosis present

## 2012-05-12 DIAGNOSIS — I480 Paroxysmal atrial fibrillation: Secondary | ICD-10-CM | POA: Diagnosis not present

## 2012-05-12 DIAGNOSIS — E785 Hyperlipidemia, unspecified: Secondary | ICD-10-CM | POA: Diagnosis present

## 2012-05-12 DIAGNOSIS — I213 ST elevation (STEMI) myocardial infarction of unspecified site: Secondary | ICD-10-CM | POA: Diagnosis present

## 2012-05-12 HISTORY — DX: Atherosclerotic heart disease of native coronary artery without angina pectoris: I25.10

## 2012-05-12 HISTORY — DX: Cardiac arrhythmia, unspecified: I49.9

## 2012-05-12 LAB — POCT I-STAT 3, ART BLOOD GAS (G3+)
TCO2: 31 mmol/L (ref 0–100)
pCO2 arterial: 35.1 mmHg (ref 35.0–45.0)
pH, Arterial: 7.534 — ABNORMAL HIGH (ref 7.350–7.450)

## 2012-05-12 LAB — COMPREHENSIVE METABOLIC PANEL
ALT: 16 U/L (ref 0–53)
AST: 18 U/L (ref 0–37)
Alkaline Phosphatase: 86 U/L (ref 39–117)
CO2: 27 mEq/L (ref 19–32)
Calcium: 9.3 mg/dL (ref 8.4–10.5)
Chloride: 92 mEq/L — ABNORMAL LOW (ref 96–112)
GFR calc Af Amer: 64 mL/min — ABNORMAL LOW (ref >90)
GFR calc non Af Amer: 55 mL/min — ABNORMAL LOW (ref >90)
Glucose, Bld: 115 mg/dL — ABNORMAL HIGH (ref 70–99)
Sodium: 130 mEq/L — ABNORMAL LOW (ref 135–145)
Total Bilirubin: 0.3 mg/dL (ref 0.3–1.2)

## 2012-05-12 LAB — PROTIME-INR: INR: 1.16 (ref 0.00–1.49)

## 2012-05-12 LAB — CBC
Hemoglobin: 12.3 g/dL — ABNORMAL LOW (ref 13.0–17.0)
MCH: 29.5 pg (ref 26.0–34.0)
RBC: 4.17 MIL/uL — ABNORMAL LOW (ref 4.22–5.81)
WBC: 11.3 10*3/uL — ABNORMAL HIGH (ref 4.0–10.5)

## 2012-05-12 LAB — URINALYSIS, ROUTINE W REFLEX MICROSCOPIC
Glucose, UA: NEGATIVE mg/dL
Leukocytes, UA: NEGATIVE
Nitrite: NEGATIVE
Specific Gravity, Urine: 1.008 (ref 1.005–1.030)
pH: 7.5 (ref 5.0–8.0)

## 2012-05-12 LAB — POCT I-STAT TROPONIN I: Troponin i, poc: 0.04 ng/mL (ref 0.00–0.08)

## 2012-05-12 LAB — AFB CULTURE WITH SMEAR (NOT AT ARMC)
Acid Fast Smear: NONE SEEN
Special Requests: NORMAL

## 2012-05-12 LAB — TROPONIN I: Troponin I: <0.3 ng/mL (ref <0.30)

## 2012-05-12 LAB — CK TOTAL AND CKMB (NOT AT ARMC): Relative Index: INVALID (ref 0.0–2.5)

## 2012-05-12 MED ORDER — SODIUM CHLORIDE 0.9 % IV SOLN: 250.0000 mL | INTRAVENOUS | Status: DC | PRN

## 2012-05-12 MED ORDER — GUAIFENESIN-DM 100-10 MG/5ML PO SYRP: 5.0000 mL | ORAL_SOLUTION | ORAL | Status: DC | PRN

## 2012-05-12 MED ORDER — OXYCODONE-ACETAMINOPHEN 10-650 MG PO TABS: 1.0000 | ORAL_TABLET | ORAL | Status: DC | PRN

## 2012-05-12 MED ORDER — SODIUM CHLORIDE 0.9 % IJ SOLN
3.0000 mL | Freq: Two times a day (BID) | INTRAMUSCULAR | Status: DC
  Administered 2012-05-12 – 2012-05-18 (×13): 3 mL via INTRAVENOUS

## 2012-05-12 MED ORDER — OXYCODONE-ACETAMINOPHEN 5-325 MG PO TABS
1.0000 | ORAL_TABLET | Freq: Four times a day (QID) | ORAL | Status: DC | PRN
  Administered 2012-05-16 – 2012-05-17 (×2): 1 via ORAL
  Filled 2012-05-12 (×3): qty 1

## 2012-05-12 MED ORDER — FUROSEMIDE 10 MG/ML IJ SOLN
40.0000 mg | Freq: Once | INTRAMUSCULAR | Status: AC
  Administered 2012-05-12: 40 mg via INTRAVENOUS
  Filled 2012-05-12: qty 4

## 2012-05-12 MED ORDER — TRAMADOL HCL 50 MG PO TABS
50.0000 mg | ORAL_TABLET | Freq: Four times a day (QID) | ORAL | Status: DC | PRN
  Filled 2012-05-12: qty 1

## 2012-05-12 MED ORDER — GABAPENTIN 600 MG PO TABS
600.0000 mg | ORAL_TABLET | Freq: Two times a day (BID) | ORAL | Status: DC
  Administered 2012-05-12 – 2012-05-18 (×13): 600 mg via ORAL
  Filled 2012-05-12 (×14): qty 1

## 2012-05-12 MED ORDER — ATORVASTATIN CALCIUM 20 MG PO TABS
20.0000 mg | ORAL_TABLET | Freq: Every day | ORAL | Status: DC
  Administered 2012-05-12 – 2012-05-17 (×6): 20 mg via ORAL
  Filled 2012-05-12 (×7): qty 1

## 2012-05-12 MED ORDER — FUROSEMIDE 10 MG/ML IJ SOLN
40.0000 mg | Freq: Two times a day (BID) | INTRAMUSCULAR | Status: DC
  Administered 2012-05-12 – 2012-05-13 (×2): 40 mg via INTRAVENOUS
  Filled 2012-05-12 (×3): qty 4

## 2012-05-12 MED ORDER — ALBUTEROL SULFATE (5 MG/ML) 0.5% IN NEBU: 2.5000 mg | INHALATION_SOLUTION | RESPIRATORY_TRACT | Status: DC | PRN

## 2012-05-12 MED ORDER — NITROGLYCERIN 0.4 MG SL SUBL: 0.4000 mg | SUBLINGUAL_TABLET | SUBLINGUAL | Status: DC | PRN

## 2012-05-12 MED ORDER — LOSARTAN POTASSIUM 25 MG PO TABS
25.0000 mg | ORAL_TABLET | Freq: Every day | ORAL | Status: DC
  Administered 2012-05-12 – 2012-05-13 (×2): 25 mg via ORAL
  Filled 2012-05-12 (×2): qty 1

## 2012-05-12 MED ORDER — OXYCODONE HCL 5 MG PO TABS
5.0000 mg | ORAL_TABLET | ORAL | Status: DC | PRN
  Administered 2012-05-12 – 2012-05-17 (×6): 5 mg via ORAL
  Filled 2012-05-12 (×7): qty 1

## 2012-05-12 MED ORDER — METOPROLOL SUCCINATE 12.5 MG HALF TABLET
12.5000 mg | ORAL_TABLET | Freq: Every day | ORAL | Status: DC
  Administered 2012-05-12 – 2012-05-18 (×7): 12.5 mg via ORAL
  Filled 2012-05-12 (×7): qty 1

## 2012-05-12 MED ORDER — ACETAMINOPHEN 325 MG PO TABS: 650.0000 mg | ORAL_TABLET | ORAL | Status: DC | PRN

## 2012-05-12 MED ORDER — OXYCODONE-ACETAMINOPHEN 5-325 MG PO TABS: 1.0000 | ORAL_TABLET | Freq: Four times a day (QID) | ORAL | Status: DC | PRN

## 2012-05-12 MED ORDER — SODIUM CHLORIDE 0.9 % IJ SOLN: 3.0000 mL | INTRAMUSCULAR | Status: DC | PRN

## 2012-05-12 MED ORDER — RISEDRONATE SODIUM 5 MG PO TABS: 35.0000 mg | ORAL_TABLET | ORAL | Status: DC

## 2012-05-12 MED ORDER — SPIRONOLACTONE 12.5 MG HALF TABLET
12.5000 mg | ORAL_TABLET | Freq: Two times a day (BID) | ORAL | Status: DC
  Administered 2012-05-12 – 2012-05-18 (×12): 12.5 mg via ORAL
  Filled 2012-05-12 (×17): qty 1

## 2012-05-12 MED ORDER — TIOTROPIUM BROMIDE MONOHYDRATE 18 MCG IN CAPS
18.0000 ug | ORAL_CAPSULE | Freq: Every day | RESPIRATORY_TRACT | Status: DC
  Administered 2012-05-13 – 2012-05-18 (×6): 18 ug via RESPIRATORY_TRACT
  Filled 2012-05-12 (×2): qty 5

## 2012-05-12 MED ORDER — OXYCODONE HCL 5 MG PO TABS: 5.0000 mg | ORAL_TABLET | Freq: Four times a day (QID) | ORAL | Status: DC | PRN

## 2012-05-12 MED ORDER — PANTOPRAZOLE SODIUM 40 MG PO TBEC
40.0000 mg | DELAYED_RELEASE_TABLET | Freq: Every day | ORAL | Status: DC
  Administered 2012-05-12 – 2012-05-18 (×7): 40 mg via ORAL
  Filled 2012-05-12 (×6): qty 1

## 2012-05-12 MED ORDER — CLOPIDOGREL BISULFATE 75 MG PO TABS
75.0000 mg | ORAL_TABLET | Freq: Every day | ORAL | Status: DC
  Administered 2012-05-12 – 2012-05-18 (×7): 75 mg via ORAL
  Filled 2012-05-12 (×7): qty 1

## 2012-05-12 MED ORDER — ISOSORBIDE MONONITRATE ER 30 MG PO TB24
30.0000 mg | ORAL_TABLET | Freq: Every day | ORAL | Status: DC
  Administered 2012-05-12 – 2012-05-18 (×7): 30 mg via ORAL
  Filled 2012-05-12 (×7): qty 1

## 2012-05-12 NOTE — ED Notes (Signed)
[phone] [name] [name], pts daughter, to be contacted with any changes

## 2012-05-12 NOTE — ED Provider Notes (Addendum)
History     CSN: [id number]  Arrival date & time 05/12/12  0453   First MD Initiated Contact with Patient 05/12/12 0455      Chief Complaint  Patient presents with  . Chest Pain    (Consider location/radiation/quality/duration/timing/severity/associated sxs/prior treatment) Patient is a 71 y.o. male presenting with chest pain. The history is provided by the patient.  Chest Pain The chest pain began 1 - 2 hours ago. Duration of episode(s) is 10 minutes. Chest pain occurs constantly. The pain is associated with breathing. At its most intense, the pain is at 6/10. The pain is currently at 1/10. The severity of the pain is mild. The quality of the pain is described as aching. The pain does not radiate. Primary symptoms include shortness of breath. He tried aspirin and nitroglycerin for the symptoms.  Procedure history is positive for cardiac catheterization.     Past Medical History  Diagnosis Date  . Hypertension   . Hernia   . Stroke 2004    minor  . Hyperlipidemia   . Peripheral vascular disease   . Shoulder fracture, left   . COPD (chronic obstructive pulmonary disease)   . At risk for sudden cardiac death, due to [facility] Apr 18, 2012  . CHF (congestive heart failure)   . Cancer     skin cancer  . Heart attack     Past Surgical History  Procedure Date  . Bypass graft 07/2010    axillobifemoral BPG  . Spine surgery 11/23/06    microdiscectomy L5-S1  . Hemorrhoid surgery   . Tonsillectomy   . Hernia repair 02/09/11    [facility]    Family History  Problem Relation Age of Onset  . Alzheimer's disease Mother   . Cancer Father     prostate  . Cancer Brother     prostate  . Stroke Brother     History  Substance Use Topics  . Smoking status: Former Smoker -- 1.0 packs/day for 50 years    Types: Cigarettes    Quit date: 03/16/2012  . Smokeless tobacco: Never Used  . Alcohol Use: 4.2 oz/week    7 Cans of beer per week     Comment: 2 drinks per day      Review of  Systems  Respiratory: Positive for shortness of breath.   Cardiovascular: Positive for chest pain.  All other systems reviewed and are negative.    Allergies  Ceclor  Home Medications   Current Outpatient Rx  Name  Route  Sig  Dispense  Refill  . ACETAMINOPHEN 325 MG PO TABS   Oral   Take 2 tablets (650 mg total) by mouth every 4 (four) hours as needed.         . ASPIRIN 81 MG PO CHEW   Oral   Chew 1 tablet (81 mg total) by mouth daily.         . ATORVASTATIN CALCIUM 20 MG PO TABS   Oral   Take 20 mg by mouth daily.         Marland Kitchen CLOPIDOGREL BISULFATE 75 MG PO TABS   Oral   Take 75 mg by mouth daily.         . FUROSEMIDE 40 MG PO TABS   Oral   Take 1 tablet (40 mg total) by mouth daily.   30 tablet   11   . GABAPENTIN 600 MG PO TABS   Oral   Take 600 mg by mouth 2 (two) times daily.          Marland Kitchen  ISOSORBIDE MONONITRATE ER 30 MG PO TB24   Oral   Take 1 tablet (30 mg total) by mouth daily.   30 tablet   6   . LIDOCAINE 5 % EX PTCH   Transdermal   Place 1 patch onto the skin daily. Remove & Discard patch within 12 hours or as directed by MD   30 patch   1   . METOPROLOL SUCCINATE ER 25 MG PO TB24   Oral   Take 0.5 tablets (12.5 mg total) by mouth daily.   30 tablet   6   . NICOTINE 14 MG/24HR TD PT24   Transdermal   Place 1 patch onto the skin daily.   28 patch   4   . OXYCODONE-ACETAMINOPHEN 10-650 MG PO TABS   Oral   Take 1 tablet by mouth every 4 (four) hours as needed. For pain   20 tablet   0   . PANTOPRAZOLE SODIUM 40 MG PO TBEC   Oral   Take 1 tablet (40 mg total) by mouth daily at 12 noon.   30 tablet   2   . RISEDRONATE SODIUM 35 MG PO TABS   Oral   Take 35 mg by mouth every 7 (seven) days. Take on Wednesdays.  Take with water on empty stomach, nothing by mouth or lie down for next 30 minutes.         . SPIRONOLACTONE 25 MG PO TABS   Oral   Take 0.5 tablets (12.5 mg total) by mouth 2 (two) times daily.   30 tablet   6     . TIOTROPIUM BROMIDE MONOHYDRATE 18 MCG IN CAPS   Inhalation   Place 1 capsule (18 mcg total) into inhaler and inhale daily.   30 capsule   6   . TRAMADOL HCL 50 MG PO TABS   Oral   Take 1 tablet (50 mg total) by mouth every 6 (six) hours as needed.   30 tablet   1   . ALBUTEROL SULFATE (5 MG/ML) 0.5% IN NEBU   Nebulization   Take 0.5 mLs (2.5 mg total) by nebulization every 4 (four) hours as needed for wheezing or shortness of breath.   20 mL   11   . GUAIFENESIN-DM 100-10 MG/5ML PO SYRP   Oral   Take 5 mLs by mouth every 4 (four) hours as needed for cough.   118 mL      . NITROGLYCERIN 0.4 MG SL SUBL   Sublingual   Place 1 tablet (0.4 mg total) under the tongue every 5 (five) minutes x 3 doses as needed for chest pain.   25 tablet   4     BP 151/81  Pulse 64  Resp 16  SpO2 97%  Physical Exam  Constitutional: He is oriented to person, place, and time. He appears well-developed and well-nourished.  HENT:  Head: Normocephalic and atraumatic.  Eyes: Conjunctivae normal are normal. Pupils are equal, round, and reactive to light.  Neck: Normal range of motion. Neck supple.  Cardiovascular: Normal rate, regular rhythm, normal heart sounds and intact distal pulses.   Pulmonary/Chest: Effort normal and breath sounds normal.  Abdominal: Soft. Bowel sounds are normal.  Neurological: He is alert and oriented to person, place, and time.  Skin: Skin is warm and dry.  Psychiatric: He has a normal mood and affect. His behavior is normal. Judgment and thought content normal.    ED Course  Procedures (including critical care time)  Labs Reviewed  CBC - Abnormal; Notable for the following:    WBC 11.3 (*)     RBC 4.17 (*)     Hemoglobin 12.3 (*)     HCT 36.6 (*)     All other components within normal limits  CK TOTAL AND CKMB - Abnormal; Notable for the following:    CK, MB 5.0 (*)     All other components within normal limits  APTT  PROTIME-INR  POCT I-STAT  TROPONIN I  COMPREHENSIVE METABOLIC PANEL  URINALYSIS, ROUTINE W REFLEX MICROSCOPIC   Dg Chest Port 1 View  05/12/2012  *RADIOLOGY REPORT*  Clinical Data: Dyspnea  PORTABLE CHEST - 1 VIEW  Comparison: 04/05/2012  Findings: Cardiomegaly.  Central vascular congestion.  Increased interstitial prominence and perihilar/infrahilar opacities. Small effusions with associated consolidations suggested. Surgical clips project over the left apex.  Atherosclerotic calcification of the aorta.  Diffuse osteopenia.  IMPRESSION: Pulmonary edema versus multifocal infection.  Small pleural effusions with associated consolidations; atelectasis versus infiltrate.   Original Report Authenticated By: Jearld Lesch, M.D.      No diagnosis found.    Date: 05/12/2012  Rate: 70  Rhythm: normal sinus rhythm  QRS Axis: normal  Intervals: normal  ST/T Wave abnormalities: nonspecific ST changes  Conduction Disutrbances:none  Narrative Interpretation:   Old EKG Reviewed: none available   MDM  + chest pain, recent stent placement,  Improved with nitro,  Will reassess  Discussed with [facility],  Will come to ed to evaluate      Rosanne Ashing, MD 05/12/12 1610  Knox Royalty Lytle Michaels, MD 05/12/12 9604

## 2012-05-12 NOTE — ED Notes (Signed)
Correction 97% on 4 liters

## 2012-05-12 NOTE — H&P (Deleted)
Reason for Consult: Chest pain/SOB Primary Cardiologist: Dr. Allyson Sabal   HPI: [name] [name] is a 71 y.o.  caucasian male who was previously admitted to [facility] on March 16, 2012 with an inferior wall myocardial infarction. He was taken to the cath lab urgently by Dr. Catalina Gravel through the right radial approach. Dr. Eldridge Dace was unable to revascularization his dominant right coronary artery and the patient ended up evolving a large inferior wall myocardial infarction with complications that include A-fib and CHF. He was on amiodarone briefly and diuresed. He also was hyponatremic during his hospitalization. His other problems include COPD, though, he stopped smoking. He did have a pleural effusion requiring thoracentesis. His EF apparently was 30% to 35% prior to discharge and he was sent home on a "LifeVest."  He had a repeat echo as an OP on October 29 that revealed and EF of   .... And his "LifeVest" was d/c.  [name] [name] returned to the ED today with a complaint of new onset SOB that began 3 days ago. He reports SOB with minimal exertion (getting out of bed, walking around the house). SOB relieved with rest.  He also reports chest pain that developed yesterday. Reports pain in upper left torso, radiating to the left axilla. Sharp and constant  in nature. No worsening factors. At onset pain was 7/10. The pain was associated with SOB. Pt reports that he became diaphoretic while experiencing the CP and SOB around 3:30 am.  He then called his daughter to bring him in to the ED. He was given ASA, NTG and Lasix. Pain was relieved by NTG within 10 minutes. Currently experiencing no pain. He denies palpitations, n/v, orthopnea, PND, and LEE. He denies feeling dizzy and lightheaded.  He does however report a 2 lb weight gain over the last 2 days. He reports compliance with all of his home medications.  Past Medical History  Diagnosis Date  . Hypertension   . Hernia   . Stroke 2004    minor  .  Hyperlipidemia   . Peripheral vascular disease   . Shoulder fracture, left   . COPD (chronic obstructive pulmonary disease)   . At risk for sudden cardiac death, due to [facility] 06-Apr-2012  . CHF (congestive heart failure)   . Cancer     skin cancer  . Heart attack     Past Surgical History  Procedure Date  . Bypass graft 07/2010    axillobifemoral BPG  . Spine surgery 11/23/06    microdiscectomy L5-S1  . Hemorrhoid surgery   . Tonsillectomy   . Hernia repair 02/09/11    [facility]    Family History  Problem Relation Age of Onset  . Alzheimer's disease Mother   . Cancer Father     prostate  . Cancer Brother     prostate  . Stroke Brother     Social History:  reports that he quit smoking about 8 weeks ago. His smoking use included Cigarettes. He has a 50 pack-year smoking history. He has never used smokeless tobacco. He reports that he drinks about 4.2 ounces of alcohol per week. He reports that he does not use illicit drugs.  Allergies:  Allergies  Allergen Reactions  . Ceclor (Cefaclor) Anaphylaxis    Medications: See med rec.  Results for orders placed during the hospital encounter of 05/12/12 (from the past 48 hour(s))  CBC     Status: Abnormal   Collection Time   05/12/12  5:05 AM  Component Value Range Comment   WBC 11.3 (*) 4.0 - 10.5 K/uL    RBC 4.17 (*) 4.22 - 5.81 MIL/uL    Hemoglobin 12.3 (*) 13.0 - 17.0 g/dL    HCT 11.9 (*) 14.7 - 52.0 %    MCV 87.8  78.0 - 100.0 fL    MCH 29.5  26.0 - 34.0 pg    MCHC 33.6  30.0 - 36.0 g/dL    RDW 82.9  56.2 - 13.0 %    Platelets 327  150 - 400 K/uL   COMPREHENSIVE METABOLIC PANEL     Status: Abnormal   Collection Time   05/12/12  5:05 AM      Component Value Range Comment   Sodium 130 (*) 135 - 145 mEq/L    Potassium 4.5  3.5 - 5.1 mEq/L    Chloride 92 (*) 96 - 112 mEq/L    CO2 27  19 - 32 mEq/L    Glucose, Bld 115 (*) 70 - 99 mg/dL    BUN 17  6 - 23 mg/dL    Creatinine, Ser 8.65  0.50 - 1.35 mg/dL    Calcium 9.3   8.4 - 10.5 mg/dL    Total Protein 7.6  6.0 - 8.3 g/dL    Albumin 3.6  3.5 - 5.2 g/dL    AST 18  0 - 37 U/L    ALT 16  0 - 53 U/L    Alkaline Phosphatase 86  39 - 117 U/L    Total Bilirubin 0.3  0.3 - 1.2 mg/dL    GFR calc non Af Amer 55 (*) >90 mL/min    GFR calc Af Amer 64 (*) >90 mL/min   APTT     Status: Normal   Collection Time   05/12/12  5:05 AM      Component Value Range Comment   aPTT 30  24 - 37 seconds   PROTIME-INR     Status: Normal   Collection Time   05/12/12  5:05 AM      Component Value Range Comment   Prothrombin Time 14.6  11.6 - 15.2 seconds    INR 1.16  0.00 - 1.49   CK TOTAL AND CKMB     Status: Abnormal   Collection Time   05/12/12  5:05 AM      Component Value Range Comment   Total CK 58  7 - 232 U/L    CK, MB 5.0 (*) 0.3 - 4.0 ng/mL    Relative Index RELATIVE INDEX IS INVALID  0.0 - 2.5   POCT I-STAT TROPONIN I     Status: Normal   Collection Time   05/12/12  5:06 AM      Component Value Range Comment   Troponin i, poc 0.04  0.00 - 0.08 ng/mL    Comment 3            URINALYSIS, ROUTINE W REFLEX MICROSCOPIC     Status: Normal   Collection Time   05/12/12  7:10 AM      Component Value Range Comment   Color, Urine YELLOW  YELLOW    APPearance CLEAR  CLEAR    Specific Gravity, Urine 1.008  1.005 - 1.030    pH 7.5  5.0 - 8.0    Glucose, UA NEGATIVE  NEGATIVE mg/dL    Hgb urine dipstick NEGATIVE  NEGATIVE    Bilirubin Urine NEGATIVE  NEGATIVE    Ketones, ur NEGATIVE  NEGATIVE mg/dL  Protein, ur NEGATIVE  NEGATIVE mg/dL    Urobilinogen, UA 0.2  0.0 - 1.0 mg/dL    Nitrite NEGATIVE  NEGATIVE    Leukocytes, UA NEGATIVE  NEGATIVE MICROSCOPIC NOT DONE ON URINES WITH NEGATIVE PROTEIN, BLOOD, LEUKOCYTES, NITRITE, OR GLUCOSE <1000 mg/dL.    Dg Chest Port 1 View  05/12/2012  *RADIOLOGY REPORT*  Clinical Data: Dyspnea  PORTABLE CHEST - 1 VIEW  Comparison: 04/05/2012  Findings: Cardiomegaly.  Central vascular congestion.  Increased interstitial prominence  and perihilar/infrahilar opacities. Small effusions with associated consolidations suggested. Surgical clips project over the left apex.  Atherosclerotic calcification of the aorta.  Diffuse osteopenia.  IMPRESSION: Pulmonary edema versus multifocal infection.  Small pleural effusions with associated consolidations; atelectasis versus infiltrate.   Original Report Authenticated By: Jearld Lesch, M.D.     Review of Systems  Constitutional: Positive for diaphoresis. Negative for fever, chills and weight loss.  Respiratory: Positive for shortness of breath. Negative for cough and sputum production.   Cardiovascular: Positive for chest pain. Negative for palpitations, orthopnea, leg swelling and PND.  Gastrointestinal: Negative for nausea, vomiting, blood in stool and melena.  Genitourinary: Negative for dysuria and hematuria.  Neurological: Negative for dizziness.   Blood pressure 157/83, pulse 66, resp. rate 16, SpO2 97.00%. Physical Exam  Constitutional: He is oriented to person, place, and time. He appears well-developed and well-nourished. No distress.  HENT:  Head: Normocephalic and atraumatic.  Eyes: Conjunctivae normal and EOM are normal. Pupils are equal, round, and reactive to light.  Neck: Normal range of motion. Neck supple. No JVD present. No thyromegaly present.  Cardiovascular: Normal rate, regular rhythm, normal heart sounds and intact distal pulses.  Exam reveals no gallop and no friction rub.   No murmur heard. Pulses:      Radial pulses are 2+ on the right side, and 2+ on the left side.       Dorsalis pedis pulses are 1+ on the right side, and 1+ on the left side.  Respiratory: Effort normal. No respiratory distress. He has no wheezes. He has rales in the left lower field.  GI: Soft. Bowel sounds are normal. He exhibits no distension and no mass. There is no tenderness.  Musculoskeletal: He exhibits no edema.  Lymphadenopathy:    He has no cervical adenopathy.    Neurological: He is alert and oriented to person, place, and time.  Skin: Skin is warm and dry.  Psychiatric: He has a normal mood and affect.    Assessment/Plan: BNP elevated at 9640. Pt received 1 dose of Lasix in ED. Troponin negative x 1.    Corine Shelter K 05/12/2012, 10:28 AM

## 2012-05-12 NOTE — ED Notes (Signed)
Denies pain at this time.

## 2012-05-12 NOTE — Progress Notes (Deleted)
PHARMACIST - PHYSICIAN ORDER COMMUNICATION  CONCERNING: P&T Medication Policy on Herbal Medications  DESCRIPTION:  This patient's order for:  Actonel  has been noted.  This product(s) is classified as an "herbal" or natural product. Due to a lack of definitive safety studies or FDA approval, nonstandard manufacturing practices, plus the potential risk of unknown drug-drug interactions while on inpatient medications, the Pharmacy and Therapeutics Committee does not permit the use of "herbal" or natural products of this type within [facility].   ACTION TAKEN: The pharmacy department is unable to verify this order at this time and your patient has been informed of this safety policy. Please reevaluate patient's clinical condition at discharge and address if the herbal or natural product(s) should be resumed at that time.   Nadara Mustard, PharmD., MS Clinical Pharmacist Pager:  [phone] Thank you for allowing pharmacy to be part of this patients care team.

## 2012-05-12 NOTE — Progress Notes (Signed)
PHARMACIST - PHYSICIAN COMMUNICATION  CONCERNING: P&T Medication Policy Regarding Oral Bisphosphonates  RECOMMENDATION: Your order for alendronate (Fosamax), ibandronate (Boniva), or risedronate (Actonel) has been discontinued at this time.  If the patient's post-hospital medical condition warrants safe use of this class of drugs, please resume the pre-hospital regimen upon discharge.  DESCRIPTION:  Alendronate (Fosamax), ibandronate (Boniva), and risedronate (Actonel) can cause severe esophageal erosions in patients who are unable to remain upright at least 30 minutes after taking this medication.   Since brief interruptions in therapy are thought to have minimal impact on bone mineral density, the Pharmacy & Therapeutics Committee has established that bisphosphonate orders should be routinely discontinued during hospitalization.   To override this safety policy and permit administration of Boniva, Fosamax, or Actonel in the hospital, prescribers must write "DO NOT HOLD" in the comments section when placing the order for this class of medications.  Autum Benfer, PharmD., MS Clinical Pharmacist Pager:  [phone] Thank you for allowing pharmacy to be part of this patients care team.  

## 2012-05-12 NOTE — ED Notes (Signed)
Per EMS patient called due to chest pain and SOB.  Stated he woke up SOB wear oxygen at night but does not need it during the day.  Tonight he became very SOB with CP.  EMS adm 4 81mg  ASA and 1 NTG   Presents with SOB  RA oxygen 85%  4liters applied

## 2012-05-12 NOTE — H&P (Signed)
Patient ID: [name] [name] MRN: [id number], DOB/AGE: 1941/01/07   Admit date: 05/12/2012   Primary Physician: Kaleen Mask, MD Primary Cardiologist: Dr Allyson Sabal  HPI:  [name] [name] is a 71 y.o. caucasian male who was previously admitted to [facility] on March 16, 2012 with an inferior wall myocardial infarction. He was taken to the cath lab urgently by Dr. Catalina Gravel through the right radial approach. Dr. Eldridge Dace was unable to revascularization his dominant right coronary artery and the patient ended up evolving a large inferior wall myocardial infarction with complications that include A-fib, COPD exacerbation, and CHF. He was on amiodarone briefly for PAF. He also was hyponatremic during his hospitalization. He had pericardial and pleural effusion during that admission. The pleural effusion required tapping.  He has since stopped smoking. His EF apparently was 30% to 35% prior to discharge and he was sent home on a "LifeVest." He had a repeat echo as an OP on October 29 that revealed improvement in his EF and his "LifeVest" was d/c. [name] [name] returned to the ED today with a complaint of new onset SOB that began 3 days ago. He reports SOB with minimal exertion (getting out of bed, walking around the house). SOB relieved with rest. He also reports chest pain that developed yesterday. Reports pain in upper left torso, radiating to the left axilla. Sharp and constant in nature. No worsening factors. At onset pain was 7/10. The pain was associated with SOB. Pt reports that he became diaphoretic while experiencing the CP and SOB around 3:30 am. He then called his daughter to bring him in to the ED. He was given ASA, NTG and Lasix. Pain was relieved by NTG within 10 minutes. Currently experiencing no pain. He denies palpitations, n/v, orthopnea, PND, and LEE. He denies feeling dizzy and lightheaded. He does however report a 2 lb weight gain over the last 2 days. He reports compliance with all of  his home medications. His daughter reports that he was recently placed on Prednisone 5 mg/daily by his orthopedist for DJD. He started taking the medication 2 days ago.  Problem List: Past Medical History  Diagnosis Date  . Hypertension   . Hernia   . Stroke 2004    minor  . Hyperlipidemia   . Peripheral vascular disease   . Shoulder fracture, left   . COPD (chronic obstructive pulmonary disease)   . At risk for sudden cardiac death, due to [facility] 04/11/12  . CHF (congestive heart failure)   . Cancer     skin cancer  . Heart attack     Past Surgical History  Procedure Date  . Bypass graft 07/2010    axillobifemoral BPG  . Spine surgery 11/23/06    microdiscectomy L5-S1  . Hemorrhoid surgery   . Tonsillectomy   . Hernia repair 02/09/11    Ventral     Allergies:  Allergies  Allergen Reactions  . Ceclor (Cefaclor) Anaphylaxis     Home Medications See Med Rec     Family History  Problem Relation Age of Onset  . Alzheimer's disease Mother   . Cancer Father     prostate  . Cancer Brother     prostate  . Stroke Brother      History   Social History  . Marital Status: Single    Spouse Name: N/A    Number of Children: N/A  . Years of Education: N/A   Occupational History  . Not on file.   Social  History Main Topics  . Smoking status: Former Smoker -- 1.0 packs/day for 50 years    Types: Cigarettes    Quit date: 03/16/2012  . Smokeless tobacco: Never Used  . Alcohol Use: 4.2 oz/week    7 Cans of beer per week     Comment: 2 drinks per day  . Drug Use: No  . Sexually Active: Not on file   Other Topics Concern  . Not on file   Social History Narrative  . No narrative on file     Review of Systems: General: negative for chills, fever, night sweats or weight changes.  Cardiovascular: chest pain as noted positive dyspnea on exertion, no edema, no orthopnea, no palpitations,no paroxysmal nocturnal dyspnea or shortness of breath Dermatological: negative  for rash Respiratory: negative for cough or wheezing Urologic: negative for hematuria Abdominal: negative for nausea, vomiting, diarrhea, bright red blood per rectum, melena, or hematemesis Neurologic: negative for visual changes, syncope, or dizziness All other systems reviewed and are otherwise negative except as noted above.  Physical Exam: Blood pressure 157/83, pulse 66, resp. rate 16, SpO2 97.00%.  General appearance: alert, cooperative and no distress Neck: no carotid bruit and +JVD ~14cmH20 Lungs: few rales Lt base Heart: regular rate and rhythm; N1 S1&2 with + S4, PMI laterally displaced & sustained. Abdomen: soft, non-tender; bowel sounds normal; no masses,  no organomegaly Extremities: extremities normal, atraumatic, no cyanosis or edema Pulses: 2+ and symmetric Skin: cool and dry Neurologic: Grossly normal  Labs:   Results for orders placed during the hospital encounter of 05/12/12 (from the past 24 hour(s))  CBC     Status: Abnormal   Collection Time   05/12/12  5:05 AM      Component Value Range   WBC 11.3 (*) 4.0 - 10.5 K/uL   RBC 4.17 (*) 4.22 - 5.81 MIL/uL   Hemoglobin 12.3 (*) 13.0 - 17.0 g/dL   HCT 09.8 (*) 11.9 - 14.7 %   MCV 87.8  78.0 - 100.0 fL   MCH 29.5  26.0 - 34.0 pg   MCHC 33.6  30.0 - 36.0 g/dL   RDW 82.9  56.2 - 13.0 %   Platelets 327  150 - 400 K/uL  COMPREHENSIVE METABOLIC PANEL     Status: Abnormal   Collection Time   05/12/12  5:05 AM      Component Value Range   Sodium 130 (*) 135 - 145 mEq/L   Potassium 4.5  3.5 - 5.1 mEq/L   Chloride 92 (*) 96 - 112 mEq/L   CO2 27  19 - 32 mEq/L   Glucose, Bld 115 (*) 70 - 99 mg/dL   BUN 17  6 - 23 mg/dL   Creatinine, Ser 8.65  0.50 - 1.35 mg/dL   Calcium 9.3  8.4 - 78.4 mg/dL   Total Protein 7.6  6.0 - 8.3 g/dL   Albumin 3.6  3.5 - 5.2 g/dL   AST 18  0 - 37 U/L   ALT 16  0 - 53 U/L   Alkaline Phosphatase 86  39 - 117 U/L   Total Bilirubin 0.3  0.3 - 1.2 mg/dL   GFR calc non Af Amer 55 (*) >90  mL/min   GFR calc Af Amer 64 (*) >90 mL/min  APTT     Status: Normal   Collection Time   05/12/12  5:05 AM      Component Value Range   aPTT 30  24 - 37 seconds  PROTIME-INR  Status: Normal   Collection Time   05/12/12  5:05 AM      Component Value Range   Prothrombin Time 14.6  11.6 - 15.2 seconds   INR 1.16  0.00 - 1.49  CK TOTAL AND CKMB     Status: Abnormal   Collection Time   05/12/12  5:05 AM      Component Value Range   Total CK 58  7 - 232 U/L   CK, MB 5.0 (*) 0.3 - 4.0 ng/mL   Relative Index RELATIVE INDEX IS INVALID  0.0 - 2.5  POCT I-STAT TROPONIN I     Status: Normal   Collection Time   05/12/12  5:06 AM      Component Value Range   Troponin i, poc 0.04  0.00 - 0.08 ng/mL   Comment 3           URINALYSIS, ROUTINE W REFLEX MICROSCOPIC     Status: Normal   Collection Time   05/12/12  7:10 AM      Component Value Range   Color, Urine YELLOW  YELLOW   APPearance CLEAR  CLEAR   Specific Gravity, Urine 1.008  1.005 - 1.030   pH 7.5  5.0 - 8.0   Glucose, UA NEGATIVE  NEGATIVE mg/dL   Hgb urine dipstick NEGATIVE  NEGATIVE   Bilirubin Urine NEGATIVE  NEGATIVE   Ketones, ur NEGATIVE  NEGATIVE mg/dL   Protein, ur NEGATIVE  NEGATIVE mg/dL   Urobilinogen, UA 0.2  0.0 - 1.0 mg/dL   Nitrite NEGATIVE  NEGATIVE   Leukocytes, UA NEGATIVE  NEGATIVE     Radiology/Studies: Dg Cervical Spine 2-3 Views  04/30/2012  *RADIOLOGY REPORT*  Clinical Data: Neck pain  CERVICAL SPINE - 2-3 VIEW  Comparison: CT scan 06/11/2011  Findings: Five views of the cervical spine submitted.  The study is markedly limited by diffuse osteopenia.  Degenerative changes C1 C2 articulation are stable. No acute fracture or subluxation.  Stable about 3 mm anterolisthesis C4 on C5 vertebral body.  Significant disc space flattening C5-C6 and C6-C7 level again noted.  Stable chronic compression deformity of the C6 vertebral body.  Stable minimal anterior depression of upper endplate of the C5 vertebral body.   No prevertebral soft tissue swelling.  Cervical airway is patent.  IMPRESSION: Markedly limited study as described above.  No acute fracture or subluxation.  Stable degenerative changes as described above.   Original Report Authenticated By: Natasha Mead, M.D.    Dg Chest Port 1 View  05/12/2012  *RADIOLOGY REPORT*  Clinical Data: Dyspnea  PORTABLE CHEST - 1 VIEW  Comparison: 04/05/2012  Findings: Cardiomegaly.  Central vascular congestion.  Increased interstitial prominence and perihilar/infrahilar opacities. Small effusions with associated consolidations suggested. Surgical clips project over the left apex.  Atherosclerotic calcification of the aorta.  Diffuse osteopenia.  IMPRESSION: Pulmonary edema versus multifocal infection.  Small pleural effusions with associated consolidations; atelectasis versus infiltrate.   Original Report Authenticated By: Jearld Lesch, M.D.     EKG:  NSR with inferior TWI, Q in V2, TWI V5-6. Similar to last EKG Oct/2013  ASSESSMENT AND PLAN:  Principal Problem:  *Acute on chronic combined systolic and diastolic congestive heart failure (? 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 Diurese. Consider repeating  echo.  Deland Pretty, PA-C 05/12/2012, 11:07 AM  ATTENDING ATTESTATION:  I have seen and examined the patient along with Corine Shelter, PA.  I have reviewed the chart, notes and new data.  I agree with Luke's findings, examination & recommendations as noted above.  Brief Description: 71 y/o man well known to [facility] after relatively recent hospitalization with subacute Inferior STEMI -  unsuccessful RCA PCI.  He now has Ischemic CM (but reportedly OP Echo revealed improved EF to allow d/c of LifeVest) in addition to severe COPD (CO2 retainer) & PAD.     Key new complaints: admitted after awakening with SOB & CP -- several days after starting Prednisone.  Sx significantly improved after IV diuresis -- just got out of bathroom.  Sx all but resolved.    Key examination changes: JVD & S4 gallop with mild basal rales -- c/w CHF exacerbation (combined A on C S&DHF).  Key new findings / data: check BNP, Initial Troponin Negative. CXR suggests Pulmonary edema.  PLAN:  Agree with admit to r/o MI -- mild troponin could be related to existing RCA occlusion in setting of volume overload.     Check BNP; IV diuresis at least today into tomorrow.  Continue Aldactone (may be able to increase to full 25mg  this admission)  BP is quite elevated - Need to restart BB & Nitrate. Could consider IV NTG gtt for more rapid pre & afterload reduction.  Will add ACE-I for additional afterload reduction.  Had recent Echo, but with recurrent HF & known CAD, agree with re-look echo for new baseline.  Continue ASA/Plavix along with statin & PPI. COPD meds -- Spiriva & prn Albuterol.    Marykay Lex, M.D., M.S. [facility] [address] [place], [place]  [address]  [phone]  05/12/2012 12:32 PM

## 2012-05-12 NOTE — Progress Notes (Signed)
Utilization Review Completed.   Lerlene Treadwell, RN, BSN Nurse Case Manager  [phone]  

## 2012-05-13 LAB — BASIC METABOLIC PANEL
BUN: 21 mg/dL (ref 6–23)
CO2: 29 mEq/L (ref 19–32)
Calcium: 8.8 mg/dL (ref 8.4–10.5)
Chloride: 89 mEq/L — ABNORMAL LOW (ref 96–112)
Creatinine, Ser: 1.58 mg/dL — ABNORMAL HIGH (ref 0.50–1.35)
GFR calc Af Amer: 49 mL/min — ABNORMAL LOW (ref >90)
GFR calc non Af Amer: 42 mL/min — ABNORMAL LOW (ref >90)
Glucose, Bld: 97 mg/dL (ref 70–99)
Potassium: 3.5 mEq/L (ref 3.5–5.1)
Sodium: 129 mEq/L — ABNORMAL LOW (ref 135–145)

## 2012-05-13 LAB — PRO B NATRIURETIC PEPTIDE: Pro B Natriuretic peptide (BNP): 19151 pg/mL — ABNORMAL HIGH (ref 0–125)

## 2012-05-13 LAB — TROPONIN I: Troponin I: <0.3 ng/mL (ref <0.30)

## 2012-05-13 MED ORDER — ENOXAPARIN SODIUM 30 MG/0.3ML ~~LOC~~ SOLN
30.0000 mg | SUBCUTANEOUS | Status: DC
  Administered 2012-05-13 – 2012-05-17 (×5): 30 mg via SUBCUTANEOUS
  Filled 2012-05-13 (×6): qty 0.3

## 2012-05-13 MED ORDER — FUROSEMIDE 40 MG PO TABS
40.0000 mg | ORAL_TABLET | Freq: Two times a day (BID) | ORAL | Status: DC
  Filled 2012-05-13 (×2): qty 1

## 2012-05-13 MED ORDER — SODIUM CHLORIDE 0.9 % IV SOLN: INTRAVENOUS | Status: DC

## 2012-05-13 MED ORDER — LOSARTAN POTASSIUM 25 MG PO TABS
25.0000 mg | ORAL_TABLET | Freq: Every day | ORAL | Status: DC
  Administered 2012-05-14 – 2012-05-18 (×5): 25 mg via ORAL
  Filled 2012-05-13 (×5): qty 1

## 2012-05-13 MED ORDER — SODIUM CHLORIDE 0.9 % IV SOLN
INTRAVENOUS | Status: DC
  Administered 2012-05-13: 15:00:00 via INTRAVENOUS

## 2012-05-13 NOTE — Progress Notes (Addendum)
Subjective:  No complaints. Denies CP. Pt. reports that he went most of the day yesterday w/o supplemental O2 and felt fine. Pt. was still using [place] this am, however, at 2 L/min.   Objective:  Vital Signs in the last 24 hours: Temp:  [97.7 F (36.5 C)-98.6 F (37 C)] 98.6 F (37 C) (11/22 0551) Pulse Rate:  [69-96] 90  (11/22 0551) Resp:  [16-18] 18  (11/22 0551) BP: (133-163)/(57-87) 141/57 mmHg (11/22 0551) SpO2:  [95 %-98 %] 95 % (11/22 0828) Weight:  [54.749 kg (120 lb 11.2 oz)-55.3 kg (121 lb 14.6 oz)] 54.749 kg (120 lb 11.2 oz) (11/22 0551)  Intake/Output from previous day:  Intake/Output Summary (Last 24 hours) at 05/13/12 0939 Last data filed at 05/13/12 0500  Gross per 24 hour  Intake    363 ml  Output    670 ml  Net   -307 ml    Physical Exam: General appearance: alert, cooperative and no distress Lungs: clear to auscultation bilaterally LLB improved from yesterday Heart: regular rate and rhythm, S1, S2 normal, no murmur, click, rub or gallop Extremities: No LEE Pulses: 2+ radials, 1+ DPs Skin: Skin color, texture, turgor normal. No rashes or lesions or warm and dry Neurologic: Grossly normal   Rate: 65-96  Rhythm: normal sinus rhythm  Lab Results:  Basename 05/12/12 0505  WBC 11.3*  HGB 12.3*  PLT 327    Basename 05/13/12 0445 05/12/12 0505  NA 129* 130*  K 3.5 4.5  CL 89* 92*  CO2 29 27  GLUCOSE 97 115*  BUN 21 17  CREATININE 1.58* 1.27    Basename 05/13/12 0445 05/12/12 1806  TROPONINI <0.30 <0.30   Hepatic Function Panel  Basename 05/12/12 0505  PROT 7.6  ALBUMIN 3.6  AST 18  ALT 16  ALKPHOS 86  BILITOT 0.3  BILIDIR --  IBILI --   No results found for this basename: CHOL in the last 72 hours  Basename 05/12/12 0505  INR 1.16    Cardiac Studies:  Assessment/Plan:   Principal Problem:  *Acute on chronic combined systolic and diastolic congestive heart failure Active Problems:  STEMI, unsuccessful RCA PCI 03/17/12  COPD,  moderate  Ischemic cardiomyopathy, EF 30-35% by echo 03/29/12  DJD, 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 HR and BP stable. No I &Os or weights have been recorded. Reassess today to see if Pt has diuresed. Continue with IV Lasix. Last BNP was documented in October of this year and was 08657. Will recheck today.  Cr. is slightly elevated at 1.58. Continue to monitor renal function. Titrate down O2 and ambulate today.  Corine Shelter PA-C 05/13/2012, 9:39 AM  I seen and evaluated the patient this morning along with  Corine Shelter, PA. I agree with his findings, examination as well as impression recommendations.  The patient feels great -- BP is a bit low when I am seeing him, but is off of O2 & ambulated briskly with me in the hall with no CP or SOB.  Echo just read -- EF 35-40% noted, but moderate pericardial effusion with mid RV collapse noted c/w increase pericardial pressures (read as "tamponade physiology"; however, the IVC is small and completely collapses.  He is not tachycardic & clinically improved.  I am not inclined to pursue pericardiocentesis.  I will d/c diuretic & hydrate with the small collapsing IVC.    I suspect that his CP may well be Dressler's Pericarditis type pain, but it is no longer present.  At this point, with increased Cr, I am not inclined to use NSAIDS as he is not having any more pain.  Will dose ARB at qhs as opposed to AM to avoid hypotension.  His elevated BP on admission also argues against cardiac tamponade.  I think we should monitor him at least another night - need to follow renal Fxn & Sx.  If Sx of tamponade, would consider CT consult as the effusion does not look very easily accessible percutaneously.  Marykay Lex, M.D., M.S. [facility] [address] [place], [place]  [address]  [phone] Pager # [phone] 05/13/2012 2:54 PM

## 2012-05-13 NOTE — Progress Notes (Signed)
Pt BP 89/44 with pulse of 78.  Pt denies any lightheadness or dizziness.  MD has been made aware.  New orders given.  Will continue to monitor. Nino Glow RN

## 2012-05-14 MED ORDER — FUROSEMIDE 10 MG/ML IJ SOLN
40.0000 mg | Freq: Once | INTRAMUSCULAR | Status: AC
  Administered 2012-05-14: 40 mg via INTRAVENOUS
  Filled 2012-05-14: qty 4

## 2012-05-14 MED ORDER — FUROSEMIDE 40 MG PO TABS
40.0000 mg | ORAL_TABLET | Freq: Every day | ORAL | Status: DC
  Administered 2012-05-15 – 2012-05-16 (×2): 40 mg via ORAL
  Filled 2012-05-14 (×3): qty 1

## 2012-05-14 NOTE — Progress Notes (Signed)
Subjective:   Objective: Vital signs in last 24 hours: Temp:  [97.7 F (36.5 C)-98.7 F (37.1 C)] 97.7 F (36.5 C) (11/23 0456) Pulse Rate:  [59-78] 59  (11/23 0456) Resp:  [18-19] 18  (11/23 0456) BP: (89-152)/(44-89) 152/89 mmHg (11/23 0456) SpO2:  [94 %-97 %] 95 % (11/23 0456) Weight:  [56.155 kg (123 lb 12.8 oz)] 56.155 kg (123 lb 12.8 oz) (11/23 0456) Weight change: 0.855 kg (1 lb 14.2 oz) Last BM Date: 05/12/12 Intake/Output from previous day: +1037 11/22 0701 - 11/23 0700 In: 1437.9 [P.O.:940; I.V.:497.9] Out: 400 [Urine:400] Intake/Output this shift:    PE: General:alert and oriented Heart:S1S2 RRR Lungs:clear Abd:+ BS soft non tender Ext:no edema    Lab Results:  Basename 05/12/12 0505  WBC 11.3*  HGB 12.3*  HCT 36.6*  PLT 327   BMET  Basename 05/13/12 0445 05/12/12 0505  NA 129* 130*  K 3.5 4.5  CL 89* 92*  CO2 29 27  GLUCOSE 97 115*  BUN 21 17  CREATININE 1.58* 1.27  CALCIUM 8.8 9.3    Basename 05/13/12 0445 05/12/12 1806  TROPONINI <0.30 <0.30    Lab Results  Component Value Date   CHOL 103 03/30/2012   HDL 48 03/17/2012   LDLCALC 58 03/17/2012   TRIG 85 03/17/2012   CHOLHDL 2.6 03/17/2012   Lab Results  Component Value Date   HGBA1C 5.4 03/17/2012     Lab Results  Component Value Date   TSH 2.506 03/25/2012    Hepatic Function Panel  Basename 05/12/12 0505  PROT 7.6  ALBUMIN 3.6  AST 18  ALT 16  ALKPHOS 86  BILITOT 0.3  BILIDIR --  IBILI --  Studies/Results: No results found.  Medications: I have reviewed the patient's current medications.    Marland Kitchen atorvastatin  20 mg Oral Daily  . clopidogrel  75 mg Oral Daily  . enoxaparin (LOVENOX) injection  30 mg Subcutaneous Q24H  . gabapentin  600 mg Oral BID  . isosorbide mononitrate  30 mg Oral Daily  . losartan  25 mg Oral Daily  . metoprolol succinate  12.5 mg Oral Daily  . pantoprazole  40 mg Oral Q1200  . sodium chloride  3 mL Intravenous Q12H  . spironolactone  12.5 mg  Oral BID  . tiotropium  18 mcg Inhalation Daily  . [DISCONTINUED] furosemide  40 mg Intravenous BID  . [DISCONTINUED] furosemide  40 mg Oral BID  . 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 Elevated  Na 129, K+ 3.5 will add K+ pro bnp 19,151--at discharge in 03/2012 it was 9,640 See Dr. Hazle Coca note.  LOS: 2 days   [name] [name],[name] [name] 05/14/2012, 10:14 AM   Agree with note written by Nada Boozer RNP  Admitted with SOB. Pt had STEMI 2 months ago with failed RCA PCI and mod LV dysfunction. He has diffuse PVOD. He had a 2D in Oct that showed EF 30-35% with a moderate  Peric effusion. On this echo he has a moderate sized anterior pericardial effusion with mild RV collapse but no dilated IVC. Dr. Herbie Baltimore did not feel it was percutaneously drain able. Currently he denies CP/SOB. No rub on exam. Lungs clear, no increased JVD, tachy or periph edema. Scr increased as well. CXR shows interstitial edema and ProBNP increased to 19 K (was 9K at D/C. At this point he is not clinically in tamponade. Will restart po lasix, follow BMET and BNP. May be ready to go home in next 24-48 hours with close OP follow up.  Runell Gess 05/14/2012 10:19 AM

## 2012-05-15 ENCOUNTER — Inpatient Hospital Stay (HOSPITAL_COMMUNITY): Payer: Medicare Other

## 2012-05-15 LAB — BASIC METABOLIC PANEL
BUN: 22 mg/dL (ref 6–23)
GFR calc Af Amer: 58 mL/min — ABNORMAL LOW (ref >90)
GFR calc non Af Amer: 50 mL/min — ABNORMAL LOW (ref >90)
Potassium: 3.8 mEq/L (ref 3.5–5.1)
Sodium: 131 mEq/L — ABNORMAL LOW (ref 135–145)

## 2012-05-15 LAB — PRO B NATRIURETIC PEPTIDE: Pro B Natriuretic peptide (BNP): 13838 pg/mL — ABNORMAL HIGH (ref 0–125)

## 2012-05-15 LAB — CBC
Hemoglobin: 12.8 g/dL — ABNORMAL LOW (ref 13.0–17.0)
MCHC: 34.4 g/dL (ref 30.0–36.0)
RBC: 4.27 MIL/uL (ref 4.22–5.81)
WBC: 11 10*3/uL — ABNORMAL HIGH (ref 4.0–10.5)

## 2012-05-15 NOTE — Progress Notes (Signed)
[facility] Progress Note  Subjective:  No sob or chest pain.  Objective:   Vital Signs in the last 24 hours: Temp:  [97.7 F (36.5 C)-98.2 F (36.8 C)] 98.2 F (36.8 C) (11/24 0503) Pulse Rate:  [59-71] 71  (11/24 0503) Resp:  [18-20] 18  (11/24 0503) BP: (135-163)/(65-77) 135/75 mmHg (11/24 0503) SpO2:  [93 %-99 %] 93 % (11/24 0739) Weight:  [55.974 kg (123 lb 6.4 oz)] 55.974 kg (123 lb 6.4 oz) (11/24 0503)  Intake/Output from previous day: 11/23 0701 - 11/24 0700 In: 900 [P.O.:900] Out: 1950 [Urine:1950]  Scheduled:   . atorvastatin  20 mg Oral Daily  . clopidogrel  75 mg Oral Daily  . enoxaparin (LOVENOX) injection  30 mg Subcutaneous Q24H  . [COMPLETED] furosemide  40 mg Intravenous Once  . furosemide  40 mg Oral Daily  . gabapentin  600 mg Oral BID  . isosorbide mononitrate  30 mg Oral Daily  . losartan  25 mg Oral Daily  . metoprolol succinate  12.5 mg Oral Daily  . pantoprazole  40 mg Oral Q1200  . sodium chloride  3 mL Intravenous Q12H  . spironolactone  12.5 mg Oral BID  . tiotropium  18 mcg Inhalation Daily    Physical Exam:   General appearance: alert, cooperative and no distress Neck: no JVD, supple, symmetrical, trachea midline and thyroid not enlarged, symmetric, no tenderness/mass/nodules Lungs: decreased at bases Heart: regular rate and rhythm and no rub Abdomen: soft, non-tender; bowel sounds normal; no masses,  no organomegaly Extremities: no edema, redness or tenderness in the calves or thighs   Rate: 70  Rhythm: normal sinus rhythm  Lab Results:    Basename 05/15/12 0720 05/13/12 0445  NA 131* 129*  K 3.8 3.5  CL 92* 89*  CO2 29 29  GLUCOSE 99 97  BUN 22 21  CREATININE 1.37* 1.58*    Basename 05/13/12 0445 05/12/12 1806  TROPONINI <0.30 <0.30   Hepatic Function Panel No results found for this basename: PROT,ALBUMIN,AST,ALT,ALKPHOS,BILITOT,BILIDIR,IBILI in the last 72 hours No results found for  this basename: INR in the last 72 hours  Lipid Panel     Component Value Date/Time   CHOL 103 03/30/2012 1416   TRIG 85 03/17/2012 0530   HDL 48 03/17/2012 0530   CHOLHDL 2.6 03/17/2012 0530   VLDL 17 03/17/2012 0530   LDLCALC 58 03/17/2012 0530    BNP (last 3 results)  Basename 05/15/12 0720 05/13/12 1205 04/05/12 0636  PROBNP 13838.0* 19151.0* 9640.0*   Imaging:  No results found.    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  Will continue diuresis. F/U CXR; increase losartan to 25 mg bid.    Lennette Bihari, MD, [facility] 05/15/2012, 10:12 AM

## 2012-05-16 LAB — BASIC METABOLIC PANEL
CO2: 29 mEq/L (ref 19–32)
Calcium: 8.9 mg/dL (ref 8.4–10.5)
Potassium: 4.1 mEq/L (ref 3.5–5.1)
Sodium: 130 mEq/L — ABNORMAL LOW (ref 135–145)

## 2012-05-16 LAB — PRO B NATRIURETIC PEPTIDE: Pro B Natriuretic peptide (BNP): 11641 pg/mL — ABNORMAL HIGH (ref 0–125)

## 2012-05-16 NOTE — Progress Notes (Signed)
Subjective:  No SOB, he wants to go home.  Objective:  Vital Signs in the last 24 hours: Temp:  [97.7 F (36.5 C)-98.8 F (37.1 C)] 97.7 F (36.5 C) (11/25 0439) Pulse Rate:  [68-84] 68  (11/25 0927) Resp:  [18-20] 20  (11/25 0439) BP: (135-154)/(64-76) 148/71 mmHg (11/25 0927) SpO2:  [95 %-97 %] 96 % (11/25 0909) Weight:  [56.7 kg (125 lb)] 56.7 kg (125 lb) (11/25 0439)  Intake/Output from previous day:  Intake/Output Summary (Last 24 hours) at 05/16/12 1049 Last data filed at 05/16/12 1027  Gross per 24 hour  Intake   1140 ml  Output   2800 ml  Net  -1660 ml    Physical Exam: General appearance: alert, cooperative and no distress Lungs: clear to auscultation bilaterally Heart: regular rate and rhythm   Rate: 70  Rhythm: normal sinus rhythm  Lab Results:  Basename 05/15/12 0720  WBC 11.0*  HGB 12.8*  PLT 294    Basename 05/16/12 0500 05/15/12 0720  NA 130* 131*  K 4.1 3.8  CL 93* 92*  CO2 29 29  GLUCOSE 91 99  BUN 23 22  CREATININE 1.45* 1.37*   No results found for this basename: TROPONINI:2,CK,MB:2 in the last 72 hours Hepatic Function Panel No results found for this basename: PROT,ALBUMIN,AST,ALT,ALKPHOS,BILITOT,BILIDIR,IBILI in the last 72 hours No results found for this basename: CHOL in the last 72 hours No results found for this basename: INR in the last 72 hours  Imaging: Imaging results have been reviewed  Cardiac Studies:  Assessment/Plan:   Principal Problem:  *Acute on chronic combined systolic and diastolic congestive heart failure Active Problems:  STEMI, unsuccessful RCA PCI 03/17/12  COPD, moderate  Ischemic cardiomyopathy, EF 30-35% by echo 03/29/12  Pericardial effusion, moderate on echo 03/29/12, continues to be moderate this admit  DJD, 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 Moderate pericardial effusion with tamponade physiology by echo. His BNP is still > 11K. He looks comfortable. He is diuresing on Lasix 40mg  po. We will need to follow his renal function closely as he is on an ARB now, (SCr 1.5).     Corine Shelter PA-C 05/16/2012, 10:49 AM

## 2012-05-16 NOTE — Progress Notes (Signed)
Pt. Seen and examined. Agree with the NP/PA-C note as written.  Continues to diurese well, however, creatinine rising. BNP is coming down (19151 -> 11641).  Would continue diuretics. Consider re-check limited echocardiogram prior to discharge to look for interval change in the size of his effusion.  Not ready for discharge.  Chrystie Nose, MD, [facility] Attending Cardiologist [facility]

## 2012-05-17 MED ORDER — FUROSEMIDE 10 MG/ML IJ SOLN
40.0000 mg | Freq: Once | INTRAMUSCULAR | Status: AC
  Administered 2012-05-17: 40 mg via INTRAVENOUS
  Filled 2012-05-17: qty 4

## 2012-05-17 MED ORDER — FUROSEMIDE 40 MG PO TABS
40.0000 mg | ORAL_TABLET | Freq: Every day | ORAL | Status: DC
  Filled 2012-05-17: qty 1

## 2012-05-17 NOTE — Plan of Care (Signed)
Problem: Phase II Progression Outcomes Goal: Fluid volume status improved Outcome: Progressing IV dose Lasix given x 1 today.

## 2012-05-17 NOTE — Progress Notes (Signed)
Utilization review completed.  

## 2012-05-17 NOTE — Progress Notes (Signed)
Subjective:  No SOB  Objective:  Vital Signs in the last 24 hours: Temp:  [97.3 F (36.3 C)-97.9 F (36.6 C)] 97.8 F (36.6 C) (11/26 0544) Pulse Rate:  [62-72] 68  (11/26 0544) Resp:  [17-19] 17  (11/26 0544) BP: (141-158)/(62-89) 141/89 mmHg (11/26 0544) SpO2:  [96 %-98 %] 96 % (11/26 0544) Weight:  [56.518 kg (124 lb 9.6 oz)] 56.518 kg (124 lb 9.6 oz) (11/26 0544)  Intake/Output from previous day:  Intake/Output Summary (Last 24 hours) at 05/17/12 0855 Last data filed at 05/17/12 6295  Gross per 24 hour  Intake    723 ml  Output   1625 ml  Net   -902 ml    Physical Exam: General appearance: alert, cooperative and no distress Lungs: clear to auscultation bilaterally Heart: regular rate and rhythm   Rate: 70  Rhythm: normal sinus rhythm  Lab Results:  Basename 05/15/12 0720  WBC 11.0*  HGB 12.8*  PLT 294    Basename 05/16/12 0500 05/15/12 0720  NA 130* 131*  K 4.1 3.8  CL 93* 92*  CO2 29 29  GLUCOSE 91 99  BUN 23 22  CREATININE 1.45* 1.37*   No results found for this basename: TROPONINI:2,CK,MB:2 in the last 72 hours Hepatic Function Panel No results found for this basename: PROT,ALBUMIN,AST,ALT,ALKPHOS,BILITOT,BILIDIR,IBILI in the last 72 hours No results found for this basename: CHOL in the last 72 hours No results found for this basename: INR in the last 72 hours  Imaging: Imaging results have been reviewed  Cardiac Studies:  Assessment/Plan:   Principal Problem:  *Acute on chronic combined systolic and diastolic congestive heart failure Active Problems:  STEMI, unsuccessful RCA PCI 03/17/12  COPD, moderate  Ischemic cardiomyopathy, EF 30-35% by echo 03/29/12  Pericardial effusion, moderate on echo 03/29/12, continues to be moderate this admit  DJD, recently put on steroids as an OP  Tobacco abuse, pt quit after MI  PVD, Lt Ax-fem and fem-fem BPG 2/12, Dr Manson Passey follows  HTN (hypertension)  Dyslipidemia  History of CVA (cerebrovascular  accident)  PAF, post MI 9/13  Plan- Per Dr Rennis Golden - keep pushing diuresis, BNP was > 10K yesterday. Will give Lasix IV today.  Follow up echo for pericardial effusion tomorrow and check BNP. If all improved consider discharge.  Corine Shelter PA-C 05/17/2012, 8:55 AM  I have seen and examined the patient along with Corine Shelter PA-C.  I have reviewed the chart, notes and new data.  I agree with PA's note.  Key new complaints: feels well Key examination changes: JVP pretty flat. No clinical tamponade. Key new findings / data: BNP still high, baseline seems to be 5000-6000.  PLAN: Optimal volemic status to be determined. 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 Repeat echo tomorrow.  Thurmon Fair, MD, [facility] [phone] 05/17/2012, 10:14 AM

## 2012-05-18 LAB — BASIC METABOLIC PANEL
BUN: 29 mg/dL — ABNORMAL HIGH (ref 6–23)
CO2: 29 mEq/L (ref 19–32)
Calcium: 8.9 mg/dL (ref 8.4–10.5)
Chloride: 94 mEq/L — ABNORMAL LOW (ref 96–112)
Creatinine, Ser: 1.75 mg/dL — ABNORMAL HIGH (ref 0.50–1.35)
GFR calc Af Amer: 43 mL/min — ABNORMAL LOW (ref >90)
GFR calc non Af Amer: 37 mL/min — ABNORMAL LOW (ref >90)
Glucose, Bld: 86 mg/dL (ref 70–99)
Potassium: 3.9 mEq/L (ref 3.5–5.1)
Sodium: 132 mEq/L — ABNORMAL LOW (ref 135–145)

## 2012-05-18 LAB — PRO B NATRIURETIC PEPTIDE: Pro B Natriuretic peptide (BNP): 8172 pg/mL — ABNORMAL HIGH (ref 0–125)

## 2012-05-18 MED ORDER — LOSARTAN POTASSIUM 25 MG PO TABS: 25.0000 mg | ORAL_TABLET | Freq: Every day | ORAL | Status: DC

## 2012-05-18 NOTE — Progress Notes (Signed)
[facility]  Patient Status: Active (receiving services up to time of hospitalization)  [facility] is providing the following services: RN  If patient discharges after hours, please call [phone].   [name] [name] 05/18/2012, 12:00 PM

## 2012-05-18 NOTE — Discharge Summary (Signed)
Physician Discharge Summary  Patient ID: [name] [name] MRN: [id number] DOB/AGE: 20-Oct-1940 71 y.o.  Admit date: 05/12/2012 Discharge date: 05/18/2012  Admission Diagnoses:   Acute on chronic combined systolic and diastolic congestive heart failure   Discharge Diagnoses:  Principal Problem:  *Acute on chronic combined systolic and diastolic congestive heart failure Active Problems:  Tobacco abuse, pt quit after MI  STEMI, unsuccessful RCA PCI 03/17/12  COPD, moderate  PVD, Lt Ax-fem and fem-fem BPG 2/12, Dr Manson Passey follows  HTN (hypertension)  Dyslipidemia  History of CVA (cerebrovascular accident)  PAF, post MI 9/13  Ischemic cardiomyopathy, EF 30-35% by echo 03/29/12  Pericardial effusion, moderate on echo 03/29/12, continues to be moderate this admit  DJD, recently put on steroids as an OP   Discharged Condition: stable  Hospital Course:   [name] [name] is a 71 y.o. caucasian male who was previously admitted to [facility] on March 16, 2012 with an inferior wall myocardial infarction. He was taken to the cath lab urgently by Dr. Catalina Gravel through the right radial approach. Dr. Eldridge Dace was unable to revascularization his dominant right coronary artery and the patient ended up evolving a large inferior wall myocardial infarction with complications that include A-fib and CHF. He was on amiodarone briefly and diuresed. He also was hyponatremic during his hospitalization. His other problems include COPD, though, he stopped smoking. He did have a pleural effusion requiring thoracentesis. His EF apparently was 30% to 35% prior to discharge and he was sent home on a "LifeVest." He had a repeat echo as an OP on October 29 that revealed and EF of .... And his "LifeVest" was d/c.   [name] [name] returned to the ED today with a complaint of new onset SOB that began 3 days ago. He reported SOB with minimal exertion (getting out of bed, walking around the house). SOB relieved with rest. He  also reports chest pain. Reported pain in upper left torso, radiating to the left axilla. Sharp and constant in nature. No worsening factors. At onset, pain was 7/10. The pain was associated with SOB. Pt reported that he became diaphoretic while experiencing the CP and SOB around 3:30 am. He then called his daughter to bring him in to the ED. He was given ASA, NTG and Lasix. Pain was relieved by NTG within 10 minutes. He denied palpitations, n/v, orthopnea, PND, and LEE. He denied feeling dizzy and lightheaded. He does however, report a 2 lb weight gain over the last 2 days. He reports compliance with all of his home medications.   He was patient was admitted to rule out myocardial infarction. 2 start an IV diuretics. He started beta blocker and nitrates. He was started on ACE inhibitor.  Troponin was negative. BNP was elevated at 19,151 in discharge had decreased to 8172.  He had approximately 2.8 L net negative fluid output.  Serum creatinine had increased slightly. We'll check pro BNP and basic metabolic panel as an outpatient.  2-D echocardiogram showed an ejection fraction of 35-40% akinesis of the entire inferior myocardium. Also reveals a moderate pericardial effusion anterior to the heart.  Repeat echo on November 27 confirmed moderate partially loculated pericardial effusion, with no evidence of hemodynamic compromise.  The patient was seen by Dr. Herbie Baltimore feels he stable for discharge home with followup at the [facility] vascular [facility] within 7 days.  Consults: None  Significant Diagnostic Studies:  2-D echocardiogram, Study Conclusions  - Left ventricle: The cavity size was normal. Systolic  function was moderately reduced. The estimated ejection fraction was in the range of 35% to 40%. Akinesis of the entireinferior myocardium; consistent with infarction in the distribution of the right coronary artery. - Aortic valve: Mild regurgitation. - Pericardium, extracardiac: A moderate  pericardial effusion was identified anterior to the heart. The fluid had no internal echoes. There was mildright ventricular chamber collapse for more than 50% of the cardiac cycle. Features were consistent with tamponade physiology.    CHEST - 2 VIEW  Comparison: 05/12/2012 and earlier.  Findings: Small bilateral pleural effusions not significantly  changed since October. Stable cardiac size and mediastinal  contours. Visualized tracheal air column is within normal limits.  Apical vascular or pleural calcification. Decreased interstitial  markings. No pulmonary edema. Advanced calcified atherosclerosis  of the aorta. Stable severe mid thoracic compression fracture. No  acute pulmonary opacity.  IMPRESSION:  Stable small pleural effusions. No new cardiopulmonary  abnormality.  BMET    Component Value Date/Time   NA 132* 05/18/2012 0510   K 3.9 05/18/2012 0510   CL 94* 05/18/2012 0510   CO2 29 05/18/2012 0510   GLUCOSE 86 05/18/2012 0510   BUN 29* 05/18/2012 0510   CREATININE 1.75* 05/18/2012 0510   CALCIUM 8.9 05/18/2012 0510   GFRNONAA 37* 05/18/2012 0510   GFRAA 43* 05/18/2012 0510   CBC    Component Value Date/Time   WBC 11.0* 05/15/2012 0720   RBC 4.27 05/15/2012 0720   HGB 12.8* 05/15/2012 0720   HCT 37.2* 05/15/2012 0720   PLT 294 05/15/2012 0720   MCV 87.1 05/15/2012 0720   MCH 30.0 05/15/2012 0720   MCHC 34.4 05/15/2012 0720   RDW 13.6 05/15/2012 0720   LYMPHSABS 0.9 03/16/2012 0937   MONOABS 2.2* 03/16/2012 0937   EOSABS 0.0 03/16/2012 0937   BASOSABS 0.0 03/16/2012 0937   Treatments: See above  Discharge Exam: Blood pressure 111/65, pulse 66, temperature 97.8 F (36.6 C), temperature source Oral, resp. rate 20, height 5\' 8"  (1.727 m), weight 55.7 kg (122 lb 12.7 oz), SpO2 98.00%.   Disposition: 01-Home or Self Care  Discharge Orders    Future Appointments: Provider: Department: Dept Phone: Center:   05/26/2012 10:30 AM [facility] [place]  [facility] [address] [phone] None   05/03/2013 11:00 AM [facility] [phone] [facility]   05/03/2013 11:40 AM Evern Bio, NP [facility] [phone] [facility]     Future Orders Please Complete By Expires   Diet - low sodium heart healthy      Increase activity slowly      Discharge instructions      Comments:   Daily weights. Low sodium diet.       Medication List     As of 05/18/2012  2:41 PM    STOP taking these medications         lidocaine 5 %   Commonly known as: LIDODERM      spironolactone 25 MG tablet   Commonly known as: ALDACTONE      TAKE these medications         acetaminophen 325 MG tablet   Commonly known as: TYLENOL   Take 2 tablets (650 mg total) by mouth every 4 (four) hours as needed.      albuterol (5 MG/ML) 0.5% nebulizer solution   Commonly known as: PROVENTIL   Take 0.5 mLs (2.5 mg total) by nebulization every 4 (four) hours as needed for wheezing  or shortness of breath.      aspirin 81 MG chewable tablet   Chew 1 tablet (81 mg total) by mouth daily.      atorvastatin 20 MG tablet   Commonly known as: LIPITOR   Take 20 mg by mouth daily.      clopidogrel 75 MG tablet   Commonly known as: PLAVIX   Take 75 mg by mouth daily.      furosemide 40 MG tablet   Commonly known as: LASIX   Take 1 tablet (40 mg total) by mouth daily.      gabapentin 600 MG tablet   Commonly known as: NEURONTIN   Take 600 mg by mouth 2 (two) times daily.      guaiFENesin-dextromethorphan 100-10 MG/5ML syrup   Commonly known as: ROBITUSSIN DM   Take 5 mLs by mouth every 4 (four) hours as needed for cough.      isosorbide mononitrate 30 MG 24 hr tablet   Commonly known as: IMDUR   Take 1 tablet (30 mg total) by mouth daily.      losartan 25 MG tablet   Commonly known as: COZAAR   Take 1 tablet (25 mg total) by mouth daily.      metoprolol succinate 25 MG 24 hr tablet     Commonly known as: TOPROL-XL   Take 0.5 tablets (12.5 mg total) by mouth daily.      nicotine 14 mg/24hr patch   Commonly known as: NICODERM CQ - dosed in mg/24 hours   Place 1 patch onto the skin daily.      nitroGLYCERIN 0.4 MG SL tablet   Commonly known as: NITROSTAT   Place 1 tablet (0.4 mg total) under the tongue every 5 (five) minutes x 3 doses as needed for chest pain.      oxyCODONE-acetaminophen 10-650 MG per tablet   Commonly known as: PERCOCET   Take 1 tablet by mouth every 4 (four) hours as needed. For pain      pantoprazole 40 MG tablet   Commonly known as: PROTONIX   Take 1 tablet (40 mg total) by mouth daily at 12 noon.      risedronate 35 MG tablet   Commonly known as: ACTONEL   Take 35 mg by mouth every 7 (seven) days. Take on Wednesdays.  Take with water on empty stomach, nothing by mouth or lie down for next 30 minutes.      tiotropium 18 MCG inhalation capsule   Commonly known as: SPIRIVA   Place 1 capsule (18 mcg total) into inhaler and inhale daily.      traMADol 50 MG tablet   Commonly known as: ULTRAM   Take 1 tablet (50 mg total) by mouth every 6 (six) hours as needed.         SignedWilburt Finlay 05/18/2012, 2:41 PM   I saw and evaluated the patient on the day of d/c along with the [name] [name], [place]. I agree with his summary of the hospitalization.  I have reviewed the Echocardiogram from this AM -- essentially no significant change besides less evidence of RV "collapse". No hemodynamic compromise. IVC remains small/collapsed with > 50% respiro-phasic collapse. EF 35-40% (with expected Inferior HK)  LV Filling pressures are normal, indicating adequate diuresis. I agree with holding lasix today.  He remains stable, but Cr is up a bit -- probably a bit "over-diuresed". BNP is likely going to remain elevated for some time now, but has dropped.  I do  not think he needs additional diuresis. PO diuretic restart tomorrow - but hold Aldactone until BMP  check .  Agree with d/c home today with BNP/BMP check early next week & close fu clinic visit.  On BB,ARB, Nitrate with stable BP/HR. Plavix & statin.  Restart diuretics as described above.  Marykay Lex, M.D., M.S. [facility] [address] [place], [place]  [address]  [phone] Pager # [phone] 05/19/2012 11:10 AM

## 2012-05-18 NOTE — Progress Notes (Signed)
  Echocardiogram 2D Echocardiogram limited has been performed.  Cathie Beams 05/18/2012, 9:28 AM

## 2012-05-18 NOTE — Progress Notes (Signed)
Patient's Lasix has been held this am per PA.  Lorretta Harp RN

## 2012-05-18 NOTE — Progress Notes (Signed)
[facility]  Subjective: No CP or SOB.  Objective: Vital signs in last 24 hours: Temp:  [97.1 F (36.2 C)-98.4 F (36.9 C)] 98.3 F (36.8 C) (11/27 0602) Pulse Rate:  [57-77] 57  (11/27 0602) Resp:  [19-20] 20  (11/27 0602) BP: (110-135)/(44-69) 135/44 mmHg (11/27 0602) SpO2:  [94 %-99 %] 99 % (11/27 0602) Weight:  [55.7 kg (122 lb 12.7 oz)] 55.7 kg (122 lb 12.7 oz) (11/27 0602) Last BM Date: 05/17/12  Intake/Output from previous day: 11/26 0701 - 11/27 0700 In: 1027 [P.O.:1020; I.V.:3; IV Piggyback:4] Out: 1675 [Urine:1675] Intake/Output this shift: Total I/O In: 240 [P.O.:240] Out: -   Medications Current Facility-Administered Medications  Medication Dose Route Frequency Provider Last Rate Last Dose  . 0.9 %  sodium chloride infusion  250 mL Intravenous PRN Abelino Derrick, PA      . 0.9 %  sodium chloride infusion   Intravenous Continuous Nada Boozer, NP      . acetaminophen (TYLENOL) tablet 650 mg  650 mg Oral Q4H PRN Abelino Derrick, PA      . albuterol (PROVENTIL) (5 MG/ML) 0.5% nebulizer solution 2.5 mg  2.5 mg Nebulization Q4H PRN Abelino Derrick, PA      . atorvastatin (LIPITOR) tablet 20 mg  20 mg Oral Daily Eda Paschal [place], [place]   20 mg at 05/17/12 1811  . clopidogrel (PLAVIX) tablet 75 mg  75 mg Oral Daily Eda Paschal [place], [place]   75 mg at 05/17/12 0930  . enoxaparin (LOVENOX) injection 30 mg  30 mg Subcutaneous Q24H Severiano Gilbert, PHARMD   30 mg at 05/17/12 2125  . furosemide (LASIX) tablet 40 mg  40 mg Oral Daily Eda Paschal [place], [place]      . gabapentin (NEURONTIN) tablet 600 mg  600 mg Oral BID Eda Paschal [place], PA   600 mg at 05/17/12 2124  . guaiFENesin-dextromethorphan (ROBITUSSIN DM) 100-10 MG/5ML syrup 5 mL  5 mL Oral Q4H PRN Abelino Derrick, PA      . isosorbide mononitrate (IMDUR) 24 hr tablet 30 mg  30 mg Oral Daily Eda Paschal [place], [place]   30 mg at 05/17/12 0933  . losartan (COZAAR) tablet 25 mg  25 mg Oral Daily Marykay Lex, MD   25 mg at  05/17/12 0933  . metoprolol succinate (TOPROL-XL) 24 hr tablet 12.5 mg  12.5 mg Oral Daily Eda Paschal [place], PA   12.5 mg at 05/17/12 1032  . nitroGLYCERIN (NITROSTAT) SL tablet 0.4 mg  0.4 mg Sublingual Q5 Min x 3 PRN Abelino Derrick, [place]      . oxyCODONE (Oxy IR/ROXICODONE) immediate release tablet 5 mg  5 mg Oral Q4H PRN Marykay Lex, MD   5 mg at 05/17/12 2125   And  . oxyCODONE-acetaminophen (PERCOCET/ROXICET) 5-325 MG per tablet 1 tablet  1 tablet Oral Q6H PRN Marykay Lex, MD   1 tablet at 05/17/12 2124  . pantoprazole (PROTONIX) EC tablet 40 mg  40 mg Oral Q1200 Abelino Derrick, PA   40 mg at 05/17/12 1111  . sodium chloride 0.9 % injection 3 mL  3 mL Intravenous Q12H Eda Paschal [place], [place]   3 mL at 05/17/12 2125  . sodium chloride 0.9 % injection 3 mL  3 mL Intravenous PRN Abelino Derrick, PA      . spironolactone (ALDACTONE) tablet 12.5 mg  12.5 mg Oral BID Abelino Derrick, [place]   12.5  mg at 05/17/12 1811  . tiotropium (SPIRIVA) inhalation capsule 18 mcg  18 mcg Inhalation Daily Eda Paschal [place], [place]   18 mcg at 05/18/12 0815  . traMADol (ULTRAM) tablet 50 mg  50 mg Oral Q6H PRN Abelino Derrick, PA        PE: General appearance: alert, cooperative and no distress Lungs: clear to auscultation bilaterally Heart: regular rate and rhythm, S1, S2 normal, no murmur, click, rub or gallop Extremities: No LEE Pulses: 2+ right radial.  1+ Left radial  0- PTs and DPs.  [place] extremities are warm. Skin: warm and dry Neurologic: Grossly normal  Lab Results:  No results found for this basename: WBC:3,HGB:3,HCT:3,PLT:3 in the last 72 hours BMET  Basename 05/18/12 0510 05/16/12 0500  NA 132* 130*  K 3.9 4.1  CL 94* 93*  CO2 29 29  GLUCOSE 86 91  BUN 29* 23  CREATININE 1.75* 1.45*  CALCIUM 8.9 8.9   Assessment/Plan  Principal Problem:  *Acute on chronic combined systolic and diastolic congestive heart failure Active Problems:  Tobacco abuse, pt quit after MI  STEMI, unsuccessful RCA PCI 03/17/12   COPD, moderate  PVD, Lt Ax-fem and fem-fem BPG 2/12, Dr Manson Passey follows  HTN (hypertension)  Dyslipidemia  History of CVA (cerebrovascular accident)  PAF, post MI 9/13  Ischemic cardiomyopathy, EF 30-35% by echo 03/29/12  Pericardial effusion, moderate on echo 03/29/12, continues to be moderate this admit  DJD, recently put on steroids as an OP  Plan:  Limited Echo completed today.  Net fluids in the last 24 hours: -1.5L.    BNP 8172.  Down from 19k.  SCr increased.  Hold Lasix today.   IF echo ok, DC home with OP BMET and 7 day follow up.    LOS: 6 days    Seger Jani 05/18/2012 10:00 AM

## 2012-05-18 NOTE — Progress Notes (Signed)
I seen and evaluated the patient this PM along with the Mr. Leron Croak, [place]. I agree with his findings, examination as well as impression recommendations.  I have reviewed the Echocardiogram from this AM -- essentially no significant change besides less evidence of RV "collapse".  No hemodynamic compromise.  IVC remains small/collapsed with > 50% respiro-phasic collapse. EF 35-40% (with expected Inferior HK)  LV Filling pressures are normal, indicating adequate diuresis.  I agree with holding lasix today.   He remains stable, but Cr is up a bit -- probably a bit "over-diuresed".  BNP is likely going to remain elevated for some time now, but has dropped. I do not think he needs additional diuresis. PO diuretic restart tomorrow - but hold Aldactone until BMP check .  Agree with d/c home today with BNP/BMP check early next week & close fu clinic visit.  On BB,ARB, Nitrate with stable BP/HR. Plavix & statin.  Restart diuretics as described above.  Marykay Lex, M.D., M.S. [facility] [address] [place], [place]  [address]  [phone] Pager # [phone] 05/18/2012 2:33 PM

## 2012-05-18 NOTE — Progress Notes (Signed)
Patient's IV and telemetry has been discontinued, patient verbalizes understanding of discharge instructions; patient will be discharged home with daughter.  Lorretta Harp RN

## 2012-05-24 ENCOUNTER — Other Ambulatory Visit (HOSPITAL_COMMUNITY): Payer: Self-pay | Admitting: Cardiovascular Disease

## 2012-05-24 DIAGNOSIS — I313 Pericardial effusion (noninflammatory): Secondary | ICD-10-CM

## 2012-05-26 ENCOUNTER — Encounter (HOSPITAL_COMMUNITY): Payer: Medicare Other

## 2012-06-01 ENCOUNTER — Other Ambulatory Visit: Payer: Self-pay | Admitting: Dermatology

## 2012-06-02 ENCOUNTER — Ambulatory Visit (HOSPITAL_COMMUNITY)
Admission: RE | Admit: 2012-06-02 | Discharge: 2012-06-02 | Disposition: A | Discharge status: Home / Self Care | Payer: Medicare Other | Source: Ambulatory Visit | Attending: Cardiovascular Disease | Admitting: Cardiovascular Disease

## 2012-06-02 DIAGNOSIS — I252 Old myocardial infarction: Secondary | ICD-10-CM | POA: Insufficient documentation

## 2012-06-02 DIAGNOSIS — I313 Pericardial effusion (noninflammatory): Secondary | ICD-10-CM

## 2012-06-02 DIAGNOSIS — I10 Essential (primary) hypertension: Secondary | ICD-10-CM | POA: Insufficient documentation

## 2012-06-02 DIAGNOSIS — I2510 Atherosclerotic heart disease of native coronary artery without angina pectoris: Secondary | ICD-10-CM | POA: Insufficient documentation

## 2012-06-02 DIAGNOSIS — E785 Hyperlipidemia, unspecified: Secondary | ICD-10-CM | POA: Insufficient documentation

## 2012-06-02 DIAGNOSIS — J449 Chronic obstructive pulmonary disease, unspecified: Secondary | ICD-10-CM | POA: Insufficient documentation

## 2012-06-02 DIAGNOSIS — I359 Nonrheumatic aortic valve disorder, unspecified: Secondary | ICD-10-CM | POA: Insufficient documentation

## 2012-06-02 DIAGNOSIS — I739 Peripheral vascular disease, unspecified: Secondary | ICD-10-CM | POA: Insufficient documentation

## 2012-06-02 DIAGNOSIS — I2589 Other forms of chronic ischemic heart disease: Secondary | ICD-10-CM | POA: Insufficient documentation

## 2012-06-02 DIAGNOSIS — I319 Disease of pericardium, unspecified: Secondary | ICD-10-CM | POA: Insufficient documentation

## 2012-06-02 DIAGNOSIS — J4489 Other specified chronic obstructive pulmonary disease: Secondary | ICD-10-CM | POA: Insufficient documentation

## 2012-06-02 DIAGNOSIS — Z87891 Personal history of nicotine dependence: Secondary | ICD-10-CM | POA: Insufficient documentation

## 2012-06-02 DIAGNOSIS — Z8673 Personal history of transient ischemic attack (TIA), and cerebral infarction without residual deficits: Secondary | ICD-10-CM | POA: Insufficient documentation

## 2012-06-02 DIAGNOSIS — D472 Monoclonal gammopathy: Secondary | ICD-10-CM | POA: Insufficient documentation

## 2012-06-02 NOTE — Progress Notes (Signed)
[facility]   2D echo completed 06/02/2012.   Cindy Velmer Broadfoot, RDCS   

## 2012-06-08 ENCOUNTER — Ambulatory Visit (HOSPITAL_COMMUNITY)
Admission: RE | Admit: 2012-06-08 | Discharge: 2012-06-08 | Disposition: A | Discharge status: Home / Self Care | Payer: Medicare Other | Source: Ambulatory Visit | Attending: Cardiovascular Disease | Admitting: Cardiovascular Disease

## 2012-06-08 DIAGNOSIS — I252 Old myocardial infarction: Secondary | ICD-10-CM | POA: Insufficient documentation

## 2012-06-08 DIAGNOSIS — I6529 Occlusion and stenosis of unspecified carotid artery: Secondary | ICD-10-CM | POA: Insufficient documentation

## 2012-06-08 NOTE — Progress Notes (Signed)
Carotid duplex completed 

## 2012-06-09 ENCOUNTER — Encounter (HOSPITAL_COMMUNITY)
Admission: RE | Admit: 2012-06-09 | Discharge: 2012-06-09 | Disposition: A | Discharge status: Home / Self Care | Payer: Medicare Other | Source: Ambulatory Visit | Attending: Cardiovascular Disease | Admitting: Cardiovascular Disease

## 2012-06-09 DIAGNOSIS — I219 Acute myocardial infarction, unspecified: Secondary | ICD-10-CM | POA: Insufficient documentation

## 2012-06-09 DIAGNOSIS — Z5189 Encounter for other specified aftercare: Secondary | ICD-10-CM | POA: Insufficient documentation

## 2012-06-09 NOTE — Progress Notes (Signed)
Cardiac Rehab Medication Review by a Pharmacist  Does the patient  feel that his/her medications are working for him/her?  yes  Has the patient been experiencing any side effects to the medications prescribed?  no  Does the patient measure his/her own blood pressure or blood glucose at home?  yes   Does the patient have any problems obtaining medications due to transportation or finances?   no  Understanding of regimen: good Understanding of indications: good Potential of compliance: good  Pharmacist comments: Medication list was reviewed for accuracy, indications, adverse effects, and compliance. Any patient questions were answered at this time.  Abran Duke, PharmD Clinical Pharmacist Phone: [phone] Pager: [phone] 06/09/2012 8:54 AM

## 2012-06-13 ENCOUNTER — Encounter (HOSPITAL_COMMUNITY)
Admission: RE | Admit: 2012-06-13 | Discharge: 2012-06-13 | Disposition: A | Discharge status: Home / Self Care | Payer: Medicare Other | Source: Ambulatory Visit | Attending: Cardiovascular Disease | Admitting: Cardiovascular Disease

## 2012-06-13 ENCOUNTER — Encounter (HOSPITAL_COMMUNITY): Payer: Medicare Other

## 2012-06-13 NOTE — Progress Notes (Signed)
Pt started cardiac rehab today.  Pt tolerated light exercise without difficulty. Monitor showed SR with freq PAC's On EKG from Dr. Hazle Coca office (11/15). EKG showed SR with sinus arrythmia.  Will fax to Dr. Hazle Coca office for review. Continue to monitor.

## 2012-06-17 ENCOUNTER — Encounter (HOSPITAL_COMMUNITY)
Admission: RE | Admit: 2012-06-17 | Discharge: 2012-06-17 | Disposition: A | Discharge status: Home / Self Care | Payer: Medicare Other | Source: Ambulatory Visit | Attending: Cardiovascular Disease | Admitting: Cardiovascular Disease

## 2012-06-20 ENCOUNTER — Encounter (HOSPITAL_COMMUNITY): Payer: Medicare Other

## 2012-06-22 ENCOUNTER — Encounter (HOSPITAL_COMMUNITY): Payer: Medicare Other

## 2012-06-24 ENCOUNTER — Encounter (HOSPITAL_COMMUNITY)
Admission: RE | Admit: 2012-06-24 | Discharge: 2012-06-24 | Disposition: A | Discharge status: Home / Self Care | Payer: Medicare Other | Source: Ambulatory Visit | Attending: Cardiovascular Disease | Admitting: Cardiovascular Disease

## 2012-06-24 DIAGNOSIS — I219 Acute myocardial infarction, unspecified: Secondary | ICD-10-CM | POA: Insufficient documentation

## 2012-06-24 DIAGNOSIS — Z5189 Encounter for other specified aftercare: Secondary | ICD-10-CM | POA: Insufficient documentation

## 2012-06-24 NOTE — Progress Notes (Signed)
Reviewed home exercise with pt today.  Pt plans to walk and use stationary bike at home for exercise.  Reviewed THR, pulse, RPE, sign and symptoms, NTG use, and when to call 911 or MD.  Pt voiced understanding. Lamonta Cypress, MA, ACSM RCEP  

## 2012-06-27 ENCOUNTER — Encounter (HOSPITAL_COMMUNITY)
Admission: RE | Admit: 2012-06-27 | Discharge: 2012-06-27 | Disposition: A | Discharge status: Home / Self Care | Payer: Medicare Other | Source: Ambulatory Visit | Attending: Cardiovascular Disease | Admitting: Cardiovascular Disease

## 2012-06-29 ENCOUNTER — Encounter (HOSPITAL_COMMUNITY)
Admission: RE | Admit: 2012-06-29 | Discharge: 2012-06-29 | Disposition: A | Discharge status: Home / Self Care | Payer: Medicare Other | Source: Ambulatory Visit | Attending: Cardiovascular Disease | Admitting: Cardiovascular Disease

## 2012-07-01 ENCOUNTER — Encounter (HOSPITAL_COMMUNITY)
Admission: RE | Admit: 2012-07-01 | Discharge: 2012-07-01 | Disposition: A | Discharge status: Home / Self Care | Payer: Medicare Other | Source: Ambulatory Visit | Attending: Cardiovascular Disease | Admitting: Cardiovascular Disease

## 2012-07-04 ENCOUNTER — Encounter (HOSPITAL_COMMUNITY): Payer: Medicare Other

## 2012-07-06 ENCOUNTER — Encounter (HOSPITAL_COMMUNITY)
Admission: RE | Admit: 2012-07-06 | Discharge: 2012-07-06 | Disposition: A | Discharge status: Home / Self Care | Payer: Medicare Other | Source: Ambulatory Visit | Attending: Cardiovascular Disease | Admitting: Cardiovascular Disease

## 2012-07-08 ENCOUNTER — Encounter (HOSPITAL_COMMUNITY)
Admission: RE | Admit: 2012-07-08 | Discharge: 2012-07-08 | Disposition: A | Discharge status: Home / Self Care | Payer: Medicare Other | Source: Ambulatory Visit | Attending: Cardiovascular Disease | Admitting: Cardiovascular Disease

## 2012-07-11 ENCOUNTER — Encounter (HOSPITAL_COMMUNITY)
Admission: RE | Admit: 2012-07-11 | Discharge: 2012-07-11 | Disposition: A | Discharge status: Home / Self Care | Payer: Medicare Other | Source: Ambulatory Visit | Attending: Cardiovascular Disease | Admitting: Cardiovascular Disease

## 2012-07-13 ENCOUNTER — Encounter (HOSPITAL_COMMUNITY)
Admission: RE | Admit: 2012-07-13 | Discharge: 2012-07-13 | Disposition: A | Discharge status: Home / Self Care | Payer: Medicare Other | Source: Ambulatory Visit | Attending: Cardiovascular Disease | Admitting: Cardiovascular Disease

## 2012-07-15 ENCOUNTER — Encounter (HOSPITAL_COMMUNITY)
Admission: RE | Admit: 2012-07-15 | Discharge: 2012-07-15 | Disposition: A | Discharge status: Home / Self Care | Payer: Medicare Other | Source: Ambulatory Visit | Attending: Cardiovascular Disease | Admitting: Cardiovascular Disease

## 2012-07-18 ENCOUNTER — Encounter (HOSPITAL_COMMUNITY)
Admission: RE | Admit: 2012-07-18 | Discharge: 2012-07-18 | Disposition: A | Discharge status: Home / Self Care | Payer: Medicare Other | Source: Ambulatory Visit | Attending: Cardiovascular Disease | Admitting: Cardiovascular Disease

## 2012-07-20 ENCOUNTER — Encounter (HOSPITAL_COMMUNITY)
Admission: RE | Admit: 2012-07-20 | Discharge: 2012-07-20 | Disposition: A | Discharge status: Home / Self Care | Payer: Medicare Other | Source: Ambulatory Visit | Attending: Cardiovascular Disease | Admitting: Cardiovascular Disease

## 2012-07-20 NOTE — Progress Notes (Signed)
Pt brought home BP monitor today to cardiac rehab to check accuracy.  Pt reports this am upon awakening his home BP monitor read 180/101.  Manual BP-154/78, home monitor-157/81.  Pt reassured home monitor appears to be working correctly. Pt instructed to sit quietly momentarily prior to starting monitor.  Pt also instructed to notify his MD if he gets elevated reading again. Understanding verbalized

## 2012-07-21 ENCOUNTER — Other Ambulatory Visit: Payer: Self-pay | Admitting: Dermatology

## 2012-07-22 ENCOUNTER — Encounter (HOSPITAL_COMMUNITY)
Admission: RE | Admit: 2012-07-22 | Discharge: 2012-07-22 | Disposition: A | Discharge status: Home / Self Care | Payer: Medicare Other | Source: Ambulatory Visit | Attending: Cardiovascular Disease | Admitting: Cardiovascular Disease

## 2012-07-25 ENCOUNTER — Encounter (HOSPITAL_COMMUNITY)
Admission: RE | Admit: 2012-07-25 | Discharge: 2012-07-25 | Disposition: A | Discharge status: Home / Self Care | Payer: Medicare Other | Source: Ambulatory Visit | Attending: Cardiovascular Disease | Admitting: Cardiovascular Disease

## 2012-07-25 DIAGNOSIS — I219 Acute myocardial infarction, unspecified: Secondary | ICD-10-CM | POA: Insufficient documentation

## 2012-07-25 DIAGNOSIS — Z5189 Encounter for other specified aftercare: Secondary | ICD-10-CM | POA: Insufficient documentation

## 2012-07-27 ENCOUNTER — Encounter (HOSPITAL_COMMUNITY)
Admission: RE | Admit: 2012-07-27 | Discharge: 2012-07-27 | Disposition: A | Discharge status: Home / Self Care | Payer: Medicare Other | Source: Ambulatory Visit | Attending: Cardiovascular Disease | Admitting: Cardiovascular Disease

## 2012-07-29 ENCOUNTER — Encounter (HOSPITAL_COMMUNITY): Payer: Medicare Other

## 2012-08-01 ENCOUNTER — Encounter (HOSPITAL_COMMUNITY)
Admission: RE | Admit: 2012-08-01 | Discharge: 2012-08-01 | Disposition: A | Discharge status: Home / Self Care | Payer: Medicare Other | Source: Ambulatory Visit | Attending: Cardiovascular Disease | Admitting: Cardiovascular Disease

## 2012-08-01 NOTE — Progress Notes (Signed)
[name] [name] 72 y.o. male Nutrition Note Spoke with pt.  Nutrition Plan and Nutrition Survey goals reviewed with pt. Pt is following Step 1 of the Therapeutic Lifestyle Changes diet. Pt expressed understanding of the information reviewed. Pt aware of nutrition education classes offered and is unable to attend nutrition classes "because I still work." Pt reports tobacco cessation is going well. Pt denies significant increase in appetite. Per discussion, pt wt today 63.4 kg, which is up 2.1 kg over the past 6 weeks. Pt states his UBW is 140 lbs. Pt wt likely up due to increase in muscle mass and/or pt returning to his UBW. Pt reports he weighs daily due to CHF and his weight does not usually vary by more than 1 lb.   Nutrition Diagnosis   Food-and nutrition-related knowledge deficit related to lack of exposure to information as related to diagnosis of: ? CVD   Nutrition RX/ Estimated Daily Nutrition Needs for: wt maintenance 1850-2100 Kcal, 60-70 gm fat, 12-14 gm sat fat, 1.8-2.1 gm trans-fat, <1500 mg sodium  Nutrition Intervention   Pt's individual nutrition plan including cholesterol goals reviewed with pt.   Benefits of adopting Therapeutic Lifestyle Changes discussed when Medficts reviewed.   Pt to attend the Portion Distortion class - met 07/20/12   Pt given handouts for: ? Nutrition I class ? Nutrition II class   Continue client-centered nutrition education by RD, as part of interdisciplinary care. Goal(s)   Pt to identify and limit food sources of saturated fat, trans fat, and cholesterol Monitor and Evaluate progress toward nutrition goal with team. Nutrition Risk: Change to Moderate

## 2012-08-03 ENCOUNTER — Encounter (HOSPITAL_COMMUNITY)
Admission: RE | Admit: 2012-08-03 | Discharge: 2012-08-03 | Disposition: A | Discharge status: Home / Self Care | Payer: Medicare Other | Source: Ambulatory Visit | Attending: Cardiovascular Disease | Admitting: Cardiovascular Disease

## 2012-08-05 ENCOUNTER — Encounter (HOSPITAL_COMMUNITY): Payer: Medicare Other

## 2012-08-08 ENCOUNTER — Encounter (HOSPITAL_COMMUNITY)
Admission: RE | Admit: 2012-08-08 | Discharge: 2012-08-08 | Disposition: A | Discharge status: Home / Self Care | Payer: Medicare Other | Source: Ambulatory Visit | Attending: Cardiovascular Disease | Admitting: Cardiovascular Disease

## 2012-08-10 ENCOUNTER — Encounter (HOSPITAL_COMMUNITY)
Admission: RE | Admit: 2012-08-10 | Discharge: 2012-08-10 | Disposition: A | Discharge status: Home / Self Care | Payer: Medicare Other | Source: Ambulatory Visit | Attending: Cardiovascular Disease | Admitting: Cardiovascular Disease

## 2012-08-12 ENCOUNTER — Encounter (HOSPITAL_COMMUNITY)
Admission: RE | Admit: 2012-08-12 | Discharge: 2012-08-12 | Disposition: A | Discharge status: Home / Self Care | Payer: Medicare Other | Source: Ambulatory Visit | Attending: Cardiovascular Disease | Admitting: Cardiovascular Disease

## 2012-08-15 ENCOUNTER — Encounter (HOSPITAL_COMMUNITY): Payer: Medicare Other

## 2012-08-17 ENCOUNTER — Encounter (HOSPITAL_COMMUNITY): Payer: Medicare Other

## 2012-08-19 ENCOUNTER — Encounter (HOSPITAL_COMMUNITY): Payer: Medicare Other

## 2012-08-22 ENCOUNTER — Encounter (HOSPITAL_COMMUNITY)
Admission: RE | Admit: 2012-08-22 | Discharge: 2012-08-22 | Disposition: A | Discharge status: Home / Self Care | Payer: Medicare Other | Source: Ambulatory Visit | Attending: Cardiovascular Disease | Admitting: Cardiovascular Disease

## 2012-08-22 DIAGNOSIS — I219 Acute myocardial infarction, unspecified: Secondary | ICD-10-CM | POA: Insufficient documentation

## 2012-08-22 DIAGNOSIS — Z5189 Encounter for other specified aftercare: Secondary | ICD-10-CM | POA: Insufficient documentation

## 2012-08-24 ENCOUNTER — Encounter (HOSPITAL_COMMUNITY): Admission: RE | Admit: 2012-08-24 | Payer: Medicare Other | Source: Ambulatory Visit

## 2012-08-26 ENCOUNTER — Encounter (HOSPITAL_COMMUNITY): Payer: Medicare Other

## 2012-08-29 ENCOUNTER — Emergency Department (HOSPITAL_COMMUNITY): Payer: Medicare Other

## 2012-08-29 ENCOUNTER — Encounter (HOSPITAL_COMMUNITY): Payer: Medicare Other

## 2012-08-29 ENCOUNTER — Encounter (HOSPITAL_COMMUNITY): Payer: Self-pay | Admitting: Emergency Medicine

## 2012-08-29 ENCOUNTER — Emergency Department (HOSPITAL_COMMUNITY)
Admission: EM | Admit: 2012-08-29 | Discharge: 2012-08-29 | Disposition: A | Discharge status: Home / Self Care | Payer: Medicare Other | Attending: Emergency Medicine | Admitting: Emergency Medicine

## 2012-08-29 DIAGNOSIS — Z87891 Personal history of nicotine dependence: Secondary | ICD-10-CM | POA: Insufficient documentation

## 2012-08-29 DIAGNOSIS — Z79899 Other long term (current) drug therapy: Secondary | ICD-10-CM | POA: Insufficient documentation

## 2012-08-29 DIAGNOSIS — R112 Nausea with vomiting, unspecified: Secondary | ICD-10-CM | POA: Insufficient documentation

## 2012-08-29 DIAGNOSIS — Z862 Personal history of diseases of the blood and blood-forming organs and certain disorders involving the immune mechanism: Secondary | ICD-10-CM | POA: Insufficient documentation

## 2012-08-29 DIAGNOSIS — Z8673 Personal history of transient ischemic attack (TIA), and cerebral infarction without residual deficits: Secondary | ICD-10-CM | POA: Insufficient documentation

## 2012-08-29 DIAGNOSIS — Z8719 Personal history of other diseases of the digestive system: Secondary | ICD-10-CM | POA: Insufficient documentation

## 2012-08-29 DIAGNOSIS — Z8639 Personal history of other endocrine, nutritional and metabolic disease: Secondary | ICD-10-CM | POA: Insufficient documentation

## 2012-08-29 DIAGNOSIS — I10 Essential (primary) hypertension: Secondary | ICD-10-CM | POA: Insufficient documentation

## 2012-08-29 DIAGNOSIS — J159 Unspecified bacterial pneumonia: Secondary | ICD-10-CM | POA: Insufficient documentation

## 2012-08-29 DIAGNOSIS — R0602 Shortness of breath: Secondary | ICD-10-CM | POA: Insufficient documentation

## 2012-08-29 DIAGNOSIS — Z951 Presence of aortocoronary bypass graft: Secondary | ICD-10-CM | POA: Insufficient documentation

## 2012-08-29 DIAGNOSIS — R51 Headache: Secondary | ICD-10-CM | POA: Insufficient documentation

## 2012-08-29 DIAGNOSIS — Z8781 Personal history of (healed) traumatic fracture: Secondary | ICD-10-CM | POA: Insufficient documentation

## 2012-08-29 DIAGNOSIS — J189 Pneumonia, unspecified organism: Secondary | ICD-10-CM

## 2012-08-29 DIAGNOSIS — J449 Chronic obstructive pulmonary disease, unspecified: Secondary | ICD-10-CM

## 2012-08-29 DIAGNOSIS — I2510 Atherosclerotic heart disease of native coronary artery without angina pectoris: Secondary | ICD-10-CM | POA: Insufficient documentation

## 2012-08-29 DIAGNOSIS — Z7982 Long term (current) use of aspirin: Secondary | ICD-10-CM | POA: Insufficient documentation

## 2012-08-29 DIAGNOSIS — J441 Chronic obstructive pulmonary disease with (acute) exacerbation: Secondary | ICD-10-CM | POA: Insufficient documentation

## 2012-08-29 DIAGNOSIS — Z8679 Personal history of other diseases of the circulatory system: Secondary | ICD-10-CM | POA: Insufficient documentation

## 2012-08-29 DIAGNOSIS — I509 Heart failure, unspecified: Secondary | ICD-10-CM | POA: Insufficient documentation

## 2012-08-29 LAB — CBC WITH DIFFERENTIAL/PLATELET
Basophils Absolute: 0.1 10*3/uL (ref 0.0–0.1)
Basophils Relative: 1 % (ref 0–1)
Hemoglobin: 13.1 g/dL (ref 13.0–17.0)
MCHC: 33.1 g/dL (ref 30.0–36.0)
Monocytes Relative: 11 % (ref 3–12)
Neutro Abs: 6.7 10*3/uL (ref 1.7–7.7)
Neutrophils Relative %: 70 % (ref 43–77)

## 2012-08-29 LAB — COMPREHENSIVE METABOLIC PANEL
ALT: 11 U/L (ref 0–53)
AST: 17 U/L (ref 0–37)
Albumin: 3.5 g/dL (ref 3.5–5.2)
Alkaline Phosphatase: 72 U/L (ref 39–117)
BUN: 17 mg/dL (ref 6–23)
Chloride: 90 mEq/L — ABNORMAL LOW (ref 96–112)
Potassium: 3 mEq/L — ABNORMAL LOW (ref 3.5–5.1)
Sodium: 132 mEq/L — ABNORMAL LOW (ref 135–145)
Total Bilirubin: 0.5 mg/dL (ref 0.3–1.2)

## 2012-08-29 MED ORDER — FUROSEMIDE 10 MG/ML IJ SOLN
40.0000 mg | Freq: Once | INTRAMUSCULAR | Status: AC
  Administered 2012-08-29: 40 mg via INTRAVENOUS
  Filled 2012-08-29: qty 4

## 2012-08-29 MED ORDER — LEVOFLOXACIN 500 MG PO TABS: 500.0000 mg | ORAL_TABLET | Freq: Every day | ORAL | Status: DC

## 2012-08-29 NOTE — ED Notes (Addendum)
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  Patient reports nausea this morning.  Patient also reports productive cough x 2 weeks and denies fevers.

## 2012-08-29 NOTE — ED Notes (Signed)
Voiced understanding of instructions given 

## 2012-08-29 NOTE — ED Provider Notes (Signed)
History    [name] [name] is a 72 y.o. male with cc of chest pain radiating to left arm accompanied by nausea and vomiting since early this am.  He states that the chest pain is sharp and constant and the arm pain is intermittent, occurring several times in the past 2 hours.  He states that this feels exactly like the MI he had in September of 2013.  He experienced a very bad headache yesterday which resolved this morning when the above symptoms began.   With the snow storm in the past few days, he has had increased activity working outdoors and has also been using his fireplace more often with his power outage.  He has had increased cough with some phlegm production, much like he had when he was a smoker. He denies dizziness, LOC, diaphoresis, SOB, abdominal pain, back pain, swelling of any sort, weakness, fever, congestion or rhinorrhea.  The chest pain does not increase with walking nor does it decrease when leaning forward.  He has a history of cardiac stenting, CHF and pericardial effusion.  The patient states that he did take his morning medications and called his PCP, Dr. Ricki Miller, this am who referred him here.    CSN: [id number]  Arrival date & time 08/29/12  1016   First MD Initiated Contact with Patient 08/29/12 1020      Chief Complaint  Patient presents with  . Chest Pain  . Shortness of Breath    (Consider location/radiation/quality/duration/timing/severity/associated sxs/prior treatment) HPI  Past Medical History  Diagnosis Date  . Hypertension   . Hernia   . Stroke 2004    minor  . Hyperlipidemia   . Peripheral vascular disease   . Shoulder fracture, left   . COPD (chronic obstructive pulmonary disease)   . At risk for sudden cardiac death, due to [facility] 25-Mar-2012  . CHF (congestive heart failure)   . Cancer     skin cancer  . Heart attack   . Coronary artery disease   . Dysrhythmia     PAF  . Shortness of breath     Past Surgical History  Procedure Laterality Date   . Bypass graft  07/2010    axillobifemoral BPG  . Spine surgery  11/23/06    microdiscectomy L5-S1  . Hemorrhoid surgery    . Tonsillectomy    . Hernia repair  02/09/11    [facility]    Family History  Problem Relation Age of Onset  . Alzheimer's disease Mother   . Cancer Father     prostate  . Cancer Brother     prostate  . Stroke Brother     History  Substance Use Topics  . Smoking status: Former Smoker -- 1.00 packs/day for 50 years    Types: Cigarettes    Quit date: 03/16/2012  . Smokeless tobacco: Never Used  . Alcohol Use: 4.2 oz/week    7 Cans of beer per week     Comment: 2 drinks per day      Review of Systems  Constitutional: Negative for fever, chills, diaphoresis and appetite change.  HENT: Negative for congestion and neck pain.   Eyes: Negative for photophobia and pain.  Respiratory: Positive for cough.   Cardiovascular: Positive for chest pain. Negative for palpitations and leg swelling.  Gastrointestinal: Positive for nausea and vomiting. Negative for abdominal pain and constipation.  Musculoskeletal: Negative for back pain.       + left arm pain - chest wall tenderness  Neurological: Positive for headaches. Negative for dizziness, speech difficulty, weakness, light-headedness and numbness.    Allergies  Ceclor  Home Medications   Current Outpatient Rx  Name  Route  Sig  Dispense  Refill  . acetaminophen (TYLENOL) 325 MG tablet   Oral   Take 2 tablets (650 mg total) by mouth every 4 (four) hours as needed.         Marland Kitchen aspirin 81 MG chewable tablet   Oral   Chew 1 tablet (81 mg total) by mouth daily.         . clopidogrel (PLAVIX) 75 MG tablet   Oral   Take 75 mg by mouth daily.         . furosemide (LASIX) 40 MG tablet   Oral   Take 40 mg by mouth 2 (two) times daily.         Marland Kitchen gabapentin (NEURONTIN) 600 MG tablet   Oral   Take 600 mg by mouth 2 (two) times daily.          . isosorbide mononitrate (IMDUR) 30 MG 24 hr tablet    Oral   Take 1 tablet (30 mg total) by mouth daily.   30 tablet   6   . losartan (COZAAR) 25 MG tablet   Oral   Take 1 tablet (25 mg total) by mouth daily.   30 tablet   5   . metoprolol succinate (TOPROL-XL) 25 MG 24 hr tablet   Oral   Take 25 mg by mouth daily.         . nitroGLYCERIN (NITROSTAT) 0.4 MG SL tablet   Sublingual   Place 1 tablet (0.4 mg total) under the tongue every 5 (five) minutes x 3 doses as needed for chest pain.   25 tablet   4   . pantoprazole (PROTONIX) 40 MG tablet   Oral   Take 1 tablet (40 mg total) by mouth daily at 12 noon.   30 tablet   2   . levofloxacin (LEVAQUIN) 500 MG tablet   Oral   Take 1 tablet (500 mg total) by mouth daily.   7 tablet   0     BP 164/91  Pulse 81  Temp(Src) 97.9 F (36.6 C) (Oral)  SpO2 93%  Physical Exam  Nursing note and vitals reviewed. Constitutional: He is oriented to person, place, and time. He appears well-developed and well-nourished.  HENT:  Head: Normocephalic and atraumatic.  Eyes: Conjunctivae are normal. Pupils are equal, round, and reactive to light.  Neck: Normal range of motion. Neck supple. No JVD present. No thyromegaly present.  Cardiovascular: Normal rate and normal heart sounds.  An irregular rhythm present. Exam reveals no gallop and no friction rub.   No murmur heard. Pulmonary/Chest: Effort normal. No respiratory distress. He has no wheezes. He has rales in the right lower field and the left lower field. He exhibits no tenderness.  Bilateral lower lobe rales  Abdominal: Soft. Bowel sounds are normal. He exhibits no distension. There is no tenderness. There is no rebound.  Musculoskeletal: He exhibits no edema.  Neurological: He is alert and oriented to person, place, and time. No cranial nerve deficit.  Normal strength of extremities bilaterally  Skin: Skin is warm and dry.  Psychiatric: He has a normal mood and affect. His behavior is normal.    ED Course  Procedures  (including critical care time)  Labs Reviewed  CBC WITH DIFFERENTIAL - Abnormal; Notable for the following:  Monocytes Absolute 1.1 (*)    All other components within normal limits  COMPREHENSIVE METABOLIC PANEL - Abnormal; Notable for the following:    Sodium 132 (*)    Potassium 3.0 (*)    Chloride 90 (*)    Glucose, Bld 115 (*)    GFR calc non Af Amer 60 (*)    GFR calc Af Amer 70 (*)    All other components within normal limits  PRO B NATRIURETIC PEPTIDE - Abnormal; Notable for the following:    Pro B Natriuretic peptide (BNP) 7762.0 (*)    All other components within normal limits  POCT I-STAT TROPONIN I   Dg Chest Port 1 View  08/29/2012  *RADIOLOGY REPORT*  Clinical Data: Shortness of breath.  PORTABLE CHEST - 1 VIEW  Comparison: 05/15/2012  Findings: Hyperinflation of the lungs compatible with COPD.  Left basilar density, favor atelectasis.  No confluent opacity on the right.  Mild interstitial prominence throughout the lungs, stable. No visible effusions or acute bony abnormality.  IMPRESSION: Stable COPD and chronic interstitial changes.  Left base density, likely atelectasis.   Original Report Authenticated By: Charlett Nose, M.D.    i personally interpreted and reviewed ecg.   Date: 08/29/2012  Rate: 73   Rhythm: normal sinus rhythm  QRS Axis: normal  Intervals: normal and QT prolonged  ST/T Wave abnormalities: nonspecific ST changes  Conduction Disutrbances:none  Narrative Interpretation:   Old EKG Reviewed: unchanged     1. CAP (community acquired pneumonia)   2. CHF, acute   3. COPD (chronic obstructive pulmonary disease)       MDM   Patient feeling better with lasix.  Left base density found on xray, possible early pneumonia.  Patient does have bilateral lower lobe rales.  BNP elevated.  Patient stable.  VSS.  Offered to admit, but patient declines.  Patient spoke to his PCP while in the ED and they decided together that patient can go home on oral  antibiotics, keep close tabs on his weight and breathing, and call PCP for immediate appointment with any changes.  PCP made follow-up appointment with patient on Wednesday (48 hours).  Patient advised to see PCP immediately or return to ED with changes/worsening of symptoms including but not limited to:  fever, SOB, CP, dizziness, NVD.  Patient discussed with Dr. Hyacinth Meeker.  Patient acknowledges and agrees with this treatment plan.    Kimberlee Roche, PA-C 08/29/12 1337  Kimberlee Roche, PA-C 08/29/12 1346  [employer], PA-C 08/29/12 1354

## 2012-08-29 NOTE — ED Provider Notes (Addendum)
REview of MR shows Pt with hx of IMI in 9/13, had Aorto Bifem bypass and hx of CHF - [presents with recurrent SOB which has been worsning over 2 weeks but with increased in last 24 hours.  Has been doing increased exertional activity like cutting wood and removing downed trees from yard and driveway.  Mild CP with this, no fever, no swelling.  Sx are persistent and wrosening.  On exam has bialteral rales at the bases, no Peripheral Edema, no JVD, OP clear and mentation intact.  Heart without murmurs.    Needs lasix, CXR, eval for CHF vs PNA though favor the former, ECG with inferior changes c/w prior IMI.  At this time he has no STEMI criteria though ST abnormalities exist in lead aVF alone.  His last echo showed severe Inferior Wall akinesis and he was unable to be stented in the RCA when he had that STEMI  After evaluation it was found that the patient did not have an elevated troponin, chest x-ray was consistent with either early CHF or a unilateral infiltrate. I discussed with the patient utility of admission to the hospital versus home treatment, my recommendation was for inpatient treatment, the patient has chosen to go home with antibiotics, he has already arranged this followup with his primary care Dr.  He will return should his symptoms worsen. He is not in respiratory distress, and his vital signs do not reveal tachycardia hypoxia or any increased work of breathing or hypotension.  Filed Vitals:   08/29/12 1024 08/29/12 1342  BP: 164/91 146/82  Pulse: 81 82  Temp: 97.9 F (36.6 C)   TempSrc: Oral Oral  Resp:  20  SpO2: 93% 100%     Medical screening examination/treatment/procedure(s) were conducted as a shared visit with non-physician practitioner(s) and myself.  I personally evaluated the patient during the encounter    Vida Roller, MD 08/29/12 1116  Vida Roller, MD 08/30/12 [phone]

## 2012-08-30 NOTE — ED Provider Notes (Signed)
  Medical screening examination/treatment/procedure(s) were conducted as a shared visit with non-physician practitioner(s) and myself.  I personally evaluated the patient during the encounter  Please see my separate respective documentation pertaining to this patient encounter   Vida Roller, MD 08/30/12 [phone]

## 2012-08-31 ENCOUNTER — Encounter (HOSPITAL_COMMUNITY): Payer: Medicare Other

## 2012-09-02 ENCOUNTER — Encounter (HOSPITAL_COMMUNITY): Admission: RE | Admit: 2012-09-02 | Payer: Medicare Other | Source: Ambulatory Visit

## 2012-09-05 ENCOUNTER — Encounter (HOSPITAL_COMMUNITY)
Admission: RE | Admit: 2012-09-05 | Discharge: 2012-09-05 | Disposition: A | Discharge status: Home / Self Care | Payer: Medicare Other | Source: Ambulatory Visit | Attending: Cardiovascular Disease | Admitting: Cardiovascular Disease

## 2012-09-05 NOTE — Progress Notes (Signed)
Pt returned to 1115 cardiac rehab exercise class after brief absence due to Pneumonia.  Pt tolerated light exercise with no complaints and workloads modified for tolerance. Alanson Aly, BSN

## 2012-09-07 ENCOUNTER — Encounter (HOSPITAL_COMMUNITY)
Admission: RE | Admit: 2012-09-07 | Discharge: 2012-09-07 | Disposition: A | Discharge status: Home / Self Care | Payer: Medicare Other | Source: Ambulatory Visit | Attending: Cardiovascular Disease | Admitting: Cardiovascular Disease

## 2012-09-09 ENCOUNTER — Encounter (HOSPITAL_COMMUNITY)
Admission: RE | Admit: 2012-09-09 | Discharge: 2012-09-09 | Disposition: A | Discharge status: Home / Self Care | Payer: Medicare Other | Source: Ambulatory Visit | Attending: Cardiovascular Disease | Admitting: Cardiovascular Disease

## 2012-09-12 ENCOUNTER — Encounter (HOSPITAL_COMMUNITY)
Admission: RE | Admit: 2012-09-12 | Discharge: 2012-09-12 | Disposition: A | Discharge status: Home / Self Care | Payer: Medicare Other | Source: Ambulatory Visit | Attending: Cardiovascular Disease | Admitting: Cardiovascular Disease

## 2012-09-14 ENCOUNTER — Encounter (HOSPITAL_COMMUNITY)
Admission: RE | Admit: 2012-09-14 | Discharge: 2012-09-14 | Disposition: A | Discharge status: Home / Self Care | Payer: Medicare Other | Source: Ambulatory Visit | Attending: Cardiovascular Disease | Admitting: Cardiovascular Disease

## 2012-09-15 ENCOUNTER — Other Ambulatory Visit (INDEPENDENT_AMBULATORY_CARE_PROVIDER_SITE_OTHER): Payer: Self-pay | Admitting: Otolaryngology

## 2012-09-15 DIAGNOSIS — J383 Other diseases of vocal cords: Secondary | ICD-10-CM

## 2012-09-16 ENCOUNTER — Ambulatory Visit
Admission: RE | Admit: 2012-09-16 | Discharge: 2012-09-16 | Disposition: A | Discharge status: Home / Self Care | Payer: Medicare Other | Source: Ambulatory Visit | Attending: Otolaryngology | Admitting: Otolaryngology

## 2012-09-16 ENCOUNTER — Encounter (HOSPITAL_COMMUNITY)
Admission: RE | Admit: 2012-09-16 | Discharge: 2012-09-16 | Disposition: A | Discharge status: Home / Self Care | Payer: Medicare Other | Source: Ambulatory Visit | Attending: Cardiovascular Disease | Admitting: Cardiovascular Disease

## 2012-09-16 DIAGNOSIS — J383 Other diseases of vocal cords: Secondary | ICD-10-CM

## 2012-09-16 MED ORDER — IOHEXOL 300 MG/ML  SOLN
75.0000 mL | Freq: Once | INTRAMUSCULAR | Status: AC | PRN
  Administered 2012-09-16: 75 mL via INTRAVENOUS

## 2012-09-19 ENCOUNTER — Encounter (HOSPITAL_COMMUNITY)
Admission: RE | Admit: 2012-09-19 | Discharge: 2012-09-19 | Disposition: A | Discharge status: Home / Self Care | Payer: Medicare Other | Source: Ambulatory Visit | Attending: Cardiovascular Disease | Admitting: Cardiovascular Disease

## 2012-09-19 NOTE — Progress Notes (Signed)
While exercising on the stepper pt had two triplets and increased pvcs.  Pt bp checked 160/60.  Pt with shortness of breath and noted to be on higher workload than prescribed for him.  Exercise stopped and pt felt better to continue exercise lightly.  Pt did not have any additional ectopy during exercise than his usual.  Will fax to Dr. Allyson Sabal office strips for review.

## 2012-09-21 ENCOUNTER — Encounter (HOSPITAL_COMMUNITY)
Admission: RE | Admit: 2012-09-21 | Discharge: 2012-09-21 | Disposition: A | Discharge status: Home / Self Care | Payer: Medicare Other | Source: Ambulatory Visit | Attending: Cardiovascular Disease | Admitting: Cardiovascular Disease

## 2012-09-21 DIAGNOSIS — I219 Acute myocardial infarction, unspecified: Secondary | ICD-10-CM | POA: Insufficient documentation

## 2012-09-21 DIAGNOSIS — Z5189 Encounter for other specified aftercare: Secondary | ICD-10-CM | POA: Insufficient documentation

## 2012-09-22 ENCOUNTER — Other Ambulatory Visit (INDEPENDENT_AMBULATORY_CARE_PROVIDER_SITE_OTHER): Payer: Self-pay | Admitting: Otolaryngology

## 2012-09-22 ENCOUNTER — Ambulatory Visit
Admission: RE | Admit: 2012-09-22 | Discharge: 2012-09-22 | Disposition: A | Discharge status: Home / Self Care | Payer: Medicare Other | Source: Ambulatory Visit | Attending: Otolaryngology | Admitting: Otolaryngology

## 2012-09-22 DIAGNOSIS — E041 Nontoxic single thyroid nodule: Secondary | ICD-10-CM

## 2012-09-22 DIAGNOSIS — R9389 Abnormal findings on diagnostic imaging of other specified body structures: Secondary | ICD-10-CM

## 2012-09-22 DIAGNOSIS — R221 Localized swelling, mass and lump, neck: Secondary | ICD-10-CM

## 2012-09-23 ENCOUNTER — Encounter (HOSPITAL_COMMUNITY): Admission: RE | Admit: 2012-09-23 | Payer: Medicare Other | Source: Ambulatory Visit

## 2012-09-23 ENCOUNTER — Encounter (HOSPITAL_COMMUNITY): Payer: Self-pay | Admitting: Pharmacy Technician

## 2012-09-23 ENCOUNTER — Other Ambulatory Visit: Payer: Medicare Other

## 2012-09-23 ENCOUNTER — Telehealth: Payer: Self-pay | Admitting: Emergency Medicine

## 2012-09-23 ENCOUNTER — Telehealth (HOSPITAL_COMMUNITY): Payer: Self-pay | Admitting: *Deleted

## 2012-09-23 NOTE — Telephone Encounter (Signed)
CALLED PT TO MAKE HIM AWARE THAT DR WATTS REVIEWED THE U/S FROM YESTERDAY AND HE NEEDS TO HAVE AN  US GUIDED BX OF LT SUBMANDIBULAR LN AT THE HOSPITAL. SENT A MSG TO CENTRAL SCHEDULING AT [facility] AND ADVISED THEM TO CK ON COUMADIN.  PT STATED THAT DR Erlene Quan MONITORS HIS COUMADIN.

## 2012-09-26 ENCOUNTER — Encounter (HOSPITAL_COMMUNITY): Payer: Medicare Other

## 2012-09-26 ENCOUNTER — Other Ambulatory Visit: Payer: Self-pay | Admitting: Radiology

## 2012-09-28 ENCOUNTER — Encounter (HOSPITAL_COMMUNITY)
Admission: RE | Admit: 2012-09-28 | Discharge: 2012-09-28 | Disposition: A | Discharge status: Home / Self Care | Payer: Medicare Other | Source: Ambulatory Visit | Attending: Cardiovascular Disease | Admitting: Cardiovascular Disease

## 2012-09-29 ENCOUNTER — Ambulatory Visit (HOSPITAL_COMMUNITY)
Admission: RE | Admit: 2012-09-29 | Discharge: 2012-09-29 | Disposition: A | Discharge status: Home / Self Care | Payer: Medicare Other | Source: Ambulatory Visit | Attending: Otolaryngology | Admitting: Otolaryngology

## 2012-09-29 ENCOUNTER — Encounter (HOSPITAL_COMMUNITY): Payer: Self-pay

## 2012-09-29 DIAGNOSIS — J449 Chronic obstructive pulmonary disease, unspecified: Secondary | ICD-10-CM | POA: Insufficient documentation

## 2012-09-29 DIAGNOSIS — Z7902 Long term (current) use of antithrombotics/antiplatelets: Secondary | ICD-10-CM | POA: Insufficient documentation

## 2012-09-29 DIAGNOSIS — I10 Essential (primary) hypertension: Secondary | ICD-10-CM | POA: Insufficient documentation

## 2012-09-29 DIAGNOSIS — R220 Localized swelling, mass and lump, head: Secondary | ICD-10-CM

## 2012-09-29 DIAGNOSIS — I4891 Unspecified atrial fibrillation: Secondary | ICD-10-CM | POA: Insufficient documentation

## 2012-09-29 DIAGNOSIS — I509 Heart failure, unspecified: Secondary | ICD-10-CM | POA: Insufficient documentation

## 2012-09-29 DIAGNOSIS — R599 Enlarged lymph nodes, unspecified: Secondary | ICD-10-CM | POA: Insufficient documentation

## 2012-09-29 DIAGNOSIS — I2589 Other forms of chronic ischemic heart disease: Secondary | ICD-10-CM | POA: Insufficient documentation

## 2012-09-29 DIAGNOSIS — I252 Old myocardial infarction: Secondary | ICD-10-CM | POA: Insufficient documentation

## 2012-09-29 DIAGNOSIS — R9389 Abnormal findings on diagnostic imaging of other specified body structures: Secondary | ICD-10-CM

## 2012-09-29 DIAGNOSIS — Z7982 Long term (current) use of aspirin: Secondary | ICD-10-CM | POA: Insufficient documentation

## 2012-09-29 DIAGNOSIS — D487 Neoplasm of uncertain behavior of other specified sites: Secondary | ICD-10-CM | POA: Insufficient documentation

## 2012-09-29 DIAGNOSIS — Z8673 Personal history of transient ischemic attack (TIA), and cerebral infarction without residual deficits: Secondary | ICD-10-CM | POA: Insufficient documentation

## 2012-09-29 DIAGNOSIS — I2510 Atherosclerotic heart disease of native coronary artery without angina pectoris: Secondary | ICD-10-CM | POA: Insufficient documentation

## 2012-09-29 DIAGNOSIS — Z85828 Personal history of other malignant neoplasm of skin: Secondary | ICD-10-CM | POA: Insufficient documentation

## 2012-09-29 DIAGNOSIS — I739 Peripheral vascular disease, unspecified: Secondary | ICD-10-CM | POA: Insufficient documentation

## 2012-09-29 DIAGNOSIS — E785 Hyperlipidemia, unspecified: Secondary | ICD-10-CM | POA: Insufficient documentation

## 2012-09-29 DIAGNOSIS — J4489 Other specified chronic obstructive pulmonary disease: Secondary | ICD-10-CM | POA: Insufficient documentation

## 2012-09-29 LAB — CBC
HCT: 40.3 % (ref 39.0–52.0)
Hemoglobin: 14 g/dL (ref 13.0–17.0)
WBC: 9.5 10*3/uL (ref 4.0–10.5)

## 2012-09-29 LAB — APTT: aPTT: 35 seconds (ref 24–37)

## 2012-09-29 LAB — PROTIME-INR: INR: 1.14 (ref 0.00–1.49)

## 2012-09-29 MED ORDER — FENTANYL CITRATE 0.05 MG/ML IJ SOLN
INTRAMUSCULAR | Status: AC
  Filled 2012-09-29: qty 2

## 2012-09-29 MED ORDER — FENTANYL CITRATE 0.05 MG/ML IJ SOLN
INTRAMUSCULAR | Status: AC | PRN
  Administered 2012-09-29: 50 ug via INTRAVENOUS

## 2012-09-29 MED ORDER — MIDAZOLAM HCL 2 MG/2ML IJ SOLN
INTRAMUSCULAR | Status: AC | PRN
  Administered 2012-09-29: 1 mg via INTRAVENOUS

## 2012-09-29 MED ORDER — SODIUM CHLORIDE 0.9 % IV SOLN: Freq: Once | INTRAVENOUS | Status: DC

## 2012-09-29 MED ORDER — MIDAZOLAM HCL 2 MG/2ML IJ SOLN
INTRAMUSCULAR | Status: AC
  Filled 2012-09-29: qty 4

## 2012-09-29 MED ORDER — FENTANYL CITRATE 0.05 MG/ML IJ SOLN
INTRAMUSCULAR | Status: AC
  Filled 2012-09-29: qty 4

## 2012-09-29 NOTE — Procedures (Signed)
[place] core L supraclav LAN 18g x5 No complication No blood loss. See complete dictation in [facility].

## 2012-09-29 NOTE — H&P (Signed)
Chief Complaint: "I'm here for biopsy" Referring Physician:Teoh HPI: [name] [name] is an 72 y.o. male with newly found (L) neck neck nodule, possible from the thyroid but possibly an adjacent LN. He is referred for US guided biopsy. PMHx and meds reviewed. He has held his Plavix for the past 5 days.  Past Medical History:  Past Medical History  Diagnosis Date  . Hypertension   . Hernia   . Stroke 2004    minor  . Hyperlipidemia   . Peripheral vascular disease   . Shoulder fracture, left   . COPD (chronic obstructive pulmonary disease)   . At risk for sudden cardiac death, due to [facility] 04-02-12  . CHF (congestive heart failure)   . Cancer     skin cancer  . Heart attack   . Coronary artery disease   . Dysrhythmia     PAF  . Shortness of breath     Past Surgical History:  Past Surgical History  Procedure Laterality Date  . Bypass graft  07/2010    axillobifemoral BPG  . Spine surgery  11/23/06    microdiscectomy L5-S1  . Hemorrhoid surgery    . Tonsillectomy    . Hernia repair  02/09/11    [facility]    Family History:  Family History  Problem Relation Age of Onset  . Alzheimer's disease Mother   . Cancer Father     prostate  . Cancer Brother     prostate  . Stroke Brother     Social History:  reports that he quit smoking about 6 months ago. His smoking use included Cigarettes. He has a 50 pack-year smoking history. He has never used smokeless tobacco. He reports that he drinks about 4.2 ounces of alcohol per week. He reports that he does not use illicit drugs.  Allergies:  Allergies  Allergen Reactions  . Ceclor (Cefaclor) Anaphylaxis    Recently took pcn for tooth problems, without issues    Medications: aspirin 81 MG chewable tablet (Taking) 04/06/2012 Sig - Route: Chew 1 tablet (81 mg total) by mouth daily. - Oral Class: OTC Number of times this order has been changed since signing: 1 [address] clopidogrel (PLAVIX) 75 MG tablet (Taking) Sig - Route:  Take 75 mg by mouth daily. - Oral Class: Historical Med furosemide (LASIX) 40 MG tablet (Taking) Sig - Route: Take 40 mg by mouth 2 (two) times daily. - Oral Class: Historical Med gabapentin (NEURONTIN) 600 MG tablet (Taking) Sig - Route: Take 600 mg by mouth 2 (two) times daily. - Oral Class: Historical Med Number of times this order has been changed since signing: 2 Order [address] isosorbide mononitrate (IMDUR) 30 MG 24 hr tablet (Taking) 30 tablet 6 04/06/2012 Sig - Route: Take 1 tablet (30 mg total) by mouth daily. - Oral Number of times this order has been changed since signing: 1 Order [address] losartan (COZAAR) 25 MG tablet (Taking) 30 tablet 5 05/18/2012 Sig - Route: Take 1 tablet (25 mg total) by mouth daily. - Oral Number of times this order has been changed since signing: 1 Order [address] metoprolol succinate (TOPROL-XL) 25 MG 24 hr tablet (Taking) Sig - Route: Take 25 mg by mouth daily. - Oral Class: Historical Med Number of times this order has been changed since signing: 1 Order [address] nitroGLYCERIN (NITROSTAT) 0.4 MG SL tablet (Taking) 25 tablet 4 04/06/2012 Sig - Route: Place 1 tablet (0.4 mg total) under the tongue every 5 (five) minutes  x 3 doses as needed for chest pain. - Sublingual Number of times this order has been changed since signing: 1 Order [address] pantoprazole (PROTONIX) 40 MG tablet (Taking) 30 tablet 2 04/06/2012 Sig - Route: Take 1 tablet (40 mg total) by mouth daily at 12 noon. - Oral Number of times this order has been changed since signing: 1 Order [address] potassium chloride (K-DUR,KLOR-CON) 10 MEQ tablet (Taking) Sig - Route: Take 10 mEq by mouth daily. - Oral   Please HPI for pertinent positives, otherwise complete 10 system ROS negative.  Physical Exam: Blood pressure 150/70, pulse 61, temperature 97.4 F (36.3 C), temperature source Oral, resp. rate 18, height 5' 5.5" (1.664 m), weight 142 lb (64.411 kg), SpO2 93.00%. Body mass index is 23.26  kg/(m^2).   General Appearance:  Alert, cooperative, no distress, appears stated age  Head:  Normocephalic, without obvious abnormality, atraumatic  ENT: Unremarkable  Neck: Supple, symmetrical, trachea midline, difficult to palpate any grossly enlarged lesions  Lungs:   Clear to auscultation bilaterally, no w/r/r, respirations unlabored without use of accessory muscles.  Chest Wall:  No tenderness or deformity  Heart:  Regular rate and rhythm, S1, S2 normal, no murmur, rub or gallop.   Neurologic: Normal affect, no gross deficits.   Results for orders placed during the hospital encounter of 09/29/12 (from the past 48 hour(s))  APTT     Status: None   Collection Time    09/29/12 12:41 PM      Result Value Range   aPTT 35  24 - 37 seconds  CBC     Status: None   Collection Time    09/29/12 12:41 PM      Result Value Range   WBC 9.5  4.0 - 10.5 K/uL   RBC 4.78  4.22 - 5.81 MIL/uL   Hemoglobin 14.0  13.0 - 17.0 g/dL   HCT 16.1  09.6 - 04.5 %   MCV 84.3  78.0 - 100.0 fL   MCH 29.3  26.0 - 34.0 pg   MCHC 34.7  30.0 - 36.0 g/dL   RDW 40.9  81.1 - 91.4 %   Platelets 334  150 - 400 K/uL  PROTIME-INR     Status: None   Collection Time    09/29/12 12:41 PM      Result Value Range   Prothrombin Time 14.4  11.6 - 15.2 seconds   INR 1.14  0.00 - 1.49   No results found.  Assessment/Plan (L)neck thyroid lesion vs. Enlarged LN with associated lymphadenopathy. Plan for US guided biopsy of this lesion for tissue sampling. Labs reviewed. Consent signed in chart  Brayton El PA-C 09/29/2012, 1:47 PM

## 2012-09-29 NOTE — ED Notes (Signed)
Patient denies pain and is resting comfortably.  

## 2012-09-30 ENCOUNTER — Encounter (HOSPITAL_COMMUNITY): Payer: Medicare Other

## 2012-09-30 ENCOUNTER — Other Ambulatory Visit: Payer: Self-pay | Admitting: Otolaryngology

## 2012-10-03 ENCOUNTER — Encounter (HOSPITAL_COMMUNITY): Payer: Medicare Other

## 2012-10-03 ENCOUNTER — Telehealth (HOSPITAL_COMMUNITY): Payer: Self-pay | Admitting: *Deleted

## 2012-10-03 NOTE — Progress Notes (Signed)
Requested Stress (if ava.), EKG and most recent OV from Dr. Allyson Sabal with [facility].

## 2012-10-03 NOTE — Telephone Encounter (Signed)
Pt called out today and possible Wednesday. Pt found out today that he has thyroid cancer and is having surgery here at [facility] on Friday.

## 2012-10-03 NOTE — Pre-Procedure Instructions (Signed)
[name] [name]  10/03/2012   Your procedure is scheduled on:  April 18  Report to [facility] at 05:30 AM.  Call this number if you have problems the morning of surgery: [phone]   Remember:   Do not eat food or drink liquids after midnight.   Take these medicines the morning of surgery with A SIP OF WATER: Isosorbide, Metoprolol, Pantoprazole,    Do not wear jewelry, make-up or nail polish.  Do not wear lotions, powders, or perfumes. You may wear deodorant.  Do not shave 48 hours prior to surgery. Men may shave face and neck.  Do not bring valuables to the hospital.  Contacts, dentures or bridgework may not be worn into surgery.  Leave suitcase in the car. After surgery it may be brought to your room.  For patients admitted to the hospital, checkout time is 11:00 AM the day of discharge.   Special Instructions: Shower using CHG 2 nights before surgery and the night before surgery.  If you shower the day of surgery use CHG.  Use special wash - you have one bottle of CHG for all showers.  You should use approximately 1/3 of the bottle for each shower.   Please read over the following fact sheets that you were given: Pain Booklet, Coughing and Deep Breathing and Surgical Site Infection Prevention

## 2012-10-04 ENCOUNTER — Encounter (HOSPITAL_COMMUNITY): Payer: Self-pay

## 2012-10-04 ENCOUNTER — Encounter (HOSPITAL_COMMUNITY)
Admission: RE | Admit: 2012-10-04 | Discharge: 2012-10-04 | Disposition: A | Discharge status: Home / Self Care | Payer: Medicare Other | Source: Ambulatory Visit | Attending: Otolaryngology | Admitting: Otolaryngology

## 2012-10-04 HISTORY — DX: Inflammatory liver disease, unspecified: K75.9

## 2012-10-04 HISTORY — DX: Cardiac murmur, unspecified: R01.1

## 2012-10-04 LAB — CBC
HCT: 39.6 % (ref 39.0–52.0)
Hemoglobin: 13.6 g/dL (ref 13.0–17.0)
RDW: 14.6 % (ref 11.5–15.5)
WBC: 11.6 10*3/uL — ABNORMAL HIGH (ref 4.0–10.5)

## 2012-10-04 LAB — COMPREHENSIVE METABOLIC PANEL
ALT: 10 U/L (ref 0–53)
AST: 20 U/L (ref 0–37)
Albumin: 3.6 g/dL (ref 3.5–5.2)
CO2: 37 mEq/L — ABNORMAL HIGH (ref 19–32)
Calcium: 9.4 mg/dL (ref 8.4–10.5)
Chloride: 86 mEq/L — ABNORMAL LOW (ref 96–112)
GFR calc non Af Amer: 57 mL/min — ABNORMAL LOW (ref >90)
Sodium: 130 mEq/L — ABNORMAL LOW (ref 135–145)

## 2012-10-04 LAB — SURGICAL PCR SCREEN: Staphylococcus aureus: NEGATIVE

## 2012-10-04 NOTE — Progress Notes (Signed)
Anesthesia PAT Evaluation:  Patient is a 72 year old male scheduled for thyroidectomy and radical left neck dissection for newly diagnosed thyroid cancer on 10/07/12 by Dr. Suszanne Conners.   History includes CAD with acute STEMI and unsuccessful RCA PCI on 03/17/12, post-MI afib, chronic combined CHF with last hospitalization for exacerbation and pericardial effusion 04/2012, ischemic cardiomyopathy, COPD with PRN night time O2 and ongoing smoking (he states he has only had about 1/2 pack of cigarettes over the last six months), CVA '04, PAD s/p left axillo-bi-femoral BPG '12, HTN, childhood hepatitis, skin cancer, L5-S1 microdiskectomy '08, ventral hernia repair '12.  He has had hoarseness since around September 2013.  Patient reports that he was told that he had left vocal cord dysfunction which could be related to lymphadenopathy. PCP is Dr. Ricki Miller. Cardiologist is Dr. Allyson Sabal.  Pulmonologist is Dr. Delton Coombes.    He was last seen at [facility] on 09/23/12 by Huey Bienenstock, PA-C.  Patient denies any new CV symptoms since this visit.  He last took Nitro two months ago.  He is an active participant in cardiac rehab and is doing well.  Patient held his ASA and Plavix starting on 09/29/12.  Limited echo on 06/02/12 showed: - Left ventricle: The cavity size was normal. Systolic function was normal. The estimated ejection fraction was in the range of 55% to 60%. Wall motion was normal; there were no regional wall motion abnormalities. Doppler parameters are consistent with abnormal left ventricular relaxation (grade 1 diastolic dysfunction). - Aortic valve: Trileaflet; mildly thickened, mildly calcified leaflets. Mild regurgitation. - Mitral valve: Trivial regurgitation. - Pericardium, extracardiac: A trivial 7.3 mm pericardial effusion was identified posterior to the heart.  Cardiac cath on 03/17/12 showed marked left main with 25% ostial stenosis. LAD is a large proximally and appears heavily calcified. There are diffuse mild to  moderate areas of atherosclerosis. Large second diagonal. Medium first diagonal. Mid to distal LAD is medium size and widely patent there are faint left to right collaterals from the septal perforator branches. Medium-low circumflex. Median OM1 with mild luminal irregularities. The remainder of the circumflex and no known have mild luminal irregularities. RCA is heavily calcified, large dominant vessel. Significant tortuosity and moderate atherosclerosis in the midsection. 99% distal RCA stenosis followed by a more moderate 60% stenosis. The posterior lateral artery is a large vessel with mild atherosclerosis.PDA is a medium-sized vessel with mild atherosclerosis.  Please refer to full report for the PCI narrative, but in summary, a DES was deployed in the distal RCA and due to catheter induced proximal RCA dissection a DES was deployed in the proximal vessel.  Balloon angioplasty to the entire RCA post dissection that only resulted in TIMI 1 flow.  There were faint left to right collaterals at the beginning of the case.  RCA remained subtotally occluded (with resulting moderate inferior hypokinesis, EF 30/35% by 03/17/12 echo).  CXR on 10/04/12 showed borderline enlargement of cardiac silhouette. COPD with stable scarring. No acute abnormalities.  PULMONARY FUNCTON TEST 10/13/2011  FVC 4.14  FEV1 2.4  FEV1/FVC 58  FVC % Predicted 102  FEV % Predicted 87  FeF 25-75 0.86  FeF 25-75 % Predicted 2.56   Carotid duplex on 06/08/12 showed 50-69% right ICA stenosis, 70-99% left CCA stenosis, no significant left ICA stenosis. Bilateral progression of SCA stenosis. Six month follow-up was recommended.  Preoperative labs noted. Na 130, Cl 86. Cr 1.23.  Glucose 74.  WBC 11.6.  H/H 13.6/39.6.  He is on BID Lasix.  Exam shows a pleasant, Caucasian male in NAD.  + Hoarseness.  Lungs clear.  Heart RRR with an occasional ectopic beat.  No significant murmurs noted.  No LE edema.  I spoke with Huey Bienenstock, PA-C  regarding upcoming surgery and that patient is off his ASA and Plavix.  After review with Dr. Allyson Sabal, Arlys John called me back and stated that patient would be higher risk due to his known RCA occlusion, but no additional preoperative cardiac testing would be recommended at this time.  Cardiac history and BMET results reviewed with Anesthesiologist Dr. Jacklynn Bue.  No plan for repeat labs preoperatively.  He may need NaCl perioperatively.  If no acute changes or new CV/CFH signs/symptoms then would anticipate he could proceed as planned.  Patient will be re-evaluated by his assigned anesthesiologist on the day of surgery.  Velna Ochs [facility] Stay [facility]/Anesthesiology Phone [phone] 10/04/2012 5:40 PM

## 2012-10-04 NOTE — Progress Notes (Signed)
Revonda Standard notified of patient's cardiac hx, MI  02/2012 ETC.  [facility] WILL SEE PATIENT FOR CONSULT.

## 2012-10-04 NOTE — Progress Notes (Signed)
10/04/12 1441  OBSTRUCTIVE SLEEP APNEA  Score 4 or greater  Results sent to PCP

## 2012-10-05 ENCOUNTER — Encounter (HOSPITAL_COMMUNITY): Admission: RE | Admit: 2012-10-05 | Payer: Medicare Other | Source: Ambulatory Visit

## 2012-10-05 ENCOUNTER — Encounter (HOSPITAL_COMMUNITY): Payer: Self-pay

## 2012-10-06 ENCOUNTER — Telehealth: Payer: Self-pay | Admitting: Emergency Medicine

## 2012-10-06 NOTE — Telephone Encounter (Signed)
lmtcb x1 

## 2012-10-06 NOTE — Telephone Encounter (Signed)
Pt returned call He stated that [facility] called him and requested that our office call them regarding his O2 Pt stated he is scheduled for thyroidectomy and no call back is needed Per pt, he only wears O2 at bedtime [facility], spoke with Britta Mccreedy who reported that there is not outstanding documentation in pt's chart Per Loran Senters is only needed once per year and this is not needed until 03/2013 Also per Britta Mccreedy, this department has left for the day Since our office is closed tomorrow and pt is scheduled for surgery, asked that [facility] call back on Monday 4.21.14

## 2012-10-06 NOTE — Telephone Encounter (Signed)
lmomtcb for the pt.  

## 2012-10-06 NOTE — Telephone Encounter (Signed)
Returning call can be reached at [phone].[name] [name]

## 2012-10-07 ENCOUNTER — Encounter (HOSPITAL_COMMUNITY): Payer: Self-pay | Admitting: *Deleted

## 2012-10-07 ENCOUNTER — Encounter (HOSPITAL_COMMUNITY)
Admission: RE | Disposition: A | Discharge status: Home / Self Care | Payer: Self-pay | Source: Ambulatory Visit | Attending: Otolaryngology

## 2012-10-07 ENCOUNTER — Ambulatory Visit (HOSPITAL_COMMUNITY)
Admission: RE | Admit: 2012-10-07 | Discharge: 2012-10-10 | Disposition: A | Discharge status: Home / Self Care | Payer: Medicare Other | Source: Ambulatory Visit | Attending: Otolaryngology | Admitting: Otolaryngology

## 2012-10-07 ENCOUNTER — Encounter (HOSPITAL_COMMUNITY): Payer: Medicare Other

## 2012-10-07 ENCOUNTER — Encounter (HOSPITAL_COMMUNITY): Payer: Self-pay | Admitting: Vascular Surgery

## 2012-10-07 ENCOUNTER — Ambulatory Visit (HOSPITAL_COMMUNITY): Payer: Medicare Other | Admitting: Certified Registered"

## 2012-10-07 DIAGNOSIS — Z01812 Encounter for preprocedural laboratory examination: Secondary | ICD-10-CM | POA: Insufficient documentation

## 2012-10-07 DIAGNOSIS — C770 Secondary and unspecified malignant neoplasm of lymph nodes of head, face and neck: Secondary | ICD-10-CM | POA: Insufficient documentation

## 2012-10-07 DIAGNOSIS — Z79899 Other long term (current) drug therapy: Secondary | ICD-10-CM | POA: Insufficient documentation

## 2012-10-07 DIAGNOSIS — C73 Malignant neoplasm of thyroid gland: Secondary | ICD-10-CM | POA: Insufficient documentation

## 2012-10-07 HISTORY — PX: THYROIDECTOMY: SHX17

## 2012-10-07 HISTORY — PX: RADICAL NECK DISSECTION: SHX2284

## 2012-10-07 SURGERY — THYROIDECTOMY
Anesthesia: General | Site: Neck | Laterality: Left | Wound class: Clean

## 2012-10-07 MED ORDER — METOPROLOL SUCCINATE ER 25 MG PO TB24
25.0000 mg | ORAL_TABLET | Freq: Every day | ORAL | Status: DC
  Administered 2012-10-08 – 2012-10-10 (×3): 25 mg via ORAL
  Filled 2012-10-07 (×3): qty 1

## 2012-10-07 MED ORDER — GABAPENTIN 600 MG PO TABS
600.0000 mg | ORAL_TABLET | Freq: Two times a day (BID) | ORAL | Status: DC
  Administered 2012-10-07 – 2012-10-10 (×6): 600 mg via ORAL
  Filled 2012-10-07 (×7): qty 1

## 2012-10-07 MED ORDER — HYDROCODONE-ACETAMINOPHEN 5-325 MG PO TABS: 1.0000 | ORAL_TABLET | ORAL | Status: DC | PRN

## 2012-10-07 MED ORDER — LIDOCAINE HCL (CARDIAC) 20 MG/ML IV SOLN
INTRAVENOUS | Status: DC | PRN
  Administered 2012-10-07: 40 mg via INTRAVENOUS

## 2012-10-07 MED ORDER — LOSARTAN POTASSIUM 25 MG PO TABS
25.0000 mg | ORAL_TABLET | Freq: Every day | ORAL | Status: DC
  Administered 2012-10-07 – 2012-10-10 (×4): 25 mg via ORAL
  Filled 2012-10-07 (×4): qty 1

## 2012-10-07 MED ORDER — GLYCOPYRROLATE 0.2 MG/ML IJ SOLN
INTRAMUSCULAR | Status: DC | PRN
  Administered 2012-10-07: .6 mg via INTRAVENOUS

## 2012-10-07 MED ORDER — FENTANYL CITRATE 0.05 MG/ML IJ SOLN
INTRAMUSCULAR | Status: DC | PRN
  Administered 2012-10-07: 100 ug via INTRAVENOUS
  Administered 2012-10-07 (×7): 50 ug via INTRAVENOUS
  Administered 2012-10-07: 100 ug via INTRAVENOUS
  Administered 2012-10-07: 50 ug via INTRAVENOUS

## 2012-10-07 MED ORDER — LACTATED RINGERS IV SOLN
INTRAVENOUS | Status: DC | PRN
  Administered 2012-10-07 (×2): via INTRAVENOUS

## 2012-10-07 MED ORDER — FUROSEMIDE 40 MG PO TABS
40.0000 mg | ORAL_TABLET | Freq: Two times a day (BID) | ORAL | Status: DC
  Administered 2012-10-07 – 2012-10-10 (×6): 40 mg via ORAL
  Filled 2012-10-07 (×8): qty 1

## 2012-10-07 MED ORDER — ISOSORBIDE MONONITRATE ER 30 MG PO TB24
30.0000 mg | ORAL_TABLET | Freq: Every day | ORAL | Status: DC
  Administered 2012-10-08 – 2012-10-10 (×3): 30 mg via ORAL
  Filled 2012-10-07 (×3): qty 1

## 2012-10-07 MED ORDER — PROMETHAZINE HCL 25 MG PO TABS: 25.0000 mg | ORAL_TABLET | Freq: Four times a day (QID) | ORAL | Status: DC | PRN

## 2012-10-07 MED ORDER — HYDROMORPHONE HCL PF 1 MG/ML IJ SOLN
INTRAMUSCULAR | Status: AC
  Administered 2012-10-07: 0.25 mg via INTRAVENOUS
  Filled 2012-10-07: qty 1

## 2012-10-07 MED ORDER — PROPOFOL 10 MG/ML IV BOLUS
INTRAVENOUS | Status: DC | PRN
  Administered 2012-10-07: 50 mg via INTRAVENOUS
  Administered 2012-10-07: 150 mg via INTRAVENOUS

## 2012-10-07 MED ORDER — ACETAMINOPHEN 10 MG/ML IV SOLN
INTRAVENOUS | Status: DC | PRN
  Administered 2012-10-07: 1000 mg via INTRAVENOUS

## 2012-10-07 MED ORDER — LIDOCAINE-EPINEPHRINE 1 %-1:100000 IJ SOLN
INTRAMUSCULAR | Status: DC | PRN
  Administered 2012-10-07: 20 mL

## 2012-10-07 MED ORDER — ACETAMINOPHEN 10 MG/ML IV SOLN
INTRAVENOUS | Status: AC
  Filled 2012-10-07: qty 100

## 2012-10-07 MED ORDER — KCL IN DEXTROSE-NACL 20-5-0.45 MEQ/L-%-% IV SOLN
INTRAVENOUS | Status: DC
  Administered 2012-10-07 – 2012-10-09 (×3): via INTRAVENOUS
  Filled 2012-10-07 (×7): qty 1000

## 2012-10-07 MED ORDER — POTASSIUM CHLORIDE CRYS ER 10 MEQ PO TBCR
10.0000 meq | EXTENDED_RELEASE_TABLET | Freq: Every day | ORAL | Status: DC
  Administered 2012-10-08 – 2012-10-10 (×3): 10 meq via ORAL
  Filled 2012-10-07 (×4): qty 1

## 2012-10-07 MED ORDER — 0.9 % SODIUM CHLORIDE (POUR BTL) OPTIME
TOPICAL | Status: DC | PRN
  Administered 2012-10-07: 1000 mL

## 2012-10-07 MED ORDER — CEFAZOLIN SODIUM-DEXTROSE 2-3 GM-% IV SOLR
INTRAVENOUS | Status: AC
  Administered 2012-10-07: 2 g via INTRAVENOUS
  Filled 2012-10-07: qty 50

## 2012-10-07 MED ORDER — CLINDAMYCIN PHOSPHATE 300 MG/50ML IV SOLN
300.0000 mg | Freq: Three times a day (TID) | INTRAVENOUS | Status: AC
  Administered 2012-10-07 – 2012-10-08 (×5): 300 mg via INTRAVENOUS
  Filled 2012-10-07 (×6): qty 50

## 2012-10-07 MED ORDER — NITROGLYCERIN 0.4 MG SL SUBL: 0.4000 mg | SUBLINGUAL_TABLET | SUBLINGUAL | Status: DC | PRN

## 2012-10-07 MED ORDER — MORPHINE SULFATE 2 MG/ML IJ SOLN: 2.0000 mg | INTRAMUSCULAR | Status: DC | PRN

## 2012-10-07 MED ORDER — ZOLPIDEM TARTRATE 5 MG PO TABS: 5.0000 mg | ORAL_TABLET | Freq: Every evening | ORAL | Status: DC | PRN

## 2012-10-07 MED ORDER — ROCURONIUM BROMIDE 100 MG/10ML IV SOLN
INTRAVENOUS | Status: DC | PRN
  Administered 2012-10-07: 10 mg via INTRAVENOUS
  Administered 2012-10-07: 40 mg via INTRAVENOUS

## 2012-10-07 MED ORDER — PANTOPRAZOLE SODIUM 40 MG PO TBEC
40.0000 mg | DELAYED_RELEASE_TABLET | Freq: Every day | ORAL | Status: DC
  Administered 2012-10-08 – 2012-10-10 (×3): 40 mg via ORAL
  Filled 2012-10-07 (×3): qty 1

## 2012-10-07 MED ORDER — PROMETHAZINE HCL 25 MG RE SUPP: 25.0000 mg | Freq: Four times a day (QID) | RECTAL | Status: DC | PRN

## 2012-10-07 MED ORDER — ACETAMINOPHEN 10 MG/ML IV SOLN: 1000.0000 mg | Freq: Once | INTRAVENOUS | Status: DC | PRN

## 2012-10-07 MED ORDER — MIDAZOLAM HCL 5 MG/5ML IJ SOLN
INTRAMUSCULAR | Status: DC | PRN
  Administered 2012-10-07 (×2): 1 mg via INTRAVENOUS

## 2012-10-07 MED ORDER — OXYCODONE-ACETAMINOPHEN 5-325 MG PO TABS
1.0000 | ORAL_TABLET | ORAL | Status: DC | PRN
  Administered 2012-10-07 – 2012-10-10 (×11): 1 via ORAL
  Filled 2012-10-07 (×11): qty 1

## 2012-10-07 MED ORDER — LIDOCAINE-EPINEPHRINE 1 %-1:100000 IJ SOLN
INTRAMUSCULAR | Status: AC
  Filled 2012-10-07: qty 1

## 2012-10-07 MED ORDER — NEOSTIGMINE METHYLSULFATE 1 MG/ML IJ SOLN
INTRAMUSCULAR | Status: DC | PRN
  Administered 2012-10-07: 5 mg via INTRAVENOUS

## 2012-10-07 MED ORDER — ONDANSETRON HCL 4 MG/2ML IJ SOLN
INTRAMUSCULAR | Status: DC | PRN
  Administered 2012-10-07: 4 mg via INTRAVENOUS

## 2012-10-07 MED ORDER — DEXAMETHASONE SODIUM PHOSPHATE 10 MG/ML IJ SOLN
INTRAMUSCULAR | Status: DC | PRN
  Administered 2012-10-07: 8 mg via INTRAVENOUS

## 2012-10-07 MED ORDER — HYDROMORPHONE HCL PF 1 MG/ML IJ SOLN
0.2500 mg | INTRAMUSCULAR | Status: DC | PRN
  Administered 2012-10-07: 0.25 mg via INTRAVENOUS
  Administered 2012-10-07: 0.5 mg via INTRAVENOUS

## 2012-10-07 MED ORDER — BACITRACIN ZINC 500 UNIT/GM EX OINT
TOPICAL_OINTMENT | CUTANEOUS | Status: AC
  Filled 2012-10-07: qty 15

## 2012-10-07 MED ORDER — ARTIFICIAL TEARS OP OINT
TOPICAL_OINTMENT | OPHTHALMIC | Status: DC | PRN
  Administered 2012-10-07: 1 via OPHTHALMIC

## 2012-10-07 SURGICAL SUPPLY — 69 items
ADH SKN CLS APL DERMABOND .7 (GAUZE/BANDAGES/DRESSINGS) ×4
APPLIER CLIP 9.375 SM OPEN (CLIP)
APR CLP SM 9.3 20 MLT OPN (CLIP)
ATTRACTOMAT 16X20 MAGNETIC DRP (DRAPES) IMPLANT
BLADE SURG 15 STRL LF DISP TIS (BLADE) ×6 IMPLANT
BLADE SURG 15 STRL SS (BLADE)
BLADE SURG CLIPPER 3M 9600 (MISCELLANEOUS) IMPLANT
CANISTER SUCTION 2500CC (MISCELLANEOUS) ×3 IMPLANT
CLEANER TIP ELECTROSURG 2X2 (MISCELLANEOUS) ×3 IMPLANT
CLIP APPLIE 9.375 SM OPEN (CLIP) IMPLANT
CLIP TI WIDE RED SMALL 24 (CLIP) IMPLANT
CLOTH BEACON ORANGE TIMEOUT ST (SAFETY) ×3 IMPLANT
CONT SPEC 4OZ CLIKSEAL STRL BL (MISCELLANEOUS) ×4 IMPLANT
CORDS BIPOLAR (ELECTRODE) ×3 IMPLANT
COVER SURGICAL LIGHT HANDLE (MISCELLANEOUS) ×3 IMPLANT
DECANTER SPIKE VIAL GLASS SM (MISCELLANEOUS) ×1 IMPLANT
DERMABOND ADVANCED (GAUZE/BANDAGES/DRESSINGS) ×2
DERMABOND ADVANCED .7 DNX12 (GAUZE/BANDAGES/DRESSINGS) ×2 IMPLANT
DRAIN CHANNEL 10F 3/8 F FF (DRAIN) ×4 IMPLANT
DRAIN SNY 10 ROU (WOUND CARE) IMPLANT
DRAIN WOUND SNY 15 RND (WOUND CARE) IMPLANT
ELECT COATED BLADE 2.86 ST (ELECTRODE) ×3 IMPLANT
ELECT REM PT RETURN 9FT ADLT (ELECTROSURGICAL) ×3
ELECTRODE REM PT RTRN 9FT ADLT (ELECTROSURGICAL) ×2 IMPLANT
EVACUATOR SILICONE 100CC (DRAIN) ×4 IMPLANT
GAUZE SPONGE 4X4 16PLY XRAY LF (GAUZE/BANDAGES/DRESSINGS) ×11 IMPLANT
GLOVE BIO SURGEON STRL SZ7.5 (GLOVE) ×4 IMPLANT
GLOVE BIO SURGEON STRL SZ8 (GLOVE) ×2 IMPLANT
GLOVE BIOGEL PI IND STRL 7.5 (GLOVE) IMPLANT
GLOVE BIOGEL PI INDICATOR 7.5 (GLOVE) ×2
GLOVE ECLIPSE 7.5 STRL STRAW (GLOVE) ×3 IMPLANT
GLOVE SS BIOGEL STRL SZ 6.5 (GLOVE) IMPLANT
GLOVE SUPERSENSE BIOGEL SZ 6.5 (GLOVE) ×2
GLOVE SURG SS PI 6.5 STRL IVOR (GLOVE) ×2 IMPLANT
GLOVE SURG SS PI 8.5 STRL IVOR (GLOVE) ×1
GLOVE SURG SS PI 8.5 STRL STRW (GLOVE) IMPLANT
GOWN PREVENTION PLUS XLARGE (GOWN DISPOSABLE) ×1 IMPLANT
GOWN STRL NON-REIN LRG LVL3 (GOWN DISPOSABLE) ×9 IMPLANT
KIT BASIN OR (CUSTOM PROCEDURE TRAY) ×3 IMPLANT
KIT ROOM TURNOVER OR (KITS) ×3 IMPLANT
LOCATOR NERVE 3 VOLT (DISPOSABLE) ×1 IMPLANT
LOOP VESSEL MAXI BLUE (MISCELLANEOUS) IMPLANT
NDL HYPO 25X1 1.5 SAFETY (NEEDLE) IMPLANT
NEEDLE HYPO 25X1 1.5 SAFETY (NEEDLE) ×3 IMPLANT
NS IRRIG 1000ML POUR BTL (IV SOLUTION) ×3 IMPLANT
PAD ARMBOARD 7.5X6 YLW CONV (MISCELLANEOUS) ×6 IMPLANT
PENCIL BUTTON HOLSTER BLD 10FT (ELECTRODE) ×3 IMPLANT
SHEARS HARMONIC 9CM CVD (BLADE) ×1 IMPLANT
SNYDER BIF Y CONN (MISCELLANEOUS) IMPLANT
SPECIMEN JAR MEDIUM (MISCELLANEOUS) ×3 IMPLANT
SPONGE INTESTINAL PEANUT (DISPOSABLE) ×5 IMPLANT
SPONGE LAP 18X18 X RAY DECT (DISPOSABLE) ×3 IMPLANT
STAPLER VISISTAT 35W (STAPLE) ×3 IMPLANT
SUT ETHILON 3 0 PS 1 (SUTURE) ×3 IMPLANT
SUT SILK 2 0 FS (SUTURE) ×2 IMPLANT
SUT SILK 2 0 REEL (SUTURE) IMPLANT
SUT SILK 2 0 SH CR/8 (SUTURE) ×3 IMPLANT
SUT SILK 3 0 REEL (SUTURE) ×7 IMPLANT
SUT SILK 4 0 REEL (SUTURE) ×3 IMPLANT
SUT VIC AB 3-0 SH 27 (SUTURE) ×3
SUT VIC AB 3-0 SH 27X BRD (SUTURE) ×8 IMPLANT
SUT VICRYL 4-0 PS2 18IN ABS (SUTURE) ×6 IMPLANT
TOWEL OR 17X24 6PK STRL BLUE (TOWEL DISPOSABLE) ×3 IMPLANT
TOWEL OR 17X26 10 PK STRL BLUE (TOWEL DISPOSABLE) ×3 IMPLANT
TRAY ENT MC OR (CUSTOM PROCEDURE TRAY) ×3 IMPLANT
TRAY FOLEY CATH 14FRSI W/METER (CATHETERS) ×1 IMPLANT
TUBE FEEDING 10FR FLEXIFLO (MISCELLANEOUS) IMPLANT
UNDERPAD 30X30 INCONTINENT (UNDERPADS AND DIAPERS) ×2 IMPLANT
WATER STERILE IRR 1000ML POUR (IV SOLUTION) ×3 IMPLANT

## 2012-10-07 NOTE — Anesthesia Procedure Notes (Signed)
Procedure Name: Intubation Date/Time: 10/07/2012 9:25 AM Performed by: Sherie Don Pre-anesthesia Checklist: Patient identified, Emergency Drugs available, Suction available, Patient being monitored and Timeout performed Patient Re-evaluated:Patient Re-evaluated prior to inductionOxygen Delivery Method: Circle system utilized Preoxygenation: Pre-oxygenation with 100% oxygen Intubation Type: IV induction Ventilation: Mask ventilation without difficulty Laryngoscope Size: Miller and 2 Grade View: Grade III Tube type: Oral Tube size: 7.5 mm Number of attempts: 2 Airway Equipment and Method: Stylet (tracheal deviation to the right cricoid pressure for visualization of arytenoids Miller 2 by Dr. Noreene Larsson) Placement Confirmation: ETT inserted through vocal cords under direct vision and positive ETCO2 Secured at: 23 cm Tube secured with: Tape Dental Injury: Teeth and Oropharynx as per pre-operative assessment  Difficulty Due To: Difficulty was anticipated Future Recommendations: Recommend- induction with short-acting agent, and alternative techniques readily available

## 2012-10-07 NOTE — Preoperative (Signed)
Beta Blockers   Reason not to administer Beta Blockers: Beta blocker 10/07/12

## 2012-10-07 NOTE — Brief Op Note (Signed)
  10/07/2012  1:30 PM  PATIENT:  [name] [name]  72 y.o. male  PRE-OPERATIVE DIAGNOSIS:  Thyroid carcinoma, lymph node metastasis  POST-OPERATIVE DIAGNOSIS:  Thyroid carcinoma, lymph node metastasis  PROCEDURE:  Procedure(s): TOTAL THYROIDECTOMY WITH LEFT MODIFIED RADICAL NECK DISSECTION   SURGEON:  Surgeon(s) and Role:    * Sui W Grettel Rames, MD - Primary  PHYSICIAN ASSISTANT:   ASSISTANTS: none   ANESTHESIA:   general  EBL:  Total I/O In: 2300 [I.V.:2300] Out: 700 [Urine:400; Blood:300]  BLOOD ADMINISTERED:none  DRAINS: (2) Jackson-Pratt drain(s) with closed bulb suction in the neck   LOCAL MEDICATIONS USED:  LIDOCAINE   SPECIMEN:  Source of Specimen:  Total thyroid,  left level 2,3,4 neck dissection, left supraclavicular lymph node  DISPOSITION OF SPECIMEN:  PATHOLOGY  COUNTS:  YES  TOURNIQUET:  * No tourniquets in log *  DICTATION: .Other Dictation: Dictation Number [id number]  PLAN OF CARE: Admit for overnight observation  PATIENT DISPOSITION:  PACU - hemodynamically stable.   Delay start of Pharmacological VTE agent (>24hrs) due to surgical blood loss or risk of bleeding: no

## 2012-10-07 NOTE — Progress Notes (Signed)
Pt states he took Nitroglycerin yesterday "just for the hell of it".No chest pain.

## 2012-10-07 NOTE — Transfer of Care (Signed)
Immediate Anesthesia Transfer of Care Note  Patient: [name] [name]  Procedure(s) Performed: Procedure(s): THYROIDECTOMY (Bilateral) RADICAL NECK DISSECTION (Left)  Patient Location: PACU  Anesthesia Type:General  Level of Consciousness: sedated and Patient remains intubated per anesthesia plan  Airway & Oxygen Therapy: Patient Spontanous Breathing and Patient remains intubated per anesthesia plan  Post-op Assessment: Report given to PACU RN and Post -op Vital signs reviewed and stable placed on ventilator by RT CPAP only  Post vital signs: Reviewed and stable  Complications: No apparent anesthesia complications

## 2012-10-07 NOTE — Anesthesia Preprocedure Evaluation (Addendum)
Anesthesia Evaluation  Patient identified by MRN, date of birth, ID band Patient awake    Reviewed: Allergy & Precautions, H&P , NPO status , Patient's Chart, lab work & pertinent test results, reviewed documented beta blocker date and time   Airway Mallampati: II      Dental  (+) Teeth Intact and Dental Advisory Given   Pulmonary  breath sounds clear to auscultation        Cardiovascular Rhythm:Regular Rate:Normal     Neuro/Psych    GI/Hepatic   Endo/Other    Renal/GU      Musculoskeletal   Abdominal   Peds  Hematology   Anesthesia Other Findings   Reproductive/Obstetrics                           Anesthesia Physical Anesthesia Plan  ASA: III  Anesthesia Plan: General   Post-op Pain Management:    Induction: Intravenous  Airway Management Planned: Oral ETT  Additional Equipment:   Intra-op Plan:   Post-operative Plan: Extubation in OR  Informed Consent: I have reviewed the patients History and Physical, chart, labs and discussed the procedure including the risks, benefits and alternatives for the proposed anesthesia with the patient or authorized representative who has indicated his/her understanding and acceptance.   Dental advisory given  Plan Discussed with: CRNA, Anesthesiologist and Surgeon  Anesthesia Plan Comments: (thyroid Ca Smoker/COPD uses home O2 at night CAD S/P Stemi 03/17/12 unsuccessful PTCA of RCA, no angina, no wall motion abnormalities by ECHO 06/02/12 PVD S/P Ax-Bifem BPG 2/12 Htn  Plan GA with art line  Kipp Brood, MD)      Anesthesia Quick Evaluation

## 2012-10-07 NOTE — H&P (Signed)
  H&P Update  Pt's original H&P dated 09/30/12 reviewed and placed in chart (to be scanned).  I personally examined the patient today.  No change in health. Proceed with total thyroidectomy and left neck dissection.

## 2012-10-08 MED ORDER — ALBUTEROL SULFATE (5 MG/ML) 0.5% IN NEBU: 2.5000 mg | INHALATION_SOLUTION | RESPIRATORY_TRACT | Status: DC | PRN

## 2012-10-08 MED ORDER — CALCIUM CARBONATE 1250 (500 CA) MG PO TABS
1.0000 | ORAL_TABLET | Freq: Three times a day (TID) | ORAL | Status: DC
  Administered 2012-10-08 – 2012-10-10 (×9): 500 mg via ORAL
  Filled 2012-10-08 (×12): qty 1

## 2012-10-08 MED ORDER — TIOTROPIUM BROMIDE MONOHYDRATE 18 MCG IN CAPS
18.0000 ug | ORAL_CAPSULE | Freq: Every day | RESPIRATORY_TRACT | Status: DC
  Administered 2012-10-08 – 2012-10-10 (×3): 18 ug via RESPIRATORY_TRACT
  Filled 2012-10-08: qty 5

## 2012-10-08 MED ORDER — HYDROMORPHONE HCL PF 1 MG/ML IJ SOLN: 1.0000 mg | INTRAMUSCULAR | Status: DC | PRN

## 2012-10-08 NOTE — Op Note (Signed)
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  PROCEDURE PERFORMED:  Total thyroidectomy with modified left radical neck dissection.  ANESTHESIA:  General endotracheal tube anesthesia.  COMPLICATIONS:  None.  ESTIMATED BLOOD LOSS:  300 mL.  INDICATION FOR PROCEDURE:  The patient is a 72 year old male who was recently seen for persistent hoarseness.  On examination, he was noted to have significant displacement of his larynx, with left vocal cord paralysis.  His subsequent neck CT scan showed left thyroid mass, as well as multiple enlarged lymph nodes within the left neck and superior mediastinum.  He underwent fine-needle biopsy by radiologist.  The results were consistent with metastatic papillary thyroid carcinoma. Based on the above findings, the decision was made for the patient to undergo the above stated procedure.  The risks, benefits, alternatives, and details of the procedure were discussed with the patient and his family.  Questions were invited and answered.  Informed consent was obtained.  DESCRIPTION OF PROCEDURE:  The patient was taken to the operating room and placed supine on the operating table.  General endotracheal tube anesthesia was administered by the anesthesiologist.  Preop IV antibiotics was given.  The patient was positioned and prepped and draped in a standard fashion for neck surgery.  Lidocaine 1% with 1:100,000 epinephrine was injected at the planned site of incision.  A hockey stick   curvilinear incision was made from the anterior neck up to the left mastoid process.  The incision was carried down to the level of the platysma muscles.  Both superiorly based and inferiorly based subplatysmal flaps were elevated in a standard fashion.  At this point, the strap muscles on the left side was noted to be completely adherent to the underlying left thyroid mass.  The strap muscles were divided at midline and retracted laterally.  Careful dissection was carried out around the left thyroid lobe.  However, the left thyroid lobe was noted to be rock hard and completely attached to the underlying trachea and thyroid cartilage.  It was also noted to have invaded into the adjacent internal jugular vein and carotid artery.  The vagus nerve was also involved.  There was a significant difficulty attempting to dissect the plane between the tumor and the underlying healthy tissue.  As a result, the decision was made to perform the right hemithyroidectomy first.  The strap muscles on the right  was carefully retracted, exposing the right thyroid lobe.  The right thyroid lobe was then dissected free from the surrounding soft tissue.  The superior thyroid bundle and the medial thyroid vein were divided and ligated.  The right recurrent laryngeal nerve was identified and preserved.  The  right thyroid lobe was then freed from the surrounding attachment and it was reflected anteriorly. By carefully  dissecting between the plane of the trachea and thyroid, I was able to carefully resect left thyroid mass from the trachea and the thyroid cartilage.  However, due to the invasion of the thyroid cancer, residual thyroid mass was noted on the surface of the trachea and the thyroid cartilage.  The left recurrent nerve was noted to be invaded by the tumor and was therefore sacrificed and removed.  The left thyroid mass was then carefully reflected to the left.  A combination of sharp and blunt  dissection was carried out to free the left thyroid mass from the underlying attachment.  Since the tumor was noted to have invaded into the internal jugular vein, a portion of the jugular vein was ligated and resected together with the thyroid mass.  Palpation of the carotid artery revealed pulsatile flow through the carotid.  As a result, the decision was made to preserve the carotid.  The tumor was then sharply dissected free from the vagus nerve and the carotid artery. Bulky tumor was still noted to reside within the carotid artery. Attention was then focused on performing the left neck dissection.  The sternocleidomastoid muscles were retracted laterally, exposing the jugular digastric lymph node chains.  The left level 2, 3, and 4 lymph nodes were dissected from the sternocleidomastoid muscles and the carotid artery.  Multiple enlarged lymph nodes were noted within the levels 2, 3, and 4 lymph node chain.  The entire neck dissection specimen was then sent together with the total thyroid specimen to the Pathology Department for permanent histologic identification. Examination of the neck at this point revealed another large left supraclavicular mass, immediately deep to the left clavicle.  That mass was carefully dissected from the surrounding soft tissue.  It was also sent to the Pathology Department for permanent histologic identification.  Examination of the paratracheal region showed to add additional firm mass within the mediastinal and inferior neck region. The 2 masses were noted to have invaded into the trachea and completely adherent to the trachea.  The peritracheal mass was left behind.  The surgical site was copiously irrigated.  Hemostasis was achieved.  Two #10 JP drains were placed.  The JP drains were secured in place with 3-0 silk sutures.  The incision was then closed in layers with 4-0 Vicryl sutures and Dermabond that concluded procedure for the patient.  The care  of the patient was turned over to the anesthesiologist.  The patient was awakened from anesthesia without difficulty.  He was extubated and transferred to the recovery room in good condition.  OPERATIVE FINDINGS:  A large left thyroid mass was noted to have invaded into the trachea, thyroid cartilage, left recurrent laryngeal nerve, left carotid artery, left vagus nerve, and the left internal jugular vein.  A total thyroidectomy specimen was obtained, together with left internal jugular vein, and the left level 2, 3, and 4 neck dissection specimens.  Multiple enlarged lymph nodes were noted within the left jugular digastric lymph node chain.  Another large left supraclavicular mass was also removed.  Bulky disease was left behind inside the left carotid artery as well as the left paratracheal/mediastinal area.  SPECIMEN:  Total thyroidectomy specimen, left internal jugular vein, left level 2, 3, and 4 specimens, and left supraclavicular mass.  FOLLOWUP CARE:  The patient will be admitted to the hospital for observation.  We will perform serial calcium level check.  Newman Pies, MD     ST/MEDQ  D:  10/07/2012  T:  10/08/2012  Job:  [id number]  cc:   [name], M.D.

## 2012-10-08 NOTE — Progress Notes (Signed)
Nutrition Brief Note  Patient identified on the Malnutrition Screening Tool (MST) Report for weight loss. From review of usual weights, patient has been gaining weight.  Wt Readings from Last 10 Encounters:  10/07/12 143 lb 4.8 oz (65 kg)  10/07/12 143 lb 4.8 oz (65 kg)  10/04/12 141 lb 9.6 oz (64.229 kg)  09/29/12 142 lb (64.411 kg)  06/09/12 135 lb 2.3 oz (61.3 kg)  05/18/12 122 lb 12.7 oz (55.7 kg)  04/30/12 129 lb 9.6 oz (58.786 kg)  04/27/12 131 lb 6.4 oz (59.603 kg)  04/11/12 131 lb 9.6 oz (59.693 kg)  04/06/12 123 lb 14.4 oz (56.2 kg)     Body mass index is 23.85 kg/(m^2). Patient meets criteria for normal weight based on current BMI.   Current diet order is Dysphagia 3 with thin liquids (mechanical soft), patient tolerated PO's well this AM per MD note. Labs and medications reviewed.   No nutrition interventions warranted at this time. If nutrition issues arise, please consult RD.   Joaquin Courts, RD, LDN, CNSC Pager [phone] After Hours Pager [phone]

## 2012-10-08 NOTE — Progress Notes (Signed)
Subjective: Pt without c/o.  Tolerated po this AM.  Still hoarse.  Objective: Vital signs in last 24 hours: Temp:  [97.5 F (36.4 C)-98.9 F (37.2 C)] 98.3 F (36.8 C) (04/19 0800) Pulse Rate:  [57-79] 69 (04/19 0500) Resp:  [12-21] 21 (04/19 0500) BP: (125-156)/(58-105) 144/69 mmHg (04/19 0800) SpO2:  [88 %-100 %] 93 % (04/19 0500) Arterial Line BP: (144-188)/(56-87) 157/60 mmHg (04/19 0500) FiO2 (%):  [40 %] 40 % (04/18 1410) Weight:  [65 kg (143 lb 4.8 oz)] 65 kg (143 lb 4.8 oz) (04/18 1610)  General appearance: alert and cooperative Neurologic: Grossly normal Incision/Wound: clean, dry, and intact.  JP in place.  No results found for this basename: WBC, HGB, HCT, PLT,  in the last 72 hours  Recent Labs  10/07/12 2210 10/08/12 0600  CALCIUM 8.0* 7.5*    Medications:  I have reviewed the patient's current medications. Continuous: . dextrose 5 % and 0.45 % NaCl with KCl 20 mEq/L 75 mL/hr at 10/08/12 0826   ZOX:[id number] (DILAUDID) injection, nitroGLYCERIN, oxyCODONE-acetaminophen, promethazine, promethazine, zolpidem  Assessment/Plan: POD #1 s/p total thyroidectomy and left neck dissection.  Healing well.  Will d/c Foley and artline.  Will start Ca supplement. Continue to check serial Calcium.   LOS: 1 day   Gillie Fleites,SUI W 10/08/2012, 9:00 AM

## 2012-10-09 LAB — CALCIUM
Calcium: 7.4 mg/dL — ABNORMAL LOW (ref 8.4–10.5)
Calcium: 7.4 mg/dL — ABNORMAL LOW (ref 8.4–10.5)
Calcium: 7.4 mg/dL — ABNORMAL LOW (ref 8.4–10.5)
Calcium: 7 mg/dL — ABNORMAL LOW (ref 8.4–10.5)

## 2012-10-09 MED ORDER — CLINDAMYCIN PHOSPHATE 300 MG/50ML IV SOLN
300.0000 mg | Freq: Four times a day (QID) | INTRAVENOUS | Status: AC
  Administered 2012-10-09 – 2012-10-10 (×5): 300 mg via INTRAVENOUS
  Filled 2012-10-09 (×5): qty 50

## 2012-10-09 NOTE — Progress Notes (Signed)
Subjective: Pt without complaint.  Tolerating po. Voice still hoarse.  Not stridorous.  Objective: Vital signs in last 24 hours: Temp:  [97.5 F (36.4 C)-98.6 F (37 C)] 98.2 F (36.8 C) (04/20 0800) Pulse Rate:  [59-74] 69 (04/20 0514) Resp:  [11-21] 15 (04/20 0514) BP: (140-160)/(65-80) 153/70 mmHg (04/20 0514) SpO2:  [94 %-99 %] 94 % (04/20 0514)  General appearance: alert and cooperative  Neurologic: Grossly normal  Incision/Wound: clean, dry, and intact. Mild edema and erythema. JP in place.   No results found for this basename: WBC, HGB, HCT, PLT,  in the last 72 hours  Recent Labs  10/09/12 0022 10/09/12 0451  CALCIUM 7.4* 7.4*    Medications:  I have reviewed the patient's current medications. Scheduled: . calcium carbonate  1 tablet Oral TID WC & HS  . furosemide  40 mg Oral BID  . gabapentin  600 mg Oral BID  . isosorbide mononitrate  30 mg Oral Daily  . losartan  25 mg Oral Daily  . metoprolol succinate  25 mg Oral Daily  . pantoprazole  40 mg Oral Q1200  . potassium chloride  10 mEq Oral Daily  . tiotropium  18 mcg Inhalation Daily   Continuous: . dextrose 5 % and 0.45 % NaCl with KCl 20 mEq/L 75 mL/hr at 10/09/12 0736    Assessment/Plan: POD #2 s/p total thyroidectomy and left neck dissection.  Calcium level is still decreasing.  On Os-Cal QID. - JP removed. - Will add clindamycin. - Will d/c home once calcium is stabilized.   LOS: 2 days   Carlyon Nolasco,SUI W 10/09/2012, 10:23 AM

## 2012-10-10 ENCOUNTER — Encounter (HOSPITAL_COMMUNITY): Payer: Medicare Other

## 2012-10-10 ENCOUNTER — Encounter (INDEPENDENT_AMBULATORY_CARE_PROVIDER_SITE_OTHER): Payer: Self-pay | Admitting: Otolaryngology

## 2012-10-10 DIAGNOSIS — C73 Malignant neoplasm of thyroid gland: Secondary | ICD-10-CM

## 2012-10-10 LAB — CALCIUM: Calcium: 7.2 mg/dL — ABNORMAL LOW (ref 8.4–10.5)

## 2012-10-10 MED ORDER — OXYCODONE-ACETAMINOPHEN 5-325 MG PO TABS: 1.0000 | ORAL_TABLET | ORAL | Status: DC | PRN

## 2012-10-10 MED ORDER — LEVOTHYROXINE SODIUM 100 MCG PO TABS: 100.0000 ug | ORAL_TABLET | Freq: Every day | ORAL | Status: DC

## 2012-10-10 MED ORDER — CLINDAMYCIN HCL 300 MG PO CAPS: 300.0000 mg | ORAL_CAPSULE | Freq: Four times a day (QID) | ORAL | Status: AC

## 2012-10-10 MED ORDER — CALCIUM CARBONATE 1250 (500 CA) MG PO TABS: 1.0000 | ORAL_TABLET | Freq: Four times a day (QID) | ORAL | Status: AC

## 2012-10-10 NOTE — Telephone Encounter (Signed)
Spoke with Derria from [facility], she has also stated the same. Patient is not due for any more o2 documentation until 04/05/13. ATC patient to make him aware of this and nothing further needed at this time.

## 2012-10-10 NOTE — Anesthesia Postprocedure Evaluation (Signed)
  Anesthesia Post-op Note  Patient: [name] [name]  Procedure(s) Performed: Procedure(s): THYROIDECTOMY (Bilateral) RADICAL NECK DISSECTION (Left)  Patient Location: PACU and Nursing Unit  Anesthesia Type:General  Level of Consciousness: awake, alert  and oriented  Airway and Oxygen Therapy: Patient Spontanous Breathing  Post-op Pain: mild  Post-op Assessment: Post-op Vital signs reviewed, Patient's Cardiovascular Status Stable, Respiratory Function Stable, Patent Airway and No signs of Nausea or vomiting  Post-op Vital Signs: Reviewed and stable  Complications: No apparent anesthesia complications

## 2012-10-10 NOTE — Discharge Summary (Signed)
Physician Discharge Summary  Patient ID: [name] [name] MRN: [id number] DOB/AGE: 72-Jul-1942 72 y.o.  Admit date: 10/07/2012 Discharge date: 10/10/2012  Admission Diagnoses: Papillary thyroid carcinoma  Discharge Diagnoses: Papillary thyroid carcinoma Principal Problem:   Malignant neoplasm of thyroid gland   Discharged Condition: good  Hospital Course: The patient underwent total thyroidectomy and left neck dissection on April 18.  The patient had a smooth recovery course. However, the patient was noted to have postop hypocalcemia. His calcium level eventually stabilized on postop day #3. The patient was discharged home on oral Os-Cal 4 times a day. His incision was clean dry and intact. His voice remains hoarse. He has no dyspnea or difficulty breathing. He was tolerating oral intake well.  Consults: None  Significant Diagnostic Studies: None  Treatments: surgery: Total thyroidectomy and left modified radical neck dissection.  Discharge Exam: Blood pressure 156/71, pulse 133, temperature 97.9 F (36.6 C), temperature source Oral, resp. rate 21, height 5\' 5"  (1.651 m), weight 65 kg (143 lb 4.8 oz), SpO2 99.00%. The patient's incision is clean dry and intact. His breathing is not labored. His voice is hoarse. Moderate edema is noted around the incision site.   Disposition: 01-Home or Self Care  Discharge Orders   Future Appointments Provider Department Dept Phone   05/03/2013 11:00 AM [facility] [phone]   05/03/2013 11:40 AM Evern Bio, NP [facility] [phone]   Future Orders Complete By Expires     Diet general  As directed     Increase activity slowly  As directed         Medication List    STOP taking these medications       aspirin 81 MG chewable tablet     clopidogrel 75 MG tablet  Commonly known as:  PLAVIX      TAKE these medications       calcium carbonate 1250 MG  tablet  Commonly known as:  OS-CAL - dosed in mg of elemental calcium  Take 1 tablet (500 mg of elemental calcium total) by mouth 4 (four) times daily.     clindamycin 300 MG capsule  Commonly known as:  CLEOCIN  Take 1 capsule (300 mg total) by mouth 4 (four) times daily.     furosemide 40 MG tablet  Commonly known as:  LASIX  Take 40 mg by mouth 2 (two) times daily.     gabapentin 600 MG tablet  Commonly known as:  NEURONTIN  Take 600 mg by mouth 2 (two) times daily.     isosorbide mononitrate 30 MG 24 hr tablet  Commonly known as:  IMDUR  Take 1 tablet (30 mg total) by mouth daily.     levothyroxine 100 MCG tablet  Commonly known as:  SYNTHROID  Take 1 tablet (100 mcg total) by mouth daily before breakfast.     losartan 25 MG tablet  Commonly known as:  COZAAR  Take 1 tablet (25 mg total) by mouth daily.     metoprolol succinate 25 MG 24 hr tablet  Commonly known as:  TOPROL-XL  Take 25 mg by mouth daily.     nitroGLYCERIN 0.4 MG SL tablet  Commonly known as:  NITROSTAT  Place 1 tablet (0.4 mg total) under the tongue every 5 (five) minutes x 3 doses as needed for chest pain.     oxyCODONE-acetaminophen 5-325 MG per tablet  Commonly known as:  PERCOCET/ROXICET  Take 1-2 tablets by mouth  every 4 (four) hours as needed for pain.     pantoprazole 40 MG tablet  Commonly known as:  PROTONIX  Take 1 tablet (40 mg total) by mouth daily at 12 noon.     potassium chloride 10 MEQ tablet  Commonly known as:  K-DUR,KLOR-CON  Take 10 mEq by mouth daily.           Follow-up Information   Follow up with Darletta Moll, MD On 10/14/2012. (2:40pm)    Contact information:   [address] [place] [address] [phone]       Signed: Darletta Moll 10/10/2012, 11:53 AM

## 2012-10-10 NOTE — Progress Notes (Signed)
Pt discharged home per MD order. All discharge instructions reviewed and all questions answered. Neck incision clean, dry, and intact without signs of infection.

## 2012-10-11 ENCOUNTER — Encounter (HOSPITAL_COMMUNITY): Payer: Self-pay | Admitting: Otolaryngology

## 2012-10-11 NOTE — Telephone Encounter (Signed)
[email or web address]  

## 2012-10-11 NOTE — Telephone Encounter (Signed)
I called and made pt aware. He voiced his understanding and needed nothing further.

## 2012-10-11 NOTE — Telephone Encounter (Signed)
Pt returned call and can be reached @ [phone]. Leanora Ivanoff

## 2012-10-11 NOTE — Telephone Encounter (Signed)
Pt returned call. Asks that he be called back asap this am. [name] [name]

## 2012-10-12 ENCOUNTER — Encounter (HOSPITAL_COMMUNITY): Payer: Medicare Other

## 2012-10-14 ENCOUNTER — Encounter (HOSPITAL_COMMUNITY): Payer: Medicare Other

## 2012-10-15 ENCOUNTER — Encounter (HOSPITAL_COMMUNITY): Payer: Self-pay | Admitting: Physical Medicine and Rehabilitation

## 2012-10-15 ENCOUNTER — Inpatient Hospital Stay (HOSPITAL_COMMUNITY)
Admission: EM | Admit: 2012-10-15 | Discharge: 2012-10-22 | DRG: 291 | Disposition: A | Discharge status: Home / Self Care | Payer: Medicare Other | Attending: Family Medicine | Admitting: Family Medicine

## 2012-10-15 ENCOUNTER — Emergency Department (HOSPITAL_COMMUNITY): Payer: Medicare Other

## 2012-10-15 DIAGNOSIS — E876 Hypokalemia: Secondary | ICD-10-CM | POA: Diagnosis not present

## 2012-10-15 DIAGNOSIS — N289 Disorder of kidney and ureter, unspecified: Secondary | ICD-10-CM | POA: Diagnosis not present

## 2012-10-15 DIAGNOSIS — M4000 Postural kyphosis, site unspecified: Secondary | ICD-10-CM | POA: Diagnosis present

## 2012-10-15 DIAGNOSIS — Z8042 Family history of malignant neoplasm of prostate: Secondary | ICD-10-CM

## 2012-10-15 DIAGNOSIS — Z9981 Dependence on supplemental oxygen: Secondary | ICD-10-CM

## 2012-10-15 DIAGNOSIS — I70219 Atherosclerosis of native arteries of extremities with intermittent claudication, unspecified extremity: Secondary | ICD-10-CM

## 2012-10-15 DIAGNOSIS — Z9861 Coronary angioplasty status: Secondary | ICD-10-CM

## 2012-10-15 DIAGNOSIS — I252 Old myocardial infarction: Secondary | ICD-10-CM

## 2012-10-15 DIAGNOSIS — R0902 Hypoxemia: Secondary | ICD-10-CM

## 2012-10-15 DIAGNOSIS — Z85850 Personal history of malignant neoplasm of thyroid: Secondary | ICD-10-CM

## 2012-10-15 DIAGNOSIS — E236 Other disorders of pituitary gland: Secondary | ICD-10-CM | POA: Diagnosis present

## 2012-10-15 DIAGNOSIS — I4729 Other ventricular tachycardia: Secondary | ICD-10-CM

## 2012-10-15 DIAGNOSIS — Z7982 Long term (current) use of aspirin: Secondary | ICD-10-CM

## 2012-10-15 DIAGNOSIS — I213 ST elevation (STEMI) myocardial infarction of unspecified site: Secondary | ICD-10-CM

## 2012-10-15 DIAGNOSIS — J441 Chronic obstructive pulmonary disease with (acute) exacerbation: Secondary | ICD-10-CM

## 2012-10-15 DIAGNOSIS — I739 Peripheral vascular disease, unspecified: Secondary | ICD-10-CM

## 2012-10-15 DIAGNOSIS — R918 Other nonspecific abnormal finding of lung field: Secondary | ICD-10-CM

## 2012-10-15 DIAGNOSIS — J40 Bronchitis, not specified as acute or chronic: Secondary | ICD-10-CM

## 2012-10-15 DIAGNOSIS — I2589 Other forms of chronic ischemic heart disease: Secondary | ICD-10-CM | POA: Diagnosis present

## 2012-10-15 DIAGNOSIS — I5189 Other ill-defined heart diseases: Secondary | ICD-10-CM

## 2012-10-15 DIAGNOSIS — T380X5A Adverse effect of glucocorticoids and synthetic analogues, initial encounter: Secondary | ICD-10-CM | POA: Diagnosis not present

## 2012-10-15 DIAGNOSIS — I472 Ventricular tachycardia: Secondary | ICD-10-CM

## 2012-10-15 DIAGNOSIS — I5041 Acute combined systolic (congestive) and diastolic (congestive) heart failure: Secondary | ICD-10-CM

## 2012-10-15 DIAGNOSIS — J9620 Acute and chronic respiratory failure, unspecified whether with hypoxia or hypercapnia: Secondary | ICD-10-CM

## 2012-10-15 DIAGNOSIS — Z720 Tobacco use: Secondary | ICD-10-CM

## 2012-10-15 DIAGNOSIS — Z823 Family history of stroke: Secondary | ICD-10-CM

## 2012-10-15 DIAGNOSIS — E873 Alkalosis: Secondary | ICD-10-CM | POA: Diagnosis present

## 2012-10-15 DIAGNOSIS — E878 Other disorders of electrolyte and fluid balance, not elsewhere classified: Secondary | ICD-10-CM | POA: Diagnosis not present

## 2012-10-15 DIAGNOSIS — M1990 Unspecified osteoarthritis, unspecified site: Secondary | ICD-10-CM

## 2012-10-15 DIAGNOSIS — I5043 Acute on chronic combined systolic (congestive) and diastolic (congestive) heart failure: Principal | ICD-10-CM

## 2012-10-15 DIAGNOSIS — J9600 Acute respiratory failure, unspecified whether with hypoxia or hypercapnia: Secondary | ICD-10-CM

## 2012-10-15 DIAGNOSIS — J90 Pleural effusion, not elsewhere classified: Secondary | ICD-10-CM

## 2012-10-15 DIAGNOSIS — E872 Acidosis, unspecified: Secondary | ICD-10-CM

## 2012-10-15 DIAGNOSIS — Z79899 Other long term (current) drug therapy: Secondary | ICD-10-CM

## 2012-10-15 DIAGNOSIS — R4182 Altered mental status, unspecified: Secondary | ICD-10-CM

## 2012-10-15 DIAGNOSIS — Z8673 Personal history of transient ischemic attack (TIA), and cerebral infarction without residual deficits: Secondary | ICD-10-CM

## 2012-10-15 DIAGNOSIS — C73 Malignant neoplasm of thyroid gland: Secondary | ICD-10-CM

## 2012-10-15 DIAGNOSIS — K439 Ventral hernia without obstruction or gangrene: Secondary | ICD-10-CM

## 2012-10-15 DIAGNOSIS — I480 Paroxysmal atrial fibrillation: Secondary | ICD-10-CM

## 2012-10-15 DIAGNOSIS — I10 Essential (primary) hypertension: Secondary | ICD-10-CM

## 2012-10-15 DIAGNOSIS — R5381 Other malaise: Secondary | ICD-10-CM | POA: Diagnosis present

## 2012-10-15 DIAGNOSIS — I2510 Atherosclerotic heart disease of native coronary artery without angina pectoris: Secondary | ICD-10-CM

## 2012-10-15 DIAGNOSIS — Y9210 Unspecified residential institution as the place of occurrence of the external cause: Secondary | ICD-10-CM | POA: Diagnosis not present

## 2012-10-15 DIAGNOSIS — I255 Ischemic cardiomyopathy: Secondary | ICD-10-CM

## 2012-10-15 DIAGNOSIS — E785 Hyperlipidemia, unspecified: Secondary | ICD-10-CM

## 2012-10-15 DIAGNOSIS — I2789 Other specified pulmonary heart diseases: Secondary | ICD-10-CM | POA: Diagnosis present

## 2012-10-15 DIAGNOSIS — I509 Heart failure, unspecified: Secondary | ICD-10-CM

## 2012-10-15 DIAGNOSIS — J3489 Other specified disorders of nose and nasal sinuses: Secondary | ICD-10-CM | POA: Diagnosis not present

## 2012-10-15 DIAGNOSIS — K1370 Unspecified lesions of oral mucosa: Secondary | ICD-10-CM | POA: Diagnosis not present

## 2012-10-15 DIAGNOSIS — Z9189 Other specified personal risk factors, not elsewhere classified: Secondary | ICD-10-CM

## 2012-10-15 DIAGNOSIS — Z87891 Personal history of nicotine dependence: Secondary | ICD-10-CM

## 2012-10-15 DIAGNOSIS — G9349 Other encephalopathy: Secondary | ICD-10-CM

## 2012-10-15 DIAGNOSIS — Z888 Allergy status to other drugs, medicaments and biological substances status: Secondary | ICD-10-CM

## 2012-10-15 DIAGNOSIS — J449 Chronic obstructive pulmonary disease, unspecified: Secondary | ICD-10-CM

## 2012-10-15 DIAGNOSIS — F19921 Other psychoactive substance use, unspecified with intoxication with delirium: Secondary | ICD-10-CM | POA: Diagnosis not present

## 2012-10-15 DIAGNOSIS — IMO0002 Reserved for concepts with insufficient information to code with codable children: Secondary | ICD-10-CM

## 2012-10-15 DIAGNOSIS — Z7902 Long term (current) use of antithrombotics/antiplatelets: Secondary | ICD-10-CM

## 2012-10-15 DIAGNOSIS — I3139 Other pericardial effusion (noninflammatory): Secondary | ICD-10-CM

## 2012-10-15 DIAGNOSIS — E871 Hypo-osmolality and hyponatremia: Secondary | ICD-10-CM

## 2012-10-15 DIAGNOSIS — I313 Pericardial effusion (noninflammatory): Secondary | ICD-10-CM

## 2012-10-15 LAB — CBC WITH DIFFERENTIAL/PLATELET
Basophils Absolute: 0 10*3/uL (ref 0.0–0.1)
Basophils Relative: 0 % (ref 0–1)
Eosinophils Absolute: 0.5 10*3/uL (ref 0.0–0.7)
Eosinophils Relative: 4 % (ref 0–5)
Lymphs Abs: 1.6 10*3/uL (ref 0.7–4.0)
MCH: 29.2 pg (ref 26.0–34.0)
MCHC: 35.7 g/dL (ref 30.0–36.0)
MCV: 81.7 fL (ref 78.0–100.0)
Neutrophils Relative %: 75 % (ref 43–77)
Platelets: 271 10*3/uL (ref 150–400)
RDW: 14.6 % (ref 11.5–15.5)

## 2012-10-15 LAB — BASIC METABOLIC PANEL
Calcium: 6.6 mg/dL — ABNORMAL LOW (ref 8.4–10.5)
GFR calc Af Amer: 63 mL/min — ABNORMAL LOW (ref >90)
GFR calc non Af Amer: 55 mL/min — ABNORMAL LOW (ref >90)
Glucose, Bld: 103 mg/dL — ABNORMAL HIGH (ref 70–99)
Potassium: 3.1 mEq/L — ABNORMAL LOW (ref 3.5–5.1)
Sodium: 125 mEq/L — ABNORMAL LOW (ref 135–145)

## 2012-10-15 LAB — POCT I-STAT 3, ART BLOOD GAS (G3+)
Acid-Base Excess: 17 mmol/L — ABNORMAL HIGH (ref 0.0–2.0)
O2 Saturation: 96 %
Patient temperature: 98.6
TCO2: 43 mmol/L (ref 0–100)

## 2012-10-15 LAB — CBC
MCH: 28.6 pg (ref 26.0–34.0)
MCHC: 29.9 g/dL — ABNORMAL LOW (ref 30.0–36.0)
MCV: 95.5 fL (ref 78.0–100.0)
Platelets: 267 10*3/uL (ref 150–400)
RBC: 3.78 MIL/uL — ABNORMAL LOW (ref 4.22–5.81)

## 2012-10-15 LAB — CREATININE, SERUM: Creatinine, Ser: 1.28 mg/dL (ref 0.50–1.35)

## 2012-10-15 MED ORDER — FUROSEMIDE 10 MG/ML IJ SOLN
80.0000 mg | Freq: Once | INTRAMUSCULAR | Status: AC
  Administered 2012-10-15: 80 mg via INTRAVENOUS
  Filled 2012-10-15: qty 8

## 2012-10-15 MED ORDER — ISOSORBIDE MONONITRATE ER 30 MG PO TB24
30.0000 mg | ORAL_TABLET | Freq: Every day | ORAL | Status: DC
  Administered 2012-10-16 – 2012-10-22 (×7): 30 mg via ORAL
  Filled 2012-10-15 (×7): qty 1

## 2012-10-15 MED ORDER — CLINDAMYCIN HCL 300 MG PO CAPS
300.0000 mg | ORAL_CAPSULE | Freq: Four times a day (QID) | ORAL | Status: DC
  Administered 2012-10-16 – 2012-10-22 (×25): 300 mg via ORAL
  Filled 2012-10-15 (×30): qty 1

## 2012-10-15 MED ORDER — CALCIUM CARBONATE 1250 (500 CA) MG PO TABS
1.0000 | ORAL_TABLET | Freq: Four times a day (QID) | ORAL | Status: DC
  Administered 2012-10-16 (×2): 500 mg via ORAL
  Filled 2012-10-15 (×6): qty 1

## 2012-10-15 MED ORDER — LOSARTAN POTASSIUM 25 MG PO TABS
25.0000 mg | ORAL_TABLET | Freq: Every day | ORAL | Status: DC
  Administered 2012-10-16 – 2012-10-19 (×4): 25 mg via ORAL
  Filled 2012-10-15 (×4): qty 1

## 2012-10-15 MED ORDER — GABAPENTIN 600 MG PO TABS
600.0000 mg | ORAL_TABLET | Freq: Two times a day (BID) | ORAL | Status: DC
  Administered 2012-10-16 – 2012-10-22 (×13): 600 mg via ORAL
  Filled 2012-10-15 (×16): qty 1

## 2012-10-15 MED ORDER — POTASSIUM CHLORIDE CRYS ER 10 MEQ PO TBCR
10.0000 meq | EXTENDED_RELEASE_TABLET | Freq: Every day | ORAL | Status: DC
  Administered 2012-10-15 – 2012-10-22 (×8): 10 meq via ORAL
  Filled 2012-10-15 (×8): qty 1

## 2012-10-15 MED ORDER — LEVOFLOXACIN IN D5W 500 MG/100ML IV SOLN
500.0000 mg | INTRAVENOUS | Status: DC
  Administered 2012-10-15 – 2012-10-16 (×2): 500 mg via INTRAVENOUS
  Filled 2012-10-15 (×3): qty 100

## 2012-10-15 MED ORDER — OXYCODONE-ACETAMINOPHEN 5-325 MG PO TABS
1.0000 | ORAL_TABLET | ORAL | Status: DC | PRN
  Administered 2012-10-16: 2 via ORAL
  Administered 2012-10-17 – 2012-10-19 (×3): 1 via ORAL
  Administered 2012-10-20 – 2012-10-21 (×2): 2 via ORAL
  Filled 2012-10-15: qty 1
  Filled 2012-10-15: qty 2
  Filled 2012-10-15: qty 1
  Filled 2012-10-15 (×2): qty 2
  Filled 2012-10-15: qty 1

## 2012-10-15 MED ORDER — ASPIRIN EC 81 MG PO TBEC
81.0000 mg | DELAYED_RELEASE_TABLET | Freq: Every day | ORAL | Status: DC
  Administered 2012-10-16 – 2012-10-22 (×7): 81 mg via ORAL
  Filled 2012-10-15 (×7): qty 1

## 2012-10-15 MED ORDER — FUROSEMIDE 10 MG/ML IJ SOLN
40.0000 mg | Freq: Two times a day (BID) | INTRAMUSCULAR | Status: DC
  Administered 2012-10-16 – 2012-10-18 (×5): 40 mg via INTRAVENOUS
  Filled 2012-10-15 (×8): qty 4

## 2012-10-15 MED ORDER — NITROGLYCERIN 0.4 MG SL SUBL: 0.4000 mg | SUBLINGUAL_TABLET | SUBLINGUAL | Status: DC | PRN

## 2012-10-15 MED ORDER — LEVOTHYROXINE SODIUM 100 MCG PO TABS
100.0000 ug | ORAL_TABLET | Freq: Every day | ORAL | Status: DC
  Administered 2012-10-16 – 2012-10-22 (×7): 100 ug via ORAL
  Filled 2012-10-15 (×9): qty 1

## 2012-10-15 MED ORDER — SODIUM CHLORIDE 0.9 % IJ SOLN
3.0000 mL | Freq: Two times a day (BID) | INTRAMUSCULAR | Status: DC
  Administered 2012-10-16 – 2012-10-22 (×13): 3 mL via INTRAVENOUS

## 2012-10-15 MED ORDER — PANTOPRAZOLE SODIUM 40 MG PO TBEC
40.0000 mg | DELAYED_RELEASE_TABLET | Freq: Every day | ORAL | Status: DC
  Administered 2012-10-16 – 2012-10-21 (×6): 40 mg via ORAL
  Filled 2012-10-15 (×6): qty 1

## 2012-10-15 MED ORDER — HEPARIN SODIUM (PORCINE) 5000 UNIT/ML IJ SOLN
5000.0000 [IU] | Freq: Three times a day (TID) | INTRAMUSCULAR | Status: DC
  Administered 2012-10-16 – 2012-10-22 (×19): 5000 [IU] via SUBCUTANEOUS
  Filled 2012-10-15 (×23): qty 1

## 2012-10-15 MED ORDER — METHYLPREDNISOLONE SODIUM SUCC 125 MG IJ SOLR
125.0000 mg | Freq: Three times a day (TID) | INTRAMUSCULAR | Status: DC
  Administered 2012-10-15 – 2012-10-16 (×3): 125 mg via INTRAVENOUS
  Filled 2012-10-15 (×5): qty 2

## 2012-10-15 MED ORDER — CLOPIDOGREL BISULFATE 75 MG PO TABS
75.0000 mg | ORAL_TABLET | Freq: Every day | ORAL | Status: DC
  Administered 2012-10-16 – 2012-10-22 (×7): 75 mg via ORAL
  Filled 2012-10-15 (×11): qty 1

## 2012-10-15 MED ORDER — METOPROLOL SUCCINATE ER 25 MG PO TB24
25.0000 mg | ORAL_TABLET | Freq: Every day | ORAL | Status: DC
  Administered 2012-10-16 – 2012-10-22 (×7): 25 mg via ORAL
  Filled 2012-10-15 (×7): qty 1

## 2012-10-15 NOTE — ED Notes (Signed)
Patient transported to XR. 

## 2012-10-15 NOTE — ED Notes (Signed)
Pt presents to department for evaluation of SOB. Ongoing x1 day. History of CHF, daughter reports 5lb weight gain. Expiratory wheezing and rhonchi heard all lung fields upon arrival. States SOB becomes worse with exertion. Pt is alert and oriented x4.

## 2012-10-15 NOTE — ED Provider Notes (Signed)
History     CSN: [id number]  Arrival date & time 10/15/12  1819   First MD Initiated Contact with Patient 10/15/12 1837      Chief Complaint  Patient presents with  . Shortness of Breath    (Consider location/radiation/quality/duration/timing/severity/associated sxs/prior treatment) HPI Comments: Patient comes to the ER for evaluation of shortness of breath. He has had progressively worsening shortness of breath for one to 2 days. He reports an associated cough, productive of thick sputum. He has not had fever. He has had some mild improvement with albuterol. He did take a nitroglycerin earlier today because of a history of congestive heart failure. He denies chest pain at the time. He says that the nitroglycerin did not help his breathing. Daughter volunteers that he has had a 35 pound waking over the last several days. He has not noticed any leg swelling, but often times when he is in congestive heart failure swelling either.  Patient did undergo thyroidectomy this past week. It is healing appropriately, no drainage.  Patient is a 72 y.o. male presenting with shortness of breath.  Shortness of Breath Associated symptoms: cough     Past Medical History  Diagnosis Date  . Hypertension   . Hernia   . Hyperlipidemia   . Peripheral vascular disease   . Shoulder fracture, left   . At risk for sudden cardiac death, due to [facility] 23-Apr-2012  . CHF (congestive heart failure)   . Cancer     skin cancer  . Shortness of breath   . Stroke 2004    minor  . Dysrhythmia     PAF  . COPD (chronic obstructive pulmonary disease)     PT DENIES USES O2 2 LITERS HS  . Heart murmur   . Hepatitis     AT AGE  56; unknown type    . Heart attack     03/17/12; subtotally occluded RCI with unsucessful PCI  . Coronary artery disease     Cardiologist is Dr. Nanetta Batty     Past Surgical History  Procedure Laterality Date  . Bypass graft  07/2010    axillobifemoral BPG  . Spine surgery  11/23/06     microdiscectomy L5-S1  . Hemorrhoid surgery    . Tonsillectomy    . Hernia repair  02/09/11    [facility]  . Cardiac catheterization      2013  . Coronary angioplasty      2013  . Thyroidectomy Bilateral 10/07/2012    Procedure: THYROIDECTOMY;  Surgeon: Darletta Moll, MD;  Location: [facility] OR;  Service: ENT;  Laterality: Bilateral;  . Radical neck dissection Left 10/07/2012    Procedure: RADICAL NECK DISSECTION;  Surgeon: Darletta Moll, MD;  Location: [facility] OR;  Service: ENT;  Laterality: Left;    Family History  Problem Relation Age of Onset  . Alzheimer's disease Mother   . Cancer Father     prostate  . Cancer Brother     prostate  . Stroke Brother     History  Substance Use Topics  . Smoking status: Current Some Day Smoker -- 1.00 packs/day for 50 years    Types: Cigarettes    Last Attempt to Quit: 03/16/2012  . Smokeless tobacco: Never Used  . Alcohol Use: 4.2 oz/week    7 Cans of beer per week     Comment: 2 drinks per day      Review of Systems  Respiratory: Positive for cough and shortness of breath.  All other systems reviewed and are negative.    Allergies  Ceclor  Home Medications   Current Outpatient Rx  Name  Route  Sig  Dispense  Refill  . calcium carbonate (OS-CAL - DOSED IN MG OF ELEMENTAL CALCIUM) 1250 MG tablet   Oral   Take 1 tablet (500 mg of elemental calcium total) by mouth 4 (four) times daily.   120 tablet   5   . furosemide (LASIX) 40 MG tablet   Oral   Take 40 mg by mouth 2 (two) times daily.         Marland Kitchen gabapentin (NEURONTIN) 600 MG tablet   Oral   Take 600 mg by mouth 2 (two) times daily.          . isosorbide mononitrate (IMDUR) 30 MG 24 hr tablet   Oral   Take 1 tablet (30 mg total) by mouth daily.   30 tablet   6   . levothyroxine (SYNTHROID) 100 MCG tablet   Oral   Take 1 tablet (100 mcg total) by mouth daily before breakfast.   30 tablet   11   . losartan (COZAAR) 25 MG tablet   Oral   Take 1 tablet (25 mg total) by  mouth daily.   30 tablet   5   . metoprolol succinate (TOPROL-XL) 25 MG 24 hr tablet   Oral   Take 25 mg by mouth daily.         . nitroGLYCERIN (NITROSTAT) 0.4 MG SL tablet   Sublingual   Place 1 tablet (0.4 mg total) under the tongue every 5 (five) minutes x 3 doses as needed for chest pain.   25 tablet   4   . oxyCODONE-acetaminophen (PERCOCET/ROXICET) 5-325 MG per tablet   Oral   Take 1-2 tablets by mouth every 4 (four) hours as needed for pain.   40 tablet   0   . pantoprazole (PROTONIX) 40 MG tablet   Oral   Take 1 tablet (40 mg total) by mouth daily at 12 noon.   30 tablet   2   . potassium chloride (K-DUR,KLOR-CON) 10 MEQ tablet   Oral   Take 10 mEq by mouth daily.           BP 140/90  Pulse 73  Temp(Src) 97.9 F (36.6 C) (Oral)  Resp 24  SpO2 92%  Physical Exam  Constitutional: He is oriented to person, place, and time. He appears well-developed and well-nourished. No distress.  HENT:  Head: Normocephalic and atraumatic.  Right Ear: Hearing normal.  Nose: Nose normal.  Mouth/Throat: Oropharynx is clear and moist and mucous membranes are normal.  Eyes: Conjunctivae and EOM are normal. Pupils are equal, round, and reactive to light.  Neck: Normal range of motion. Neck supple.  Cardiovascular: Normal rate, regular rhythm, S1 normal and S2 normal.  Exam reveals no gallop and no friction rub.   No murmur heard. Pulmonary/Chest: Effort normal. No accessory muscle usage. Not tachypneic. No respiratory distress. He has decreased breath sounds in the right lower field and the left lower field. He has wheezes in the right lower field and the left lower field. He has no rhonchi. He has rales in the right lower field and the left lower field. He exhibits no tenderness.  Abdominal: Soft. Normal appearance and bowel sounds are normal. There is no hepatosplenomegaly. There is no tenderness. There is no rebound, no guarding, no tenderness at McBurney's point and  negative Murphy's sign. No hernia.  Musculoskeletal: Normal range of motion.  Neurological: He is alert and oriented to person, place, and time. He has normal strength. No cranial nerve deficit or sensory deficit. Coordination normal. GCS eye subscore is 4. GCS verbal subscore is 5. GCS motor subscore is 6.  Skin: Skin is warm, dry and intact. No rash noted. No cyanosis.  Healing transverse incision over thyroid area, no erythema, drainage or warmth  Psychiatric: He has a normal mood and affect. His speech is normal and behavior is normal. Thought content normal.    ED Course  Procedures (including critical care time)  EKG:  Date: 10/15/2012  Rate: 72  Rhythm: normal sinus rhythm  QRS Axis: left  Intervals: normal  ST/T Wave abnormalities: normal  Conduction Disutrbances:none  Narrative Interpretation:   Old EKG Reviewed: unchanged    Labs Reviewed  PRO B NATRIURETIC PEPTIDE - Abnormal; Notable for the following:    Pro B Natriuretic peptide (BNP) 13354.0 (*)    All other components within normal limits  CBC WITH DIFFERENTIAL - Abnormal; Notable for the following:    WBC 13.9 (*)    RBC 3.67 (*)    Hemoglobin 10.7 (*)    HCT 30.0 (*)    Neutro Abs 10.4 (*)    Monocytes Absolute 1.4 (*)    All other components within normal limits  BASIC METABOLIC PANEL - Abnormal; Notable for the following:    Sodium 125 (*)    Potassium 3.1 (*)    Chloride 78 (*)    CO2 37 (*)    Glucose, Bld 103 (*)    Calcium 6.6 (*)    GFR calc non Af Amer 55 (*)    GFR calc Af Amer 63 (*)    All other components within normal limits  TROPONIN I   Dg Chest 2 View  10/15/2012  *RADIOLOGY REPORT*  Clinical Data: Shortness of breath, productive cough  CHEST - 2 VIEW  Comparison: 10/04/2012  Findings: Left apical clips again noted.  The patient is rotated to the right.  Right axillary presumed vascular calcifications noted. Moderate enlargement cardiomediastinal silhouette is noted.  Trace bilateral  pleural effusions are increased.  A few new peripheral Kerley B lines are identified.  Thoracic spine compression deformities are again noted.  IMPRESSION: Cardiomegaly with increased pleural effusions and interstitial Kerley B lines which may indicate developing interstitial pulmonary edema.   Original Report Authenticated By: Christiana Pellant, M.D.      Diagnosis: 1. Congestive heart failure 2. Hyponatremia    MDM  She comes to the ER for evaluation of shortness of breath. Patient has a history of congestive heart failure.symptoms have progressed over a period of a couple of days. He has had an associated cough with this. Family reports, however, that when he has congestive heart failure, he doesn't always have significant pulmonary findings. As his blood pressure is running essentially normal or slightly high. His oxygen saturations are around 90%.  Workup is suggestive of congestive heart failure.he has evidence of congestive heart failure on his chest x-ray and his BNP is significantly elevated from his baseline. Patient is to IV Lasix here in the ER. Additional findings as above with hyponatremia. Patient has a history of chronic hyponatremia, it normally runs 07/22/1930. Sodium was 125 today. Patient's EKG did not show any acute ischemia. Troponin was negative.  Case discussed with Dr. Tresa Endo, on call for [facility]. He agrees with IV Lasix, recommends admission to the Hospitalist.  Gilda Crease, MD 10/15/12 2039

## 2012-10-16 ENCOUNTER — Encounter (HOSPITAL_COMMUNITY): Payer: Self-pay | Admitting: Family

## 2012-10-16 DIAGNOSIS — J441 Chronic obstructive pulmonary disease with (acute) exacerbation: Secondary | ICD-10-CM

## 2012-10-16 DIAGNOSIS — C73 Malignant neoplasm of thyroid gland: Secondary | ICD-10-CM

## 2012-10-16 DIAGNOSIS — I509 Heart failure, unspecified: Secondary | ICD-10-CM | POA: Diagnosis present

## 2012-10-16 DIAGNOSIS — I2510 Atherosclerotic heart disease of native coronary artery without angina pectoris: Secondary | ICD-10-CM

## 2012-10-16 DIAGNOSIS — J9620 Acute and chronic respiratory failure, unspecified whether with hypoxia or hypercapnia: Secondary | ICD-10-CM

## 2012-10-16 DIAGNOSIS — I5021 Acute systolic (congestive) heart failure: Secondary | ICD-10-CM

## 2012-10-16 LAB — COMPREHENSIVE METABOLIC PANEL
Alkaline Phosphatase: 83 U/L (ref 39–117)
BUN: 13 mg/dL (ref 6–23)
Calcium: 6.9 mg/dL — ABNORMAL LOW (ref 8.4–10.5)
GFR calc Af Amer: 70 mL/min — ABNORMAL LOW (ref >90)
GFR calc non Af Amer: 60 mL/min — ABNORMAL LOW (ref >90)
Glucose, Bld: 129 mg/dL — ABNORMAL HIGH (ref 70–99)
Potassium: 4.5 mEq/L (ref 3.5–5.1)
Total Protein: 7.6 g/dL (ref 6.0–8.3)

## 2012-10-16 LAB — TROPONIN I
Troponin I: <0.3 ng/mL (ref <0.30)
Troponin I: <0.3 ng/mL (ref <0.30)

## 2012-10-16 MED ORDER — SODIUM CHLORIDE 0.9 % IV SOLN
1.0000 g | Freq: Once | INTRAVENOUS | Status: AC
  Administered 2012-10-16: 1 g via INTRAVENOUS
  Filled 2012-10-16: qty 10

## 2012-10-16 MED ORDER — ALBUTEROL SULFATE (5 MG/ML) 0.5% IN NEBU
2.5000 mg | INHALATION_SOLUTION | RESPIRATORY_TRACT | Status: DC | PRN
  Administered 2012-10-19: 2.5 mg via RESPIRATORY_TRACT
  Filled 2012-10-16: qty 0.5

## 2012-10-16 MED ORDER — LIVING BETTER WITH HEART FAILURE BOOK
Freq: Once | Status: AC
  Administered 2012-10-17: 12:00:00
  Filled 2012-10-16 (×3): qty 1

## 2012-10-16 MED ORDER — RANOLAZINE ER 500 MG PO TB12
500.0000 mg | ORAL_TABLET | Freq: Two times a day (BID) | ORAL | Status: DC
  Administered 2012-10-16 – 2012-10-22 (×13): 500 mg via ORAL
  Filled 2012-10-16 (×14): qty 1

## 2012-10-16 MED ORDER — METHYLPREDNISOLONE SODIUM SUCC 125 MG IJ SOLR
40.0000 mg | Freq: Three times a day (TID) | INTRAMUSCULAR | Status: DC
  Filled 2012-10-16 (×2): qty 0.64

## 2012-10-16 MED ORDER — METHYLPREDNISOLONE SODIUM SUCC 40 MG IJ SOLR
40.0000 mg | Freq: Three times a day (TID) | INTRAMUSCULAR | Status: DC
  Administered 2012-10-16 – 2012-10-17 (×2): 40 mg via INTRAVENOUS
  Filled 2012-10-16 (×4): qty 1

## 2012-10-16 MED ORDER — CALCIUM CARBONATE 1250 (500 CA) MG PO TABS
1.0000 | ORAL_TABLET | Freq: Three times a day (TID) | ORAL | Status: DC
  Administered 2012-10-16 – 2012-10-19 (×8): 500 mg via ORAL
  Filled 2012-10-16 (×12): qty 1

## 2012-10-16 MED ORDER — POTASSIUM CHLORIDE 10 MEQ/100ML IV SOLN
10.0000 meq | INTRAVENOUS | Status: AC
  Administered 2012-10-16 (×4): 10 meq via INTRAVENOUS
  Filled 2012-10-16: qty 400

## 2012-10-16 NOTE — Progress Notes (Signed)
Utilization Review Completed.   Delores Thelen, RN, BSN Nurse Case Manager  [phone]  

## 2012-10-16 NOTE — Consult Note (Signed)
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 Eldridge Dace who was unable to revascularize the dominant right coronary. This was complicated by atrial fibrillation and heart failure. He was briefly on amiodarone and diuresed. He also has a history of oxygen dependent COPD, peripheral vascular disease and is status post left axillary-femoral and fem-fem bypass grafting in 2002. He also has hypertension, dyslipidemia, history of tobacco abuse, history of CVA, ischemic cardiomyopathy with an ejection fraction of 30-35% by echocardiogram on October and briefly wore a LifeVest, and then in November of 2013 the echocardiogram revealed an EF of 35-40%. LifeVest was discontinued. Also on June 08, 2012 he had carotid Dopplers which revealed a high-grade left common carotid artery stenosis which was known and correlates to a 70% stenosis by CT angiography. He is s/p thyroidectomy approx 1 week ago for thyroid cancer.  He presented to [facility] on 10/15/12 with a complaint of progressively worsening SOB, over the course of 5-6 weeks. Apparently, the patient checked his O2 sats at home yesterdady and was in the low 80s. He uses supplemental O2 at night for his COPD but reports higher oxygen requirements in the last few weeks. No PND/orthopnea. He reports a 4 lb weight gain in the past week. No LEE. He reports both medical and dietary compliance. He denies chest pain. He endorses chest congestion and a cough, but is unable to produce sputum. No fever, chills n/v. In the ED, his BNP was elevated at 13,354.0. Troponin was negative x 2. EKG showed NSR and no acute changes. His CXR was suggestive of CHF with cardiomegaly,  increased pleural effusions and interstitial Kerley B lines.  Due to his signs/symptoms, the patient's SOB was felt to be multifactorial  with CHF and CODP. He was admitted by [facility] and placed on 40 mg IV Lasix and Solumedrol and antibiotics for suspected COPD exacerbation. He now reports noticeable improvement in symptoms.   Past Medical History  Diagnosis Date  . Hypertension   . Hernia   . Hyperlipidemia   . Peripheral vascular disease   . Shoulder fracture, left   . At risk for sudden cardiac death, due to [facility] Apr 02, 2012  . CHF (congestive heart failure)   . Cancer     skin cancer  . Shortness of breath   . Stroke 2004    minor  . Dysrhythmia     PAF  . COPD (chronic obstructive pulmonary disease)     PT DENIES USES O2 2 LITERS HS  . Heart murmur   . Hepatitis     AT AGE  28; unknown type    . Heart attack     03/17/12; subtotally occluded RCI with unsucessful PCI  . Coronary artery disease     Cardiologist is Dr. Nanetta Batty     Past Surgical History  Procedure Laterality Date  . Bypass graft  07/2010    axillobifemoral BPG  . Spine surgery  11/23/06    microdiscectomy L5-S1  . Hemorrhoid surgery    . Tonsillectomy    . Hernia repair  02/09/11    [facility]  . Cardiac catheterization      2013  . Coronary angioplasty      2013  . Thyroidectomy Bilateral 10/07/2012    Procedure: THYROIDECTOMY;  Surgeon: Darletta Moll, MD;  Location: [facility] OR;  Service:  ENT;  Laterality: Bilateral;  . Radical neck dissection Left 10/07/2012    Procedure: RADICAL NECK DISSECTION;  Surgeon: Darletta Moll, MD;  Location: [facility] OR;  Service: ENT;  Laterality: Left;    Family History  Problem Relation Age of Onset  . Alzheimer's disease Mother   . Cancer Father     prostate  . Cancer Brother     prostate  . Stroke Brother     Social History:  reports that he has been smoking Cigarettes.  He has a 50 pack-year smoking history. He has never used smokeless tobacco. He reports that he drinks about 4.2 ounces of alcohol per week. He reports that he does not use illicit drugs.  Allergies:  Allergies  Allergen Reactions  . Ceclor  (Cefaclor) Anaphylaxis    Recently took pcn for tooth problems, without issues  . Morphine And Related Other (See Comments)    Abnormal behavior    Medications:  Prior to Admission:  Prescriptions prior to admission  Medication Sig Dispense Refill  . aspirin EC 81 MG tablet Take 81 mg by mouth daily.      . calcium carbonate (OS-CAL - DOSED IN MG OF ELEMENTAL CALCIUM) 1250 MG tablet Take 1 tablet (500 mg of elemental calcium total) by mouth 4 (four) times daily.  120 tablet  5  . clindamycin (CLEOCIN) 300 MG capsule Take 300 mg by mouth 4 (four) times daily.      . clopidogrel (PLAVIX) 75 MG tablet Take 75 mg by mouth daily.      . furosemide (LASIX) 40 MG tablet Take 40 mg by mouth 2 (two) times daily. May take 1 additional dose if needed for swelling      . gabapentin (NEURONTIN) 600 MG tablet Take 600 mg by mouth 2 (two) times daily.       . isosorbide mononitrate (IMDUR) 30 MG 24 hr tablet Take 1 tablet (30 mg total) by mouth daily.  30 tablet  6  . levothyroxine (SYNTHROID) 100 MCG tablet Take 1 tablet (100 mcg total) by mouth daily before breakfast.  30 tablet  11  . losartan (COZAAR) 25 MG tablet Take 1 tablet (25 mg total) by mouth daily.  30 tablet  5  . metoprolol succinate (TOPROL-XL) 25 MG 24 hr tablet Take 25 mg by mouth daily.      . nitroGLYCERIN (NITROSTAT) 0.4 MG SL tablet Place 1 tablet (0.4 mg total) under the tongue every 5 (five) minutes x 3 doses as needed for chest pain.  25 tablet  4  . oxyCODONE-acetaminophen (PERCOCET/ROXICET) 5-325 MG per tablet Take 1-2 tablets by mouth every 4 (four) hours as needed for pain.  40 tablet  0  . pantoprazole (PROTONIX) 40 MG tablet Take 1 tablet (40 mg total) by mouth daily at 12 noon.  30 tablet  2  . potassium chloride (K-DUR,KLOR-CON) 10 MEQ tablet Take 10 mEq by mouth daily.        Results for orders placed during the hospital encounter of 10/15/12 (from the past 48 hour(s))  PRO B NATRIURETIC PEPTIDE     Status: Abnormal    Collection Time    10/15/12  6:33 PM      Result Value Range   Pro B Natriuretic peptide (BNP) 13354.0 (*) 0 - 125 pg/mL  CBC WITH DIFFERENTIAL     Status: Abnormal   Collection Time    10/15/12  6:33 PM      Result Value Range   WBC  13.9 (*) 4.0 - 10.5 K/uL   RBC 3.67 (*) 4.22 - 5.81 MIL/uL   Hemoglobin 10.7 (*) 13.0 - 17.0 g/dL   HCT 16.1 (*) 09.6 - 04.5 %   MCV 81.7  78.0 - 100.0 fL   MCH 29.2  26.0 - 34.0 pg   MCHC 35.7  30.0 - 36.0 g/dL   RDW 40.9  81.1 - 91.4 %   Platelets 271  150 - 400 K/uL   Neutrophils Relative 75  43 - 77 %   Neutro Abs 10.4 (*) 1.7 - 7.7 K/uL   Lymphocytes Relative 12  12 - 46 %   Lymphs Abs 1.6  0.7 - 4.0 K/uL   Monocytes Relative 10  3 - 12 %   Monocytes Absolute 1.4 (*) 0.1 - 1.0 K/uL   Eosinophils Relative 4  0 - 5 %   Eosinophils Absolute 0.5  0.0 - 0.7 K/uL   Basophils Relative 0  0 - 1 %   Basophils Absolute 0.0  0.0 - 0.1 K/uL  BASIC METABOLIC PANEL     Status: Abnormal   Collection Time    10/15/12  6:33 PM      Result Value Range   Sodium 125 (*) 135 - 145 mEq/L   Potassium 3.1 (*) 3.5 - 5.1 mEq/L   Chloride 78 (*) 96 - 112 mEq/L   CO2 37 (*) 19 - 32 mEq/L   Glucose, Bld 103 (*) 70 - 99 mg/dL   BUN 14  6 - 23 mg/dL   Creatinine, Ser 7.82  0.50 - 1.35 mg/dL   Calcium 6.6 (*) 8.4 - 10.5 mg/dL   GFR calc non Af Amer 55 (*) >90 mL/min   GFR calc Af Amer 63 (*) >90 mL/min   Comment:            The eGFR has been calculated     using the CKD EPI equation.     This calculation has not been     validated in all clinical     situations.     eGFR's persistently     <90 mL/min signify     possible Chronic Kidney Disease.  TROPONIN I     Status: None   Collection Time    10/15/12  6:40 PM      Result Value Range   Troponin I <0.30  <0.30 ng/mL   Comment:            Due to the release kinetics of cTnI,     a negative result within the first hours     of the onset of symptoms does not rule out     myocardial infarction with certainty.      If myocardial infarction is still suspected,     repeat the test at appropriate intervals.  POCT I-STAT 3, BLOOD GAS (G3+)     Status: Abnormal   Collection Time    10/15/12  9:02 PM      Result Value Range   pH, Arterial 7.550 (*) 7.350 - 7.450   pCO2 arterial 47.8 (*) 35.0 - 45.0 mmHg   pO2, Arterial 75.0 (*) 80.0 - 100.0 mmHg   Bicarbonate 41.9 (*) 20.0 - 24.0 mEq/L   TCO2 43  0 - 100 mmol/L   O2 Saturation 96.0     Acid-Base Excess 17.0 (*) 0.0 - 2.0 mmol/L   Patient temperature 98.6 F     Collection site RADIAL, ALLEN'S TEST ACCEPTABLE     Drawn  by Operator     Sample type ARTERIAL    CBC     Status: Abnormal   Collection Time    10/15/12  9:48 PM      Result Value Range   WBC 12.5 (*) 4.0 - 10.5 K/uL   RBC 3.78 (*) 4.22 - 5.81 MIL/uL   Hemoglobin 10.8 (*) 13.0 - 17.0 g/dL   HCT 40.9 (*) 81.1 - 91.4 %   MCV 95.5  78.0 - 100.0 fL   Comment: DELTA CHECK NOTED     CHECKED WITH GWENDOLYN THACKER,VP   MCH 28.6  26.0 - 34.0 pg   MCHC 29.9 (*) 30.0 - 36.0 g/dL   RDW 78.2  95.6 - 21.3 %   Platelets 267  150 - 400 K/uL  CREATININE, SERUM     Status: Abnormal   Collection Time    10/15/12  9:48 PM      Result Value Range   Creatinine, Ser 1.28  0.50 - 1.35 mg/dL   GFR calc non Af Amer 55 (*) >90 mL/min   GFR calc Af Amer 63 (*) >90 mL/min   Comment:            The eGFR has been calculated     using the CKD EPI equation.     This calculation has not been     validated in all clinical     situations.     eGFR's persistently     <90 mL/min signify     possible Chronic Kidney Disease.  TROPONIN I     Status: None   Collection Time    10/16/12 12:58 AM      Result Value Range   Troponin I <0.30  <0.30 ng/mL   Comment:            Due to the release kinetics of cTnI,     a negative result within the first hours     of the onset of symptoms does not rule out     myocardial infarction with certainty.     If myocardial infarction is still suspected,     repeat the  test at appropriate intervals.  MRSA PCR SCREENING     Status: None   Collection Time    10/16/12  1:26 AM      Result Value Range   MRSA by PCR NEGATIVE  NEGATIVE   Comment:            The GeneXpert MRSA Assay (FDA     approved for NASAL specimens     only), is one component of a     comprehensive MRSA colonization     surveillance program. It is not     intended to diagnose MRSA     infection nor to guide or     monitor treatment for     MRSA infections.  COMPREHENSIVE METABOLIC PANEL     Status: Abnormal   Collection Time    10/16/12  7:07 AM      Result Value Range   Sodium 127 (*) 135 - 145 mEq/L   Potassium 4.5  3.5 - 5.1 mEq/L   Comment: DELTA CHECK NOTED     NO VISIBLE HEMOLYSIS   Chloride 77 (*) 96 - 112 mEq/L   CO2 38 (*) 19 - 32 mEq/L   Glucose, Bld 129 (*) 70 - 99 mg/dL   BUN 13  6 - 23 mg/dL   Creatinine, Ser 0.86  0.50 - 1.35 mg/dL  Calcium 6.9 (*) 8.4 - 10.5 mg/dL   Total Protein 7.6  6.0 - 8.3 g/dL   Albumin 3.4 (*) 3.5 - 5.2 g/dL   AST 26  0 - 37 U/L   ALT 16  0 - 53 U/L   Alkaline Phosphatase 83  39 - 117 U/L   Total Bilirubin 0.4  0.3 - 1.2 mg/dL   GFR calc non Af Amer 60 (*) >90 mL/min   GFR calc Af Amer 70 (*) >90 mL/min   Comment:            The eGFR has been calculated     using the CKD EPI equation.     This calculation has not been     validated in all clinical     situations.     eGFR's persistently     <90 mL/min signify     possible Chronic Kidney Disease.  TROPONIN I     Status: None   Collection Time    10/16/12  7:07 AM      Result Value Range   Troponin I <0.30  <0.30 ng/mL   Comment:            Due to the release kinetics of cTnI,     a negative result within the first hours     of the onset of symptoms does not rule out     myocardial infarction with certainty.     If myocardial infarction is still suspected,     repeat the test at appropriate intervals.    Dg Chest 2 View  10/15/2012  *RADIOLOGY REPORT*  Clinical Data:  Shortness of breath, productive cough  CHEST - 2 VIEW  Comparison: 10/04/2012  Findings: Left apical clips again noted.  The patient is rotated to the right.  Right axillary presumed vascular calcifications noted. Moderate enlargement cardiomediastinal silhouette is noted.  Trace bilateral pleural effusions are increased.  A few new peripheral Kerley B lines are identified.  Thoracic spine compression deformities are again noted.  IMPRESSION: Cardiomegaly with increased pleural effusions and interstitial Kerley B lines which may indicate developing interstitial pulmonary edema.   Original Report Authenticated By: Christiana Pellant, M.D.     Review of Systems  Constitutional: Negative for fever, chills, weight loss (endorses 4 lb weight gain in 7 days) and diaphoresis.  HENT: Positive for congestion.   Respiratory: Positive for cough, shortness of breath and wheezing. Negative for sputum production.   Cardiovascular: Negative for chest pain, palpitations, orthopnea, leg swelling and PND.  Gastrointestinal: Negative for nausea, vomiting, abdominal pain, diarrhea, constipation, blood in stool and melena.  Genitourinary: Negative for hematuria.  Neurological: Negative for dizziness, loss of consciousness and weakness.   Blood pressure 102/78, pulse 79, temperature 97.6 F (36.4 C), temperature source Axillary, resp. rate 22, height 5\' 6"  (1.676 m), weight 63.6 kg (140 lb 3.4 oz), SpO2 96.00%. Physical Exam  Constitutional: He is oriented to person, place, and time. He appears well-developed and well-nourished. No distress.  HENT:  Head: Normocephalic and atraumatic.  Eyes: Conjunctivae and EOM are normal. Pupils are equal, round, and reactive to light.  Neck: JVD (minimal) present. Carotid bruit is not present.  ~10-12 cm H20  Cardiovascular: Normal rate and regular rhythm.  Exam reveals no gallop and no friction rub.   Murmur heard. Pulses:      Radial pulses are 2+ on the right side, and 2+ on the  left side.       Dorsalis pedis pulses  are 1+ on the right side, and 1+ on the left side.  Soft 1/6 Crescendo-decrescendo SEM @ RUSB; intermittent 2/6 HSM @ apex -- very difficult to hear due to diffuse coarse breath sounds.  Respiratory: Effort normal. No respiratory distress. He has wheezes.  Bilateral rhonchi   GI: Soft. Bowel sounds are normal. He exhibits no distension and no mass. There is no tenderness.  Musculoskeletal: Normal range of motion. He exhibits no edema.       Cervical back: He exhibits tenderness, pain and spasm.       Back:       Arms: Along the Left supraspinatus region -- diffuse trigger points.  Neurological: He is alert and oriented to person, place, and time.  Skin: Skin is warm and dry. He is not diaphoretic.  Psychiatric: His behavior is normal. His mood appears anxious.    Assessment/Plan: Principal Problem:   Acute on chronic combined systolic and diastolic congestive heart failure- EF of 35-40% via echo in 04/2012 Active Problems:   Acute on chronic congestive heart failure (most recent Echo with preserved systolic function from 12/13)   Tobacco abuse, pt quit after MI   COPD, moderate   PVD, Lt Ax-fem and fem-fem BPG 2/12, Dr Manson Passey follows   HTN (hypertension)   Dyslipidemia   Peripheral vascular disease, unspecified   Thyroid cancer   COPD exacerbation   CAD (coronary artery disease)   Plan: Pt admitted by [facility] for A/C CHF and COPD exacerbation. BNP was elevated at 13K. CXR also suggestive of CHF. On 40 mg IV Lasix BID for diuresis. Net fluid loss since admission is 1.5 L. Renal function is stable w/ SCr of 1.18. The patient endorses subjective improvement in symptoms. No LEE. Minimal JVD. No crackles appreciated on exam - just port or movement. He has rhonchi and wheezing. COPD is being treated with steroids and antibiotics.  Will recheck BNP possibly tomorrow and can consider switching back to PO Lasix if improved. Continue with strict I/Os.  Troponin negative x 2. No chest pain. EKG normal. HR stable. BP moderately elevated this am. He has not yet received morning meds. Will reassess this PM. If still elevated, may need to make medication adjustments. MD to follow.  Allayne Butcher, PA-C 10/16/2012, 12:43 PM   I have seen and evaluated the patient this AM along with Boyce Medici, PA. I agree with her findings, examination as well as impression recommendations.  The patient is well known to our service from his previous hospitalizations last fall. It did appear that his ejection fraction improved with outpatient echocardiogram to near normal range. It is itching however that the there was no inferior inferobasilar of the lateral wall motion and a malate to go along with his known infarct.  He also has a pretty extensive oxygen requiring COPD at baseline. He presented with what seems to a combination of the COPD and CHF exacerbation.  Overall he says be imprudent driving diuresis. His BNP was doubly elevated. Continue on IV Lasix for now.  I agree the hypervolemic hyponatremia, we'll need to monitor sodium levels.  His blood pressures have been labile, elevated this morning but are now in the low 100s after receiving his home medications. At this point will be for titration of medications are warranted.  He does have a known occlusion of the RCA distally, would be a good candidate for Ranexa therapy. We'll initiate this today.  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 This was treated with a heating pad and lidocaine patch during his last hospitalization. We'll be happy to order those as needed.  [facility] continue to follow along and assist with management of heart failure and blood pressure. We'll order an echocardiogram to evaluate tomorrow due to the uncertainty of his current EF and the possibility of a holosystolic murmur that suggests possible mitral  regurgitation.  Marykay Lex, M.D., M.S. [facility] [address] [place], [place]  [address]  [phone] Pager # [phone] 10/16/2012 12:43 PM

## 2012-10-16 NOTE — Progress Notes (Signed)
Pt. Up to sink with assist to brush teeth.  Pt. Currently on 3 liters O2 via N/C.  Immediately de-sated to 62% with symptoms of dizziness.  With rest, sat increased to 94th percentile within 2 minutes.  States he uses home oxygen at night only.

## 2012-10-16 NOTE — Progress Notes (Signed)
Pt. Oriented x 4, but is inappropriate at times.  Will fixate on delusions and ideations which do not exist.  (i.e. Was frantically looking for prescriptions that he "had laying in front of (him)" and did not know where they were.  He required re- orientation several times for him to realize that he truly had no prescriptions at the hospital.  Son concerned re: his "bizzare" behavior.

## 2012-10-16 NOTE — H&P (Signed)
Hospitalist Admission History and Physical  Patient name: [name] [name] Medical record number: [id number] Date of birth: [birth year] Age: 72 y.o. Gender: male  Primary Care Provider: Kaleen Mask, MD  Chief Complaint: cough, SOB History of Present Illness:This is a 72 y.o. year old male with significant past medical history of multiple medical problems including CHF (mixed), CAD s/p MI while in ICU, COPD, thyroid cancer s/p thyroid surgery approx 1 week ago  presenting with cough, SOB x 2 days. Pt currently lives at home with some assistance from [place], daughter Gloris [place]). Per report, daughter states that she found pt having persistent SOB while at home. Had O2 level checked at home with O2 sats in low 80s. Pt wears O2 at night in setting of COPD. Has had to wear consistently since yesterday to help with breathing. No CP per pt. Pt noted to have had extensive thyroidectomy approx 1 week ago with rather extensive involvement into L jugular and internal carotid (please see op note for further details). Minimal neck pain and swelling per pt/daughter. Cough has been somewhat productive. No fevers or chills. Also with wheezing. Pt denies any cigarette intake, but then reports intermittent smoking, "borrowing" cigarettes from brother. Pt denies any excess salt intake, or NSAID use.   In the ED, pt noted to have a proBNP of >13,000 and cardiomegaly and kerley b lines on CXR.   Patient Active Problem List  Diagnosis  . Ventral hernia without obstruction or gangrene  . Tobacco abuse, pt quit after MI  . Pulmonary nodules  . Atherosclerosis of native arteries of the extremities with intermittent claudication  . Bronchitis  . COPD (chronic obstructive pulmonary disease)  . Acute respiratory failure, on admit and recurrent 03/28/12  . Acute on chronic combined systolic and diastolic congestive heart failure  . Altered mental status  . Hyponatremia, acute on chronic. ? secondary to volume vs SIADH vs  drug related  . Acidosis  . Hypoxemia  . STEMI, unsuccessful RCA PCI 03/17/12  . COPD, moderate  . PVD, Lt Ax-fem and fem-fem BPG 2/12, Dr Manson Passey follows  . HTN (hypertension)  . Dyslipidemia  . History of CVA (cerebrovascular accident)  . PAF, post MI 9/13  . NSVT, post MI, bradycardia at baseline limiting beta blocker  . Ischemic cardiomyopathy, EF 30-35% by echo 03/29/12  . Other encephalopathy, Acute, secondary to hypotension and resp. failure on admit now resolved at discharge  . At risk for sudden cardiac death, due to ICM, d/c'd with lifevest  . Acute combined systolic and diastolic heart failure - in setting of Inferior STEMI  . Hypoxia - with ambulation, SaO2 to 87% on RA.; not previously on Home O2. COPD + ICM  . Hyposmolality and/or hyponatremia  . Pericardial effusion, moderate on echo 03/29/12, continues to be moderate this admit  . Diastolic dysfunction, grade 2  . Pleural effusion, tapped 1L 03/30/12, small pl effusion at d/c  . Peripheral vascular disease, unspecified  . 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 Hypocalcemia  . Malignant neoplasm of thyroid gland   Past Medical History: Past Medical History  Diagnosis Date  . Hypertension   . Hernia   . Hyperlipidemia   . Peripheral vascular disease   . Shoulder fracture, left   . At risk for sudden cardiac death, due to [facility] 04/06/12  . CHF (congestive heart failure)   . Cancer     skin cancer  . Shortness of breath   . Stroke 2004  minor  . Dysrhythmia     PAF  . COPD (chronic obstructive pulmonary disease)     PT DENIES USES O2 2 LITERS HS  . Heart murmur   . Hepatitis     AT AGE  47; unknown type    . Heart attack     03/17/12; subtotally occluded RCI with unsucessful PCI  . Coronary artery disease     Cardiologist is Dr. Nanetta Batty     Past Surgical History: Past Surgical History  Procedure Laterality Date  . Bypass graft  07/2010    axillobifemoral BPG  . Spine surgery  11/23/06     microdiscectomy L5-S1  . Hemorrhoid surgery    . Tonsillectomy    . Hernia repair  02/09/11    Ventral  . Cardiac catheterization      2013  . Coronary angioplasty      2013  . Thyroidectomy Bilateral 10/07/2012    Procedure: THYROIDECTOMY;  Surgeon: Darletta Moll, MD;  Location: [facility] OR;  Service: ENT;  Laterality: Bilateral;  . Radical neck dissection Left 10/07/2012    Procedure: RADICAL NECK DISSECTION;  Surgeon: Darletta Moll, MD;  Location: [facility] OR;  Service: ENT;  Laterality: Left;    Social History: History   Social History  . Marital Status: Single    Spouse Name: N/A    Number of Children: N/A  . Years of Education: N/A   Social History Main Topics  . Smoking status: Current Some Day Smoker -- 1.00 packs/day for 50 years    Types: Cigarettes    Last Attempt to Quit: 03/16/2012  . Smokeless tobacco: Never Used  . Alcohol Use: 4.2 oz/week    7 Cans of beer per week     Comment: 2 drinks per day  . Drug Use: No  . Sexually Active: None   Other Topics Concern  . None   Social History Narrative  . None    Family History: Family History  Problem Relation Age of Onset  . Alzheimer's disease Mother   . Cancer Father     prostate  . Cancer Brother     prostate  . Stroke Brother     Allergies: Allergies  Allergen Reactions  . Ceclor (Cefaclor) Anaphylaxis    Recently took pcn for tooth problems, without issues  . Morphine And Related Other (See Comments)    Abnormal behavior    Current Facility-Administered Medications  Medication Dose Route Frequency Provider Last Rate Last Dose  . aspirin EC tablet 81 mg  81 mg Oral Daily Doree Albee, MD      . calcium carbonate (OS-CAL - dosed in mg of elemental calcium) tablet 500 mg of elemental calcium  1 tablet Oral QID Doree Albee, MD      . clindamycin (CLEOCIN) capsule 300 mg  300 mg Oral QID Doree Albee, MD      . clopidogrel (PLAVIX) tablet 75 mg  75 mg Oral Daily Doree Albee, MD      . furosemide (LASIX)  injection 40 mg  40 mg Intravenous Q12H Doree Albee, MD      . gabapentin (NEURONTIN) tablet 600 mg  600 mg Oral BID Doree Albee, MD      . heparin injection 5,000 Units  5,000 Units Subcutaneous Q8H Doree Albee, MD      . isosorbide mononitrate (IMDUR) 24 hr tablet 30 mg  30 mg Oral Daily Doree Albee, MD      . levofloxacin [facility]) IVPB 500  mg  500 mg Intravenous Q24H Doree Albee, MD   500 mg at 10/15/12 2150  . levothyroxine (SYNTHROID, LEVOTHROID) tablet 100 mcg  100 mcg Oral QAC breakfast Doree Albee, MD      . losartan (COZAAR) tablet 25 mg  25 mg Oral Daily Doree Albee, MD      . methylPREDNISolone sodium succinate (SOLU-MEDROL) 125 mg/2 mL injection 125 mg  125 mg Intravenous Q8H Doree Albee, MD   125 mg at 10/15/12 2150  . metoprolol succinate (TOPROL-XL) 24 hr tablet 25 mg  25 mg Oral Daily Doree Albee, MD      . nitroGLYCERIN (NITROSTAT) SL tablet 0.4 mg  0.4 mg Sublingual Q5 Min x 3 PRN Doree Albee, MD      . oxyCODONE-acetaminophen (PERCOCET/ROXICET) 5-325 MG per tablet 1-2 tablet  1-2 tablet Oral Q4H PRN Doree Albee, MD      . pantoprazole (PROTONIX) EC tablet 40 mg  40 mg Oral Q1200 Doree Albee, MD      . potassium chloride SA (K-DUR,KLOR-CON) CR tablet 10 mEq  10 mEq Oral Daily Doree Albee, MD   10 mEq at 10/15/12 2146  . sodium chloride 0.9 % injection 3 mL  3 mL Intravenous Q12H Doree Albee, MD       Current Outpatient Prescriptions  Medication Sig Dispense Refill  . aspirin EC 81 MG tablet Take 81 mg by mouth daily.      . calcium carbonate (OS-CAL - DOSED IN MG OF ELEMENTAL CALCIUM) 1250 MG tablet Take 1 tablet (500 mg of elemental calcium total) by mouth 4 (four) times daily.  120 tablet  5  . clindamycin (CLEOCIN) 300 MG capsule Take 300 mg by mouth 4 (four) times daily.      . clopidogrel (PLAVIX) 75 MG tablet Take 75 mg by mouth daily.      . furosemide (LASIX) 40 MG tablet Take 40 mg by mouth 2 (two) times daily. May take 1 additional dose  if needed for swelling      . gabapentin (NEURONTIN) 600 MG tablet Take 600 mg by mouth 2 (two) times daily.       . isosorbide mononitrate (IMDUR) 30 MG 24 hr tablet Take 1 tablet (30 mg total) by mouth daily.  30 tablet  6  . levothyroxine (SYNTHROID) 100 MCG tablet Take 1 tablet (100 mcg total) by mouth daily before breakfast.  30 tablet  11  . losartan (COZAAR) 25 MG tablet Take 1 tablet (25 mg total) by mouth daily.  30 tablet  5  . metoprolol succinate (TOPROL-XL) 25 MG 24 hr tablet Take 25 mg by mouth daily.      . nitroGLYCERIN (NITROSTAT) 0.4 MG SL tablet Place 1 tablet (0.4 mg total) under the tongue every 5 (five) minutes x 3 doses as needed for chest pain.  25 tablet  4  . oxyCODONE-acetaminophen (PERCOCET/ROXICET) 5-325 MG per tablet Take 1-2 tablets by mouth every 4 (four) hours as needed for pain.  40 tablet  0  . pantoprazole (PROTONIX) 40 MG tablet Take 1 tablet (40 mg total) by mouth daily at 12 noon.  30 tablet  2  . potassium chloride (K-DUR,KLOR-CON) 10 MEQ tablet Take 10 mEq by mouth daily.       Review Of Systems: 12 point ROS negative except as noted above in HPI.  Physical Exam: Filed Vitals:   10/15/12 2206  BP: 149/90  Pulse: 120  Temp: 98.1 F (36.7 C)  Resp: 18  General: alert and cooperative HEENT: PERRLA, extra ocular movement intact and Anterior neck swelling, non tender, lower neck incision CDI Heart: S1, S2 normal, no murmur, rub or gallop, regular rate and rhythm Lungs: rales and wheezing Abdomen: abdomen is soft without significant tenderness, masses, organomegaly or guarding Extremities: 1-2+ pitting edema on exam  Skin:no rashes, no ecchymoses Neurology: normal without focal findings  Labs and Imaging: Lab Results  Component Value Date/Time   NA 125* 10/15/2012  6:33 PM   K 3.1* 10/15/2012  6:33 PM   CL 78* 10/15/2012  6:33 PM   CO2 37* 10/15/2012  6:33 PM   BUN 14 10/15/2012  6:33 PM   CREATININE 1.28 10/15/2012  9:48 PM   GLUCOSE 103*  10/15/2012  6:33 PM   Lab Results  Component Value Date   WBC 12.5* 10/15/2012   HGB 10.8* 10/15/2012   HCT 36.1* 10/15/2012   MCV 95.5 10/15/2012   PLT 267 10/15/2012    Dg Chest 2 View  10/15/2012  *RADIOLOGY REPORT*  Clinical Data: Shortness of breath, productive cough  CHEST - 2 VIEW  Comparison: 10/04/2012  Findings: Left apical clips again noted.  The patient is rotated to the right.  Right axillary presumed vascular calcifications noted. Moderate enlargement cardiomediastinal silhouette is noted.  Trace bilateral pleural effusions are increased.  A few new peripheral Kerley B lines are identified.  Thoracic spine compression deformities are again noted.  IMPRESSION: Cardiomegaly with increased pleural effusions and interstitial Kerley B lines which may indicate developing interstitial pulmonary edema.   Original Report Authenticated By: Christiana Pellant, M.D.      Assessment and Plan: JAZMINE LONGSHORE is a 72 y.o. year old male presenting with cough and SOB   Respiratory: seems multifactorial with contributions of CHF and COPD. S/p 80mg  lasix IV (takes 40mg  daily at home). Check diuresis response. strict Is and Os. Daily weights. Start on 40mg  lasix IV bid. Start on solumedrol for COPD in conjunction with Levaquin. No infiltrate on CXR. Supplemental O2. Will also cycle cardiac enzymes. Will consult pulmonology as Dr. Delton Coombes is pt's pulm doctor.   ENT: s/p radical thyroidectomy. Incision site seems fairly stable. No signs of infection or secondary airway compromise. Case discussed with ENT (Dr. Suszanne Conners), no surgical interventions currently. Will follow as needed  CV: baseline CAD s/p MI with partial revascularization 03/2012 as well as PVD s/p ac-fem and fem-fem bypass. Diastolic CHF. Volume overloaded on exam. No CP today. Trop WNL and EKG w/ no acute ST or T wave abnormalities. [facility], pt primary cardiologist aware of case. Agree with CHF exacerbation treatment. Plan for consult in am. Cycle cardiac  enzymes, continue to closely follow. Continue ASA and plavix.   Endocrine: -Hyponatremia-hypervolemic in setting of CHF exacerbation. Acute on chronic issue. Will diurese and reassess in am. -Hypocalcemia-post surgical issue as parathyroid glands were also likely removed in radical thyroidectomy. On oscal at home. 1gm IV Calcium gluconate x 1. Continue oscal. Recheck in am. Ionized calcium pending.   ID:  Noted leukocytosis today. Suspect this is post surgical stress response. On levaquin for COPD coverage. Will check blood and urine culture.   Anemia: Baseline hgb 11-12> hgb 10.8 today. Some element of post surgical blood loss and dilution from CHF exacerbation. No signs of bleeding. Continue to follow.   FEN/GI: low salt diet. PPI Prophylaxis: subq heparin. Will monitor incision site closely in setting of concominant ASA and plavix use.  Disposition: pending further evaluation.  Code Status:Full Code  Shanda Howells MD  Pager: [phone]

## 2012-10-16 NOTE — Progress Notes (Addendum)
[facility] Progress Note  TEAM 1 - Stepdown/ICU TEAM   [name] [name] ZOX:[id number] DOB: 02-07-1941 DOA: 10/15/2012 PCP: Kaleen Mask, MD  HPI: This is a 72 y.o. year old male with significant past medical history of multiple medical problems including CHF (mixed), CAD s/p MI while in ICU, COPD, thyroid cancer s/p thyroid surgery approx 1 week ago presenting with cough, SOB x 2 days. Pt currently lives at home with some assistance from [place], daughter Gloris [place]). Per report, daughter states that she found pt having persistent SOB while at home. Had O2 level checked at home with O2 sats in low 80s. Pt wears O2 at night in setting of COPD. Has had to wear consistently since yesterday to help with breathing. No CP per pt. Pt noted to have had extensive thyroidectomy approx 1 week ago with rather extensive involvement into L jugular and internal carotid (please see op note for further details). Minimal neck pain and swelling per pt/daughter. Cough has been somewhat productive. No fevers or chills. Also with wheezing. Pt denies any cigarette intake, but then reports intermittent smoking, "borrowing" cigarettes from brother. Pt denies any excess salt intake, or NSAID use.    Assessment/Plan: Principal Problem:   Acute and chronic respiratory failure - due to COPD and CHF exacerbation - improving  Active Problems:   Acute on chronic combined systolic and diastolic congestive heart failure- EF of 35-40% via echo in 04/2012 - appreciate [facility] eval-  - cont to diurese per cards and f/u on ECHO    COPD exacerbation - tapered solumedrol from 125 TId to 40 TID - cont nebs and levaquin    PVD, Lt Ax-fem and fem-fem BPG 2/12, Dr Edilia Bo follows    HTN (hypertension) - stable    Hypocalcemia - corrected calcium is 7.4 - cont to replace with oral calcium TID and follow  Hyponatremia - likely due to fluid over load - follow with diuresis    Thyroid cancer - s/p total  thyroidectomy with left modified radical neck dissection    CAD (coronary artery disease) - cont ASA    H/O Tobacco abuse, pt quit after MI   Code Status: full code Family Communication: discussed w/ pt Disposition Plan: follow in SDU  Consultants: cardiology  Procedures: none  Antibiotics: Levaquin 4/27  DVT prophylaxis: Heparin  HPI/Subjective: Pt is feeling much better than yesterday. States dyspnea has been progressing over the past 6 wks. Not much of a cough - sputum is white- usually uses 2 L O2 at night.    Objective: Blood pressure 154/97, pulse 79, temperature 97.6 F (36.4 C), temperature source Axillary, resp. rate 22, height 5\' 6"  (1.676 m), weight 63.6 kg (140 lb 3.4 oz), SpO2 96.00%.  Intake/Output Summary (Last 24 hours) at 10/16/12 1601 Last data filed at 10/16/12 1521  Gross per 24 hour  Intake   1130 ml  Output   2400 ml  Net  -1270 ml     Exam: General: AAO X3, No acute respiratory distress Lungs: mild wheezing - no crackles  Cardiovascular: Regular rate and rhythm without murmur gallop or rub normal S1 and S2 Abdomen: Nontender, nondistended, soft, bowel sounds positive, no rebound, no ascites, no appreciable mass Extremities: No significant cyanosis, clubbing, or edema bilateral lower extremities  Data Reviewed: Basic Metabolic Panel:  Recent Labs Lab 10/09/12 2219 10/10/12 0430 10/15/12 1833 10/15/12 2148 10/16/12 0707  NA  --   --  125*  --  127*  K  --   --  3.1*  --  4.5  CL  --   --  78*  --  77*  CO2  --   --  37*  --  38*  GLUCOSE  --   --  103*  --  129*  BUN  --   --  14  --  13  CREATININE  --   --  1.28 1.28 1.18  CALCIUM 7.0* 7.2* 6.6*  --  6.9*   Liver Function Tests:  Recent Labs Lab 10/16/12 0707  AST 26  ALT 16  ALKPHOS 83  BILITOT 0.4  PROT 7.6  ALBUMIN 3.4*   No results found for this basename: LIPASE, AMYLASE,  in the last 168 hours No results found for this basename: AMMONIA,  in the last 168  hours CBC:  Recent Labs Lab 10/15/12 1833 10/15/12 2148  WBC 13.9* 12.5*  NEUTROABS 10.4*  --   HGB 10.7* 10.8*  HCT 30.0* 36.1*  MCV 81.7 95.5  PLT 271 267   Cardiac Enzymes:  Recent Labs Lab 10/15/12 1840 10/16/12 0058 10/16/12 0707 10/16/12 1258  TROPONINI <0.30 <0.30 <0.30 <0.30   BNP (last 3 results)  Recent Labs  05/18/12 0510 08/29/12 1035 10/15/12 1833  PROBNP 8172.0* 7762.0* 13354.0*   CBG: No results found for this basename: GLUCAP,  in the last 168 hours  Recent Results (from the past 240 hour(s))  MRSA PCR SCREENING     Status: None   Collection Time    10/16/12  1:26 AM      Result Value Range Status   MRSA by PCR NEGATIVE  NEGATIVE Final   Comment:            The GeneXpert MRSA Assay (FDA     approved for NASAL specimens     only), is one component of a     comprehensive MRSA colonization     surveillance program. It is not     intended to diagnose MRSA     infection nor to guide or     monitor treatment for     MRSA infections.     Studies:  Recent x-ray studies have been reviewed in detail by the Attending Physician  Scheduled Meds:  Scheduled Meds: . aspirin EC  81 mg Oral Daily  . calcium carbonate  1 tablet Oral QID  . clindamycin  300 mg Oral QID  . clopidogrel  75 mg Oral Daily  . furosemide  40 mg Intravenous Q12H  . gabapentin  600 mg Oral BID  . heparin  5,000 Units Subcutaneous Q8H  . isosorbide mononitrate  30 mg Oral Daily  . levofloxacin (LEVAQUIN) IV  500 mg Intravenous Q24H  . levothyroxine  100 mcg Oral QAC breakfast  . Living Better with Heart Failure Book   Does not apply Once  . losartan  25 mg Oral Daily  . methylPREDNISolone sodium succinate  125 mg Intravenous Q8H  . metoprolol succinate  25 mg Oral Daily  . pantoprazole  40 mg Oral Q1200  . potassium chloride  10 mEq Oral Daily  . ranolazine  500 mg Oral BID  . sodium chloride  3 mL Intravenous Q12H   Continuous Infusions:   Time spent on care of  this patient: 35 min   Calvert Cantor, MD [phone]  [facility] Office  [phone] Pager - Text Page per Amion as per below:  On-Call/Text Page:      [email or web address]      password [email or web address]  If 7PM-7AM, please  contact night-coverage [email or web address] Password [email or web address] 10/16/2012, 4:01 PM   LOS: 1 day

## 2012-10-17 ENCOUNTER — Encounter (HOSPITAL_COMMUNITY): Payer: Self-pay | Admitting: Otolaryngology

## 2012-10-17 ENCOUNTER — Inpatient Hospital Stay (HOSPITAL_COMMUNITY): Payer: Medicare Other

## 2012-10-17 DIAGNOSIS — J9620 Acute and chronic respiratory failure, unspecified whether with hypoxia or hypercapnia: Secondary | ICD-10-CM

## 2012-10-17 DIAGNOSIS — J441 Chronic obstructive pulmonary disease with (acute) exacerbation: Secondary | ICD-10-CM

## 2012-10-17 LAB — COMPREHENSIVE METABOLIC PANEL
Albumin: 3.5 g/dL (ref 3.5–5.2)
Alkaline Phosphatase: 77 U/L (ref 39–117)
BUN: 23 mg/dL (ref 6–23)
Calcium: 6.9 mg/dL — ABNORMAL LOW (ref 8.4–10.5)
Creatinine, Ser: 1.34 mg/dL (ref 0.50–1.35)
Potassium: 3.3 mEq/L — ABNORMAL LOW (ref 3.5–5.1)
Total Protein: 7.8 g/dL (ref 6.0–8.3)

## 2012-10-17 LAB — PRO B NATRIURETIC PEPTIDE: Pro B Natriuretic peptide (BNP): 23367 pg/mL — ABNORMAL HIGH (ref 0–125)

## 2012-10-17 LAB — PHOSPHORUS: Phosphorus: 4.3 mg/dL (ref 2.3–4.6)

## 2012-10-17 LAB — MAGNESIUM: Magnesium: 1.6 mg/dL (ref 1.5–2.5)

## 2012-10-17 MED ORDER — LEVOFLOXACIN 500 MG PO TABS
500.0000 mg | ORAL_TABLET | Freq: Every day | ORAL | Status: DC
  Administered 2012-10-17 – 2012-10-18 (×2): 500 mg via ORAL
  Filled 2012-10-17 (×4): qty 1

## 2012-10-17 MED ORDER — TIOTROPIUM BROMIDE MONOHYDRATE 18 MCG IN CAPS
18.0000 ug | ORAL_CAPSULE | Freq: Every day | RESPIRATORY_TRACT | Status: DC
  Administered 2012-10-18 – 2012-10-22 (×5): 18 ug via RESPIRATORY_TRACT
  Filled 2012-10-17 (×2): qty 5

## 2012-10-17 MED ORDER — SODIUM CHLORIDE 0.9 % IV SOLN
2.0000 g | Freq: Once | INTRAVENOUS | Status: AC
  Administered 2012-10-17: 2 g via INTRAVENOUS
  Filled 2012-10-17: qty 20

## 2012-10-17 MED ORDER — PREDNISONE 20 MG PO TABS
40.0000 mg | ORAL_TABLET | Freq: Every day | ORAL | Status: DC
  Administered 2012-10-18 – 2012-10-19 (×2): 40 mg via ORAL
  Filled 2012-10-17 (×3): qty 2

## 2012-10-17 MED ORDER — METHYLPREDNISOLONE SODIUM SUCC 40 MG IJ SOLR
40.0000 mg | Freq: Two times a day (BID) | INTRAMUSCULAR | Status: AC
  Administered 2012-10-17 – 2012-10-18 (×2): 40 mg via INTRAVENOUS
  Filled 2012-10-17 (×2): qty 1

## 2012-10-17 MED ORDER — STARCH (THICKENING) PO POWD
ORAL | Status: DC | PRN
  Filled 2012-10-17 (×2): qty 227

## 2012-10-17 NOTE — Progress Notes (Signed)
Nutrition Brief Note  Patient identified on the Malnutrition Screening Tool (MST) Report. Pt reports usual weight is 140 lb. This is consistent with current weight of 138 lb. Pt and family report that pt is eating well presently. Deny any questions/concerns.  Wt Readings from Last 15 Encounters:  10/17/12 138 lb 14.2 oz (63 kg)  10/07/12 143 lb 4.8 oz (65 kg)  10/07/12 143 lb 4.8 oz (65 kg)  10/04/12 141 lb 9.6 oz (64.229 kg)  09/29/12 142 lb (64.411 kg)  06/09/12 135 lb 2.3 oz (61.3 kg)  05/18/12 122 lb 12.7 oz (55.7 kg)  04/30/12 129 lb 9.6 oz (58.786 kg)  04/27/12 131 lb 6.4 oz (59.603 kg)  04/11/12 131 lb 9.6 oz (59.693 kg)  04/06/12 123 lb 14.4 oz (56.2 kg)  04/06/12 123 lb 14.4 oz (56.2 kg)  03/15/12 138 lb (62.596 kg)  10/13/11 137 lb (62.143 kg)  08/28/11 141 lb 9.6 oz (64.229 kg)     Body mass index is 22.43 kg/(m^2). Patient meets criteria for normal weight based on current BMI.   Current diet order is Dysphagia 3 diet with Honey Thickened Liquids. Labs and medications reviewed.   No nutrition interventions warranted at this time. If nutrition issues arise, please consult RD.   Jarold Motto MS, RD, LDN Pager: [phone] After-hours pager: [phone]

## 2012-10-17 NOTE — Progress Notes (Signed)
  Echocardiogram 2D Echocardiogram has been performed.  [name] [name] FRANCES 10/17/2012, 5:41 PM 

## 2012-10-17 NOTE — Evaluation (Signed)
Clinical/Bedside Swallow Evaluation Patient Details  Name: [name] [name] MRN: [id number] Date of Birth: July 11, 1940  Today's Date: 10/17/2012 Time: 4098-1191 SLP Time Calculation (min): 17 min  Past Medical History:  Past Medical History  Diagnosis Date  . Hypertension   . Hernia   . Hyperlipidemia   . Peripheral vascular disease   . Shoulder fracture, left   . At risk for sudden cardiac death, due to [facility] Apr 18, 2012  . CHF (congestive heart failure)   . Cancer     skin cancer  . Shortness of breath   . Stroke 2004    minor  . Dysrhythmia     PAF  . COPD (chronic obstructive pulmonary disease)     PT DENIES USES O2 2 LITERS HS  . Heart murmur   . Hepatitis     AT AGE  22; unknown type    . Heart attack     03/17/12; subtotally occluded RCI with unsucessful PCI  . Coronary artery disease     Cardiologist is Dr. Nanetta Batty    Past Surgical History:  Past Surgical History  Procedure Laterality Date  . Bypass graft  07/2010    axillobifemoral BPG  . Spine surgery  11/23/06    microdiscectomy L5-S1  . Hemorrhoid surgery    . Tonsillectomy    . Hernia repair  02/09/11    [facility]  . Cardiac catheterization      2013  . Coronary angioplasty      2013  . Thyroidectomy Bilateral 10/07/2012    Procedure: THYROIDECTOMY;  Surgeon: Darletta Moll, MD;  Location[facility] OR;  Service: ENT;  Laterality: Bilateral;  . Radical neck dissection Left 10/07/2012    Procedure: RADICAL NECK DISSECTION;  Surgeon: Darletta Moll, MD;  Location: [facility] OR;  Service: ENT;  Laterality: Left;   HPI:  72 y.o. 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 Pt noted to have had extensive thyroidectomy approx 1 week ago with rather extensive involvement into L jugular and internal carotid.   CXR Cardiomegaly with increased pleural effusions and interstitial Kerley B lines which may indicate developing interstitial pulmonary edema.   Assessment / Plan / Recommendation Clinical Impression  Pt. presents with strong indications of aspiration with immediate cough following 2 of 4 cup sips thin liquid.  Delayed swallow suspected and decreased hyolaryngeal elevation.  Pt. would benefit from MBS to fully assess pharyngeal phase swallow with benefits of head manuevers and po modification if needed.  MBS scheduled for today at 0930.    Aspiration Risk  Severe    Diet Recommendation  (to be determined)        Other  Recommendations Recommended Consults: MBS Oral Care Recommendations: Oral care BID   Follow Up Recommendations   (to be determined)    Frequency and Duration        Pertinent Vitals/Pain none         Swallow Study           Oral/Motor/Sensory Function Overall Oral Motor/Sensory Function: Appears within functional limits for tasks assessed   Ice Chips Ice chips: Not tested   Thin Liquid Thin Liquid: Impaired Presentation: Cup Pharyngeal  Phase Impairments: Cough - Immediate;Suspected delayed Swallow;Decreased hyoid-laryngeal movement    Nectar Thick  Nectar Thick Liquid: Not tested   Honey Thick Honey Thick Liquid: Not tested   Puree Puree: Not tested   Solid       Solid: Within functional limits       Royce Macadamia M.Ed [employer] [phone]  10/17/2012

## 2012-10-17 NOTE — Progress Notes (Signed)
[facility] Progress Note [place] TEAM 1 - Stepdown/ICU TEAM   EMIGDIO WILDEMAN ZOX:[id number] DOB: 08/09/1940 DOA: 10/15/2012 PCP: Kaleen Mask, MD  HPI: 72 y.o. year old male with significant past medical history of multiple medical problems including CHF (mixed), CAD s/p MI, COPD, thyroid cancer s/p thyroid surgery approx 1 week ago who presented with cough & SOB x 2 days. Pt lives at home with some assistance from [place], daughter Gloris [place]). Per report, daughter stated that she found pt having persistent SOB while at home. Had O2 level checked at home with O2 sats in low 80s. Pt wears O2 at night in setting of COPD. Has had to wear consistently for 24 hours to help with breathing. No CP per pt. Pt noted to have had extensive thyroidectomy approx 1 week prior to admission with rather extensive involvement into L jugular and internal carotid regions (please see op note for further details). Minimal neck pain and swelling per pt/daughter. Cough had been somewhat productive. No fevers or chills. Pt denied any cigarette intake, but then reported intermittent smoking, "borrowing" cigarettes from brother. Pt denied any excess salt intake, or NSAID use.   Assessment/Plan:    Acute and chronic respiratory failure due to COPD exacerbation - cont to taper steroids -hypo Ca+ can cause bronchospasm -cont supportive care/nebs -Dr Delton Coombes also following    Acute on chronic combined systolic and diastolic congestive heart failure- EF of 35-40% via echo in 04/2012 - appreciate [facility] eval - repeating ECHO this admit - cont diuretics    PVD, Lt Ax-fem and fem-fem BPG 2/12, Dr Edilia Bo follows - asymptomatic at this time  Mild acute delirium  -likely due to steroids and hypo Ca+ - follow    HTN (hypertension) - stable    Hypocalcemia - corrected calcium is 7.4 - cont to replace with oral calcium TID  -will also check magnesium and phosphorus and replete as indicated -give 1x dose IV  calcium gluconate today (4/28)  Hyponatremia/hypochloremia/metabolic alkalosis - likely due to fluid over load and severe hypocalcemia - follow with diuresis    Thyroid cancer - s/p total thyroidectomy with left modified radical neck dissection    CAD (coronary artery disease) - cont ASA    H/O Tobacco abuse  Code Status: full code Family Communication: discussed w/ pt and daughter at bedside Disposition Plan: SDU  Consultants: Cardiology Pulmonary  Procedures: 4/28 TTE - pending  Antibiotics: Levaquin 4/27 >>  DVT prophylaxis: SQ Heparin  HPI/Subjective: Pt endorses breathing better but still weak- also dtr concerned pt more confused over past 2 days  Objective: Blood pressure 152/81, pulse 85, temperature 97.9 F (36.6 C), temperature source Oral, resp. rate 20, height 5\' 6"  (1.676 m), weight 63 kg (138 lb 14.2 oz), SpO2 95.00%.  Intake/Output Summary (Last 24 hours) at 10/17/12 1318 Last data filed at 10/17/12 1250  Gross per 24 hour  Intake    223 ml  Output   1375 ml  Net  -1152 ml   Exam: General: AAO X3, No acute respiratory distress Lungs: mild wheezing - no crackles  Cardiovascular: Regular rate and rhythm without murmur gallop or rub  Abdomen: Nontender, nondistended, soft, bowel sounds positive, no rebound, no ascites, no appreciable mass Extremities: No significant cyanosis, clubbing, or edema bilateral lower extremities  Data Reviewed: Basic Metabolic Panel:  Recent Labs Lab 10/15/12 1833 10/15/12 2148 10/16/12 0707 10/17/12 0656 10/17/12 1040  NA 125*  --  127* 125*  --   K 3.1*  --  4.5 3.3*  --   CL 78*  --  77* 77*  --   CO2 37*  --  38* 38*  --   GLUCOSE 103*  --  129* 116*  --   BUN 14  --  13 23  --   CREATININE 1.28 1.28 1.18 1.34  --   CALCIUM 6.6*  --  6.9* 6.9*  --   MG  --   --   --   --  1.6  PHOS  --   --   --   --  4.3   Liver Function Tests:  Recent Labs Lab 10/16/12 0707 10/17/12 0656  AST 26 28  ALT 16 18   ALKPHOS 83 77  BILITOT 0.4 0.4  PROT 7.6 7.8  ALBUMIN 3.4* 3.5   CBC:  Recent Labs Lab 10/15/12 1833 10/15/12 2148  WBC 13.9* 12.5*  NEUTROABS 10.4*  --   HGB 10.7* 10.8*  HCT 30.0* 36.1*  MCV 81.7 95.5  PLT 271 267   Cardiac Enzymes:  Recent Labs Lab 10/15/12 1840 10/16/12 0058 10/16/12 0707 10/16/12 1258  TROPONINI <0.30 <0.30 <0.30 <0.30   BNP (last 3 results)  Recent Labs  05/18/12 0510 08/29/12 1035 10/15/12 1833  PROBNP 8172.0* 7762.0* 13354.0*     Recent Results (from the past 240 hour(s))  MRSA PCR SCREENING     Status: None   Collection Time    10/16/12  1:26 AM      Result Value Range Status   MRSA by PCR NEGATIVE  NEGATIVE Final   Comment:            The GeneXpert MRSA Assay (FDA     approved for NASAL specimens     only), is one component of a     comprehensive MRSA colonization     surveillance program. It is not     intended to diagnose MRSA     infection nor to guide or     monitor treatment for     MRSA infections.     Studies:  Recent x-ray studies have been reviewed in detail by the Attending Physician  Scheduled Meds:  Scheduled Meds: . aspirin EC  81 mg Oral Daily  . calcium carbonate  1 tablet Oral TID WC  . clindamycin  300 mg Oral QID  . clopidogrel  75 mg Oral Daily  . furosemide  40 mg Intravenous Q12H  . gabapentin  600 mg Oral BID  . heparin  5,000 Units Subcutaneous Q8H  . isosorbide mononitrate  30 mg Oral Daily  . levofloxacin  500 mg Oral q1800  . levothyroxine  100 mcg Oral QAC breakfast  . losartan  25 mg Oral Daily  . methylPREDNISolone (SOLU-MEDROL) injection  40 mg Intravenous TID  . metoprolol succinate  25 mg Oral Daily  . pantoprazole  40 mg Oral Q1200  . potassium chloride  10 mEq Oral Daily  . ranolazine  500 mg Oral BID  . sodium chloride  3 mL Intravenous Q12H  . tiotropium  18 mcg Inhalation Daily   Continuous Infusions:   Time spent on care of this patient: 35 min   ELLIS,ALLISON  L.,ANP [phone]  [facility] Office  [phone] Pager - Text Page per Loretha Stapler as per below:  On-Call/Text Page:      [email or web address]      password [email or web address]  If 7PM-7AM, please contact night-coverage [email or web address] Password [email or web address] 10/17/2012, 1:18 PM   LOS: 2 days   I have  personally examined this patient and reviewed the entire database. I have reviewed the above note, made any necessary editorial changes, and agree with its content.  Lonia Blood, MD [facility]

## 2012-10-17 NOTE — Consult Note (Signed)
PCCM Brief Note  Thank you for notifying me about Mr Fountain's admission to the hospital. I last saw him in 10/13. He has since been dx and surgically treated for thyroid CA. He is admitted with a COPD exacerbation. He was on Spiriva, although I don't see it on his home or current med lists. He is being treated with solumedrol and levaquin. Would continue the abx x 5 days, transition to PO prednisone on 4/29. I will add Spiriva qd.   I will see him 4/29 with full consult note to follow.   Levy Pupa, MD, PhD 10/17/2012, 1:13 PM [facility] [phone] or if no answer [phone]

## 2012-10-17 NOTE — Progress Notes (Signed)
[facility]  Subjective: Mild improvement in SOB. Still feels "winded" at times. Continues frequent coughing with only minimal sputum production. Feels very congested. Daughter is by his bedside and has noticed that he has been intermittently confused for the past day.   Objective: Vital signs in last 24 hours: Temp:  [97.6 F (36.4 C)-98.8 F (37.1 C)] 97.9 F (36.6 C) (04/28 0809) Pulse Rate:  [74-95] 95 (04/28 0809) Resp:  [16-22] 16 (04/28 0809) BP: (95-155)/(62-97) 95/76 mmHg (04/28 0809) SpO2:  [95 %-96 %] 96 % (04/28 0809) Weight:  [138 lb 14.2 oz (63 kg)] 138 lb 14.2 oz (63 kg) (04/28 0500) Last BM Date: 10/16/12  Intake/Output from previous day: 04/27 0701 - 04/28 0700 In: 823 [P.O.:720; I.V.:3; IV Piggyback:100] Out: 1275 [Urine:1275] Intake/Output this shift:    Medications Current Facility-Administered Medications  Medication Dose Route Frequency Provider Last Rate Last Dose  . albuterol (PROVENTIL) (5 MG/ML) 0.5% nebulizer solution 2.5 mg  2.5 mg Nebulization Q4H PRN Calvert Cantor, MD      . aspirin EC tablet 81 mg  81 mg Oral Daily Doree Albee, MD   81 mg at 10/16/12 1014  . calcium carbonate (OS-CAL - dosed in mg of elemental calcium) tablet 500 mg of elemental calcium  1 tablet Oral TID WC Calvert Cantor, MD   500 mg of elemental calcium at 10/17/12 0804  . clindamycin (CLEOCIN) capsule 300 mg  300 mg Oral QID Doree Albee, MD   300 mg at 10/16/12 2231  . clopidogrel (PLAVIX) tablet 75 mg  75 mg Oral Daily Doree Albee, MD   75 mg at 10/17/12 0804  . furosemide (LASIX) injection 40 mg  40 mg Intravenous Q12H Doree Albee, MD   40 mg at 10/17/12 1610  . gabapentin (NEURONTIN) tablet 600 mg  600 mg Oral BID Doree Albee, MD   600 mg at 10/16/12 2231  . heparin injection 5,000 Units  5,000 Units Subcutaneous Q8H Doree Albee, MD   5,000 Units at 10/17/12 [phone]  . isosorbide mononitrate (IMDUR) 24 hr tablet 30 mg  30 mg Oral Daily  Doree Albee, MD   30 mg at 10/16/12 1015  . levofloxacin (LEVAQUIN) tablet 500 mg  500 mg Oral q1800 Lonia Blood, MD      . levothyroxine (SYNTHROID, LEVOTHROID) tablet 100 mcg  100 mcg Oral QAC breakfast Doree Albee, MD   100 mcg at 10/17/12 0804  . Living Better with Heart Failure Book   Does not apply Once Calvert Cantor, MD      . losartan (COZAAR) tablet 25 mg  25 mg Oral Daily Doree Albee, MD   25 mg at 10/16/12 1015  . methylPREDNISolone sodium succinate (SOLU-MEDROL) 40 mg/mL injection 40 mg  40 mg Intravenous TID Calvert Cantor, MD   40 mg at 10/16/12 2232  . metoprolol succinate (TOPROL-XL) 24 hr tablet 25 mg  25 mg Oral Daily Doree Albee, MD   25 mg at 10/16/12 1015  . nitroGLYCERIN (NITROSTAT) SL tablet 0.4 mg  0.4 mg Sublingual Q5 Min x 3 PRN Doree Albee, MD      . oxyCODONE-acetaminophen (PERCOCET/ROXICET) 5-325 MG per tablet 1-2 tablet  1-2 tablet Oral Q4H PRN Doree Albee, MD   2 tablet at 10/16/12 0111  . pantoprazole (PROTONIX) EC tablet 40 mg  40 mg Oral Q1200 Doree Albee, MD   40 mg at 10/16/12 1306  . potassium chloride SA (K-DUR,KLOR-CON) CR tablet 10 mEq  10 mEq Oral  Daily Doree Albee, MD   10 mEq at 10/16/12 1014  . ranolazine (RANEXA) 12 hr tablet 500 mg  500 mg Oral BID Brittainy Simmons, PA-C   500 mg at 10/16/12 2231  . sodium chloride 0.9 % injection 3 mL  3 mL Intravenous Q12H Doree Albee, MD   3 mL at 10/16/12 2234    PE: General appearance: alert, cooperative and no distress Lungs: clear to auscultation bilaterally and much improved from yesterday Heart: regular rate and rhythm Pulses: 2+ and symmetric Skin: warm and dry Neurologic: Grossly normal  Lab Results:   Recent Labs  10/15/12 1833 10/15/12 2148  WBC 13.9* 12.5*  HGB 10.7* 10.8*  HCT 30.0* 36.1*  PLT 271 267   BMET  Recent Labs  10/15/12 1833 10/15/12 2148 10/16/12 0707 10/17/12 0656  NA 125*  --  127* 125*  K 3.1*  --  4.5 3.3*  CL 78*  --  77* 77*  CO2 37*  --   38* 38*  GLUCOSE 103*  --  129* 116*  BUN 14  --  13 23  CREATININE 1.28 1.28 1.18 1.34  CALCIUM 6.6*  --  6.9* 6.9*   BNP (last 3 results)  Recent Labs  05/18/12 0510 08/29/12 1035 10/15/12 1833  PROBNP 8172.0* 7762.0* 13354.0*    Assessment/Plan  Principal Problem:   Acute and chronic respiratory failure Active Problems:   COPD exacerbation   Tobacco abuse, pt quit after MI   Acute on chronic combined systolic and diastolic congestive heart failure- EF of 35-40% via echo in 04/2012   COPD, moderate   PVD, Lt Ax-fem and fem-fem BPG 2/12, Dr Manson Passey follows   HTN (hypertension)   Dyslipidemia   Peripheral vascular disease, unspecified   Hypocalcemia   Thyroid cancer   CAD (coronary artery disease)  Plan: Admitted for CHF + COPD exacerbation. Pro BNP lab order for today was placed yesterday. Waiting result. BNP at time of admission was > 13K. Total net diuresis in past 48 hrs is 1.67L. Continue with IV Lasix for now. SCr. has been increasing, but still stable at 1.34. He is hypokalemic at 3.3. Give K-Dur as scheduled. Daughter is by his bedside and has concerns for new intermittent confusion x 1 day. He is hyponatremic with Na+ of 125. No lethargy. Will need to keep close check while diuresing. If BNP is improved today, may be able to switch back to PO Lasix. Plan for 2D echo today to assess LV function. Continue with antibiotics and steroids, per [facility], for COPD exacerbation. Lungs sound much better today than yesterday. Pt is frustrated that he feels very congested but is not coughing up mucus.  ? If a mucolytic would be reasonable/safe option. MD to follow with further recommendation.    LOS: 2 days    Brittainy M. Delmer Islam 10/17/2012 8:55 AM  I have seen and examined the patient along with Brittainy M. Sharol Harness, PA-C.  I have reviewed the chart, notes and new data.  I agree with PA's note.  Key new complaints: he was disoriented and confused last night and earlier this  AM. Now back to usual self Key examination changes: no edema, no JVD, no S3 Key new findings / data: Na is worsening and hyponatremia may explain his mental status problems  PLAN: Fluid restriction. Stop diuretics - he appears clinically euvolemic and diuretics may worsen his significant hyponatremia (note that his "baseline" Na level is around 132; he appears to have chronic hyponatremia, likely multifactorial  related to lung and thyroid problems, etc). Will review echo for signs of elevated left heart pressure.  Thurmon Fair, MD, [facility] [phone] 10/17/2012, 3:00 PM

## 2012-10-17 NOTE — Procedures (Signed)
Objective Swallowing Evaluation: Modified Barium Swallowing Study  Patient Details  Name: [name] [name] MRN: [id number] Date of Birth: January 26, 1941  Today's Date: 10/17/2012 Time: 0950-1018 SLP Time Calculation (min): 28 min  Past Medical History:  Past Medical History  Diagnosis Date  . Hypertension   . Hernia   . Hyperlipidemia   . Peripheral vascular disease   . Shoulder fracture, left   . At risk for sudden cardiac death, due to [facility] 2012/03/29  . CHF (congestive heart failure)   . Cancer     skin cancer  . Shortness of breath   . Stroke 2004    minor  . Dysrhythmia     PAF  . COPD (chronic obstructive pulmonary disease)     PT DENIES USES O2 2 LITERS HS  . Heart murmur   . Hepatitis     AT AGE  59; unknown type    . Heart attack     03/17/12; subtotally occluded RCI with unsucessful PCI  . Coronary artery disease     Cardiologist is Dr. Nanetta Batty    Past Surgical History:  Past Surgical History  Procedure Laterality Date  . Bypass graft  07/2010    axillobifemoral BPG  . Spine surgery  11/23/06    microdiscectomy L5-S1  . Hemorrhoid surgery    . Tonsillectomy    . Hernia repair  02/09/11    [facility]  . Cardiac catheterization      2013  . Coronary angioplasty      2013  . Thyroidectomy Bilateral 10/07/2012    Procedure: THYROIDECTOMY;  Surgeon: Darletta Moll, MD;  Location: [facility] OR;  Service: ENT;  Laterality: Bilateral;  . Radical neck dissection Left 10/07/2012    Procedure: RADICAL NECK DISSECTION;  Surgeon: Darletta Moll, MD;  Location: [facility] OR;  Service: ENT;  Laterality: Left;   HPI:  72 y.o. 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 Pt noted to have had extensive thyroidectomy approx 1 week ago with rather extensive involvement into  L jugular and internal carotid.  CXR Cardiomegaly with increased pleural effusions and interstitial Kerley B lines which may indicate developing interstitial pulmonary edema.  MBS recommended.     Assessment / Plan / Recommendation Clinical Impression  Dysphagia Diagnosis: Moderate pharyngeal phase dysphagia;Mild cervical esophageal phase dysphagia Clinical impression: Pt. exhibits a moderate pharyngeal phase dysphagia and mild cervical esophageal dysphagia.  Pharyngeal impairments are impacted by motor dysfunction leading to decreased laryngeal closure and silent aspiration with thin and nectar consistencies.  Chin tuck posture did not facilitate swallow and resulted in continued penetration/aspiration.  Vallecular and pyriform sinus residue was min-mild overall and mild-moderate vallecular residue with honey thick.  A verbal cue for second swallow cleared majority of residue.  Upper cervical esophageal sphincter did not allow complete passage of bolus into esophagus with intermittent pyriform sinus.  Prognosis/timeframe for return to thin liquids not known with possible vagus nerve involvement post surgery (per post-op note).  Recommend honey thick liquids (pt. has verbalized a strong dislike for thick liquids and may refuse honey thick) and Dys 3 diet (energy conservation).  SLP will follow for toleration of diet and further education.      Treatment Recommendation  Therapy as outlined in treatment plan  below    Diet Recommendation Dysphagia 3 (Mechanical Soft);Honey-thick liquid   Liquid Administration via: Cup Medication Administration: Whole meds with puree Supervision: Patient able to self feed;Intermittent supervision to cue for compensatory strategies Compensations: Slow rate;Small sips/bites;Multiple dry swallows after each bite/sip Postural Changes and/or Swallow Maneuvers: Seated upright 90 degrees    Other  Recommendations Recommended Consults: MBS Oral Care Recommendations: Oral  care BID   Follow Up Recommendations  Home health SLP    Frequency and Duration min 2x/week  2 weeks   Pertinent Vitals/Pain     SLP Swallow Goals Patient will utilize recommended strategies during swallow to increase swallowing safety with: Minimal cueing Goal #3: Pt. will perform pharyngeal exercises to strengthen musculature with min verbal cues.      Reason for Referral Objectively evaluate swallowing function   Oral Phase Oral Preparation/Oral Phase Oral Phase: WFL   Pharyngeal Phase Pharyngeal Phase Pharyngeal Phase: Impaired Pharyngeal - Honey Pharyngeal - Honey Teaspoon: Pharyngeal residue - valleculae;Pharyngeal residue - pyriform sinuses;Reduced laryngeal elevation;Reduced tongue base retraction Pharyngeal - Honey Cup: Pharyngeal residue - valleculae;Pharyngeal residue - pyriform sinuses;Reduced laryngeal elevation;Reduced tongue base retraction Pharyngeal - Nectar Pharyngeal - Nectar Teaspoon: Reduced laryngeal elevation;Reduced anterior laryngeal mobility;Reduced airway/laryngeal closure;Penetration/Aspiration during swallow Penetration/Aspiration details (nectar teaspoon): Material enters airway, CONTACTS cords and not ejected out;Material enters airway, remains ABOVE vocal cords then ejected out;Material enters airway, remains ABOVE vocal cords and not ejected out Pharyngeal - Nectar Cup: Pharyngeal residue - valleculae;Pharyngeal residue - pyriform sinuses;Reduced airway/laryngeal closure;Reduced laryngeal elevation;Reduced anterior laryngeal mobility;Penetration/Aspiration during swallow;Trace aspiration Penetration/Aspiration details (nectar cup): Material enters airway, passes BELOW cords without attempt by patient to eject out (silent aspiration);Material enters airway, remains ABOVE vocal cords and not ejected out;Material enters airway, remains ABOVE vocal cords then ejected out Pharyngeal - Thin Pharyngeal - Thin Teaspoon: Not tested Pharyngeal - Thin Cup:  Penetration/Aspiration during swallow;Reduced laryngeal elevation;Reduced anterior laryngeal mobility;Reduced airway/laryngeal closure;Trace aspiration;Pharyngeal residue - valleculae;Pharyngeal residue - pyriform sinuses (trace-min residue) Penetration/Aspiration details (thin cup): Material enters airway, passes BELOW cords without attempt by patient to eject out (silent aspiration);Material enters airway, remains ABOVE vocal cords and not ejected out;Material enters airway, remains ABOVE vocal cords then ejected out Pharyngeal - Solids Pharyngeal - Regular: Pharyngeal residue - valleculae;Reduced tongue base retraction  Cervical Esophageal Phase    GO    Cervical Esophageal Phase Cervical Esophageal Phase: Impaired (intermittent backflow into pyriform sinuses)         Darrow Bussing.Ed [employer] [phone]  10/17/2012

## 2012-10-17 NOTE — Progress Notes (Signed)
PHARMACIST - PHYSICIAN COMMUNICATION DR:   Sharon Seller CONCERNING: Antibiotic IV to Oral Route Change Policy  RECOMMENDATION: This patient is receiving levofloxacin by the intravenous route.  Based on criteria approved by the Pharmacy and Therapeutics Committee, the antibiotic(s) is/are being converted to the equivalent oral dose form(s).   DESCRIPTION: These criteria include:  Patient being treated for a respiratory tract infection, urinary tract infection, or cellulitis  The patient is not neutropenic and does not exhibit a GI malabsorption state  The patient is eating (either orally or via tube) and/or has been taking other orally administered medications for a least 24 hours  The patient is improving clinically and has a Tmax < 100.5  If you have questions about this conversion, please contact the Pharmacy Department  []   [phone] )  [facility] [x]   [phone] )  [facility]  []   [phone] )  [facility] []   [phone] )  [facility]   Celedonio Miyamoto, PharmD, BCPS Clinical Pharmacist Pager [phone]

## 2012-10-17 NOTE — Progress Notes (Signed)
Pt well known to me.  Admitted over the weekend for CHF.  Neck incision healing well.  Pt alert and oriented on exam.  Consider increase his Os-Cal dosage.  IV calcium replacement while in the hospital.  No other acute ENT intervention needed.

## 2012-10-18 DIAGNOSIS — I5041 Acute combined systolic (congestive) and diastolic (congestive) heart failure: Secondary | ICD-10-CM

## 2012-10-18 DIAGNOSIS — I5043 Acute on chronic combined systolic (congestive) and diastolic (congestive) heart failure: Secondary | ICD-10-CM

## 2012-10-18 DIAGNOSIS — J449 Chronic obstructive pulmonary disease, unspecified: Secondary | ICD-10-CM

## 2012-10-18 LAB — BASIC METABOLIC PANEL
Calcium: 6.7 mg/dL — ABNORMAL LOW (ref 8.4–10.5)
GFR calc Af Amer: 62 mL/min — ABNORMAL LOW (ref >90)
GFR calc non Af Amer: 53 mL/min — ABNORMAL LOW (ref >90)
Potassium: 3.4 mEq/L — ABNORMAL LOW (ref 3.5–5.1)
Sodium: 125 mEq/L — ABNORMAL LOW (ref 135–145)

## 2012-10-18 LAB — FOLATE: Folate: 18.3 ng/mL

## 2012-10-18 LAB — CBC
Hemoglobin: 11.8 g/dL — ABNORMAL LOW (ref 13.0–17.0)
Platelets: 327 10*3/uL (ref 150–400)
RBC: 4.19 MIL/uL — ABNORMAL LOW (ref 4.22–5.81)
WBC: 27.4 10*3/uL — ABNORMAL HIGH (ref 4.0–10.5)

## 2012-10-18 LAB — VITAMIN B12: Vitamin B-12: 556 pg/mL (ref 211–911)

## 2012-10-18 MED ORDER — SODIUM CHLORIDE 0.9 % IV SOLN
2.0000 g | Freq: Once | INTRAVENOUS | Status: AC
  Administered 2012-10-18: 2 g via INTRAVENOUS
  Filled 2012-10-18: qty 20

## 2012-10-18 MED ORDER — MAGNESIUM SULFATE 40 MG/ML IJ SOLN
2.0000 g | Freq: Once | INTRAMUSCULAR | Status: AC
  Administered 2012-10-18: 2 g via INTRAVENOUS
  Filled 2012-10-18: qty 50

## 2012-10-18 NOTE — Consult Note (Signed)
PULMONARY  / CRITICAL CARE MEDICINE  Name: [name] [name] MRN: [id number] DOB: [birth year]    ADMISSION DATE:  10/15/2012 CONSULTATION DATE:  4/28  REFERRING MD :  [facility] PRIMARY SERVICE:  [facility]  CHIEF COMPLAINT:  Multifactorial dyspnea  BRIEF PATIENT DESCRIPTION: 72 yo man know to me from office w hx severe COPD, also with CAD and systolic + diastolic CHF and secondary PAH. Admitted 4/27. He is s/p recent thyroid resection for malignancy. Being treated for AE-COPD, also evaluated by [facility] for contribution of his cardiac disease.  SIGNIFICANT EVENTS / STUDIES:  Swallow eval 4/28 >> dysphagia, modified diet recommended TTE 4/29 >>   LINES / TUBES:   CULTURES:   ANTIBIOTICS: levaquin 4/27 >>   PAST MEDICAL HISTORY :  Past Medical History  Diagnosis Date  . Hypertension   . Hernia   . Hyperlipidemia   . Peripheral vascular disease   . Shoulder fracture, left   . At risk for sudden cardiac death, due to [facility] 04/01/2012  . CHF (congestive heart failure)   . Cancer     skin cancer  . Shortness of breath   . Stroke 2004    minor  . Dysrhythmia     PAF  . COPD (chronic obstructive pulmonary disease)     PT DENIES USES O2 2 LITERS HS  . Heart murmur   . Hepatitis     AT AGE  61; unknown type    . Heart attack     03/17/12; subtotally occluded RCI with unsucessful PCI  . Coronary artery disease     Cardiologist is Dr. Nanetta Batty    Past Surgical History  Procedure Laterality Date  . Bypass graft  07/2010    axillobifemoral BPG  . Spine surgery  11/23/06    microdiscectomy L5-S1  . Hemorrhoid surgery    . Tonsillectomy    . Hernia repair  02/09/11    Ventral  . Cardiac catheterization      2013  . Coronary angioplasty      2013  . Thyroidectomy Bilateral 10/07/2012    Procedure: THYROIDECTOMY;  Surgeon: Darletta Moll, MD;  Location: [facility] OR;  Service: ENT;  Laterality: Bilateral;  . Radical neck dissection Left 10/07/2012    Procedure: RADICAL NECK DISSECTION;   Surgeon: Darletta Moll, MD;  Location: [facility] OR;  Service: ENT;  Laterality: Left;   Prior to Admission medications   Medication Sig Start Date End Date Taking? Authorizing Provider  aspirin EC 81 MG tablet Take 81 mg by mouth daily.   Yes Historical Provider, MD  calcium carbonate (OS-CAL - DOSED IN MG OF ELEMENTAL CALCIUM) 1250 MG tablet Take 1 tablet (500 mg of elemental calcium total) by mouth 4 (four) times daily. 10/10/12  Yes Darletta Moll, MD  clindamycin (CLEOCIN) 300 MG capsule Take 300 mg by mouth 4 (four) times daily.   Yes Historical Provider, MD  clopidogrel (PLAVIX) 75 MG tablet Take 75 mg by mouth daily.   Yes Historical Provider, MD  furosemide (LASIX) 40 MG tablet Take 40 mg by mouth 2 (two) times daily. May take 1 additional dose if needed for swelling   Yes Historical Provider, MD  gabapentin (NEURONTIN) 600 MG tablet Take 600 mg by mouth 2 (two) times daily.    Yes Historical Provider, MD  isosorbide mononitrate (IMDUR) 30 MG 24 hr tablet Take 1 tablet (30 mg total) by mouth daily. 04/06/12  Yes Nada Boozer, NP  levothyroxine (SYNTHROID) 100 MCG tablet  Take 1 tablet (100 mcg total) by mouth daily before breakfast. 10/10/12  Yes Darletta Moll, MD  losartan (COZAAR) 25 MG tablet Take 1 tablet (25 mg total) by mouth daily. 05/18/12  Yes Wilburt Finlay, PA-C  metoprolol succinate (TOPROL-XL) 25 MG 24 hr tablet Take 25 mg by mouth daily.   Yes Historical Provider, MD  nitroGLYCERIN (NITROSTAT) 0.4 MG SL tablet Place 1 tablet (0.4 mg total) under the tongue every 5 (five) minutes x 3 doses as needed for chest pain. 04/06/12  Yes Nada Boozer, NP  oxyCODONE-acetaminophen (PERCOCET/ROXICET) 5-325 MG per tablet Take 1-2 tablets by mouth every 4 (four) hours as needed for pain. 10/10/12  Yes Darletta Moll, MD  pantoprazole (PROTONIX) 40 MG tablet Take 1 tablet (40 mg total) by mouth daily at 12 noon. 04/06/12  Yes Nada Boozer, NP  potassium chloride (K-DUR,KLOR-CON) 10 MEQ tablet Take 10 mEq by mouth daily.    Yes Historical Provider, MD   Allergies  Allergen Reactions  . Ceclor (Cefaclor) Anaphylaxis    Recently took pcn for tooth problems, without issues  . Morphine And Related Other (See Comments)    Abnormal behavior    FAMILY HISTORY:  Family History  Problem Relation Age of Onset  . Alzheimer's disease Mother   . Cancer Father     prostate  . Cancer Brother     prostate  . Stroke Brother    SOCIAL HISTORY:  reports that he has been smoking Cigarettes.  He has a 50 pack-year smoking history. He has never used smokeless tobacco. He reports that he drinks about 4.2 ounces of alcohol per week. He reports that he does not use illicit drugs.  SUBJECTIVE:  Feeling a bit better, still with wheeze and thick white sputum  VITAL SIGNS: Temp:  [96.9 F (36.1 C)-98.1 F (36.7 C)] 97.8 F (36.6 C) (04/29 1145) Pulse Rate:  [77-90] 88 (04/29 1145) Resp:  [16-21] 16 (04/29 1145) BP: (136-156)/(49-96) 156/70 mmHg (04/29 1145) SpO2:  [92 %-98 %] 96 % (04/29 1145) Weight:  [63.5 kg (139 lb 15.9 oz)] 63.5 kg (139 lb 15.9 oz) (04/29 0341)  PHYSICAL EXAMINATION: General:  Ill-appearing man, NAD Neuro:  Awake and interacting, non-focal HEENT:  OP clear Neck:  Healing thyroid incision, clean Cardiovascular:  Distant, no MRG Lungs:  Distant, some coarse exp wheezes Abdomen:  benign Musculoskeletal:  No deformities Skin:  No rash   Recent Labs Lab 10/16/12 0707 10/17/12 0656 10/18/12 0545  NA 127* 125* 125*  K 4.5 3.3* 3.4*  CL 77* 77* 77*  CO2 38* 38* 39*  BUN 13 23 26*  CREATININE 1.18 1.34 1.31  GLUCOSE 129* 116* 116*    Recent Labs Lab 10/15/12 1833 10/15/12 2148 10/18/12 0545  HGB 10.7* 10.8* 11.8*  HCT 30.0* 36.1* 34.4*  WBC 13.9* 12.5* 27.4*  PLT 271 267 327   Dg Swallowing Func-speech Pathology  10/17/2012  Breck Coons [place], CCC-SLP     10/17/2012 10:46 AM Objective Swallowing Evaluation: Modified Barium Swallowing Study   Patient Details  Name: [name] [name] MRN: [id number] Date of Birth: [birth year]  Today's Date: 10/17/2012 Time: 0950-1018 SLP Time Calculation (min): 28 min  Past Medical History:  Past Medical History  Diagnosis Date  . Hypertension   . Hernia   . Hyperlipidemia   . Peripheral vascular disease   . Shoulder fracture, left   . At risk for sudden cardiac death, due to [facility] 04-12-12  . CHF (congestive heart failure)   .  Cancer     skin cancer  . Shortness of breath   . Stroke 2004    minor  . Dysrhythmia     PAF  . COPD (chronic obstructive pulmonary disease)     PT DENIES USES O2 2 LITERS HS  . Heart murmur   . Hepatitis     AT AGE  46; unknown type    . Heart attack     03/17/12; subtotally occluded RCI with unsucessful PCI  . Coronary artery disease     Cardiologist is Dr. Nanetta Batty    Past Surgical History:  Past Surgical History  Procedure Laterality Date  . Bypass graft  07/2010    axillobifemoral BPG  . Spine surgery  11/23/06    microdiscectomy L5-S1  . Hemorrhoid surgery    . Tonsillectomy    . Hernia repair  02/09/11    Ventral  . Cardiac catheterization      2013  . Coronary angioplasty      2013  . Thyroidectomy Bilateral 10/07/2012    Procedure: THYROIDECTOMY;  Surgeon: Darletta Moll, MD;  Location:  [facility] OR;  Service: ENT;  Laterality: Bilateral;  . Radical neck dissection Left 10/07/2012    Procedure: RADICAL NECK DISSECTION;  Surgeon: Darletta Moll, MD;   Location: [facility] OR;  Service: ENT;  Laterality: Left;   HPI:  72 y.o. 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 Pt noted to have had extensive  thyroidectomy approx 1 week ago with rather extensive involvement  into L jugular and internal carotid.  CXR Cardiomegaly with  increased pleural effusions and interstitial Kerley B lines which  may indicate developing  interstitial pulmonary edema.  MBS  recommended.     Assessment / Plan / Recommendation Clinical Impression  Dysphagia Diagnosis: Moderate pharyngeal phase dysphagia;Mild  cervical esophageal phase dysphagia Clinical impression: Pt. exhibits a moderate pharyngeal phase  dysphagia and mild cervical esophageal dysphagia.  Pharyngeal  impairments are impacted by motor dysfunction leading to  decreased laryngeal closure and silent aspiration with thin and  nectar consistencies.  Chin tuck posture did not facilitate  swallow and resulted in continued penetration/aspiration.   Vallecular and pyriform sinus residue was min-mild overall and  mild-moderate vallecular residue with honey thick.  A verbal cue  for second swallow cleared majority of residue.  Upper cervical  esophageal sphincter did not allow complete passage of bolus into  esophagus with intermittent pyriform sinus.  Prognosis/timeframe  for return to thin liquids not known with possible vagus nerve  involvement post surgery (per post-op note).  Recommend honey  thick liquids (pt. has verbalized a strong dislike for thick  liquids and may refuse honey thick) and Dys 3 diet (energy  conservation).  SLP will follow for toleration of diet and  further education.      Treatment Recommendation  Therapy as outlined in treatment plan below    Diet Recommendation Dysphagia 3 (Mechanical Soft);Honey-thick  liquid   Liquid Administration via: Cup Medication Administration: Whole meds with puree Supervision: Patient able to self feed;Intermittent supervision  to cue for compensatory strategies Compensations: Slow rate;Small sips/bites;Multiple dry swallows  after each bite/sip Postural Changes and/or Swallow Maneuvers: Seated upright 90  degrees    Other  Recommendations Recommended Consults: MBS Oral Care Recommendations: Oral care BID   Follow Up Recommendations  Home health SLP    Frequency and Duration min 2x/week  2 weeks   Pertinent Vitals/Pain     SLP Swallow Goals  Patient will utilize recommended strategies during swallow to  increase swallowing safety with: Minimal cueing Goal #3: Pt. will perform pharyngeal exercises to strengthen  musculature with min verbal cues.      Reason for Referral Objectively evaluate swallowing function   Oral Phase Oral Preparation/Oral Phase Oral Phase: WFL   Pharyngeal Phase Pharyngeal Phase Pharyngeal Phase: Impaired Pharyngeal - Honey Pharyngeal - Honey Teaspoon: Pharyngeal residue -  valleculae;Pharyngeal residue - pyriform sinuses;Reduced  laryngeal elevation;Reduced tongue base retraction Pharyngeal - Honey Cup: Pharyngeal residue -  valleculae;Pharyngeal residue - pyriform sinuses;Reduced  laryngeal elevation;Reduced tongue base retraction Pharyngeal - Nectar Pharyngeal - Nectar Teaspoon: Reduced laryngeal elevation;Reduced  anterior laryngeal mobility;Reduced airway/laryngeal  closure;Penetration/Aspiration during swallow Penetration/Aspiration details (nectar teaspoon): Material enters  airway, CONTACTS cords and not ejected out;Material enters  airway, remains ABOVE vocal cords then ejected out;Material  enters airway, remains ABOVE vocal cords and not ejected out Pharyngeal - Nectar Cup: Pharyngeal residue -  valleculae;Pharyngeal residue - pyriform sinuses;Reduced  airway/laryngeal closure;Reduced laryngeal elevation;Reduced  anterior laryngeal mobility;Penetration/Aspiration during  swallow;Trace aspiration Penetration/Aspiration details (nectar cup): Material enters  airway, passes BELOW cords without attempt by patient to eject  out (silent aspiration);Material enters airway, remains ABOVE  vocal cords and not ejected out;Material enters airway, remains  ABOVE vocal cords then ejected out Pharyngeal - Thin Pharyngeal - Thin Teaspoon: Not tested Pharyngeal - Thin Cup: Penetration/Aspiration during  swallow;Reduced laryngeal elevation;Reduced anterior laryngeal  mobility;Reduced airway/laryngeal closure;Trace  aspiration;Pharyngeal  residue - valleculae;Pharyngeal residue -  pyriform sinuses (trace-min residue) Penetration/Aspiration details (thin cup): Material enters  airway, passes BELOW cords without attempt by patient to eject  out (silent aspiration);Material enters airway, remains ABOVE  vocal cords and not ejected out;Material enters airway, remains  ABOVE vocal cords then ejected out Pharyngeal - Solids Pharyngeal - Regular: Pharyngeal residue - valleculae;Reduced  tongue base retraction  Cervical Esophageal Phase    GO    Cervical Esophageal Phase Cervical Esophageal Phase: Impaired (intermittent backflow into  pyriform sinuses)         Darrow Bussing.Ed CCC-SLP Pager [phone]  10/17/2012     ASSESSMENT / PLAN: 1- AE COPD 2- CAD + acute-on-chronic combined CHF 3- PAH secondary to both of the above, likely explains chronically elevated BNP 4- ? Chronic aspiration contributing to his decompensation  Recs:  - agree with therapy for his AE-COPD; added back Spiriva 4/28 - he has been diuresed and appears to be close to euvolemia - TTE ordered and to be reviewed by Dr Herbie [place] - modified diet ordered  Levy Pupa, MD, PhD 10/18/2012, 12:55 PM [facility] [phone] or if no answer [phone]

## 2012-10-18 NOTE — Progress Notes (Addendum)
[facility] Progress Note [place] TEAM 1 - Stepdown/ICU TEAM   JOBANI SABADO AVW:[id number] DOB: 1941-01-31 DOA: 10/15/2012 PCP: Kaleen Mask, MD  HPI: 72 y.o. year old male with significant past medical history of multiple medical problems including CHF (mixed), CAD s/p MI, COPD, thyroid cancer s/p thyroid surgery approx 1 week ago who presented with cough & SOB x 2 days. Pt lives at home with some assistance from [place], daughter Gloris [place]). Per report, daughter stated that she found pt having persistent SOB while at home. Had O2 level checked at home with O2 sats in low 80s. Pt wears O2 at night in setting of COPD. Has had to wear consistently for 24 hours to help with breathing. No CP per pt. Pt noted to have had extensive thyroidectomy approx 1 week prior to admission with rather extensive involvement into L jugular and internal carotid regions (please see op note for further details). Minimal neck pain and swelling per pt/daughter. Cough had been somewhat productive. No fevers or chills. Pt denied any cigarette intake, but then reported intermittent smoking, "borrowing" cigarettes from brother. Pt denied any excess salt intake, or NSAID use.   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 this admit    Acute on chronic combined systolic and diastolic congestive heart failure/NYHA Class 2/DHF Grade 2 -EF has decreased slightly to 30-35% but could also be interpretor variant -new finding of grade 2 DHF - appreciate [facility] eval  - euvolemic so Cards has dc'd lasix    PVD, Lt Ax-fem and fem-fem BPG 2/12, Dr Edilia Bo follows - asymptomatic at this time  Mild acute delirium  -likely due to steroids and hypo Ca+ - follow    HTN (hypertension) - stable     Hypocalcemia - corrected calcium is 7.4 - cont to replace with oral calcium TID -consider increase to QID to provide 2000 mg elemental Ca+ daily -replete magnesium- phosphorus normal -give 1x dose IV calcium gluconate today (4/29)  Hyponatremia/hypochloremia/metabolic alkalosis - likely due to fluid over load and severe hypocalcemia - Cards has initiated FR (4/29)    Thyroid cancer - s/p total thyroidectomy with left modified radical neck dissection    CAD (coronary artery disease) - cont ASA    H/O Tobacco abuse  Code Status: full code Family Communication: discussed w/ pt and daughter at bedside Disposition Plan: SDU until Ca+ improved  Consultants: Cardiology Pulmonary  Procedures: 4/28 TTE - pending  Antibiotics: Levaquin 4/27 >>  DVT prophylaxis: SQ Heparin  HPI/Subjective: Pt without complaints  Objective: Blood pressure 156/70, pulse 88, temperature 97.8 F (36.6 C), temperature source Oral, resp. rate 16, height 5\' 6"  (1.676 m), weight 63.5 kg (139 lb 15.9 oz), SpO2 96.00%.  Intake/Output Summary (Last 24 hours) at 10/18/12 1350 Last data filed at 10/18/12 1145  Gross per 24 hour  Intake    393 ml  Output   1600 ml  Net  -1207 ml   Exam: General: AAO X3, No acute respiratory distress Lungs: mild wheezing - no crackles  Cardiovascular: Regular rate and rhythm without murmur gallop or rub  Abdomen: Nontender, nondistended, soft, bowel sounds positive, no rebound, no ascites, no appreciable mass Extremities: No significant cyanosis, clubbing, or edema bilateral lower extremities  Data Reviewed: Basic Metabolic Panel:  Recent Labs Lab 10/15/12 1833 10/15/12 2148 10/16/12  1191 10/17/12 0656 10/17/12 1040 10/18/12 0545  NA 125*  --  127* 125*  --  125*  K 3.1*  --  4.5 3.3*  --  3.4*  CL 78*  --  77* 77*  --  77*  CO2 37*  --  38* 38*  --  39*  GLUCOSE 103*  --  129* 116*  --  116*  BUN 14  --  13 23  --  26*  CREATININE 1.28 1.28 1.18 1.34   --  1.31  CALCIUM 6.6*  --  6.9* 6.9*  --  6.7*  MG  --   --   --   --  1.6  --   PHOS  --   --   --   --  4.3  --    Liver Function Tests:  Recent Labs Lab 10/16/12 0707 10/17/12 0656  AST 26 28  ALT 16 18  ALKPHOS 83 77  BILITOT 0.4 0.4  PROT 7.6 7.8  ALBUMIN 3.4* 3.5   CBC:  Recent Labs Lab 10/15/12 1833 10/15/12 2148 10/18/12 0545  WBC 13.9* 12.5* 27.4*  NEUTROABS 10.4*  --   --   HGB 10.7* 10.8* 11.8*  HCT 30.0* 36.1* 34.4*  MCV 81.7 95.5 82.1  PLT 271 267 327   Cardiac Enzymes:  Recent Labs Lab 10/15/12 1840 10/16/12 0058 10/16/12 0707 10/16/12 1258  TROPONINI <0.30 <0.30 <0.30 <0.30   BNP (last 3 results)  Recent Labs  08/29/12 1035 10/15/12 1833 10/17/12 1908  PROBNP 7762.0* 13354.0* 23367.0*     Recent Results (from the past 240 hour(s))  MRSA PCR SCREENING     Status: None   Collection Time    10/16/12  1:26 AM      Result Value Range Status   MRSA by PCR NEGATIVE  NEGATIVE Final   Comment:            The GeneXpert MRSA Assay (FDA     approved for NASAL specimens     only), is one component of a     comprehensive MRSA colonization     surveillance program. It is not     intended to diagnose MRSA     infection nor to guide or     monitor treatment for     MRSA infections.     Studies:  Recent x-ray studies have been reviewed in detail by the Attending Physician  Scheduled Meds:  Scheduled Meds: . aspirin EC  81 mg Oral Daily  . calcium carbonate  1 tablet Oral TID WC  . clindamycin  300 mg Oral QID  . clopidogrel  75 mg Oral Daily  . gabapentin  600 mg Oral BID  . heparin  5,000 Units Subcutaneous Q8H  . isosorbide mononitrate  30 mg Oral Daily  . levofloxacin  500 mg Oral q1800  . levothyroxine  100 mcg Oral QAC breakfast  . losartan  25 mg Oral Daily  . metoprolol succinate  25 mg Oral Daily  . pantoprazole  40 mg Oral Q1200  . potassium chloride  10 mEq Oral Daily  . predniSONE  40 mg Oral Q breakfast  .  ranolazine  500 mg Oral BID  . sodium chloride  3 mL Intravenous Q12H  . tiotropium  18 mcg Inhalation Daily   Continuous Infusions:   Time spent on care of this patient: 35 min   Maximiliano Cromartie L.,ANP [phone]  [facility] Office  [phone] Pager - Text Page per Loretha Stapler as  per below:  On-Call/Text Page:      [email or web address]      password [email or web address]  If 7PM-7AM, please contact night-coverage [email or web address] Password [email or web address] 10/18/2012, 1:50 PM   LOS: 3 days      I have examined the patient, reviewed the chart and modified the above note which I agree with.   RIZWAN,SAIMA,MD [phone] 10/18/2012, 4:54 PM

## 2012-10-18 NOTE — Progress Notes (Signed)
Speech Language Pathology Dysphagia Treatment Patient Details Name: [name] [name] MRN: [id number] DOB: Aug 19, 1940 Today's Date: 10/18/2012 Time: 1478-2956 SLP Time Calculation (min): 8 min  Assessment / Plan / Recommendation Clinical Impression  Pt. appears bright this afternoon with daughter at bedside and reported the thick liquids are tolerable.  Just completed lunch and needed encouragement to take one more sip for SLP for SLP to observe.  He needed verbal/visula cues to not extend head back while swallowing and reported "that's what I thought I was supposed to do." (somewhat confused yesterday).  Pt. also needed moderate prompts to recall and perform 2nd swallow.  No s/s aspiration during brief observation of honey thick milk.  SLP educated pt. and daughter that thickened liquids is hopefully short term until he is back at baseline functioning and is hopfully able to compensate with thin liquids ( ? if he is a chronic aspirator; not certain of but is at higher aspiration risk given COPD).  Alsp educated daughter on how to thicken and where to purchase thick it (pt. received phone call).  SLP will continue to follow for safety and efficiency with diet and further education.       Diet Recommendation  Continue with Current Diet: Dysphagia 3 (mechanical soft);Honey-thick liquid    SLP Plan Continue with current plan of care   Pertinent Vitals/Pain none   Swallowing Goals  SLP Swallowing Goals Patient will utilize recommended strategies during swallow to increase swallowing safety with: Minimal cueing Swallow Study Goal #2 - Progress: Progressing toward goal Goal #3: Pt. will perform pharyngeal exercises to strengthen musculature with min verbal cues. Swallow Study Goal #3 - Progress: Progressing toward goal  General Temperature Spikes Noted: No Respiratory Status: Supplemental O2 delivered via (comment) Behavior/Cognition: Alert;Pleasant mood;Cooperative Oral Cavity - Dentition:  Adequate natural dentition Patient Positioning: Upright in bed  Oral Cavity - Oral Hygiene Does patient have any of the following "at risk" factors?: Diet - patient on thickened liquids;Other - dysphagia;Oxygen therapy - cannula, mask, simple oxygen devices Brush patient's teeth BID with toothbrush (using toothpaste with fluoride): Yes Patient is HIGH RISK - Oral Care Protocol followed (see row info): Yes   Dysphagia Treatment Treatment focused on: Skilled observation of diet tolerance;Patient/family/caregiver education;Utilization of compensatory strategies Family/Caregiver Educated: daughter Treatment Methods/Modalities: Skilled observation Patient observed directly with PO's: Yes Type of PO's observed: Honey-thick liquids Feeding: Able to feed self Liquids provided via: Cup;No straw Type of cueing: Verbal;Visual Amount of cueing: Moderate   GO     Breck Coons [address].Ed [employer] [phone]  10/18/2012

## 2012-10-18 NOTE — Progress Notes (Signed)
[facility]  Subjective: Continues to note breathing difficulty, but endorses some improvement in SOB.  Still feels "winded" at times & has a hard time lying down. Non-productive cough.  Less confused.  Objective: Vital signs in last 24 hours: Temp:  [96.9 F (36.1 C)-98.1 F (36.7 C)] 97.6 F (36.4 C) (04/29 0853) Pulse Rate:  [74-90] 78 (04/29 0853) Resp:  [18-21] 19 (04/29 0853) BP: (123-153)/(49-96) 139/91 mmHg (04/29 0853) SpO2:  [92 %-98 %] 96 % (04/29 0853) Weight:  [63.5 kg (139 lb 15.9 oz)] 63.5 kg (139 lb 15.9 oz) (04/29 0341) Last BM Date: 10/17/12  Intake/Output from previous day: 04/28 0701 - 04/29 0700 In: 243 [P.O.:240; I.V.:3] Out: 1975 [Urine:1975] Intake/Output this shift:    Medications Current Facility-Administered Medications  Medication Dose Route Frequency Provider Last Rate Last Dose  . albuterol (PROVENTIL) (5 MG/ML) 0.5% nebulizer solution 2.5 mg  2.5 mg Nebulization Q4H PRN Calvert Cantor, MD      . aspirin EC tablet 81 mg  81 mg Oral Daily Doree Albee, MD   81 mg at 10/17/12 0918  . calcium carbonate (OS-CAL - dosed in mg of elemental calcium) tablet 500 mg of elemental calcium  1 tablet Oral TID WC Calvert Cantor, MD   500 mg of elemental calcium at 10/17/12 1736  . calcium gluconate 2 g in sodium chloride 0.9 % 100 mL IVPB  2 g Intravenous Once Russella Dar, NP      . clindamycin (CLEOCIN) capsule 300 mg  300 mg Oral QID Doree Albee, MD   300 mg at 10/17/12 2205  . clopidogrel (PLAVIX) tablet 75 mg  75 mg Oral Daily Doree Albee, MD   75 mg at 10/17/12 0804  . food thickener (THICK IT) powder   Oral PRN Lonia Blood, MD      . gabapentin (NEURONTIN) tablet 600 mg  600 mg Oral BID Doree Albee, MD   600 mg at 10/17/12 2205  . heparin injection 5,000 Units  5,000 Units Subcutaneous Q8H Doree Albee, MD   5,000 Units at 10/18/12 0640  . isosorbide mononitrate (IMDUR) 24 hr tablet 30 mg  30 mg Oral Daily  Doree Albee, MD   30 mg at 10/17/12 0918  . levofloxacin (LEVAQUIN) tablet 500 mg  500 mg Oral q1800 Lonia Blood, MD   500 mg at 10/17/12 1735  . levothyroxine (SYNTHROID, LEVOTHROID) tablet 100 mcg  100 mcg Oral QAC breakfast Doree Albee, MD   100 mcg at 10/17/12 0804  . losartan (COZAAR) tablet 25 mg  25 mg Oral Daily Doree Albee, MD   25 mg at 10/17/12 1191  . magnesium sulfate IVPB 2 g 50 mL  2 g Intravenous Once Russella Dar, NP      . methylPREDNISolone sodium succinate (SOLU-MEDROL) 40 mg/mL injection 40 mg  40 mg Intravenous Q12H Russella Dar, NP   40 mg at 10/17/12 2203  . metoprolol succinate (TOPROL-XL) 24 hr tablet 25 mg  25 mg Oral Daily Doree Albee, MD   25 mg at 10/17/12 0918  . nitroGLYCERIN (NITROSTAT) SL tablet 0.4 mg  0.4 mg Sublingual Q5 Min x 3 PRN Doree Albee, MD      . oxyCODONE-acetaminophen (PERCOCET/ROXICET) 5-325 MG per tablet 1-2 tablet  1-2 tablet Oral Q4H PRN Doree Albee, MD   1 tablet at 10/17/12 2205  . pantoprazole (PROTONIX) EC tablet 40 mg  40 mg Oral Q1200 Doree Albee, MD   40 mg  at 10/17/12 1338  . potassium chloride SA (K-DUR,KLOR-CON) CR tablet 10 mEq  10 mEq Oral Daily Doree Albee, MD   10 mEq at 10/17/12 0918  . predniSONE (DELTASONE) tablet 40 mg  40 mg Oral Q breakfast Russella Dar, NP      . ranolazine (RANEXA) 12 hr tablet 500 mg  500 mg Oral BID Brittainy Simmons, PA-C   500 mg at 10/17/12 2205  . sodium chloride 0.9 % injection 3 mL  3 mL Intravenous Q12H Doree Albee, MD   3 mL at 10/17/12 2208  . tiotropium (SPIRIVA) inhalation capsule 18 mcg  18 mcg Inhalation Daily Leslye Peer, MD   18 mcg at 10/18/12 0846    PE: General appearance: alert, cooperative and no distress; was sleeping upright in bed this AM.  Frail, chronically ill appearing. Lungs: late inspiratory wheezing with expiratory rhonchi. Heart: regular rate and rhythm with intermittent ectopy.  Difficult to accurately auscultate due to breath  sounds. Pulses: 2+ and symmetric Skin: warm and dry Neurologic: Grossly normal  Lab Results:   Recent Labs  10/15/12 1833 10/15/12 2148 10/18/12 0545  WBC 13.9* 12.5* 27.4*  HGB 10.7* 10.8* 11.8*  HCT 30.0* 36.1* 34.4*  PLT 271 267 327   BMET  Recent Labs  10/16/12 0707 10/17/12 0656 10/18/12 0545  NA 127* 125* 125*  K 4.5 3.3* 3.4*  CL 77* 77* 77*  CO2 38* 38* 39*  GLUCOSE 129* 116* 116*  BUN 13 23 26*  CREATININE 1.18 1.34 1.31  CALCIUM 6.9* 6.9* 6.7*   BNP (last 3 results)  Recent Labs  08/29/12 1035 10/15/12 1833 10/17/12 1908  PROBNP 7762.0* 13354.0* 16109.6*    Assessment/Plan  Principal Problem:   Acute and chronic respiratory failure Active Problems:   Tobacco abuse, 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 Pro BNP remains quite elevated (higher that admission), despite this, he appears clinically euvolemic.  -- have d/c'd diuretics.  Stable renal Fxn, but Na dropped to 125 (chronic hyponatremia, was 127 on admission) -- have placed on fluid restriction (diet of honey thick liquids, mechanical soft ordered). May not need as aggressive K+ replacement with Lasix d/c'd, can resume PO lasix in a day or so.   Total net diuresis 3.4L  Will review echo for signs of elevated left heart pressure.  Continue to Rx what appears to mostly be COPD exacerbation.  ?? With honey thick liquid if he is a chronic aspirator.   LOS: 3 days   HARDING,DAVID W, M.D., M.S. [facility] [address] [place], [place]  [address]  [phone] Pager #  [phone] 10/18/2012 9:18 AM

## 2012-10-19 ENCOUNTER — Inpatient Hospital Stay (HOSPITAL_COMMUNITY): Payer: Medicare Other

## 2012-10-19 DIAGNOSIS — I509 Heart failure, unspecified: Secondary | ICD-10-CM

## 2012-10-19 LAB — RENAL FUNCTION PANEL
BUN: 38 mg/dL — ABNORMAL HIGH (ref 6–23)
CO2: 36 mEq/L — ABNORMAL HIGH (ref 19–32)
Calcium: 7 mg/dL — ABNORMAL LOW (ref 8.4–10.5)
Creatinine, Ser: 1.49 mg/dL — ABNORMAL HIGH (ref 0.50–1.35)
GFR calc non Af Amer: 45 mL/min — ABNORMAL LOW (ref >90)
Glucose, Bld: 112 mg/dL — ABNORMAL HIGH (ref 70–99)

## 2012-10-19 MED ORDER — ALBUTEROL SULFATE (5 MG/ML) 0.5% IN NEBU: 2.5000 mg | INHALATION_SOLUTION | RESPIRATORY_TRACT | Status: DC | PRN

## 2012-10-19 MED ORDER — CALCIUM CARBONATE 1250 (500 CA) MG PO TABS
2.0000 | ORAL_TABLET | Freq: Three times a day (TID) | ORAL | Status: DC
  Administered 2012-10-19 – 2012-10-22 (×9): 1000 mg via ORAL
  Filled 2012-10-19 (×13): qty 2

## 2012-10-19 MED ORDER — PREDNISONE 20 MG PO TABS
20.0000 mg | ORAL_TABLET | Freq: Every day | ORAL | Status: DC
  Administered 2012-10-20 – 2012-10-22 (×3): 20 mg via ORAL
  Filled 2012-10-19 (×5): qty 1

## 2012-10-19 MED ORDER — LEVOFLOXACIN 750 MG PO TABS
750.0000 mg | ORAL_TABLET | ORAL | Status: DC
  Administered 2012-10-20: 750 mg via ORAL
  Filled 2012-10-19 (×4): qty 1

## 2012-10-19 NOTE — Progress Notes (Signed)
Pt noted with blood on mouth. Pt states he bit left inside of mouth and tongue when eating yesterday. Area looks irritated and bloody. Merdis Delay NP notified.

## 2012-10-19 NOTE — Progress Notes (Signed)
Pt's pulse ox had not been picking up well at the beginning of the shift. After changing his probe site multiple time and getting a good wave form he would promptly pick up at 99% with the 2L/min . Then at about 1015 this morning the pt fell back to sleep and his 02 sats started to drop down into the 60's at times but mostly staying in the low 80's. There was good wave form on the pulse ox and so this is believed to be a true reading at this time. I placed the pt venti mask starting at the lowest of 28% and continued to increase all the way up to 50% VM to keep the pts 02 levels stable. RT was called and came to give his scheduled spiriva and also a prn albuterol treatment. MD and NP on the unit and came in the room to see the pt. No new orders given except for a f/u CXR to look for new aspiration. The pt is stable and 02 sats have stabilized with the 50% VM. Will continue to monitor.

## 2012-10-19 NOTE — Progress Notes (Signed)
[facility] Progress Note [place] TEAM 1 - Stepdown/ICU TEAM   VINICIUS BROCKMAN ZOX:[id number] DOB: 1940/09/21 DOA: 10/15/2012 PCP: Kaleen Mask, MD  HPI: 72 y.o. year old male with significant past medical history of multiple medical problems including CHF (mixed), CAD s/p MI, COPD, thyroid cancer s/p thyroid surgery approx 1 week ago who presented with cough & SOB x 2 days. Pt lives at home with some assistance from [place], daughter Gloris [place]). Per report, daughter stated that she found pt having persistent SOB while at home. Had O2 level checked at home with O2 sats in low 80s. Pt wears O2 at night in setting of COPD. Has had to wear consistently for 24 hours to help with breathing. No CP per pt. Pt noted to have had extensive thyroidectomy approx 1 week prior to admission with rather extensive involvement into L jugular and internal carotid regions (please see op note for further details). Minimal neck pain and swelling per pt/daughter. Cough had been somewhat productive. No fevers or chills. Pt denied any cigarette intake, but then reported intermittent smoking, "borrowing" cigarettes from brother. Pt denied any excess salt intake, or NSAID use.   Assessment/Plan:  Acute and chronic respiratory failure due to COPD exacerbation and suspected chronic aspiration -more hypoxic this am (4/30) with AMS/lethargy -check PCXR; ABG if lethargy recurs -cont steroids- ? taper to 30 mg in am -hypo Ca+ can cause bronchospasm -cont supportive care/nebs -Dr Delton Coombes also following-added back Spiriva (4/29) -mild pulmonary HTN on ECHO this admit (explains elevated pro BNP)  Acute on chronic combined systolic and diastolic congestive heart failure/NYHA Class 2/DHF Grade 2 -EF has decreased slightly to 30-35% but could also be interpreter variant -new finding of grade 2 DHF -appreciate [facility] eval  -euvolemic 4/29 so Cards dc'd lasix - 4/30 appears dry but will follow Scr - consider IVF if  renal fnx worsens  Bleeding -oozing blood from oral ulcers (? Apthous) and from lower abdominal SQ injection site -platelets are normal -Follow  PVD, Lt Ax-fem and fem-fem BPG 2/12, Dr Edilia Bo follows - asymptomatic at this time  Mild acute delirium  -likely due to steroids and hypo Ca+ - follow  HTN (hypertension) - stable  Hypocalcemia - corrected calcium is 7.7 (4/30) - cont to replace with oral calcium TID but will increase to 2 tabs TID - repleted magnesium - phosphorus normal  Hyponatremia/hypochloremia/metabolic alkalosis - likely due to fluid over load and severe hypocalcemia-better  Thyroid cancer - s/p total thyroidectomy with left modified radical neck dissection  CAD (coronary artery disease) - cont ASA  H/O Tobacco abuse  Code Status: FULL Family Communication: discussed w/ pt and daughter at bedside Disposition Plan: SDU  Consultants: Cardiology Pulmonary  Procedures: 4/28 TTE - ejection fraction was in the range of 30% to 35% - grade 2 diastolic dysfunction  Antibiotics: Levaquin 4/27 >>  DVT prophylaxis: SQ Heparin  HPI/Subjective: Patient is somewhat lethargic today.  He is able to be awakened by voice.  He himself denies any complaints but he appears to be in mild respiratory distress.  Objective: Blood pressure 154/70, pulse 71, temperature 98.5 F (36.9 C), temperature source Oral, resp. rate 16, height 5\' 6"  (1.676 m), weight 63.3 kg (139 lb 8.8 oz), SpO2 95.00%.  Intake/Output Summary (Last 24 hours) at 10/19/12 1252 Last data filed at 10/19/12 1200  Gross per 24 hour  Intake      0 ml  Output    451 ml  Net   -451 ml  Exam: General: AAO X3, mild respiratory distress with oxygen saturations 84-90% on Venturi mask Lungs: mild wheezing - no crackles , 50% VM - poor air movement throughout all fields Cardiovascular: Regular rate and rhythm without murmur gallop or rub  Abdomen: Nontender, nondistended, soft, bowel sounds positive,  no rebound, no ascites, no appreciable mass Extremities: No significant cyanosis, clubbing, or edema bilateral lower extremities  Data Reviewed: Basic Metabolic Panel:  Recent Labs Lab 10/15/12 1833 10/15/12 2148 10/16/12 0707 10/17/12 0656 10/17/12 1040 10/18/12 0545 10/19/12 0410  NA 125*  --  127* 125*  --  125* 130*  K 3.1*  --  4.5 3.3*  --  3.4* 3.5  CL 78*  --  77* 77*  --  77* 83*  CO2 37*  --  38* 38*  --  39* 36*  GLUCOSE 103*  --  129* 116*  --  116* 112*  BUN 14  --  13 23  --  26* 38*  CREATININE 1.28 1.28 1.18 1.34  --  1.31 1.49*  CALCIUM 6.6*  --  6.9* 6.9*  --  6.7* 7.0*  MG  --   --   --   --  1.6  --  2.3  PHOS  --   --   --   --  4.3  --  5.0*   Liver Function Tests:  Recent Labs Lab 10/16/12 0707 10/17/12 0656 10/19/12 0410  AST 26 28  --   ALT 16 18  --   ALKPHOS 83 77  --   BILITOT 0.4 0.4  --   PROT 7.6 7.8  --   ALBUMIN 3.4* 3.5 3.1*   CBC:  Recent Labs Lab 10/15/12 1833 10/15/12 2148 10/18/12 0545  WBC 13.9* 12.5* 27.4*  NEUTROABS 10.4*  --   --   HGB 10.7* 10.8* 11.8*  HCT 30.0* 36.1* 34.4*  MCV 81.7 95.5 82.1  PLT 271 267 327   Cardiac Enzymes:  Recent Labs Lab 10/15/12 1840 10/16/12 0058 10/16/12 0707 10/16/12 1258  TROPONINI <0.30 <0.30 <0.30 <0.30   BNP (last 3 results)  Recent Labs  08/29/12 1035 10/15/12 1833 10/17/12 1908  PROBNP 7762.0* 13354.0* 23367.0*     Recent Results (from the past 240 hour(s))  MRSA PCR SCREENING     Status: None   Collection Time    10/16/12  1:26 AM      Result Value Range Status   MRSA by PCR NEGATIVE  NEGATIVE Final   Comment:            The GeneXpert MRSA Assay (FDA     approved for NASAL specimens     only), is one component of a     comprehensive MRSA colonization     surveillance program. It is not     intended to diagnose MRSA     infection nor to guide or     monitor treatment for     MRSA infections.     Studies:  Recent x-ray studies have been reviewed  in detail by the Attending Physician  Scheduled Meds:  Scheduled Meds: . aspirin EC  81 mg Oral Daily  . calcium carbonate  2 tablet Oral TID WC  . clindamycin  300 mg Oral QID  . clopidogrel  75 mg Oral Daily  . gabapentin  600 mg Oral BID  . heparin  5,000 Units Subcutaneous Q8H  . isosorbide mononitrate  30 mg Oral Daily  . [START ON 10/20/2012] levofloxacin  750  mg Oral Q48H  . levothyroxine  100 mcg Oral QAC breakfast  . losartan  25 mg Oral Daily  . metoprolol succinate  25 mg Oral Daily  . pantoprazole  40 mg Oral Q1200  . potassium chloride  10 mEq Oral Daily  . predniSONE  40 mg Oral Q breakfast  . ranolazine  500 mg Oral BID  . sodium chloride  3 mL Intravenous Q12H  . tiotropium  18 mcg Inhalation Daily   Continuous Infusions:   Time spent on care of this patient: 35 min   ELLIS,ALLISON L.,ANP [phone]  [facility] Office  [phone] Pager - Text Page per Loretha Stapler as per below:  On-Call/Text Page:      [email or web address]      password [email or web address]  If 7PM-7AM, please contact night-coverage [email or web address] Password [email or web address] 10/19/2012, 12:52 PM   LOS: 4 days    I have personally examined this patient and reviewed the entire database. I have reviewed the above note, made any necessary editorial changes, and agree with its content.  Lonia Blood, MD [facility]

## 2012-10-19 NOTE — Progress Notes (Signed)
PULMONARY  / CRITICAL CARE MEDICINE  Name: RAHSHAWN REMO MRN: [id number] DOB: [birth year]    ADMISSION DATE:  10/15/2012 CONSULTATION DATE:  4/28  REFERRING MD :  [facility] PRIMARY SERVICE:  [facility]  CHIEF COMPLAINT:  Multifactorial dyspnea  BRIEF PATIENT DESCRIPTION: 72 yo man know to me from office w hx severe COPD, also with CAD and systolic + diastolic CHF and secondary PAH. Admitted 4/27. He is s/p recent thyroid resection for malignancy. Being treated for AE-COPD, also evaluated by [facility] for contribution of his cardiac disease.  SIGNIFICANT EVENTS / STUDIES:  Swallow eval 4/28 >> dysphagia, modified diet recommended TTE 4/29 >> LVEF 30-35%, grade 2 diastolic dysfxn, PASP ~  LINES / TUBES:   CULTURES:   ANTIBIOTICS: levaquin 4/27 >>   SUBJECTIVE/ INTERVAL EVENTS:  Pt noted to be hypoxemic this am - unclear whether this was related to poor SpO2 pick-up. He denies any dyspnea but he was reported to be lethargic and difficult to rouse. No associated CP.   VITAL SIGNS: Temp:  [97.7 F (36.5 C)-98.4 F (36.9 C)] 97.7 F (36.5 C) (04/30 0738) Pulse Rate:  [53-112] 53 (04/30 1022) Resp:  [13-23] 14 (04/30 1022) BP: (107-172)/(63-91) 172/70 mmHg (04/30 1022) SpO2:  [86 %-100 %] 88 % (04/30 1028) FiO2 (%):  [50 %] 50 % (04/30 1028) Weight:  [63.3 kg (139 lb 8.8 oz)] 63.3 kg (139 lb 8.8 oz) (04/30 0423)  PHYSICAL EXAMINATION: General:  Ill-appearing man, NAD but on 0.50 mask Neuro:  Awake and interacting, non-focal HEENT:  OP clear Neck:  Healing thyroid incision, clean Cardiovascular:  Distant, no MRG Lungs:  Good air movement, absolutely clear Abdomen:  benign Musculoskeletal:  No deformities Skin:  No rash   Recent Labs Lab 10/17/12 0656 10/18/12 0545 10/19/12 0410  NA 125* 125* 130*  K 3.3* 3.4* 3.5  CL 77* 77* 83*  CO2 38* 39* 36*  BUN 23 26* 38*  CREATININE 1.34 1.31 1.49*  GLUCOSE 116* 116* 112*    Recent Labs Lab 10/15/12 1833 10/15/12 2148  10/18/12 0545  HGB 10.7* 10.8* 11.8*  HCT 30.0* 36.1* 34.4*  WBC 13.9* 12.5* 27.4*  PLT 271 267 327   No results found.  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 Chronic aspiration contributing to his overall decompensation 5- acute desaturation this am, etiology unclear as lung exam is without wheeze or crackles, good air movement. ? Contribution of poor SpO2 monitoring.  Doubt PE but will consider in absence of other clear etiologies.  6- progressive renal insufficiency   Recs:  - agree with therapy for his AE-COPD; added back Spiriva 4/28, consider adding LABA/ICS also, although need to insure he can afford these as outpt - he has been diuresed and appears to be close to euvolemia - follow renal fxn, ? Whether he needs some gentle volume if S Cr continues to rise - stop nephrotoxins - ? Whether he needs to be on clinda (for skin boils) while he is on levaquin - if he becomes lethargic again, check ABG  Levy Pupa, MD, PhD 10/19/2012, 12:20 PM [facility] [phone] or if no answer [phone]

## 2012-10-20 LAB — BASIC METABOLIC PANEL
BUN: 31 mg/dL — ABNORMAL HIGH (ref 6–23)
CO2: 40 mEq/L (ref 19–32)
Chloride: 86 mEq/L — ABNORMAL LOW (ref 96–112)
Glucose, Bld: 93 mg/dL (ref 70–99)
Potassium: 3.3 mEq/L — ABNORMAL LOW (ref 3.5–5.1)
Sodium: 131 mEq/L — ABNORMAL LOW (ref 135–145)

## 2012-10-20 LAB — CBC
HCT: 34.9 % — ABNORMAL LOW (ref 39.0–52.0)
Hemoglobin: 11.6 g/dL — ABNORMAL LOW (ref 13.0–17.0)
MCH: 28.1 pg (ref 26.0–34.0)
MCHC: 33.2 g/dL (ref 30.0–36.0)
MCV: 84.5 fL (ref 78.0–100.0)
RBC: 4.13 MIL/uL — ABNORMAL LOW (ref 4.22–5.81)

## 2012-10-20 MED ORDER — FUROSEMIDE 40 MG PO TABS
40.0000 mg | ORAL_TABLET | Freq: Every day | ORAL | Status: DC
  Administered 2012-10-20 – 2012-10-22 (×3): 40 mg via ORAL
  Filled 2012-10-20 (×3): qty 1

## 2012-10-20 NOTE — Progress Notes (Signed)
Speech Language Pathology Dysphagia Treatment Patient Details Name: [name] [name] MRN: [id number] DOB: [birth year] Today's Date: 10/20/2012 Time: 4696-2952 SLP Time Calculation (min): 15 min  Assessment / Plan / Recommendation Clinical Impression  Observed pt. with part of lunch meal.  He recalled strategy of double swallow and required moderate verbal cues to perform.  Pt. consumed (in 1 mouthful) bite chicken, bite dressing, bite broccoli and bite of bread and SLP cueing him not to put more in oral cavity.  Prolonged oral prep, manipulation and transit.  Pt. given mod-max verbal rationale why he should eat one bite at a time.  Delayed throat clear as pt. masticating large bite likely due to post lingal spill.  SLP will continue to follow.     Diet Recommendation  Continue with Current Diet: Dysphagia 3 (mechanical soft);Honey-thick liquid    SLP Plan Continue with current plan of care   Pertinent Vitals/Pain none   Swallowing Goals  SLP Swallowing Goals Patient will utilize recommended strategies during swallow to increase swallowing safety with: Minimal cueing  General Temperature Spikes Noted: No Respiratory Status: Supplemental O2 delivered via (comment) Behavior/Cognition: Alert;Cooperative Oral Cavity - Dentition: Adequate natural dentition Patient Positioning: Upright in chair  Oral Cavity - Oral Hygiene Does patient have any of the following "at risk" factors?: Oxygen therapy - cannula, mask, simple oxygen devices Brush patient's teeth BID with toothbrush (using toothpaste with fluoride): Yes Patient is HIGH RISK - Oral Care Protocol followed (see row info): Yes   Dysphagia Treatment Treatment focused on: Skilled observation of diet tolerance;Utilization of compensatory strategies Treatment Methods/Modalities: Skilled observation Patient observed directly with PO's: Yes Type of PO's observed: Honey-thick liquids;Dysphagia 3 (soft) Feeding: Able to feed self Liquids  provided via: Cup;No straw Pharyngeal Phase Signs & Symptoms: Delayed throat clear Type of cueing: Verbal Amount of cueing: Total (MOD-MAX)   GO     Breck Coons Mack.Ed [employer] [phone]  10/20/2012

## 2012-10-20 NOTE — Progress Notes (Signed)
[facility] Progress Note [place] TEAM 1 - Stepdown/ICU TEAM   [name] [name] ZOX:[id number] DOB: Nov 09, 1940 DOA: 10/15/2012 PCP: [name] Mask, MD  HPI: 72 y.o. year old male with significant past medical history of multiple medical problems including CHF (mixed), CAD s/p MI, COPD, thyroid cancer s/p thyroid surgery approx 1 week ago who presented with cough & SOB x 2 days. Pt lives at home with some assistance from [place], daughter Gloris [place]). Per report, daughter stated that she found pt having persistent SOB while at home. Had O2 level checked at home with O2 sats in low 80s. Pt wears O2 at night in setting of COPD. Has had to wear consistently for 24 hours to help with breathing. No CP per pt. Pt noted to have had extensive thyroidectomy approx 1 week prior to admission with rather extensive involvement into L jugular and internal carotid regions (please see op note for further details). Minimal neck pain and swelling per pt/daughter. Cough had been somewhat productive. No fevers or chills. Pt denied any cigarette intake, but then reported intermittent smoking, "borrowing" cigarettes from brother. Pt denied any excess salt intake, or NSAID use.   Assessment/Plan:  Acute and chronic respiratory failure due to COPD exacerbation and suspected chronic aspiration -more hypoxic (4/30) so PCXR ordered(4/30) but "stuck" at exam ended -was more lethargic on 4/30 ? 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 Apthous) and from lower abdominal SQ injection site-NOW RESOLVED -platelets are normal  Deconditioning -PT/OT evaluation  PVD, Lt Ax-fem and fem-fem BPG 2/12, Dr Edilia Bo follows - asymptomatic at this time  Mild acute delirium  -likely due to steroids and hypo Ca+ - follow-RESOLVED  HTN (hypertension) - stable  Hypocalcemia - corrected calcium is 7.7 (4/30) and 7.5 (5/1) - cont to replace with oral calcium TID but will increase to 2 tabs TID - repleted magnesium - phosphorus normal-follow  Hyponatremia/hypochloremia/metabolic alkalosis - likely due to fluid over load and severe hypocalcemia-better  Thyroid cancer - s/p total thyroidectomy with left modified radical neck dissection  CAD (coronary artery disease) - cont ASA  H/O Tobacco abuse  Code Status: FULL Family Communication: discussed w/ pt and daughter at bedside Disposition Plan: Transfer to telemetry  Consultants: Cardiology Pulmonary  Procedures: 4/28 TTE - ejection fraction was in the range of 30% to 35% - grade 2 diastolic dysfunction  Antibiotics: Levaquin 4/27 >> Clindamycin 4/26 >>>  DVT prophylaxis: SQ Heparin  HPI/Subjective: Patient very alert this am-sitting up in chair and eating breakfast without difficulty.  Objective: Blood pressure 134/82, pulse 66, temperature 98.6 F (37 C), temperature source Oral, resp. rate 21, height 5\' 6"  (1.676 m), weight 63.3 kg (139 lb 8.8 oz), SpO2 99.00%.  Intake/Output Summary (Last  24 hours) at 10/20/12 1236 Last data filed at 10/20/12 0900  Gross per 24 hour  Intake   1183 ml  Output      0 ml  Net   1183 ml    Exam: General: AAO X3, mild respiratory distress with oxygen saturations 84-90% on Venturi [name] Lungs: mild wheezing - no crackles , 50% VM - poor air movement throughout all fields Cardiovascular: Regular rate and rhythm without murmur gallop or rub  Abdomen: Nontender, nondistended, soft, bowel sounds positive, no rebound, no ascites, no appreciable mass Extremities: No significant cyanosis, clubbing, or edema bilateral lower extremities  Data Reviewed: Basic Metabolic Panel:  Recent Labs Lab 10/16/12 0707 10/17/12 0656 10/17/12 1040 10/18/12 0545 10/19/12 0410 10/20/12 0425  NA 127* 125*  --  125* 130* 131*  K 4.5 3.3*  --  3.4* 3.5 3.3*  CL 77* 77*  --  77* 83* 86*  CO2 38* 38*  --  39* 36* 40*  GLUCOSE 129* 116*  --  116* 112* 93  BUN 13 23  --  26* 38* 31*  CREATININE 1.18 1.34  --  1.31 1.49* 1.37*  CALCIUM 6.9* 6.9*  --  6.7* 7.0* 6.8*  MG  --   --  1.6  --  2.3  --   PHOS  --   --  4.3  --  5.0*  --    Liver Function Tests:  Recent Labs Lab 10/16/12 0707 10/17/12 0656 10/19/12 0410  AST 26 28  --   ALT 16 18  --   ALKPHOS 83 77  --   BILITOT 0.4 0.4  --   PROT 7.6 7.8  --   ALBUMIN 3.4* 3.5 3.1*   CBC:  Recent Labs Lab 10/15/12 1833 10/15/12 2148 10/18/12 0545 10/20/12 0425  WBC 13.9* 12.5* 27.4* 17.2*  NEUTROABS 10.4*  --   --   --   HGB 10.7* 10.8* 11.8* 11.6*  HCT 30.0* 36.1* 34.4* 34.9*  MCV 81.7 95.5 82.1 84.5  PLT 271 267 327 326   Cardiac Enzymes:  Recent Labs Lab 10/15/12 1840 10/16/12 0058 10/16/12 0707 10/16/12 1258  TROPONINI <0.30 <0.30 <0.30 <0.30   BNP (last 3 results)  Recent Labs  08/29/12 1035 10/15/12 1833 10/17/12 1908  PROBNP 7762.0* 13354.0* 23367.0*     Recent Results (from the past 240 hour(s))  MRSA PCR SCREENING     Status: None   Collection Time    10/16/12  1:26 AM      Result Value Range Status   MRSA by PCR NEGATIVE  NEGATIVE Final   Comment:            The GeneXpert MRSA Assay (FDA      approved for NASAL specimens     only), is one component of a     comprehensive MRSA colonization     surveillance program. It is not     intended to diagnose MRSA     infection nor to guide or     monitor treatment for     MRSA infections.     Studies:  Recent x-ray studies have been reviewed in detail by the Attending Physician  Scheduled Meds:  Scheduled Meds: . aspirin EC  81 mg Oral Daily  . calcium carbonate  2 tablet Oral TID WC  . clindamycin  300 mg Oral QID  . clopidogrel  75 mg Oral Daily  . furosemide  40 mg Oral Daily  . gabapentin  600 mg  Oral BID  . heparin  5,000 Units Subcutaneous Q8H  . isosorbide mononitrate  30 mg Oral Daily  . levofloxacin  750 mg Oral Q48H  . levothyroxine  100 mcg Oral QAC breakfast  . metoprolol succinate  25 mg Oral Daily  . pantoprazole  40 mg Oral Q1200  . potassium chloride  10 mEq Oral Daily  . predniSONE  20 mg Oral Q breakfast  . ranolazine  500 mg Oral BID  . sodium chloride  3 mL Intravenous Q12H  . tiotropium  18 mcg Inhalation Daily   Continuous Infusions:   Time spent on care of this patient: 35 min   ELLIS,ALLISON L.,ANP [phone]  [facility] Office  [phone] Pager - Text Page per Loretha Stapler as per below:  On-Call/Text Page:      [email or web address]      password [email or web address]  If 7PM-7AM, please contact night-coverage [email or web address] Password [facility] 10/20/2012, 12:36 PM   LOS: 5 days    I have examined the patient, reviewed the chart and modified the above note which I agree with.   Daylin Eads,MD [phone] 10/20/2012, 4:26 PM

## 2012-10-20 NOTE — Progress Notes (Signed)
Patient transferred from 2600 to 4729 via wheelchair. VSS and oriented patient to heart failure unit and call bell. Patient stated has watched heart failure video for he has been on this unit before and states he understands. Currently patient has no pain or discomfort and will continue to monitor to end of shift.

## 2012-10-20 NOTE — Progress Notes (Signed)
[facility]  DAILY PROGRESS NOTE   Subjective:  Feels almost back to breathing baseline. Walked three laps of the unit yesterday afternoon. No further confusion. I&O about even. Na and creat are both better. Note that BNP on 4/28 and echo on 4/29 both suggested elevated mean left atrial pressure.  Objective:  Temp:  [97.5 F (36.4 C)-98.5 F (36.9 C)] 97.5 F (36.4 C) (05/01 0800) Pulse Rate:  [53-77] 77 (05/01 0954) Resp:  [10-20] 20 (05/01 0800) BP: (123-172)/(63-86) 130/80 mmHg (05/01 0954) SpO2:  [86 %-100 %] 97 % (05/01 0903) FiO2 (%):  [50 %] 50 % (04/30 1200) Weight:  [63.3 kg (139 lb 8.8 oz)] 63.3 kg (139 lb 8.8 oz) (05/01 0348) Weight change: 0 kg (0 lb)  Intake/Output from previous day: 04/30 0701 - 05/01 0700 In: 823 [P.O.:820; I.V.:3] Out: 100 [Urine:100]  Intake/Output from this shift: Total I/O In: 360 [P.O.:360] Out: -   Medications: Current Facility-Administered Medications  Medication Dose Route Frequency Provider Last Rate Last Dose  . albuterol (PROVENTIL) (5 MG/ML) 0.5% nebulizer solution 2.5 mg  2.5 mg Nebulization Q2H PRN Lonia Blood, MD      . aspirin EC tablet 81 mg  81 mg Oral Daily Doree Albee, MD   81 mg at 10/20/12 0954  . calcium carbonate (OS-CAL - dosed in mg of elemental calcium) tablet 1,000 mg of elemental calcium  2 tablet Oral TID WC Lonia Blood, MD   1,000 mg of elemental calcium at 10/20/12 0843  . clindamycin (CLEOCIN) capsule 300 mg  300 mg Oral QID Doree Albee, MD   300 mg at 10/20/12 0956  . clopidogrel (PLAVIX) tablet 75 mg  75 mg Oral Daily Doree Albee, MD   75 mg at 10/20/12 0843  . food thickener (THICK IT) powder   Oral PRN Lonia Blood, MD      . gabapentin (NEURONTIN) tablet 600 mg  600 mg Oral BID Doree Albee, MD   600 mg at 10/20/12 0955  . heparin injection 5,000 Units  5,000 Units Subcutaneous Q8H Doree Albee, MD   5,000 Units at 10/20/12 0517  . isosorbide  mononitrate (IMDUR) 24 hr tablet 30 mg  30 mg Oral Daily Doree Albee, MD   30 mg at 10/20/12 0954  . levofloxacin (LEVAQUIN) tablet 750 mg  750 mg Oral Q48H Lonia Blood, MD      . levothyroxine (SYNTHROID, LEVOTHROID) tablet 100 mcg  100 mcg Oral QAC breakfast Doree Albee, MD   100 mcg at 10/20/12 0820  . metoprolol succinate (TOPROL-XL) 24 hr tablet 25 mg  25 mg Oral Daily Doree Albee, MD   25 mg at 10/20/12 0954  . nitroGLYCERIN (NITROSTAT) SL tablet 0.4 mg  0.4 mg Sublingual Q5 Min x 3 PRN Doree Albee, MD      . oxyCODONE-acetaminophen (PERCOCET/ROXICET) 5-325 MG per tablet 1-2 tablet  1-2 tablet Oral Q4H PRN Doree Albee, MD   1 tablet at 10/19/12 2137  . pantoprazole (PROTONIX) EC tablet 40 mg  40 mg Oral Q1200 Doree Albee, MD   40 mg at 10/19/12 1257  . potassium chloride SA (K-DUR,KLOR-CON) CR tablet 10 mEq  10 mEq Oral Daily Doree Albee, MD   10 mEq at 10/20/12 0955  . predniSONE (DELTASONE) tablet 20 mg  20 mg Oral Q breakfast Lonia Blood, MD   20 mg at 10/20/12 0843  . ranolazine (RANEXA) 12 hr tablet 500 mg  500 mg Oral BID  Brittainy Simmons, PA-C   500 mg at 10/20/12 0955  . sodium chloride 0.9 % injection 3 mL  3 mL Intravenous Q12H Doree Albee, MD   3 mL at 10/19/12 2137  . tiotropium (SPIRIVA) inhalation capsule 18 mcg  18 mcg Inhalation Daily Leslye Peer, MD   18 mcg at 10/20/12 1610    Physical Exam: General appearance: alert, cooperative and no distress Neck: JVD - 4 cm above sternal notch, no adenopathy, no carotid bruit, supple, symmetrical, trachea midline and thyroid not enlarged, symmetric, no tenderness/mass/nodules Lungs: clear to auscultation bilaterally Heart: regular rate and rhythm, S1, S2 normal, no murmur, click, rub or gallop and occasional ectopy Abdomen: soft, non-tender; bowel sounds normal; no masses,  no organomegaly Extremities: extremities normal, atraumatic, no cyanosis or edema Pulses: 2+ and symmetric Skin: Skin color,  texture, turgor normal. No rashes or lesions Neurologic: Grossly normal  Lab Results: Results for orders placed during the hospital encounter of 10/15/12 (from the past 48 hour(s))  RENAL FUNCTION PANEL     Status: Abnormal   Collection Time    10/19/12  4:10 AM      Result Value Range   Sodium 130 (*) 135 - 145 mEq/L   Potassium 3.5  3.5 - 5.1 mEq/L   Chloride 83 (*) 96 - 112 mEq/L   CO2 36 (*) 19 - 32 mEq/L   Glucose, Bld 112 (*) 70 - 99 mg/dL   BUN 38 (*) 6 - 23 mg/dL   Creatinine, Ser 9.60 (*) 0.50 - 1.35 mg/dL   Calcium 7.0 (*) 8.4 - 10.5 mg/dL   Phosphorus 5.0 (*) 2.3 - 4.6 mg/dL   Albumin 3.1 (*) 3.5 - 5.2 g/dL   GFR calc non Af Amer 45 (*) >90 mL/min   GFR calc Af Amer 53 (*) >90 mL/min   Comment:            The eGFR has been calculated     using the CKD EPI equation.     This calculation has not been     validated in all clinical     situations.     eGFR's persistently     <90 mL/min signify     possible Chronic Kidney Disease.  MAGNESIUM     Status: None   Collection Time    10/19/12  4:10 AM      Result Value Range   Magnesium 2.3  1.5 - 2.5 mg/dL  BASIC METABOLIC PANEL     Status: Abnormal   Collection Time    10/20/12  4:25 AM      Result Value Range   Sodium 131 (*) 135 - 145 mEq/L   Potassium 3.3 (*) 3.5 - 5.1 mEq/L   Chloride 86 (*) 96 - 112 mEq/L   CO2 40 (*) 19 - 32 mEq/L   Comment: CRITICAL RESULT CALLED TO, READ BACK BY AND VERIFIED WITH:     HANCOCK M RN 10/20/12 0630 COSTELLO B   Glucose, Bld 93  70 - 99 mg/dL   BUN 31 (*) 6 - 23 mg/dL   Creatinine, Ser 4.54 (*) 0.50 - 1.35 mg/dL   Calcium 6.8 (*) 8.4 - 10.5 mg/dL   GFR calc non Af Amer 50 (*) >90 mL/min   GFR calc Af Amer 58 (*) >90 mL/min   Comment:            The eGFR has been calculated     using the CKD EPI equation.  This calculation has not been     validated in all clinical     situations.     eGFR's persistently     <90 mL/min signify     possible Chronic Kidney Disease.  CBC      Status: Abnormal   Collection Time    10/20/12  4:25 AM      Result Value Range   WBC 17.2 (*) 4.0 - 10.5 K/uL   RBC 4.13 (*) 4.22 - 5.81 MIL/uL   Hemoglobin 11.6 (*) 13.0 - 17.0 g/dL   HCT 08.6 (*) 57.8 - 46.9 %   MCV 84.5  78.0 - 100.0 fL   MCH 28.1  26.0 - 34.0 pg   MCHC 33.2  30.0 - 36.0 g/dL   RDW 62.9  52.8 - 41.3 %   Platelets 326  150 - 400 K/uL    Imaging: No results found.  Assessment:  1. Principal Problem: 2.   Acute and chronic respiratory failure 3. Active Problems: 4.   Tobacco abuse, pt quit after MI 5.   Acute on chronic combined systolic and diastolic congestive heart failure- EF of 35-40% via echo in 04/2012 6.   COPD, moderate 7.   PVD, Lt Ax-fem and fem-fem BPG 2/12, Dr Manson Passey follows 8.   HTN (hypertension) 9.   Dyslipidemia 10.   Peripheral vascular disease, unspecified 11.   Hypocalcemia 12.   Thyroid cancer 13.   COPD exacerbation 14.   CAD (coronary artery disease) 15.   Plan:  1. Clinically better, but there is evidence of hypervolemia by Doppler and BNP. Now that symptomatic hyponatremia has resolved will resume loop diuretics, but continue fluid restriction. Last dose of furosemide was > 24h ago. 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 2. Replace K 3. May soon be ready for DC planning  Time Spent Directly with Patient:  30 minutes  Length of Stay:  LOS: 5 days    Anthonny Schiller 10/20/2012, 9:57 AM

## 2012-10-20 NOTE — Progress Notes (Addendum)
PULMONARY  / CRITICAL CARE MEDICINE  Name: [name] [name] MRN: [id number] DOB: Oct 20, 1940    ADMISSION DATE:  10/15/2012 CONSULTATION DATE:  4/28  REFERRING MD :  [facility] PRIMARY SERVICE:  [facility]  CHIEF COMPLAINT:  Multifactorial dyspnea  BRIEF PATIENT DESCRIPTION: 72 yo man know to me from office w hx severe COPD, also with CAD and systolic + diastolic CHF and secondary PAH. Admitted 4/27. He is s/p recent thyroid resection for malignancy. Being treated for AE-COPD, also evaluated by [facility] for contribution of his cardiac disease.  SIGNIFICANT EVENTS / STUDIES:  Swallow eval 4/28 >> dysphagia, modified diet recommended TTE 4/29 >> LVEF 30-35%, grade 2 diastolic dysfxn, PASP ~  LINES / TUBES:   CULTURES:   ANTIBIOTICS: levaquin 4/27 >>   SUBJECTIVE/ INTERVAL EVENTS:  O2 weaned to 3L/min MS clearing, Na is improved   VITAL SIGNS: Temp:  [97.5 F (36.4 C)-98.5 F (36.9 C)] 97.5 F (36.4 C) (05/01 0800) Pulse Rate:  [53-74] 74 (05/01 0800) Resp:  [10-20] 20 (05/01 0800) BP: (123-172)/(63-86) 135/74 mmHg (05/01 0800) SpO2:  [86 %-100 %] 97 % (05/01 0903) FiO2 (%):  [50 %] 50 % (04/30 1200) Weight:  [63.3 kg (139 lb 8.8 oz)] 63.3 kg (139 lb 8.8 oz) (05/01 0348)  PHYSICAL EXAMINATION: General:  Ill-appearing man, NAD but on 0.50 mask Neuro:  Awake and interacting, non-focal HEENT:  OP clear Neck:  Healing thyroid incision, clean Cardiovascular:  Distant, no MRG Lungs:  Good air movement, absolutely clear Abdomen:  benign Musculoskeletal:  No deformities Skin:  No rash   Recent Labs Lab 10/18/12 0545 10/19/12 0410 10/20/12 0425  NA 125* 130* 131*  K 3.4* 3.5 3.3*  CL 77* 83* 86*  CO2 39* 36* 40*  BUN 26* 38* 31*  CREATININE 1.31 1.49* 1.37*  GLUCOSE 116* 112* 93    Recent Labs Lab 10/15/12 2148 10/18/12 0545 10/20/12 0425  HGB 10.8* 11.8* 11.6*  HCT 36.1* 34.4* 34.9*  WBC 12.5* 27.4* 17.2*  PLT 267 327 326   No results found.  ASSESSMENT /  PLAN: 1- AE COPD > improved on exam 2- CAD + acute-on-chronic combined CHF > euvolemic post diuresis this admission, diuretics held last several days due to hyponatremia now improving 3- PAH secondary to both of the above, likely explains chronically elevated BNP (baseline ~ 7000's) 4- ? Chronic aspiration contributing to his overall decompensation 5- acute desaturation 4/30, etiology unclear as lung exam is without wheeze or crackles, good air movement. ? Contribution of poor SpO2 monitoring.  Doubt PE but will consider in absence of other clear etiologies.  6- progressive renal insufficiency  7- hyponatremia, ? Contributing to MS change 8- hypercalcemia - better  Recs:  - agree with therapy for his AE-COPD; added back Spiriva 4/28, consider adding LABA/ICS also, although need to insure he can afford these as outpt - agree with weaning Pred to 30 and then 2 off over 2 weeks - he has been diuresed and appears to be close to euvolemia, baseline lasix to be restarted 5/1 - Potassium given - follow renal fxn - stop nephrotoxins - ? Whether he needs to be on clinda (for skin boils) while he is on levaquin, I'll leave this up to Dr Sharon Seller - if he becomes lethargic again, check ABG  Levy Pupa, MD, PhD 10/20/2012, 9:54 AM [facility] [phone] or if no answer [phone]

## 2012-10-21 DIAGNOSIS — J449 Chronic obstructive pulmonary disease, unspecified: Secondary | ICD-10-CM

## 2012-10-21 DIAGNOSIS — R0902 Hypoxemia: Secondary | ICD-10-CM

## 2012-10-21 DIAGNOSIS — J4489 Other specified chronic obstructive pulmonary disease: Secondary | ICD-10-CM

## 2012-10-21 DIAGNOSIS — R4182 Altered mental status, unspecified: Secondary | ICD-10-CM

## 2012-10-21 DIAGNOSIS — E785 Hyperlipidemia, unspecified: Secondary | ICD-10-CM

## 2012-10-21 DIAGNOSIS — J9600 Acute respiratory failure, unspecified whether with hypoxia or hypercapnia: Secondary | ICD-10-CM

## 2012-10-21 DIAGNOSIS — C73 Malignant neoplasm of thyroid gland: Secondary | ICD-10-CM

## 2012-10-21 DIAGNOSIS — I10 Essential (primary) hypertension: Secondary | ICD-10-CM

## 2012-10-21 LAB — RENAL FUNCTION PANEL
Albumin: 3 g/dL — ABNORMAL LOW (ref 3.5–5.2)
BUN: 26 mg/dL — ABNORMAL HIGH (ref 6–23)
CO2: 40 mEq/L (ref 19–32)
Chloride: 87 mEq/L — ABNORMAL LOW (ref 96–112)
Creatinine, Ser: 1.37 mg/dL — ABNORMAL HIGH (ref 0.50–1.35)
Potassium: 3.3 mEq/L — ABNORMAL LOW (ref 3.5–5.1)

## 2012-10-21 LAB — MAGNESIUM: Magnesium: 1.6 mg/dL (ref 1.5–2.5)

## 2012-10-21 MED ORDER — SALINE SPRAY 0.65 % NA SOLN
1.0000 | NASAL | Status: DC | PRN
  Filled 2012-10-21: qty 44

## 2012-10-21 MED ORDER — LORATADINE 10 MG PO TABS
10.0000 mg | ORAL_TABLET | Freq: Every day | ORAL | Status: DC
  Administered 2012-10-21 – 2012-10-22 (×2): 10 mg via ORAL
  Filled 2012-10-21 (×2): qty 1

## 2012-10-21 NOTE — Progress Notes (Signed)
Pt. Seen and examined. Agree with the NP/PA-C note as written.  Breathing much improved, seems to be back to baseline. He has chronic hyponatremia, likely due to SIADH. Remains hypokalemic - replete. Renal function stable. Probably close to discharge. Will be available as needed, but sign-off for now.  Call with questions.  Chrystie Nose, MD, [facility] Attending Cardiologist [facility]

## 2012-10-21 NOTE — Progress Notes (Signed)
CRITICAL VALUE ALERT  Critical value received:  CO2 of 40 and Calcium of 6.3  Date of notification:  10/21/12  Time of notification:  0805  Critical value read back:yes  Nurse who received alert:  Nino Glow RN  MD notified (1st page):  Cena Benton  Time of first page:  0810  MD notified (2nd page):  Time of second page:  Responding MD:  Cena Benton  Time MD responded:  [phone]   No new orders given at this time.

## 2012-10-21 NOTE — Progress Notes (Signed)
Subjective: No complaints, oxygen out due to nasal irritation and mild bleeding  Objective: Vital signs in last 24 hours: Temp:  [97.9 F (36.6 C)-98.6 F (37 C)] 98 F (36.7 C) (05/02 0449) Pulse Rate:  [48-121] 64 (05/02 0449) Resp:  [18-21] 20 (05/02 0449) BP: (113-177)/(60-96) 177/79 mmHg (05/02 0449) SpO2:  [84 %-100 %] 98 % (05/02 0449) Weight:  [135 lb 9.3 oz (61.5 kg)-135 lb 12.9 oz (61.6 kg)] 135 lb 12.9 oz (61.6 kg) (05/02 0449) Weight change: -3 lb 15.5 oz (-1.8 kg) Last BM Date: 10/20/12 Intake/Output from previous day: +239  Wt 61 Kg down from 63.6 on admit.  (-2968 since admit) 05/01 0701 - 05/02 0700 In: 840 [P.O.:840] Out: 601 [Urine:600; Stool:1] Intake/Output this shift:    PE: General: alert and oriented, feeling better  Neck:supple, mild JVD sitting upright Heart:[id number] occ, PAC and PVC on tele Lungs:no wheezes, still somewhat tight Abd:+ BS, soft, non tender Ext:no edema    Lab Results:  Recent Labs  10/20/12 0425  WBC 17.2*  HGB 11.6*  HCT 34.9*  PLT 326   BMET  Recent Labs  10/20/12 0425 10/21/12 0644  NA 131* 132*  K 3.3* 3.3*  CL 86* 87*  CO2 40* 40*  GLUCOSE 93 95  BUN 31* 26*  CREATININE 1.37* 1.37*  CALCIUM 6.8* 6.3*   No results found for this basename: TROPONINI, CK, MB,  in the last 72 hours  Lab Results  Component Value Date   CHOL 103 03/30/2012   HDL 48 03/17/2012   LDLCALC 58 03/17/2012   TRIG 85 03/17/2012   CHOLHDL 2.6 03/17/2012   Lab Results  Component Value Date   HGBA1C 5.4 03/17/2012     Lab Results  Component Value Date   TSH 2.506 03/25/2012    Hepatic Function Panel  Recent Labs  10/21/12 0644  ALBUMIN 3.0*   No results found for this basename: CHOL,  in the last 72 hours No results found for this basename: PROTIME,  in the last 72 hours      Studies/Results: No results found.  Medications: I have reviewed the patient's current medications. Scheduled Meds: . aspirin EC  81 mg Oral Daily   . calcium carbonate  2 tablet Oral TID WC  . clindamycin  300 mg Oral QID  . clopidogrel  75 mg Oral Daily  . furosemide  40 mg Oral Daily  . gabapentin  600 mg Oral BID  . heparin  5,000 Units Subcutaneous Q8H  . isosorbide mononitrate  30 mg Oral Daily  . levofloxacin  750 mg Oral Q48H  . levothyroxine  100 mcg Oral QAC breakfast  . metoprolol succinate  25 mg Oral Daily  . pantoprazole  40 mg Oral Q1200  . potassium chloride  10 mEq Oral Daily  . predniSONE  20 mg Oral Q breakfast  . ranolazine  500 mg Oral BID  . sodium chloride  3 mL Intravenous Q12H  . tiotropium  18 mcg Inhalation Daily   Continuous Infusions:  PRN Meds:.albuterol, food thickener, nitroGLYCERIN, oxyCODONE-acetaminophen  Assessment/Plan: Principal Problem:   Acute and chronic respiratory failure Active Problems:   Tobacco abuse, pt quit after MI   Acute on chronic combined systolic and diastolic congestive heart failure- EF of 35-40% via echo in 04/2012   COPD, moderate   PVD, Lt Ax-fem and fem-fem BPG 2/12, Dr Manson Passey follows   HTN (hypertension)   Dyslipidemia   Peripheral vascular disease, unspecified   Hypocalcemia  Thyroid cancer   COPD exacerbation   CAD (coronary artery disease)  PLAN:hypokalemia,  nasal irritation- add ocean spray nasal spray, ambulate  LOS: 6 days   Time spent with pt including MD time 20 minutes. [facility] R  Nurse Practitioner Certified Pager [phone] 10/21/2012, 8:21 AM

## 2012-10-21 NOTE — Progress Notes (Signed)
PULMONARY  / CRITICAL CARE MEDICINE  Name: [name] [name] MRN: [id number] DOB: 02-14-1941    ADMISSION DATE:  10/15/2012 CONSULTATION DATE:  4/28  REFERRING MD :  [facility] PRIMARY SERVICE:  [facility]  CHIEF COMPLAINT:  Multifactorial dyspnea  BRIEF PATIENT DESCRIPTION: 72 y/o man well known to PCCM from office w hx severe COPD, also with CAD and systolic + diastolic CHF and secondary PAH. Admitted 4/27. He is s/p recent thyroid resection for malignancy. Being treated for AE-COPD, also evaluated by [facility] for contribution of his cardiac disease.  SIGNIFICANT EVENTS / STUDIES:  Swallow eval 4/28 >> dysphagia, modified diet recommended TTE 4/29 >> LVEF 30-35%, grade 2 diastolic dysfxn, PASP ~  LINES / TUBES:   CULTURES:   ANTIBIOTICS: levaquin 4/27 >>   SUBJECTIVE/ INTERVAL EVENTS: "I feel like I have a cold".  C/o nasal drainage / congestion.  Has not had humidity to O2 -staff just added it.  He has small amt blood from nose.     VITAL SIGNS: Temp:  [97.9 F (36.6 C)-98.6 F (37 C)] 98 F (36.7 C) (05/02 0449) Pulse Rate:  [48-121] 64 (05/02 0449) Resp:  [18-21] 20 (05/02 0449) BP: (113-177)/(60-96) 177/79 mmHg (05/02 0449) SpO2:  [84 %-100 %] 98 % (05/02 0449) Weight:  [135 lb 9.3 oz (61.5 kg)-135 lb 12.9 oz (61.6 kg)] 135 lb 12.9 oz (61.6 kg) (05/02 0449)  PHYSICAL EXAMINATION: General:  Ill-appearing man, NAD on [facility]:  Awake and interacting, non-focal HEENT:  OP clear Neck:  Healing thyroid incision, clean Cardiovascular:  Distant, no MRG Lungs:  Good air movement, absolutely clear Abdomen:  benign Musculoskeletal:  No deformities Skin:  No rash   Recent Labs Lab 10/19/12 0410 10/20/12 0425 10/21/12 0644  NA 130* 131* 132*  K 3.5 3.3* 3.3*  CL 83* 86* 87*  CO2 36* 40* 40*  BUN 38* 31* 26*  CREATININE 1.49* 1.37* 1.37*  GLUCOSE 112* 93 95    Recent Labs Lab 10/15/12 2148 10/18/12 0545 10/20/12 0425  HGB 10.8* 11.8* 11.6*  HCT 36.1* 34.4*  34.9*  WBC 12.5* 27.4* 17.2*  PLT 267 327 326   No results found.  ASSESSMENT / PLAN: 1- AE COPD > improved on exam 2- CAD + acute-on-chronic combined CHF > euvolemic post diuresis this admission, diuretics held last several days due to hyponatremia now improving 3- PAH secondary to both of the above, likely explains chronically elevated BNP (baseline ~ 7000's) 4- ? Chronic aspiration contributing to his overall decompensation 5- acute desaturation 4/30, etiology unclear as lung exam is without wheeze or crackles, good air movement. ? Contribution of poor SpO2 monitoring.  Doubt PE but will consider in absence of other clear etiologies.  6- progressive renal insufficiency  7- hyponatremia, ? Contributing to MS change 8- hypercalcemia - better  Recs:  - agree with therapy for his AE-COPD; added back Spiriva 4/28, consider adding LABA/ICS also, although need to insure he can afford these as outpt - wean Prednisone to off over 2 weeks  - he has been diuresed and appears to be close to euvolemia, baseline lasix to be restarted 5/1 - Potassium given - follow renal fxn - ? Whether he needs to be on clinda (for skin boils) while he is on levaquin, I'll leave this up to Dr Sharon Seller - if he becomes lethargic again, check ABG - nasal saline for dryness / bleeding - add claritin 5/2 - monitor fever curve / leukocytosis - Dysphagia 3 diet, honey thick  liquids   Canary Brim, NP-C [facility] Pgr: [phone] or [phone]   Levy Pupa, MD, PhD 10/21/2012, 12:47 PM [facility] [phone] or if no answer [phone]

## 2012-10-21 NOTE — Progress Notes (Signed)
Chart review complete.  Patient is not eligible for [facility] Care Management services because his/her PCP is not a [facility] primary care provider or is not [facility] affiliated.  For any additional questions or new referrals please contact Tim Henderson BSN RN [facility] Liaison at [phone] °

## 2012-10-21 NOTE — Progress Notes (Signed)
[facility] PROGRESS NOTE  [name] [name] ZOX:[id number] DOB: 1941/05/01 DOA: 10/15/2012 PCP: Kaleen Mask, MD  Assessment/Plan: Acute and chronic respiratory failure due to COPD exacerbation and suspected chronic aspiration  -was more lethargic on 4/30 ? 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  Acute on chronic combined systolic and diastolic congestive heart failure/NYHA Class 2/DHF Grade 2  -EF has decreased slightly to 30-35% but could also be interpreter variant  -new finding of grade 2 DHF  -appreciate [facility] eval  -euvolemic 4/29 so Cards dc'd lasix - 4/30 appeared dry but Scr slow trend down  -Cards fels is still somewhat hypervolemic and now with hyponatremia, elevated BNP so they have resumed Lasix- they have ordered labs to calculate FeNa (suspect SIADH)   Bleeding  - RESOLVED  -platelets are normal   Deconditioning  -PT/OT evaluation   PVD, Lt Ax-fem and fem-fem [facility] 2/12, Dr Edilia Bo follows  - asymptomatic at this time   Mild acute delirium  -likely due to steroids and hypo Ca+ - follow-RESOLVED   HTN (hypertension)  - stable   Hypocalcemia  - corrected calcium is 7.7 (4/30) and 7.5 (5/1)  - cont to replace with oral calcium TID  - repleted magnesium - phosphorus normal-follow   Hyponatremia/hypochloremia/metabolic alkalosis  - likely due to fluid over load and severe hypocalcemia-better   Thyroid cancer  - s/p total thyroidectomy with left modified radical neck dissection  - Patient to continue routine follow up with surgeon  CAD  (coronary artery disease)  - cont ASA  H/O Tobacco abuse   Code Status: FULL  Family Communication: discussed w/ pt and daughter at bedside  Disposition Plan: Transfer to telemetry  Consultants:  Pulmonology: Dr. Delton Coombes  Cardiology Dr. Rennis Golden  Procedures:  2 D echocardiogram  Antibiotics:  Clindamycin and Levaquin  HPI/Subjective: No new complaints.  Cardiology feels patient is getting closed to discharge from his standpoint.  No acute issues reported overnight.  Objective: Filed Vitals:   10/21/12 0449 10/21/12 1012 10/21/12 1112 10/21/12 1513  BP: 177/79 142/71  181/77  Pulse: 64 61  70  Temp: 98 F (36.7 C)   98.1 F (36.7 C)  TempSrc: Oral   Oral  Resp: 20   18  Height:      Weight: 61.6 kg (135 lb 12.9 oz)     SpO2: 98%  93% 95%    Intake/Output Summary (Last 24 hours) at 10/21/12 1715 Last data filed at 10/21/12 1500  Gross per 24 hour  Intake   1063 ml  Output    601 ml  Net    462 ml   Filed Weights   10/20/12 0348 10/20/12 1547 10/21/12 0449  Weight: 63.3 kg (139 lb 8.8 oz) 61.5 kg (135 lb 9.3 oz) 61.6 kg (135 lb 12.9 oz)    Exam:   General:  Pt in NAD, Alert and awake  Cardiovascular: RRR, No MRG  Respiratory: no wheezes, no increased wob on [place]  Abdomen: soft, NT, nD  Musculoskeletal: no cyanosis    Data Reviewed: Basic Metabolic Panel:  Recent Labs Lab 10/17/12 0656 10/17/12  1040 10/18/12 0545 10/19/12 0410 10/20/12 0425 10/21/12 0644  NA 125*  --  125* 130* 131* 132*  K 3.3*  --  3.4* 3.5 3.3* 3.3*  CL 77*  --  77* 83* 86* 87*  CO2 38*  --  39* 36* 40* 40*  GLUCOSE 116*  --  116* 112* 93 95  BUN 23  --  26* 38* 31* 26*  CREATININE 1.34  --  1.31 1.49* 1.37* 1.37*  CALCIUM 6.9*  --  6.7* 7.0* 6.8* 6.3*  MG  --  1.6  --  2.3  --  1.6  PHOS  --  4.3  --  5.0*  --  3.2   Liver Function Tests:  Recent Labs Lab 10/16/12 0707 10/17/12 0656 10/19/12 0410 10/21/12 0644  AST 26 28  --   --   ALT 16 18  --   --   ALKPHOS  83 77  --   --   BILITOT 0.4 0.4  --   --   PROT 7.6 7.8  --   --   ALBUMIN 3.4* 3.5 3.1* 3.0*   No results found for this basename: LIPASE, AMYLASE,  in the last 168 hours No results found for this basename: AMMONIA,  in the last 168 hours CBC:  Recent Labs Lab 10/15/12 1833 10/15/12 2148 10/18/12 0545 10/20/12 0425  WBC 13.9* 12.5* 27.4* 17.2*  NEUTROABS 10.4*  --   --   --   HGB 10.7* 10.8* 11.8* 11.6*  HCT 30.0* 36.1* 34.4* 34.9*  MCV 81.7 95.5 82.1 84.5  PLT 271 267 327 326   Cardiac Enzymes:  Recent Labs Lab 10/15/12 1840 10/16/12 0058 10/16/12 0707 10/16/12 1258  TROPONINI <0.30 <0.30 <0.30 <0.30   BNP (last 3 results)  Recent Labs  08/29/12 1035 10/15/12 1833 10/17/12 1908  PROBNP 7762.0* 13354.0* 23367.0*   CBG: No results found for this basename: GLUCAP,  in the last 168 hours  Recent Results (from the past 240 hour(s))  MRSA PCR SCREENING     Status: None   Collection Time    10/16/12  1:26 AM      Result Value Range Status   MRSA by PCR NEGATIVE  NEGATIVE Final   Comment:            The GeneXpert MRSA Assay (FDA     approved for NASAL specimens     only), is one component of a     comprehensive MRSA colonization     surveillance program. It is not     intended to diagnose MRSA     infection nor to guide or     monitor treatment for     MRSA infections.     Studies: No results found.  Scheduled Meds: . aspirin EC  81 mg Oral Daily  . calcium carbonate  2 tablet Oral TID WC  . clindamycin  300 mg Oral QID  . clopidogrel  75 mg Oral Daily  . furosemide  40 mg Oral Daily  . gabapentin  600 mg Oral BID  . heparin  5,000 Units Subcutaneous Q8H  . isosorbide mononitrate  30 mg Oral Daily  . levofloxacin  750 mg Oral Q48H  . levothyroxine  100 mcg Oral QAC breakfast  . loratadine  10 mg Oral Daily  . metoprolol succinate  25 mg Oral Daily  . pantoprazole  40 mg Oral Q1200  . potassium chloride  10 mEq Oral Daily  . predniSONE  20 mg  Oral Q breakfast  . ranolazine  500 mg Oral BID  . sodium chloride  3 mL Intravenous Q12H  . tiotropium  18 mcg Inhalation Daily   Continuous Infusions:   Principal Problem:   Acute and chronic respiratory failure Active Problems:   Tobacco abuse, pt quit after MI   Acute on chronic combined systolic and diastolic congestive heart failure- EF of 35-40% via echo in 04/2012   COPD, moderate   PVD, Lt Ax-fem and fem-fem BPG 2/12, Dr Manson Passey follows   HTN (hypertension)   Dyslipidemia   Peripheral vascular disease, unspecified   Hypocalcemia   Thyroid cancer   COPD exacerbation   CAD (coronary artery disease)    Time spent: > 35 minutes    Penny Pia  [facility] Pager [phone]. If 7PM-7AM, please contact night-coverage at [email or web address], password [facility] 10/21/2012, 5:15 PM  LOS: 6 days

## 2012-10-22 DIAGNOSIS — I2510 Atherosclerotic heart disease of native coronary artery without angina pectoris: Secondary | ICD-10-CM

## 2012-10-22 DIAGNOSIS — F17200 Nicotine dependence, unspecified, uncomplicated: Secondary | ICD-10-CM

## 2012-10-22 LAB — CBC WITH DIFFERENTIAL/PLATELET
Basophils Absolute: 0 10*3/uL (ref 0.0–0.1)
Eosinophils Relative: 1 % (ref 0–5)
HCT: 33.6 % — ABNORMAL LOW (ref 39.0–52.0)
Lymphocytes Relative: 5 % — ABNORMAL LOW (ref 12–46)
Lymphs Abs: 0.9 10*3/uL (ref 0.7–4.0)
MCV: 85.1 fL (ref 78.0–100.0)
Monocytes Absolute: 1.4 10*3/uL — ABNORMAL HIGH (ref 0.1–1.0)
Neutro Abs: 16.1 10*3/uL — ABNORMAL HIGH (ref 1.7–7.7)
Platelets: 319 10*3/uL (ref 150–400)
RBC: 3.95 MIL/uL — ABNORMAL LOW (ref 4.22–5.81)
RDW: 15.1 % (ref 11.5–15.5)
WBC: 18.6 10*3/uL — ABNORMAL HIGH (ref 4.0–10.5)

## 2012-10-22 MED ORDER — TIOTROPIUM BROMIDE MONOHYDRATE 18 MCG IN CAPS: 18.0000 ug | ORAL_CAPSULE | Freq: Every day | RESPIRATORY_TRACT | Status: AC

## 2012-10-22 MED ORDER — FUROSEMIDE 40 MG PO TABS: 40.0000 mg | ORAL_TABLET | Freq: Every day | ORAL | Status: DC

## 2012-10-22 MED ORDER — PREDNISONE 20 MG PO TABS: 20.0000 mg | ORAL_TABLET | Freq: Every day | ORAL | Status: DC

## 2012-10-22 MED ORDER — RANOLAZINE ER 500 MG PO TB12: 500.0000 mg | ORAL_TABLET | Freq: Two times a day (BID) | ORAL | Status: DC

## 2012-10-22 NOTE — Discharge Summary (Addendum)
Physician Discharge Summary  [name] [name] ZOX:[id number] DOB: 1940/12/15 DOA: 10/15/2012  PCP: Kaleen Mask, MD  Admit date: 10/15/2012 Discharge date: 10/22/2012  Time spent: > 35 minutes  Recommendations for Outpatient Follow-up:  1. Please follow up with WBC levels  Discharge Diagnoses:  Principal Problem:   Acute and chronic respiratory failure Active Problems:   Tobacco abuse, pt quit after MI   Acute on chronic combined systolic and diastolic congestive heart failure- EF of 35-40% via echo in 04/2012   COPD, moderate   PVD, Lt Ax-fem and fem-fem BPG 2/12, Dr Manson Passey follows   HTN (hypertension)   Dyslipidemia   Peripheral vascular disease, unspecified   Hypocalcemia   Thyroid cancer   COPD exacerbation   CAD (coronary artery disease)   Discharge Condition: stable  Diet recommendation: heart healthy  Filed Weights   10/20/12 1547 10/21/12 0449 10/22/12 0518  Weight: 61.5 kg (135 lb 9.3 oz) 61.6 kg (135 lb 12.9 oz) 62.687 kg (138 lb 3.2 oz)    History of present illness:  72 y/o with h/o CHF (mixed), CAD s/p MI while in ICU, COPD, thyroid cancer s/p thyroid surgery who presented to the ED complaining of cough and SOB  Hospital Course:  Acute and chronic respiratory failure due to COPD exacerbation and suspected chronic aspiration  -cont steroid taper slowly over 2 weeks at recommendation of pulmonary medicine  -cont supportive care/nebs  -Dr Delton Coombes added back Spiriva (4/29)  -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 - Patient completed 7 days of levaquin as such will not discharge on more levaquin - He can continue the clindamycin for his skin infection. - Patient afebrile with no complaints of cough and reports breathing at baseline.  Acute on  chronic combined systolic and diastolic congestive heart failure/NYHA Class 2/DHF Grade 2  -EF has decreased slightly to 30-35% but could also be interpreter variant  -new finding of grade 2 DHF  -appreciate [facility] eval  -euvolemic 4/29 so Cards dc'd lasix - 4/30 appeared dry but Scr slow trend down  -Cards fels is still somewhat hypervolemic and now with hyponatremia, elevated BNP so they have resumed Lasix- they have ordered labs to calculate FeNa (suspect SIADH)  Addendum: ACEI/ARB not prescribed due to renal insufficiency   Bleeding  - RESOLVED  -platelets are normal   Deconditioning  -PT/OT evaluation   PVD, Lt Ax-fem and fem-fem BPG 2/12, Dr Edilia Bo follows  - asymptomatic at this time   Mild acute delirium  -likely due to steroids and hypo Ca+ - follow-RESOLVED   HTN (hypertension)  - stable   Hypocalcemia  - corrected calcium is 7.7 (4/30) and 7.5 (5/1)  - cont to replace with oral calcium TID  - repleted magnesium - phosphorus normal-follow   Hyponatremia/hypochloremia/metabolic alkalosis  - likely due to fluid over load and severe hypocalcemia-better   Thyroid cancer  - s/p total thyroidectomy with left modified radical neck dissection  - Patient to continue routine follow up with surgeon   CAD (coronary artery disease)  - cont ASA   H/O Tobacco abuse    Procedures:  2 D echocardiogram  Consultations:  Pulmonary: Dr. Delton Coombes  Cardiology: Dr. Herbie Baltimore  Discharge Exam: Filed Vitals:   10/21/12 1513 10/21/12 2007 10/22/12 0518 10/22/12 0909  BP: 181/77 138/84 153/68  Pulse: 70 77 67   Temp: 98.1 F (36.7 C) 97.9 F (36.6 C) 97.9 F (36.6 C)   TempSrc: Oral Oral Oral   Resp: 18 18 18    Height:      Weight:   62.687 kg (138 lb 3.2 oz)   SpO2: 95% 96% 92% 93%    General: Pt in NAD, Alert and Awake Cardiovascular: RRR, no rubs Respiratory: CTA BL, no wheezes, prolonged expiratory phase  Discharge Instructions  Discharge Orders   Future  Appointments Provider Department Dept Phone   05/03/2013 11:00 AM [facility] [phone]   05/03/2013 11:40 AM Evern Bio, NP [facility] [phone]   Future Orders Complete By Expires     Call MD for:  difficulty breathing, headache or visual disturbances  As directed     Call MD for:  temperature >100.4  As directed     Diet - low sodium heart healthy  As directed     Discharge instructions  As directed     Comments:      Please have your WBC levels rechecked next week by your PCP.  Also be sure to follow up with your pulmonologist.    Increase activity slowly  As directed         Medication List    STOP taking these medications       losartan 25 MG tablet  Commonly known as:  COZAAR      TAKE these medications       aspirin EC 81 MG tablet  Take 81 mg by mouth daily.     calcium carbonate 1250 MG tablet  Commonly known as:  OS-CAL - dosed in mg of elemental calcium  Take 1 tablet (500 mg of elemental calcium total) by mouth 4 (four) times daily.     clindamycin 300 MG capsule  Commonly known as:  CLEOCIN  Take 300 mg by mouth 4 (four) times daily.     clopidogrel 75 MG tablet  Commonly known as:  PLAVIX  Take 75 mg by mouth daily.     furosemide 40 MG tablet  Commonly known as:  LASIX  Take 1 tablet (40 mg total) by mouth daily. May take 1 additional dose if needed for swelling     gabapentin 600 MG tablet  Commonly known as:  NEURONTIN  Take 600 mg by mouth 2 (two) times daily.     isosorbide mononitrate 30 MG 24 hr tablet  Commonly known as:  IMDUR  Take 1 tablet (30 mg total) by mouth daily.     levothyroxine 100 MCG tablet  Commonly known as:  SYNTHROID  Take 1 tablet (100 mcg total) by mouth daily before breakfast.     metoprolol succinate 25 MG 24 hr tablet  Commonly known as:  TOPROL-XL  Take 25 mg by mouth daily.     nitroGLYCERIN 0.4 MG SL tablet  Commonly known  as:  NITROSTAT  Place 1 tablet (0.4 mg total) under the tongue every 5 (five) minutes x 3 doses as needed for chest pain.     oxyCODONE-acetaminophen 5-325 MG per tablet  Commonly known as:  PERCOCET/ROXICET  Take 1-2 tablets by mouth every 4 (four) hours as needed for pain.     pantoprazole 40 MG tablet  Commonly known as:  PROTONIX  Take 1 tablet (40 mg total) by mouth daily at 12 noon.     potassium chloride 10 MEQ tablet  Commonly known as:  K-DUR,KLOR-CON  Take 10 mEq by mouth daily.     predniSONE 20 MG tablet  Commonly known as:  DELTASONE  Take 1 tablet (20 mg total) by mouth daily with breakfast. Take 1 tablet by mouth for the next week. Then take 1/2 tab (10mg ) for the next week.  Then discontinue     ranolazine 500 MG 12 hr tablet  Commonly known as:  RANEXA  Take 1 tablet (500 mg total) by mouth 2 (two) times daily.     tiotropium 18 MCG inhalation capsule  Commonly known as:  SPIRIVA  Place 1 capsule (18 mcg total) into inhaler and inhale daily.       Allergies  Allergen Reactions  . Ceclor (Cefaclor) Anaphylaxis    Recently took pcn for tooth problems, without issues  . Morphine And Related Other (See Comments)    Abnormal behavior      The results of significant diagnostics from this hospitalization (including imaging, microbiology, ancillary and laboratory) are listed below for reference.    Significant Diagnostic Studies: Dg Chest 2 View  10/15/2012  *RADIOLOGY REPORT*  Clinical Data: Shortness of breath, productive cough  CHEST - 2 VIEW  Comparison: 10/04/2012  Findings: Left apical clips again noted.  The patient is rotated to the right.  Right axillary presumed vascular calcifications noted. Moderate enlargement cardiomediastinal silhouette is noted.  Trace bilateral pleural effusions are increased.  A few new peripheral Kerley B lines are identified.  Thoracic spine compression deformities are again noted.  IMPRESSION: Cardiomegaly with increased  pleural effusions and interstitial Kerley B lines which may indicate developing interstitial pulmonary edema.   Original Report Authenticated By: Christiana Pellant, M.D.    Dg Chest 2 View  10/04/2012  *RADIOLOGY REPORT*  Clinical Data: Preoperative assessment for thyroidectomy, history cough, hypertension, COPD, CHF  CHEST - 2 VIEW  Comparison: 08/29/2012  Findings: Borderline enlargement of cardiac silhouette. Atherosclerotic calcification aorta. Mediastinal contours and pulmonary vascularity normal. Emphysematous and bronchitic changes consistent with COPD. Biapical scarring and minimal chronic interstitial prominence stable. Mild right basilar scarring and minimal elevation of left diaphragm noted. No definite infiltrate, pleural effusion, or pneumothorax. Old appearing marked compression deformities of to upper and mid thoracic vertebrae appear grossly stable since previous exams.  IMPRESSION: Borderline enlargement of cardiac silhouette. COPD with stable scarring. No acute abnormalities.   Original Report Authenticated By: Ulyses Southward, M.D.    US Soft Tissue Head/neck  09/22/2012  *RADIOLOGY REPORT*  Clinical Data: Evaluate thyroid nodules  THYROID ULTRASOUND  Technique: Ultrasound examination of the thyroid gland and adjacent soft tissues was performed.  Comparison:  Neck CT - 09/16/2012; 06/11/2011  Findings:  There is mild diffuse heterogeneity of thyroid parenchymal echotexture.  Right thyroid lobe:  5.9 x 2.3 x 1.9 cm Left thyroid lobe:  5.7 x 3.3 x 1.5 cm Isthmus:  0.7 cm in diameter  Focal nodules:  .  Isthmus -1.0 x 0.8 x 0.8 cm, hypoechoic, solid  Right, mid - 0.6 cm, hypoechoic with eccentric foci of increased echogenicity, solid.  Right, inferior - 0.4 cm, hypoechoic, solid  Left, inferior, posterior- 2.0 x 1.8 x 1.7 cm - hypoechoic, exophytic.  This structure which correlates on the preceding neck CT either represents an exophytic nodule versus an adjacent enlarged cervical lymph node.  Left, mid  - 0.5 cm, hypoechoic, solid  Lymphadenopathy:  There is an approximately 1.8 cm mixed echogenic left-sided supraclavicular lymph node (image 93) which correlates with the dominant lymph node  seen on preceding neck CT.  Shoddy right-sided cervical and submandibular lymph nodes are not enlarged by ultrasound criteria and maintained benign fatty hila.  IMPRESSION: Dominant approximately 2 cm exophytic ill-defined hypoechoic structure adjacent to the posterior-inferior aspect of the left lobe of the thyroid may represent either represents an exophytic thyroid nodule versus an enlarged adjacent cervical lymph node.  In the setting of nonspecific though pathologically enlarged left- sided supraclavicular adenopathy, ultrasound-guided biopsy of a more superficial enlarged cervical lymph node is recommended.   Original Report Authenticated By: Tacey Ruiz, MD    US Biopsy  10/17/2012  *RADIOLOGY REPORT*  Clinical data:  Supraclavicular adenopathy  ULTRASOUND-GUIDED LEFT SUPRACLAVICULAR CORE BIOPSY  Comparison:  09/22/2012  Technique and findings: The procedure, risks (including but not limited to bleeding, infection, organ damage), benefits, and alternatives were explained to the patient.  Questions regarding the procedure were encouraged and answered.  The patient understands and consents to the procedure.Survey ultrasound of the left supraclavicular region was performed.  The adenopathy was localized and an appropriate skin entry site was identified. Operator donned sterile gloves and mask.   Site was marked, prepped with Betadine, draped in usual sterile fashion, infiltrated locally with 1% lidocaine.  Intravenous Fentanyl and Versed were administered as conscious sedation during continuous cardiorespiratory monitoring by the radiology RN, with a total moderate sedation time of 10 minutes.  Under real time ultrasound guidance, a 17 gauge trocar needle was advanced to the margin of the lesion.  Once needle tip  position was confirmed, coaxial 18-gauge core biopsy samples were obtained, submitted in saline to  surgical pathology.  The guide needle was removed.  Postprocedure scanning shows no hematoma or other apparent complication.  The patient tolerated the procedure well.  No immediate complication.  IMPRESSION: Technically successful ultrasound-guided core biopsy of left supraclavicular adenopathy.   Original Report Authenticated By: D. Andria Rhein, MD    Dg Chest Port 1 View  10/21/2012  *RADIOLOGY REPORT*  Clinical Data: Suspected aspiration event, question infiltrate  PORTABLE CHEST - 1 VIEW  Comparison: Portable exam 1052 hours compared to 10/15/2012  Findings: Enlargement of cardiac silhouette. Densely calcified thoracic aorta. Pulmonary vascularity normal. Bibasilar infiltrates question pneumonia versus aspiration. Upper lungs clear. Bones diffusely demineralized. No gross pleural effusion or pneumothorax.  IMPRESSION: Emphysematous changes with bibasilar infiltrates question pneumonia versus aspiration.   Original Report Authenticated By: Ulyses Southward, M.D.    Dg Swallowing Func-speech Pathology  10/17/2012  Breck Coons [place], CCC-SLP     10/17/2012 10:46 AM Objective Swallowing Evaluation: Modified Barium Swallowing Study   Patient Details  Name: RETT STEHLIK MRN: [id number] Date of Birth: 1941-06-21  Today's Date: 10/17/2012 Time: 0950-1018 SLP Time Calculation (min): 28 min  Past Medical History:  Past Medical History  Diagnosis Date  . Hypertension   . Hernia   . Hyperlipidemia   . Peripheral vascular disease   . Shoulder fracture, left   . At risk for sudden cardiac death, due to [facility] April 01, 2012  . CHF (congestive heart failure)   . Cancer     skin cancer  . Shortness of breath   . Stroke 2004    minor  . Dysrhythmia     PAF  . COPD (chronic obstructive pulmonary disease)     PT DENIES USES O2 2 LITERS HS  . Heart murmur   . Hepatitis     AT AGE  64; unknown type    . Heart attack     03/17/12; subtotally  occluded RCI with unsucessful PCI  . Coronary artery disease     Cardiologist is Dr. Nanetta Batty    Past Surgical History:  Past Surgical History  Procedure Laterality Date  . Bypass graft  07/2010    axillobifemoral BPG  . Spine surgery  11/23/06    microdiscectomy L5-S1  . Hemorrhoid surgery    . Tonsillectomy    . Hernia repair  02/09/11    Ventral  . Cardiac catheterization      2013  . Coronary angioplasty      2013  . Thyroidectomy Bilateral 10/07/2012    Procedure: THYROIDECTOMY;  Surgeon: Darletta Moll, MD;  Location:  [facility] OR;  Service: ENT;  Laterality: Bilateral;  . Radical neck dissection Left 10/07/2012    Procedure: RADICAL NECK DISSECTION;  Surgeon: Darletta Moll, MD;   Location: [facility] OR;  Service: ENT;  Laterality: Left;   HPI:  72 y.o. 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 Pt noted to have had extensive  thyroidectomy approx 1 week ago with rather extensive involvement  into L jugular and internal carotid.  CXR Cardiomegaly with  increased pleural effusions and interstitial Kerley B lines which  may indicate developing interstitial pulmonary edema.  MBS  recommended.     Assessment / Plan / Recommendation Clinical Impression  Dysphagia Diagnosis: Moderate pharyngeal phase dysphagia;Mild  cervical esophageal phase dysphagia Clinical impression: Pt. exhibits a moderate pharyngeal phase  dysphagia and mild cervical esophageal dysphagia.  Pharyngeal  impairments are impacted by motor dysfunction leading to  decreased laryngeal closure and silent aspiration with thin and  nectar consistencies.  Chin tuck posture did not facilitate  swallow and resulted in continued penetration/aspiration.   Vallecular and pyriform sinus residue was min-mild overall and  mild-moderate vallecular  residue with honey thick.  A verbal cue  for second swallow cleared majority of residue.  Upper cervical  esophageal sphincter did not allow complete passage of bolus into  esophagus with intermittent pyriform sinus.  Prognosis/timeframe  for return to thin liquids not known with possible vagus nerve  involvement post surgery (per post-op note).  Recommend honey  thick liquids (pt. has verbalized a strong dislike for thick  liquids and may refuse honey thick) and Dys 3 diet (energy  conservation).  SLP will follow for toleration of diet and  further education.      Treatment Recommendation  Therapy as outlined in treatment plan below    Diet Recommendation Dysphagia 3 (Mechanical Soft);Honey-thick  liquid   Liquid Administration via: Cup Medication Administration: Whole meds with puree Supervision: Patient able to self feed;Intermittent supervision  to cue for compensatory strategies Compensations: Slow rate;Small sips/bites;Multiple dry swallows  after each bite/sip Postural Changes and/or Swallow Maneuvers: Seated upright 90  degrees    Other  Recommendations Recommended Consults: MBS Oral Care Recommendations: Oral care BID   Follow Up Recommendations  Home health SLP    Frequency and Duration min 2x/week  2 weeks   Pertinent Vitals/Pain     SLP Swallow Goals Patient will utilize recommended strategies during swallow to  increase swallowing safety with: Minimal cueing Goal #3: Pt. will perform pharyngeal exercises to strengthen  musculature with min verbal cues.  Reason for Referral Objectively evaluate swallowing function   Oral Phase Oral Preparation/Oral Phase Oral Phase: WFL   Pharyngeal Phase Pharyngeal Phase Pharyngeal Phase: Impaired Pharyngeal - Honey Pharyngeal - Honey Teaspoon: Pharyngeal residue -  valleculae;Pharyngeal residue - pyriform sinuses;Reduced  laryngeal elevation;Reduced tongue base retraction Pharyngeal - Honey Cup: Pharyngeal residue -  valleculae;Pharyngeal residue - pyriform  sinuses;Reduced  laryngeal elevation;Reduced tongue base retraction Pharyngeal - Nectar Pharyngeal - Nectar Teaspoon: Reduced laryngeal elevation;Reduced  anterior laryngeal mobility;Reduced airway/laryngeal  closure;Penetration/Aspiration during swallow Penetration/Aspiration details (nectar teaspoon): Material enters  airway, CONTACTS cords and not ejected out;Material enters  airway, remains ABOVE vocal cords then ejected out;Material  enters airway, remains ABOVE vocal cords and not ejected out Pharyngeal - Nectar Cup: Pharyngeal residue -  valleculae;Pharyngeal residue - pyriform sinuses;Reduced  airway/laryngeal closure;Reduced laryngeal elevation;Reduced  anterior laryngeal mobility;Penetration/Aspiration during  swallow;Trace aspiration Penetration/Aspiration details (nectar cup): Material enters  airway, passes BELOW cords without attempt by patient to eject  out (silent aspiration);Material enters airway, remains ABOVE  vocal cords and not ejected out;Material enters airway, remains  ABOVE vocal cords then ejected out Pharyngeal - Thin Pharyngeal - Thin Teaspoon: Not tested Pharyngeal - Thin Cup: Penetration/Aspiration during  swallow;Reduced laryngeal elevation;Reduced anterior laryngeal  mobility;Reduced airway/laryngeal closure;Trace  aspiration;Pharyngeal residue - valleculae;Pharyngeal residue -  pyriform sinuses (trace-min residue) Penetration/Aspiration details (thin cup): Material enters  airway, passes BELOW cords without attempt by patient to eject  out (silent aspiration);Material enters airway, remains ABOVE  vocal cords and not ejected out;Material enters airway, remains  ABOVE vocal cords then ejected out Pharyngeal - Solids Pharyngeal - Regular: Pharyngeal residue - valleculae;Reduced  tongue base retraction  Cervical Esophageal Phase    GO    Cervical Esophageal Phase Cervical Esophageal Phase: Impaired (intermittent backflow into  pyriform sinuses)         Darrow Bussing.Ed CCC-SLP  Pager [phone]  10/17/2012     Microbiology: Recent Results (from the past 240 hour(s))  MRSA PCR SCREENING     Status: None   Collection Time    10/16/12  1:26 AM      Result Value Range Status   MRSA by PCR NEGATIVE  NEGATIVE Final   Comment:            The GeneXpert MRSA Assay (FDA     approved for NASAL specimens     only), is one component of a     comprehensive MRSA colonization     surveillance program. It is not     intended to diagnose MRSA     infection nor to guide or     monitor treatment for     MRSA infections.     Labs: Basic Metabolic Panel:  Recent Labs Lab 10/17/12 0656 10/17/12 1040 10/18/12 0545 10/19/12 0410 10/20/12 0425 10/21/12 0644  NA 125*  --  125* 130* 131* 132*  K 3.3*  --  3.4* 3.5 3.3* 3.3*  CL 77*  --  77* 83* 86* 87*  CO2 38*  --  39* 36* 40* 40*  GLUCOSE 116*  --  116* 112* 93 95  BUN 23  --  26* 38* 31* 26*  CREATININE 1.34  --  1.31 1.49* 1.37* 1.37*  CALCIUM 6.9*  --  6.7* 7.0* 6.8* 6.3*  MG  --  1.6  --  2.3  --  1.6  PHOS  --  4.3  --  5.0*  --  3.2   Liver Function Tests:  Recent Labs Lab  10/16/12 0707 10/17/12 0656 10/19/12 0410 10/21/12 0644  AST 26 28  --   --   ALT 16 18  --   --   ALKPHOS 83 77  --   --   BILITOT 0.4 0.4  --   --   PROT 7.6 7.8  --   --   ALBUMIN 3.4* 3.5 3.1* 3.0*   No results found for this basename: LIPASE, AMYLASE,  in the last 168 hours No results found for this basename: AMMONIA,  in the last 168 hours CBC:  Recent Labs Lab 10/15/12 1833 10/15/12 2148 10/18/12 0545 10/20/12 0425 10/22/12 0933  WBC 13.9* 12.5* 27.4* 17.2* 18.6*  NEUTROABS 10.4*  --   --   --  16.1*  HGB 10.7* 10.8* 11.8* 11.6* 11.5*  HCT 30.0* 36.1* 34.4* 34.9* 33.6*  MCV 81.7 95.5 82.1 84.5 85.1  PLT 271 267 327 326 319   Cardiac Enzymes:  Recent Labs Lab 10/15/12 1840 10/16/12 0058 10/16/12 0707 10/16/12 1258  TROPONINI <0.30 <0.30 <0.30 <0.30   BNP: BNP (last 3 results)  Recent Labs   08/29/12 1035 10/15/12 1833 10/17/12 1908  PROBNP 7762.0* 13354.0* 23367.0*   CBG: No results found for this basename: GLUCAP,  in the last 168 hours     Signed:  Penny Pia  [facility] 10/22/2012, 1:06 PM

## 2012-10-23 NOTE — Progress Notes (Signed)
10/23/12 1127 Noted new CM referral for medication needs.  Pt. has insurance, therefore he is unable to qualify for medication assistance thru [facility].  Tera Mater, RN, BSN NCM [phone]

## 2012-11-15 ENCOUNTER — Other Ambulatory Visit (HOSPITAL_COMMUNITY): Payer: Self-pay | Admitting: Endocrinology

## 2012-11-15 DIAGNOSIS — C73 Malignant neoplasm of thyroid gland: Secondary | ICD-10-CM

## 2012-11-28 ENCOUNTER — Encounter (HOSPITAL_COMMUNITY)
Admission: RE | Admit: 2012-11-28 | Discharge: 2012-11-28 | Disposition: A | Discharge status: Home / Self Care | Payer: Medicare Other | Source: Ambulatory Visit | Attending: Endocrinology | Admitting: Endocrinology

## 2012-11-28 DIAGNOSIS — C73 Malignant neoplasm of thyroid gland: Secondary | ICD-10-CM | POA: Insufficient documentation

## 2012-11-28 MED ORDER — THYROTROPIN ALFA 1.1 MG IM SOLR
0.9000 mg | INTRAMUSCULAR | Status: DC
  Filled 2012-11-28: qty 0.9

## 2012-11-29 ENCOUNTER — Encounter (HOSPITAL_COMMUNITY)
Admission: RE | Admit: 2012-11-29 | Discharge: 2012-11-29 | Disposition: A | Discharge status: Home / Self Care | Payer: Medicare Other | Source: Ambulatory Visit | Attending: Endocrinology | Admitting: Endocrinology

## 2012-11-29 DIAGNOSIS — I059 Rheumatic mitral valve disease, unspecified: Secondary | ICD-10-CM | POA: Diagnosis present

## 2012-11-29 DIAGNOSIS — J441 Chronic obstructive pulmonary disease with (acute) exacerbation: Secondary | ICD-10-CM | POA: Diagnosis present

## 2012-11-29 DIAGNOSIS — E873 Alkalosis: Secondary | ICD-10-CM | POA: Diagnosis present

## 2012-11-29 DIAGNOSIS — D649 Anemia, unspecified: Secondary | ICD-10-CM | POA: Diagnosis present

## 2012-11-29 DIAGNOSIS — I2510 Atherosclerotic heart disease of native coronary artery without angina pectoris: Secondary | ICD-10-CM | POA: Diagnosis present

## 2012-11-29 DIAGNOSIS — E876 Hypokalemia: Secondary | ICD-10-CM | POA: Diagnosis not present

## 2012-11-29 DIAGNOSIS — J189 Pneumonia, unspecified organism: Secondary | ICD-10-CM | POA: Diagnosis not present

## 2012-11-29 DIAGNOSIS — I2589 Other forms of chronic ischemic heart disease: Secondary | ICD-10-CM | POA: Diagnosis present

## 2012-11-29 DIAGNOSIS — Z9981 Dependence on supplemental oxygen: Secondary | ICD-10-CM

## 2012-11-29 DIAGNOSIS — E878 Other disorders of electrolyte and fluid balance, not elsewhere classified: Secondary | ICD-10-CM | POA: Diagnosis present

## 2012-11-29 DIAGNOSIS — Z79899 Other long term (current) drug therapy: Secondary | ICD-10-CM

## 2012-11-29 DIAGNOSIS — R1310 Dysphagia, unspecified: Secondary | ICD-10-CM | POA: Diagnosis not present

## 2012-11-29 DIAGNOSIS — Z9861 Coronary angioplasty status: Secondary | ICD-10-CM

## 2012-11-29 DIAGNOSIS — Z85828 Personal history of other malignant neoplasm of skin: Secondary | ICD-10-CM

## 2012-11-29 DIAGNOSIS — I509 Heart failure, unspecified: Secondary | ICD-10-CM | POA: Diagnosis present

## 2012-11-29 DIAGNOSIS — Z7982 Long term (current) use of aspirin: Secondary | ICD-10-CM

## 2012-11-29 DIAGNOSIS — I739 Peripheral vascular disease, unspecified: Secondary | ICD-10-CM | POA: Diagnosis present

## 2012-11-29 DIAGNOSIS — I5043 Acute on chronic combined systolic (congestive) and diastolic (congestive) heart failure: Principal | ICD-10-CM | POA: Diagnosis present

## 2012-11-29 DIAGNOSIS — I252 Old myocardial infarction: Secondary | ICD-10-CM

## 2012-11-29 DIAGNOSIS — J9620 Acute and chronic respiratory failure, unspecified whether with hypoxia or hypercapnia: Secondary | ICD-10-CM | POA: Diagnosis present

## 2012-11-29 DIAGNOSIS — E890 Postprocedural hypothyroidism: Secondary | ICD-10-CM | POA: Diagnosis present

## 2012-11-29 DIAGNOSIS — E785 Hyperlipidemia, unspecified: Secondary | ICD-10-CM | POA: Diagnosis present

## 2012-11-29 DIAGNOSIS — C73 Malignant neoplasm of thyroid gland: Secondary | ICD-10-CM | POA: Diagnosis present

## 2012-11-29 DIAGNOSIS — J690 Pneumonitis due to inhalation of food and vomit: Secondary | ICD-10-CM | POA: Diagnosis not present

## 2012-11-29 DIAGNOSIS — I129 Hypertensive chronic kidney disease with stage 1 through stage 4 chronic kidney disease, or unspecified chronic kidney disease: Secondary | ICD-10-CM | POA: Diagnosis present

## 2012-11-29 DIAGNOSIS — Z8673 Personal history of transient ischemic attack (TIA), and cerebral infarction without residual deficits: Secondary | ICD-10-CM

## 2012-11-29 DIAGNOSIS — Z87891 Personal history of nicotine dependence: Secondary | ICD-10-CM

## 2012-11-29 DIAGNOSIS — N183 Chronic kidney disease, stage 3 unspecified: Secondary | ICD-10-CM | POA: Diagnosis present

## 2012-11-29 DIAGNOSIS — G9340 Encephalopathy, unspecified: Secondary | ICD-10-CM | POA: Diagnosis present

## 2012-11-29 DIAGNOSIS — D72829 Elevated white blood cell count, unspecified: Secondary | ICD-10-CM | POA: Diagnosis present

## 2012-11-29 DIAGNOSIS — Z7902 Long term (current) use of antithrombotics/antiplatelets: Secondary | ICD-10-CM

## 2012-11-29 DIAGNOSIS — E871 Hypo-osmolality and hyponatremia: Secondary | ICD-10-CM | POA: Diagnosis present

## 2012-11-29 MED ORDER — THYROTROPIN ALFA 1.1 MG IM SOLR
0.9000 mg | INTRAMUSCULAR | Status: AC
  Administered 2012-11-29: 0.9 mg via INTRAMUSCULAR
  Filled 2012-11-29: qty 0.9

## 2012-11-30 ENCOUNTER — Encounter (HOSPITAL_COMMUNITY)
Admission: RE | Admit: 2012-11-30 | Discharge: 2012-11-30 | Disposition: A | Discharge status: Home / Self Care | Payer: Medicare Other | Source: Ambulatory Visit | Attending: Endocrinology | Admitting: Endocrinology

## 2012-12-02 ENCOUNTER — Other Ambulatory Visit: Payer: Self-pay

## 2012-12-02 ENCOUNTER — Emergency Department (HOSPITAL_COMMUNITY): Payer: Medicare Other

## 2012-12-02 ENCOUNTER — Encounter (HOSPITAL_COMMUNITY)
Admission: RE | Admit: 2012-12-02 | Discharge: 2012-12-02 | Disposition: A | Discharge status: Home / Self Care | Payer: Medicare Other | Source: Ambulatory Visit | Attending: Endocrinology | Admitting: Endocrinology

## 2012-12-02 ENCOUNTER — Encounter (HOSPITAL_COMMUNITY): Payer: Self-pay | Admitting: Emergency Medicine

## 2012-12-02 ENCOUNTER — Inpatient Hospital Stay (HOSPITAL_COMMUNITY)
Admission: EM | Admit: 2012-12-02 | Discharge: 2012-12-10 | DRG: 291 | Disposition: A | Discharge status: Home Health | Payer: Medicare Other | Attending: Cardiovascular Disease | Admitting: Cardiovascular Disease

## 2012-12-02 DIAGNOSIS — I480 Paroxysmal atrial fibrillation: Secondary | ICD-10-CM | POA: Diagnosis present

## 2012-12-02 DIAGNOSIS — J9620 Acute and chronic respiratory failure, unspecified whether with hypoxia or hypercapnia: Secondary | ICD-10-CM | POA: Diagnosis present

## 2012-12-02 DIAGNOSIS — J441 Chronic obstructive pulmonary disease with (acute) exacerbation: Secondary | ICD-10-CM | POA: Diagnosis present

## 2012-12-02 DIAGNOSIS — Z720 Tobacco use: Secondary | ICD-10-CM | POA: Diagnosis present

## 2012-12-02 DIAGNOSIS — G9340 Encephalopathy, unspecified: Secondary | ICD-10-CM

## 2012-12-02 DIAGNOSIS — D72829 Elevated white blood cell count, unspecified: Secondary | ICD-10-CM | POA: Diagnosis not present

## 2012-12-02 DIAGNOSIS — I10 Essential (primary) hypertension: Secondary | ICD-10-CM | POA: Diagnosis present

## 2012-12-02 DIAGNOSIS — N183 Chronic kidney disease, stage 3 unspecified: Secondary | ICD-10-CM | POA: Diagnosis present

## 2012-12-02 DIAGNOSIS — J90 Pleural effusion, not elsewhere classified: Secondary | ICD-10-CM

## 2012-12-02 DIAGNOSIS — E039 Hypothyroidism, unspecified: Secondary | ICD-10-CM

## 2012-12-02 DIAGNOSIS — I2510 Atherosclerotic heart disease of native coronary artery without angina pectoris: Secondary | ICD-10-CM | POA: Diagnosis present

## 2012-12-02 DIAGNOSIS — Z8673 Personal history of transient ischemic attack (TIA), and cerebral infarction without residual deficits: Secondary | ICD-10-CM

## 2012-12-02 DIAGNOSIS — R0602 Shortness of breath: Secondary | ICD-10-CM

## 2012-12-02 DIAGNOSIS — C73 Malignant neoplasm of thyroid gland: Secondary | ICD-10-CM | POA: Diagnosis present

## 2012-12-02 DIAGNOSIS — I70219 Atherosclerosis of native arteries of extremities with intermittent claudication, unspecified extremity: Secondary | ICD-10-CM

## 2012-12-02 DIAGNOSIS — J449 Chronic obstructive pulmonary disease, unspecified: Secondary | ICD-10-CM | POA: Diagnosis present

## 2012-12-02 DIAGNOSIS — E785 Hyperlipidemia, unspecified: Secondary | ICD-10-CM | POA: Diagnosis present

## 2012-12-02 DIAGNOSIS — R4182 Altered mental status, unspecified: Secondary | ICD-10-CM | POA: Diagnosis present

## 2012-12-02 DIAGNOSIS — J189 Pneumonia, unspecified organism: Secondary | ICD-10-CM | POA: Diagnosis not present

## 2012-12-02 DIAGNOSIS — E871 Hypo-osmolality and hyponatremia: Secondary | ICD-10-CM | POA: Diagnosis present

## 2012-12-02 DIAGNOSIS — I5043 Acute on chronic combined systolic (congestive) and diastolic (congestive) heart failure: Secondary | ICD-10-CM | POA: Diagnosis present

## 2012-12-02 DIAGNOSIS — I5041 Acute combined systolic (congestive) and diastolic (congestive) heart failure: Secondary | ICD-10-CM

## 2012-12-02 DIAGNOSIS — I255 Ischemic cardiomyopathy: Secondary | ICD-10-CM

## 2012-12-02 DIAGNOSIS — I739 Peripheral vascular disease, unspecified: Secondary | ICD-10-CM | POA: Diagnosis present

## 2012-12-02 DIAGNOSIS — R1310 Dysphagia, unspecified: Secondary | ICD-10-CM | POA: Diagnosis present

## 2012-12-02 HISTORY — DX: Disorder of kidney and ureter, unspecified: N28.9

## 2012-12-02 HISTORY — DX: Malignant neoplasm of thyroid gland: C73

## 2012-12-02 LAB — CBC
MCH: 28.1 pg (ref 26.0–34.0)
Platelets: 315 10*3/uL (ref 150–400)
RBC: 3.74 MIL/uL — ABNORMAL LOW (ref 4.22–5.81)
RDW: 14.6 % (ref 11.5–15.5)

## 2012-12-02 LAB — PRO B NATRIURETIC PEPTIDE: Pro B Natriuretic peptide (BNP): 64029 pg/mL — ABNORMAL HIGH (ref 0–125)

## 2012-12-02 LAB — BASIC METABOLIC PANEL
CO2: 29 mEq/L (ref 19–32)
Calcium: 8.9 mg/dL (ref 8.4–10.5)
GFR calc Af Amer: 54 mL/min — ABNORMAL LOW (ref >90)
GFR calc non Af Amer: 47 mL/min — ABNORMAL LOW (ref >90)
Sodium: 109 mEq/L — CL (ref 135–145)

## 2012-12-02 LAB — POCT I-STAT TROPONIN I: Troponin i, poc: 0.01 ng/mL (ref 0.00–0.08)

## 2012-12-02 MED ORDER — ONDANSETRON HCL 4 MG/2ML IJ SOLN
4.0000 mg | Freq: Four times a day (QID) | INTRAMUSCULAR | Status: DC | PRN
Start: 2012-12-02 — End: 2012-12-10

## 2012-12-02 MED ORDER — SODIUM CHLORIDE 0.9 % IV SOLN
250.0000 mL | INTRAVENOUS | Status: DC | PRN
  Administered 2012-12-03 – 2012-12-05 (×3): 250 mL via INTRAVENOUS

## 2012-12-02 MED ORDER — CLOPIDOGREL BISULFATE 75 MG PO TABS
75.0000 mg | ORAL_TABLET | Freq: Every day | ORAL | Status: DC
  Administered 2012-12-03 – 2012-12-09 (×7): 75 mg via ORAL
  Filled 2012-12-02 (×10): qty 1

## 2012-12-02 MED ORDER — HEPARIN SODIUM (PORCINE) 5000 UNIT/ML IJ SOLN
5000.0000 [IU] | Freq: Three times a day (TID) | INTRAMUSCULAR | Status: DC
  Administered 2012-12-03 – 2012-12-10 (×22): 5000 [IU] via SUBCUTANEOUS
  Filled 2012-12-02 (×25): qty 1

## 2012-12-02 MED ORDER — RANOLAZINE ER 500 MG PO TB12
500.0000 mg | ORAL_TABLET | Freq: Two times a day (BID) | ORAL | Status: DC
  Administered 2012-12-03 – 2012-12-09 (×15): 500 mg via ORAL
  Filled 2012-12-02 (×17): qty 1

## 2012-12-02 MED ORDER — FUROSEMIDE 10 MG/ML IJ SOLN
80.0000 mg | Freq: Once | INTRAMUSCULAR | Status: AC
  Administered 2012-12-02: 80 mg via INTRAVENOUS
  Filled 2012-12-02: qty 8

## 2012-12-02 MED ORDER — POTASSIUM CHLORIDE CRYS ER 10 MEQ PO TBCR
10.0000 meq | EXTENDED_RELEASE_TABLET | Freq: Every day | ORAL | Status: DC
  Administered 2012-12-03: 10 meq via ORAL
  Filled 2012-12-02 (×2): qty 1

## 2012-12-02 MED ORDER — SODIUM IODIDE I 131 CAPSULE
4.0000 | Freq: Once | INTRAVENOUS | Status: AC | PRN
  Administered 2012-12-02: 4 via ORAL

## 2012-12-02 MED ORDER — PANTOPRAZOLE SODIUM 40 MG PO TBEC
40.0000 mg | DELAYED_RELEASE_TABLET | Freq: Every day | ORAL | Status: DC
  Administered 2012-12-03 – 2012-12-09 (×7): 40 mg via ORAL
  Filled 2012-12-02 (×7): qty 1

## 2012-12-02 MED ORDER — GABAPENTIN 600 MG PO TABS
600.0000 mg | ORAL_TABLET | Freq: Two times a day (BID) | ORAL | Status: DC
  Administered 2012-12-03 (×2): 600 mg via ORAL
  Filled 2012-12-02 (×3): qty 1

## 2012-12-02 MED ORDER — ACETAMINOPHEN 325 MG PO TABS
650.0000 mg | ORAL_TABLET | ORAL | Status: DC | PRN
  Administered 2012-12-07 – 2012-12-09 (×5): 650 mg via ORAL
  Filled 2012-12-02 (×5): qty 2
  Filled 2012-12-02: qty 1
  Filled 2012-12-02: qty 2

## 2012-12-02 MED ORDER — ASPIRIN EC 81 MG PO TBEC
81.0000 mg | DELAYED_RELEASE_TABLET | Freq: Every day | ORAL | Status: DC
  Administered 2012-12-03 – 2012-12-09 (×7): 81 mg via ORAL
  Filled 2012-12-02 (×8): qty 1

## 2012-12-02 MED ORDER — FUROSEMIDE 10 MG/ML IJ SOLN
80.0000 mg | Freq: Two times a day (BID) | INTRAMUSCULAR | Status: DC
  Administered 2012-12-03 – 2012-12-04 (×3): 80 mg via INTRAVENOUS
  Filled 2012-12-02 (×5): qty 8

## 2012-12-02 MED ORDER — SODIUM CHLORIDE 0.9 % IJ SOLN
3.0000 mL | INTRAMUSCULAR | Status: DC | PRN
  Administered 2012-12-04: 3 mL via INTRAVENOUS

## 2012-12-02 MED ORDER — CALCIUM CARBONATE 1250 (500 CA) MG PO TABS
1.0000 | ORAL_TABLET | Freq: Three times a day (TID) | ORAL | Status: DC
  Administered 2012-12-03 – 2012-12-09 (×22): 500 mg via ORAL
  Filled 2012-12-02 (×32): qty 1

## 2012-12-02 MED ORDER — TOLVAPTAN 15 MG PO TABS
15.0000 mg | ORAL_TABLET | Freq: Every day | ORAL | Status: DC
  Administered 2012-12-03 (×2): 15 mg via ORAL
  Filled 2012-12-02 (×4): qty 1

## 2012-12-02 MED ORDER — LEVOTHYROXINE SODIUM 100 MCG PO TABS
100.0000 ug | ORAL_TABLET | Freq: Every day | ORAL | Status: DC
  Administered 2012-12-03 – 2012-12-09 (×7): 100 ug via ORAL
  Filled 2012-12-02 (×9): qty 1

## 2012-12-02 MED ORDER — NITROGLYCERIN 0.4 MG SL SUBL: 0.4000 mg | SUBLINGUAL_TABLET | SUBLINGUAL | Status: DC | PRN

## 2012-12-02 MED ORDER — SODIUM CHLORIDE 0.9 % IJ SOLN
3.0000 mL | Freq: Two times a day (BID) | INTRAMUSCULAR | Status: DC
  Administered 2012-12-02 – 2012-12-09 (×15): 3 mL via INTRAVENOUS

## 2012-12-02 MED ORDER — TIOTROPIUM BROMIDE MONOHYDRATE 18 MCG IN CAPS
18.0000 ug | ORAL_CAPSULE | Freq: Every day | RESPIRATORY_TRACT | Status: DC
  Administered 2012-12-04: 18 ug via RESPIRATORY_TRACT
  Filled 2012-12-02: qty 5

## 2012-12-02 MED ORDER — METOPROLOL SUCCINATE ER 25 MG PO TB24
25.0000 mg | ORAL_TABLET | Freq: Every day | ORAL | Status: DC
  Administered 2012-12-03 – 2012-12-09 (×7): 25 mg via ORAL
  Filled 2012-12-02 (×8): qty 1

## 2012-12-02 NOTE — ED Provider Notes (Signed)
History     CSN: [id number]  Arrival date & time 12/02/12  1836   First MD Initiated Contact with Patient 12/02/12 1858      Chief Complaint  Patient presents with  . Shortness of Breath    (Consider location/radiation/quality/duration/timing/severity/associated sxs/prior treatment) HPI  72 year old male with history of hypertension, CHF, COPD, and prior stroke presents complaining of shortness of breath. History was obtained through patient and through daughter who was at bedside. Patient reports for the past week he has gradual onset of increased wounds breath, cough productive with white sputum, decreased appetite, and generalized fatigue. Patient is at home with granddaughter. Patient has been using home O2 for the past week as well. He normally sleeps on one pillow but currently using 2 pillows to sleep for comfort. Having increased shortness of breath when emanating from his bed to the kitchen. Endorse subjective fever without chills. Denies headache, chest pain, hemoptysis, abdominal pain, nausea, vomiting, diarrhea, or rash. Currently taking Lasix. Denies any significant weight gain.  Patient was admitted for CHF exacerbation 2 months ago. Patient has prior history of MI requiring stenting. He also has history of thyroid cancer requiring thyroidectomy. Patient currently taking Plavix.  Past Medical History  Diagnosis Date  . Hypertension   . Hernia   . Hyperlipidemia   . Peripheral vascular disease   . Shoulder fracture, left   . At risk for sudden cardiac death, due to [facility] 04-13-2012  . CHF (congestive heart failure)   . Cancer     skin cancer  . Shortness of breath   . Stroke 2004    minor  . Dysrhythmia     PAF  . COPD (chronic obstructive pulmonary disease)     PT DENIES USES O2 2 LITERS HS  . Heart murmur   . Hepatitis     AT AGE  48; unknown type    . Heart attack     03/17/12; subtotally occluded RCI with unsucessful PCI  . Coronary artery disease      Cardiologist is Dr. Nanetta Batty     Past Surgical History  Procedure Laterality Date  . Bypass graft  07/2010    axillobifemoral BPG  . Spine surgery  11/23/06    microdiscectomy L5-S1  . Hemorrhoid surgery    . Tonsillectomy    . Hernia repair  02/09/11    [facility]  . Cardiac catheterization      2013  . Coronary angioplasty      2013  . Thyroidectomy Bilateral 10/07/2012    Procedure: THYROIDECTOMY;  Surgeon: Darletta Moll, MD;  Location: [facility] OR;  Service: ENT;  Laterality: Bilateral;  . Radical neck dissection Left 10/07/2012    Procedure: RADICAL NECK DISSECTION;  Surgeon: Darletta Moll, MD;  Location: [facility] OR;  Service: ENT;  Laterality: Left;    Family History  Problem Relation Age of Onset  . Alzheimer's disease Mother   . Cancer Father     prostate  . Cancer Brother     prostate  . Stroke Brother     History  Substance Use Topics  . Smoking status: Current Some Day Smoker -- 1.00 packs/day for 50 years    Types: Cigarettes    Last Attempt to Quit: 03/16/2012  . Smokeless tobacco: Never Used  . Alcohol Use: 4.2 oz/week    7 Cans of beer per week     Comment: 2 drinks per day      Review of Systems  All other  systems reviewed and are negative.    Allergies  Ceclor and Morphine and related  Home Medications   Current Outpatient Rx  Name  Route  Sig  Dispense  Refill  . aspirin EC 81 MG tablet   Oral   Take 81 mg by mouth daily.         . calcium carbonate (OS-CAL - DOSED IN MG OF ELEMENTAL CALCIUM) 1250 MG tablet   Oral   Take 1 tablet (500 mg of elemental calcium total) by mouth 4 (four) times daily.   120 tablet   5   . clopidogrel (PLAVIX) 75 MG tablet   Oral   Take 75 mg by mouth daily.         . furosemide (LASIX) 40 MG tablet   Oral   Take 1 tablet (40 mg total) by mouth daily. May take 1 additional dose if needed for swelling   30 tablet   0   . gabapentin (NEURONTIN) 600 MG tablet   Oral   Take 600 mg by mouth 2 (two) times daily.           . isosorbide mononitrate (IMDUR) 30 MG 24 hr tablet   Oral   Take 1 tablet (30 mg total) by mouth daily.   30 tablet   6   . levothyroxine (SYNTHROID) 100 MCG tablet   Oral   Take 1 tablet (100 mcg total) by mouth daily before breakfast.   30 tablet   11   . metoprolol succinate (TOPROL-XL) 25 MG 24 hr tablet   Oral   Take 25 mg by mouth daily.         . nitroGLYCERIN (NITROSTAT) 0.4 MG SL tablet   Sublingual   Place 1 tablet (0.4 mg total) under the tongue every 5 (five) minutes x 3 doses as needed for chest pain.   25 tablet   4   . oxyCODONE-acetaminophen (PERCOCET/ROXICET) 5-325 MG per tablet   Oral   Take 1-2 tablets by mouth every 4 (four) hours as needed for pain.   40 tablet   0   . pantoprazole (PROTONIX) 40 MG tablet   Oral   Take 1 tablet (40 mg total) by mouth daily at 12 noon.   30 tablet   2   . potassium chloride (K-DUR,KLOR-CON) 10 MEQ tablet   Oral   Take 10 mEq by mouth daily.         . predniSONE (DELTASONE) 20 MG tablet   Oral   Take 1 tablet (20 mg total) by mouth daily with breakfast. Take 1 tablet by mouth for the next week. Then take 1/2 tab (10mg ) for the next week.  Then discontinue   20 tablet   0   . ranolazine (RANEXA) 500 MG 12 hr tablet   Oral   Take 1 tablet (500 mg total) by mouth 2 (two) times daily.   30 tablet   0   . tiotropium (SPIRIVA) 18 MCG inhalation capsule   Inhalation   Place 1 capsule (18 mcg total) into inhaler and inhale daily.   30 capsule   0     BP 130/67  Pulse 67  Temp(Src) 97.5 F (36.4 C) (Oral)  Resp 27  Ht 5\' 4"  (1.626 m)  Wt 133 lb (60.328 kg)  BMI 22.82 kg/m2  SpO2 93%  Physical Exam  Nursing note and vitals reviewed. Constitutional: No distress.  Ill-appearing elderly gentleman, appears drowsy but easily arousable.  HENT:  Head: Normocephalic  and atraumatic.  Mouth/Throat: Oropharynx is clear and moist.  Eyes: Conjunctivae are normal.  Neck: Normal range of motion.  Neck supple. JVD present.  Cardiovascular: Normal rate, regular rhythm and intact distal pulses.  Exam reveals no gallop and no friction rub.   No murmur heard. Pulmonary/Chest: Effort normal and breath sounds normal.  decreased breath sounds with rales heard at bases. No sensory muscle use.  Abdominal: Soft. There is no tenderness.  Musculoskeletal: He exhibits no edema.  Neurological: He is alert.  Skin:  Ecchymotic skin changes to both arms, common for someone on blood thinner medication    ED Course  Procedures (including critical care time)   Date: 12/02/2012  Rate: 70  Rhythm: normal sinus rhythm  QRS Axis: left  Intervals: normal  ST/T Wave abnormalities: nonspecific ST/T changes  Conduction Disutrbances:none  Narrative Interpretation:   Old EKG Reviewed: unchanged    7:26 PM Patient with cardiac history and CHF (EF 35-40%), and COPD here for evaluation of shortness of breath. He is in mild respiratory distress, is tachypnea without tachycardic. He is afebrile. He sats at 93% on 2 L of O2. Patient is a drowsy but easily arousable. Workup initiated.  8:14 PM Report given to oncoming PA for further management.  Pt likely benefit from admission for further management.  Care discussed with attending.   8:32 PM Pt with evidence of acute CHF with proBNP 64029, significantly higher than before.  Electrolytes remarkable for a Na+ 109, K+ 5.4, Cl 68.  Creatinine Clearance is 1.45. ECG shows no acute changes, no peaked T-waves. Pt's drowsiness may attributed to low sodium.  Will place Pt on BiPap.  Will also start Lasix 80mg  IV.   Labs Reviewed  CBC - Abnormal; Notable for the following:    WBC 14.0 (*)    RBC 3.74 (*)    Hemoglobin 10.5 (*)    HCT 29.8 (*)    All other components within normal limits  BASIC METABOLIC PANEL - Abnormal; Notable for the following:    Sodium 109 (*)    Potassium 5.4 (*)    Chloride 68 (*)    Creatinine, Ser 1.45 (*)    GFR calc non Af Amer  47 (*)    GFR calc Af Amer 54 (*)    All other components within normal limits  PRO B NATRIURETIC PEPTIDE - Abnormal; Notable for the following:    Pro B Natriuretic peptide (BNP) 64029.0 (*)    All other components within normal limits  POCT I-STAT TROPONIN I   Dg Chest 2 View  12/02/2012   *RADIOLOGY REPORT*  Clinical Data: Worsening shortness breath and cough.  Congestive heart failure.  Prior myocardial infarction. Thyroid carcinoma.  CHEST - 2 VIEW  Comparison: 10/19/2012  Findings: Moderate cardiomegaly again noted.  Increased diffuse interstitial infiltrates are seen, consistent with diffuse interstitial edema.  Small bilateral pleural effusions are seen, left side greater than right, with left basilar atelectasis.  IMPRESSION:  Congestive heart failure, with small pleural effusions and left lower lobe atelectasis.   Original Report Authenticated By: Myles Rosenthal, M.D.   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 These may represent thyroid remnant post thyroidectomy though cervical nodes involved with metastatic thyroid cancer are not  excluded. No definite residual iodine accumulation is seen within the left cervical region. Extensive tracer accumulation is identified stomach and colon. Small amount of excreted tracer within urinary bladder. No definite additional sites of metastatic uptake are identified.  IMPRESSION: Radioiodine uptake at two foci in the right cervical region which may be related to thyroid remnant or lymph nodes. Significant physiologic tracer within the abdomen and stomach, colon and bladder. No distant sites of metastasis are definitely identified.   Original Report Authenticated By: Ulyses Southward, M.D.      1. Shortness of breath       MDM          Fayrene Helper, PA-C 12/02/12 2016  Fayrene Helper, PA-C 12/02/12 2114

## 2012-12-02 NOTE — Progress Notes (Addendum)
Patient placed on Bipap 10/5 40% oxygen. Tolerating well at this time. RT will continue to monitor.

## 2012-12-02 NOTE — ED Notes (Signed)
Pt presents to ED with c/o shortness of breath. PT states he feels like he has pneumonia.

## 2012-12-02 NOTE — H&P (Signed)
CARDIOLOGY ADMISSION NOTE  Patient ID: [name] [name] MRN: [id number] DOB/AGE: 07/18/1940 72 y.o.  Admit date: 12/02/2012 Primary Physician   Kaleen Mask, MD Primary Cardiologist   Dr. Allyson Sabal Chief Complaint    Dyspnea  HPI:    The patient has a history of ischemic cardiomyopathy.  His EF in April was 30-35%. There is mild to moderate mitral regurgitation.  He was admitted at that time with acute on chronic respiratory failure. This was acute on chronic systolic and diastolic heart failure.  He was at that time treated for possible pneumonia.  He was hyponatremic at that time as well.  At that time he was profoundly hypocalcemic.  He did relatively well after discharge.  He is being treated for active thyroid cancer and had extensive surgery apparently with residual disease.  He has been on a very low salt diet. He has not been particularly reducing his fluids. However, his weights have been stable and actually are lower today than they were here last month. However, he's become progressively weaker. Over the last couple of days he's had increased dyspnea now sleeping on 2 pillows. Because of the profound weakness he was brought to the emergency room. CXR demonstrated diffuse pulmonary edema. BNP was 64,029. In the emergency room he was treated with BiPAP and IV Lasix. His sodium was found to be 109.  Calcium was normal. He denies any chest pressure, neck or arm discomfort. He's had no palpitations, presyncope or syncope. He has however had muscle cramping.   Past Medical History  Diagnosis Date  . Hypertension   . Hernia   . Hyperlipidemia   . Peripheral vascular disease   . Shoulder fracture, left   . At risk for sudden cardiac death, due to [facility] 04-22-12  . CHF (congestive heart failure)   . Cancer     skin cancer  . Shortness of breath   . Stroke 2004    minor  . Dysrhythmia     PAF  . COPD (chronic obstructive pulmonary disease)     PT DENIES USES O2 2 LITERS HS  .  Heart murmur   . Hepatitis     AT AGE  38; unknown type    . Heart attack     03/17/12; subtotally occluded RCI with unsucessful PCI  . Coronary artery disease     Cardiologist is Dr. Nanetta Batty     Past Surgical History  Procedure Laterality Date  . Bypass graft  07/2010    axillobifemoral BPG  . Spine surgery  11/23/06    microdiscectomy L5-S1  . Hemorrhoid surgery    . Tonsillectomy    . Hernia repair  02/09/11    [facility]  . Cardiac catheterization      2013  . Coronary angioplasty      2013  . Thyroidectomy Bilateral 10/07/2012    Procedure: THYROIDECTOMY;  Surgeon: Darletta Moll, MD;  Location: [facility] OR;  Service: ENT;  Laterality: Bilateral;  . Radical neck dissection Left 10/07/2012    Procedure: RADICAL NECK DISSECTION;  Surgeon: Darletta Moll, MD;  Location: [facility] OR;  Service: ENT;  Laterality: Left;    Allergies  Allergen Reactions  . Ceclor (Cefaclor) Anaphylaxis    Recently took pcn for tooth problems, without issues  . Morphine And Related Other (See Comments)    Abnormal behavior   No current facility-administered medications on file prior to encounter.   Current Outpatient Prescriptions on File Prior to Encounter  Medication Sig  Dispense Refill  . aspirin EC 81 MG tablet Take 81 mg by mouth daily.      . calcium carbonate (OS-CAL - DOSED IN MG OF ELEMENTAL CALCIUM) 1250 MG tablet Take 1 tablet (500 mg of elemental calcium total) by mouth 4 (four) times daily.  120 tablet  5  . clopidogrel (PLAVIX) 75 MG tablet Take 75 mg by mouth daily.      . furosemide (LASIX) 40 MG tablet Take 1 tablet (40 mg total) by mouth daily. May take 1 additional dose if needed for swelling  30 tablet  0  . gabapentin (NEURONTIN) 600 MG tablet Take 600 mg by mouth 2 (two) times daily.       . isosorbide mononitrate (IMDUR) 30 MG 24 hr tablet Take 1 tablet (30 mg total) by mouth daily.  30 tablet  6  . levothyroxine (SYNTHROID) 100 MCG tablet Take 1 tablet (100 mcg total) by mouth daily before  breakfast.  30 tablet  11  . nitroGLYCERIN (NITROSTAT) 0.4 MG SL tablet Place 1 tablet (0.4 mg total) under the tongue every 5 (five) minutes x 3 doses as needed for chest pain.  25 tablet  4  . oxyCODONE-acetaminophen (PERCOCET/ROXICET) 5-325 MG per tablet Take 1-2 tablets by mouth every 4 (four) hours as needed for pain.  40 tablet  0  . pantoprazole (PROTONIX) 40 MG tablet Take 1 tablet (40 mg total) by mouth daily at 12 noon.  30 tablet  2  . potassium chloride (K-DUR,KLOR-CON) 10 MEQ tablet Take 10 mEq by mouth daily.      . ranolazine (RANEXA) 500 MG 12 hr tablet Take 1 tablet (500 mg total) by mouth 2 (two) times daily.  30 tablet  0  . tiotropium (SPIRIVA) 18 MCG inhalation capsule Place 1 capsule (18 mcg total) into inhaler and inhale daily.  30 capsule  0   History   Social History  . Marital Status: Single    Spouse Name: N/A    Number of Children: N/A  . Years of Education: N/A   Occupational History  . Not on file.   Social History Main Topics  . Smoking status: Current Some Day Smoker -- 1.00 packs/day for 50 years    Types: Cigarettes    Last Attempt to Quit: 03/16/2012  . Smokeless tobacco: Never Used  . Alcohol Use: 4.2 oz/week    7 Cans of beer per week     Comment: 2 drinks per day  . Drug Use: No  . Sexually Active: Not on file   Other Topics Concern  . Not on file   Social History Narrative  . No narrative on file    Family History  Problem Relation Age of Onset  . Alzheimer's disease Mother   . Cancer Father     prostate  . Cancer Brother     prostate  . Stroke Brother     ROS:  As stated in the HPI and negative for all other systems.   Physical Exam: Blood pressure 168/95, pulse 73, temperature 97.5 F (36.4 C), temperature source Oral, resp. rate 17, height 5\' 4"  (1.626 m), weight 133 lb (60.328 kg), SpO2 98.00%.  GENERAL:  Chronically ill  appearing HEENT:  Pupils equal round and reactive, fundi not visualized, oral mucosa  unremarkable NECK:  Neck vein distention to the angle of the jaw at 45, waveform within normal limits, carotid upstroke brisk and symmetric, no bruits, no thyromegaly LYMPHATICS:  No cervical, inguinal adenopathy LUNGS:  Diffuse crackles with decreased breath sounds BACK:  No CVA tenderness CHEST:  Unremarkable HEART:  PMI not displaced or sustained,S1 and S2 within normal limits, no S3, no S4, no clicks, no rubs, no murmurs, distant heart sounds ABD:  Flat, positive bowel sounds normal in frequency in pitch, no bruits, no rebound, no guarding, no midline pulsatile mass, no hepatomegaly, no splenomegaly, abdominal distention EXT:  2 plus pulses throughout, mild ankle edema, no cyanosis no clubbing SKIN:  No rashes no nodules NEURO:  Cranial nerves II through XII grossly intact, motor grossly intact throughout PSYCH:  Cognitively intact, oriented to person place and time  Labs: Lab Results  Component Value Date   BUN 23 12/02/2012   Lab Results  Component Value Date   CREATININE 1.45* 12/02/2012   Lab Results  Component Value Date   NA 109* 12/02/2012   K 5.4* 12/02/2012   CL 68* 12/02/2012   CO2 29 12/02/2012      Radiology:  CXR:  Congestive heart failure, with small pleural effusions and left  lower lobe atelectasis.    EKG:  Normal sinus rhythm, rate 70, left axis deviation, inferior infarct, poor anterior R wave progression, no change from previous.  12/02/2012   ASSESSMENT AND PLAN:    ACUTE ON CHRONIC SYSTOLIC AND DIASTOLIC HF:  The prognosis is guarded and I did discuss this with the patient's daughter.  He is a full code.  He will be admitted to the ICU and I will start milrinone.  I will ask Dr. Gala Romney to see him in the AM to assist with his care.  Of note the patient was on Losartan at one point but he was taken off of this at the last discharge and was not on an ACE inhibitor.  I am not clear why but I will not restart this until we clarify with the primary cardiology  team.   HYPONATREMIA: Hyponatremia to this degree represents a very poor prognostic indicator and I discussed this with the patient's daughter.  He will have fluid restriction.  I will use a low dose of milrinine.  Given the severity of the hyponatremia and his symptoms of lethargy I will start tolvaptan.  I think the continued use of this should be assessed daily.    TOBACCO ABUSE:  He will continue to be educated.    SignedRollene Rotunda 12/02/2012, 9:18 PM

## 2012-12-03 ENCOUNTER — Encounter (HOSPITAL_COMMUNITY): Payer: Self-pay | Admitting: Cardiology

## 2012-12-03 DIAGNOSIS — E871 Hypo-osmolality and hyponatremia: Secondary | ICD-10-CM

## 2012-12-03 DIAGNOSIS — R0602 Shortness of breath: Secondary | ICD-10-CM

## 2012-12-03 DIAGNOSIS — I5041 Acute combined systolic (congestive) and diastolic (congestive) heart failure: Secondary | ICD-10-CM

## 2012-12-03 DIAGNOSIS — N183 Chronic kidney disease, stage 3 unspecified: Secondary | ICD-10-CM | POA: Diagnosis present

## 2012-12-03 DIAGNOSIS — J441 Chronic obstructive pulmonary disease with (acute) exacerbation: Secondary | ICD-10-CM

## 2012-12-03 DIAGNOSIS — I4891 Unspecified atrial fibrillation: Secondary | ICD-10-CM

## 2012-12-03 DIAGNOSIS — J9620 Acute and chronic respiratory failure, unspecified whether with hypoxia or hypercapnia: Secondary | ICD-10-CM

## 2012-12-03 DIAGNOSIS — I2510 Atherosclerotic heart disease of native coronary artery without angina pectoris: Secondary | ICD-10-CM

## 2012-12-03 DIAGNOSIS — G9340 Encephalopathy, unspecified: Secondary | ICD-10-CM

## 2012-12-03 LAB — MAGNESIUM: Magnesium: 1.8 mg/dL (ref 1.5–2.5)

## 2012-12-03 LAB — POCT I-STAT 3, ART BLOOD GAS (G3+)
Bicarbonate: 37.5 mEq/L — ABNORMAL HIGH (ref 20.0–24.0)
Patient temperature: 98
pH, Arterial: 7.415 (ref 7.350–7.450)
pO2, Arterial: 66 mmHg — ABNORMAL LOW (ref 80.0–100.0)

## 2012-12-03 LAB — BASIC METABOLIC PANEL
BUN: 23 mg/dL (ref 6–23)
GFR calc Af Amer: 58 mL/min — ABNORMAL LOW (ref >90)
GFR calc non Af Amer: 50 mL/min — ABNORMAL LOW (ref >90)
Potassium: 3.7 mEq/L (ref 3.5–5.1)

## 2012-12-03 LAB — CBC WITH DIFFERENTIAL/PLATELET
Basophils Absolute: 0 10*3/uL (ref 0.0–0.1)
Basophils Relative: 0 % (ref 0–1)
Hemoglobin: 9.7 g/dL — ABNORMAL LOW (ref 13.0–17.0)
MCHC: 35 g/dL (ref 30.0–36.0)
Neutro Abs: 11 10*3/uL — ABNORMAL HIGH (ref 1.7–7.7)
Neutrophils Relative %: 80 % — ABNORMAL HIGH (ref 43–77)
Platelets: 281 10*3/uL (ref 150–400)
RDW: 14.8 % (ref 11.5–15.5)

## 2012-12-03 LAB — MRSA PCR SCREENING: MRSA by PCR: NEGATIVE

## 2012-12-03 LAB — COMPREHENSIVE METABOLIC PANEL
AST: 34 U/L (ref 0–37)
Albumin: 3.4 g/dL — ABNORMAL LOW (ref 3.5–5.2)
Alkaline Phosphatase: 123 U/L — ABNORMAL HIGH (ref 39–117)
Chloride: 69 mEq/L — ABNORMAL LOW (ref 96–112)
Potassium: 4.3 mEq/L (ref 3.5–5.1)
Sodium: 112 mEq/L — CL (ref 135–145)
Total Bilirubin: 0.3 mg/dL (ref 0.3–1.2)

## 2012-12-03 MED ORDER — BUDESONIDE 0.25 MG/2ML IN SUSP
0.2500 mg | Freq: Four times a day (QID) | RESPIRATORY_TRACT | Status: DC
  Filled 2012-12-03 (×4): qty 2

## 2012-12-03 MED ORDER — BUDESONIDE 0.25 MG/2ML IN SUSP
0.2500 mg | Freq: Four times a day (QID) | RESPIRATORY_TRACT | Status: DC
  Administered 2012-12-03 – 2012-12-10 (×26): 0.25 mg via RESPIRATORY_TRACT
  Filled 2012-12-03 (×31): qty 2

## 2012-12-03 MED ORDER — ACETAZOLAMIDE SODIUM 500 MG IJ SOLR
500.0000 mg | Freq: Once | INTRAMUSCULAR | Status: AC
  Administered 2012-12-03: 500 mg via INTRAVENOUS
  Filled 2012-12-03: qty 500

## 2012-12-03 MED ORDER — ALBUTEROL SULFATE (5 MG/ML) 0.5% IN NEBU
2.5000 mg | INHALATION_SOLUTION | Freq: Four times a day (QID) | RESPIRATORY_TRACT | Status: DC
  Administered 2012-12-03 – 2012-12-10 (×27): 2.5 mg via RESPIRATORY_TRACT
  Filled 2012-12-03 (×27): qty 0.5

## 2012-12-03 MED ORDER — IPRATROPIUM BROMIDE 0.02 % IN SOLN
0.5000 mg | Freq: Four times a day (QID) | RESPIRATORY_TRACT | Status: DC
  Administered 2012-12-03 – 2012-12-10 (×27): 0.5 mg via RESPIRATORY_TRACT
  Filled 2012-12-03 (×27): qty 2.5

## 2012-12-03 MED ORDER — BUDESONIDE 0.25 MG/2ML IN SUSP
0.2500 mg | Freq: Four times a day (QID) | RESPIRATORY_TRACT | Status: DC
  Administered 2012-12-03: 0.25 mg via RESPIRATORY_TRACT
  Filled 2012-12-03 (×4): qty 2

## 2012-12-03 MED ORDER — ENSURE COMPLETE PO LIQD
237.0000 mL | Freq: Two times a day (BID) | ORAL | Status: DC
  Administered 2012-12-04 – 2012-12-07 (×5): 237 mL via ORAL

## 2012-12-03 MED ORDER — ALBUTEROL SULFATE (5 MG/ML) 0.5% IN NEBU: 2.5000 mg | INHALATION_SOLUTION | RESPIRATORY_TRACT | Status: DC | PRN

## 2012-12-03 MED ORDER — MILRINONE IN DEXTROSE 20 MG/100ML IV SOLN
0.1250 ug/kg/min | INTRAVENOUS | Status: DC
  Administered 2012-12-03 – 2012-12-06 (×3): 0.125 ug/kg/min via INTRAVENOUS
  Filled 2012-12-03 (×3): qty 100

## 2012-12-03 NOTE — Consult Note (Addendum)
PULMONARY  / CRITICAL CARE MEDICINE  Name: [name] [name] MRN: [id number] DOB: 1941-02-05    ADMISSION DATE:  12/02/2012 CONSULTATION DATE:  6/14  REFERRING MD :  Croitoru PRIMARY SERVICE: [facility] Reason: acute resp failure  BRIEF PATIENT DESCRIPTION: 64M with ischemic CM, thyroid Ca and COPD admitted with AMS and acute resp failure, CHF pattern on CXR, wheezing and severe hyponatremia  SIGNIFICANT EVENTS / STUDIES:  6/13 NM whole body scan: Radioiodine uptake at two foci in the right cervical region which may be related to thyroid remnant or lymph nodes. No distant sites of metastasis are definitely identified   LINES / TUBES:   CULTURES: MRSA PCR 6/13 >> NEG  ANTIBIOTICS:   HISTORY OF PRESENT ILLNESS:  Level 5 caveat. Pt is very encephalopathic and unable to provide history. Admission note and hospital records reviewed in detail  PAST MEDICAL HISTORY :  Past Medical History  Diagnosis Date  . Hypertension   . Hyperlipidemia   . Peripheral vascular disease     Hx of Ax-fem BPG, known LCCA disease  . CHF (congestive heart failure)     Ischemic cardiomyopathy EF 30 - 35%  . Cancer     skin cancer, Thyroid cancer  . Stroke 2004    minor  . Dysrhythmia     PAF  . COPD (chronic obstructive pulmonary disease)     PT DENIES USES O2 2 LITERS HS  . Heart murmur     Mild-moderate MR  . Hepatitis     AT AGE  46; unknown type    . Heart attack     03/17/12; subtotally occluded RCI with unsucessful PCI  . Coronary artery disease     Cardiologist is Dr. Nanetta Batty   . Thyroid cancer     Papillary  . Renal insufficiency    Past Surgical History  Procedure Laterality Date  . Bypass graft  07/2010    axillobifemoral BPG  . Spine surgery  11/23/06    microdiscectomy L5-S1  . Hemorrhoid surgery    . Tonsillectomy    . Hernia repair  02/09/11    [facility]ral  . Cardiac catheterization      2013  . Coronary angioplasty      2013  . Thyroidectomy Bilateral 10/07/2012     Procedure: THYROIDECTOMY;  Surgeon: Darletta Moll, MD;  Location: [facility] OR;  Service: ENT;  Laterality: Bilateral;  . Radical neck dissection Left 10/07/2012    Procedure: RADICAL NECK DISSECTION;  Surgeon: Darletta Moll, MD;  Location: [facility] OR;  Service: ENT;  Laterality: Left;   Prior to Admission medications   Medication Sig Start Date End Date Taking? Authorizing Provider  aspirin EC 81 MG tablet Take 81 mg by mouth daily.   Yes Historical Provider, MD  calcium carbonate (OS-CAL - DOSED IN MG OF ELEMENTAL CALCIUM) 1250 MG tablet Take 1 tablet (500 mg of elemental calcium total) by mouth 4 (four) times daily. 10/10/12  Yes Darletta Moll, MD  clopidogrel (PLAVIX) 75 MG tablet Take 75 mg by mouth daily.   Yes Historical Provider, MD  furosemide (LASIX) 40 MG tablet Take 1 tablet (40 mg total) by mouth daily. May take 1 additional dose if needed for swelling 10/22/12  Yes Penny Pia, MD  gabapentin (NEURONTIN) 600 MG tablet Take 600 mg by mouth 2 (two) times daily.    Yes Historical Provider, MD  isosorbide mononitrate (IMDUR) 30 MG 24 hr tablet Take 1 tablet (30 mg total) by mouth  daily. 04/06/12  Yes Nada Boozer, NP  levothyroxine (SYNTHROID) 100 MCG tablet Take 1 tablet (100 mcg total) by mouth daily before breakfast. 10/10/12  Yes Darletta Moll, MD  metoprolol succinate (TOPROL-XL) 25 MG 24 hr tablet Take 25 mg by mouth daily.   Yes Historical Provider, MD  nitroGLYCERIN (NITROSTAT) 0.4 MG SL tablet Place 1 tablet (0.4 mg total) under the tongue every 5 (five) minutes x 3 doses as needed for chest pain. 04/06/12  Yes Nada Boozer, NP  oxyCODONE-acetaminophen (PERCOCET/ROXICET) 5-325 MG per tablet Take 1-2 tablets by mouth every 4 (four) hours as needed for pain. 10/10/12  Yes Darletta Moll, MD  pantoprazole (PROTONIX) 40 MG tablet Take 1 tablet (40 mg total) by mouth daily at 12 noon. 04/06/12  Yes Nada Boozer, NP  potassium chloride (K-DUR,KLOR-CON) 10 MEQ tablet Take 10 mEq by mouth daily.   Yes Historical  Provider, MD  ranolazine (RANEXA) 500 MG 12 hr tablet Take 1 tablet (500 mg total) by mouth 2 (two) times daily. 10/22/12  Yes Penny Pia, MD  tiotropium (SPIRIVA) 18 MCG inhalation capsule Place 1 capsule (18 mcg total) into inhaler and inhale daily. 10/22/12  Yes Penny Pia, MD   Allergies  Allergen Reactions  . Ceclor (Cefaclor) Anaphylaxis    Recently took pcn for tooth problems, without issues  . Morphine And Related Other (See Comments)    Abnormal behavior    FAMILY HISTORY:  Family History  Problem Relation Age of Onset  . Alzheimer's disease Mother   . Cancer Father     prostate  . Cancer Brother     prostate  . Stroke Brother    SOCIAL HISTORY:  reports that he has been smoking Cigarettes.  He has a 50 pack-year smoking history. He has never used smokeless tobacco. He reports that he drinks about 4.2 ounces of alcohol per week. He reports that he does not use illicit drugs.  REVIEW OF SYSTEMS:  Level 5 caveat  SUBJECTIVE:   VITAL SIGNS: Temp:  [97.5 F (36.4 C)-98.7 F (37.1 C)] 98.3 F (36.8 C) (06/14 1600) Pulse Rate:  [26-111] 81 (06/14 1600) Resp:  [11-27] 19 (06/14 1500) BP: (111-189)/(43-112) 135/105 mmHg (06/14 1500) SpO2:  [83 %-100 %] 97 % (06/14 1500) FiO2 (%):  [40 %] 40 % (06/13 2057) Weight:  [60.328 kg (133 lb)-63.1 kg (139 lb 1.8 oz)] 62.2 kg (137 lb 2 oz) (06/14 0438) HEMODYNAMICS:   VENTILATOR SETTINGS: Vent Mode:  [-] BIPAP FiO2 (%):  [40 %] 40 % Set Rate:  [15 bmp] 15 bmp INTAKE / OUTPUT: Intake/Output     06/13 0701 - 06/14 0700 06/14 0701 - 06/15 0700   P.O. 240 180   I.V. (mL/kg) 56.8 (0.9) 124 (2)   Total Intake(mL/kg) 296.8 (4.8) 304 (4.9)   Urine (mL/kg/hr) 2700 1550 (2.2)   Total Output 2700 1550   Net -2403.3 -1246        Stool Occurrence 1 x      PHYSICAL EXAMINATION: General:  RASS -2. moaning and writhing occasionally Neuro:  Altered cognition, MAEs, DTRs symmetric HEENT:  NCAT, EOMI, PERRL Cardiovascular:  RRR s  M Lungs:  Bibasilar crackles, prolonged diffuse exp wheezes Abdomen:  Soft, NT, NABS Ext: warm, no edema  LABS:  Recent Labs Lab 12/02/12 1939 12/02/12 2347 12/03/12 0525 12/03/12 0830  HGB 10.5* 9.7*  --   --   WBC 14.0* 13.8*  --   --   PLT 315 281  --   --  NA 109* 112* 116*  --   K 5.4* 4.3 3.7  --   CL 68* 69* 72*  --   CO2 29 32 31  --   GLUCOSE 97 97 81  --   BUN 23 24* 23  --   CREATININE 1.45* 1.50* 1.37*  --   CALCIUM 8.9 8.2* 7.8*  --   MG  --  1.8  --   --   AST  --  34  --   --   ALT  --  27  --   --   ALKPHOS  --  123*  --   --   BILITOT  --  0.3  --   --   PROT  --  6.8  --   --   ALBUMIN  --  3.4*  --   --   INR  --  1.32  --   --   PROBNP 64029.0*  --   --   --   PHART  --   --   --  7.415  PCO2ART  --   --   --  58.2*  PO2ART  --   --   --  66.0*   No results found for this basename: GLUCAP,  in the last 168 hours  CXR: edema pattern with L>R effusions  ASSESSMENT / PLAN:  PULMONARY A: Acute on chronic resp failure Pulmonary edema Smoker COPD with acute wheezing P:   Cont supplemental O2 Nebulized steroids and BDs  CARDIOVASCULAR A: CAD CHF, systolic and diastolic PVD P:  Mgmt per Cards  RENAL A:  CKD Severe hyponatremia, recurrent Metabolic alkalosis P:   Free water restrict Tolvaptan Diamox X 1 dose   GASTROINTESTINAL A:  No issues P:   NPO until cognition permits safe PO intake  HEMATOLOGIC/ONC A:  Thyroid Ca Anemia P:  Monitor  INFECTIOUS A:  No issues P:   Monitor off abx  ENDOCRINE A:  S/p thyroidectomy P:   Cont levothyroxine  NEUROLOGIC A:  Acute encephalopathy due to hyponatremia P:   Monitor as corrected  TODAY'S SUMMARY:   I have personally obtained a history, examined the patient, evaluated laboratory and imaging results, formulated the assessment and plan and placed orders. CRITICAL CARE: The patient is critically ill with multiple organ systems failure and requires high complexity  decision making for assessment and support, frequent evaluation and titration of therapies, application of advanced monitoring technologies and extensive interpretation of multiple databases. Critical Care Time devoted to patient care services described in this note is   minutes.   Billy Fischer, MD ; [facility] [phone].  After 5:30 PM or weekends, call [phone]  Pulmonary and Critical Care Medicine [facility] Pager: [phone]  12/03/2012, 6:32 PM

## 2012-12-03 NOTE — Progress Notes (Signed)
Replaced condom catheter three times since 0700 today. Patient restless in bed and pulls at catheter. Receiving Lasix IVP. Will monitor.

## 2012-12-03 NOTE — Consult Note (Signed)
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 He was taken to the cath lab by Dr. Eldridge Dace who was unable to revascularize the dominant right coronary. This hospitalization was complicated by atrial fibrillation and heart failure. He also has a history of oxygen dependent COPD, peripheral vascular disease. He is is status post left axillary-femoral and fem-fem bypass grafting in 2002. In Dec 2013 he had carotid Dopplers which revealed a high-grade left common carotid artery stenosis which correlates to a 70% stenosis by CT angiography. He has hypertension, dyslipidemia, history of tobacco abuse, and a history of CVA. He has Thyroid cancer and radical neck surgery in April 2014. He has an ischemic cardiomyopathy with an ejection fraction of 30-35% by echocardiogram on October 2013 and briefly wore a LifeVest. Then in November of 2013 an echocardiogram revealed an EF of 35-40% and his LifeVest was discontinued. He was admitted in April 2014 after his thyroid surgerywith respiratory failure felt to be acute on chronic combined CHF. His EF then was down to 30-35%.           He is admitted now with a similar presentation- acute on chronic combined systolic and diastolic CHF with acute on chronic respiratory failure. He also has significant hyponatremia and mental status changes. This morning he would only grimace with sternal rub. He has been placed and Milrinone and is diuresing.    PMHx:  Past Medical History  Diagnosis Date  . Hypertension   . Hyperlipidemia   . Peripheral vascular disease   . Shoulder fracture, left   . CHF (congestive heart failure)     Ischemic cardiomyopathy EF 30 - 35%  . Cancer     skin cancer  . Stroke 2004    minor  . Dysrhythmia     PAF  . COPD (chronic obstructive pulmonary disease)     PT DENIES USES O2 2 LITERS HS  .  Heart murmur   . Hepatitis     AT AGE  16; unknown type    . Heart attack     03/17/12; subtotally occluded RCI with unsucessful PCI  . Coronary artery disease     Cardiologist is Dr. Nanetta Batty   . Thyroid cancer     Papillary   Past Surgical History  Procedure Laterality Date  . Bypass graft  07/2010    axillobifemoral BPG  . Spine surgery  11/23/06    microdiscectomy L5-S1  . Hemorrhoid surgery    . Tonsillectomy    . Hernia repair  02/09/11    Ventral  . Cardiac catheterization      2013  . Coronary angioplasty      2013  . Thyroidectomy Bilateral 10/07/2012    Procedure: THYROIDECTOMY;  Surgeon: Darletta Moll, MD;  Location: [facility] OR;  Service: ENT;  Laterality: Bilateral;  . Radical neck dissection Left 10/07/2012    Procedure: RADICAL NECK DISSECTION;  Surgeon: Darletta Moll, MD;  Location: [facility] OR;  Service: ENT;  Laterality: Left;    FAMHx: Family History  Problem Relation Age of Onset  . Alzheimer's disease Mother   . Cancer Father     prostate  . Cancer Brother     prostate  . Stroke Brother     SOCHx:  reports that he has been smoking Cigarettes.  He has a 50 pack-year smoking history. He has  never used smokeless tobacco. He reports that he drinks about 4.2 ounces of alcohol per week. He reports that he does not use illicit drugs.  ALLERGIES: Allergies  Allergen Reactions  . Ceclor (Cefaclor) Anaphylaxis    Recently took pcn for tooth problems, without issues  . Morphine And Related Other (See Comments)    Abnormal behavior    ROS: Review of systems not obtained due to patient factors.  HOME MEDICATIONS: Prescriptions prior to admission  Medication Sig Dispense Refill  . aspirin EC 81 MG tablet Take 81 mg by mouth daily.      . calcium carbonate (OS-CAL - DOSED IN MG OF ELEMENTAL CALCIUM) 1250 MG tablet Take 1 tablet (500 mg of elemental calcium total) by mouth 4 (four) times daily.  120 tablet  5  . clopidogrel (PLAVIX) 75 MG tablet Take 75 mg by mouth  daily.      . furosemide (LASIX) 40 MG tablet Take 1 tablet (40 mg total) by mouth daily. May take 1 additional dose if needed for swelling  30 tablet  0  . gabapentin (NEURONTIN) 600 MG tablet Take 600 mg by mouth 2 (two) times daily.       . isosorbide mononitrate (IMDUR) 30 MG 24 hr tablet Take 1 tablet (30 mg total) by mouth daily.  30 tablet  6  . levothyroxine (SYNTHROID) 100 MCG tablet Take 1 tablet (100 mcg total) by mouth daily before breakfast.  30 tablet  11  . metoprolol succinate (TOPROL-XL) 25 MG 24 hr tablet Take 25 mg by mouth daily.      . nitroGLYCERIN (NITROSTAT) 0.4 MG SL tablet Place 1 tablet (0.4 mg total) under the tongue every 5 (five) minutes x 3 doses as needed for chest pain.  25 tablet  4  . oxyCODONE-acetaminophen (PERCOCET/ROXICET) 5-325 MG per tablet Take 1-2 tablets by mouth every 4 (four) hours as needed for pain.  40 tablet  0  . pantoprazole (PROTONIX) 40 MG tablet Take 1 tablet (40 mg total) by mouth daily at 12 noon.  30 tablet  2  . potassium chloride (K-DUR,KLOR-CON) 10 MEQ tablet Take 10 mEq by mouth daily.      . ranolazine (RANEXA) 500 MG 12 hr tablet Take 1 tablet (500 mg total) by mouth 2 (two) times daily.  30 tablet  0  . tiotropium (SPIRIVA) 18 MCG inhalation capsule Place 1 capsule (18 mcg total) into inhaler and inhale daily.  30 capsule  0    HOSPITAL MEDICATIONS: I have reviewed the patient's current medications.  VITALS: Blood pressure 161/87, pulse 71, temperature 97.6 F (36.4 C), temperature source Oral, resp. rate 23, height 5\' 9"  (1.753 m), weight 62.2 kg (137 lb 2 oz), SpO2 96.00%.  PHYSICAL EXAM: General appearance: no distress, uncooperative and responds only to vigorous stimulation Neck: surgical scar Lungs: decreased breath sounds Heart: regular rate and rhythm Abdomen: not distended Extremities: no edema Pulses: diminnished Skin: cool, dry Neurologic: responds to vigorous stimulation  LABS: Results for orders placed  during the hospital encounter of 12/02/12 (from the past 48 hour(s))  CBC     Status: Abnormal   Collection Time    12/02/12  7:39 PM      Result Value Range   WBC 14.0 (*) 4.0 - 10.5 K/uL   RBC 3.74 (*) 4.22 - 5.81 MIL/uL   Hemoglobin 10.5 (*) 13.0 - 17.0 g/dL   HCT 40.9 (*) 81.1 - 91.4 %   MCV 79.7  78.0 - 100.0  fL   MCH 28.1  26.0 - 34.0 pg   MCHC 35.2  30.0 - 36.0 g/dL   RDW 16.1  09.6 - 04.5 %   Platelets 315  150 - 400 K/uL  BASIC METABOLIC PANEL     Status: Abnormal   Collection Time    12/02/12  7:39 PM      Result Value Range   Sodium 109 (*) 135 - 145 mEq/L   Comment: CRITICAL RESULT CALLED TO, READ BACK BY AND VERIFIED WITH:     DR [id number] 2020 12/02/12 WBOND   Potassium 5.4 (*) 3.5 - 5.1 mEq/L   Chloride 68 (*) 96 - 112 mEq/L   CO2 29  19 - 32 mEq/L   Glucose, Bld 97  70 - 99 mg/dL   BUN 23  6 - 23 mg/dL   Creatinine, Ser 9.14 (*) 0.50 - 1.35 mg/dL   Calcium 8.9  8.4 - 78.2 mg/dL   GFR calc non Af Amer 47 (*) >90 mL/min   GFR calc Af Amer 54 (*) >90 mL/min   Comment:            The eGFR has been calculated     using the CKD EPI equation.     This calculation has not been     validated in all clinical     situations.     eGFR's persistently     <90 mL/min signify     possible Chronic Kidney Disease.  PRO B NATRIURETIC PEPTIDE     Status: Abnormal   Collection Time    12/02/12  7:39 PM      Result Value Range   Pro B Natriuretic peptide (BNP) 64029.0 (*) 0 - 125 pg/mL  POCT I-STAT TROPONIN I     Status: None   Collection Time    12/02/12  8:01 PM      Result Value Range   Troponin i, poc 0.01  0.00 - 0.08 ng/mL   Comment 3            Comment: Due to the release kinetics of cTnI,     a negative result within the first hours     of the onset of symptoms does not rule out     myocardial infarction with certainty.     If myocardial infarction is still suspected,     repeat the test at appropriate intervals.  MRSA PCR SCREENING     Status: None    Collection Time    12/02/12 10:55 PM      Result Value Range   MRSA by PCR NEGATIVE  NEGATIVE   Comment:            The GeneXpert MRSA Assay (FDA     approved for NASAL specimens     only), is one component of a     comprehensive MRSA colonization     surveillance program. It is not     intended to diagnose MRSA     infection nor to guide or     monitor treatment for     MRSA infections.  CBC WITH DIFFERENTIAL     Status: Abnormal   Collection Time    12/02/12 11:47 PM      Result Value Range   WBC 13.8 (*) 4.0 - 10.5 K/uL   RBC 3.43 (*) 4.22 - 5.81 MIL/uL   Hemoglobin 9.7 (*) 13.0 - 17.0 g/dL   HCT 95.6 (*) 21.3 - 08.6 %   MCV  80.8  78.0 - 100.0 fL   MCH 28.3  26.0 - 34.0 pg   MCHC 35.0  30.0 - 36.0 g/dL   RDW 78.2  95.6 - 21.3 %   Platelets 281  150 - 400 K/uL   Neutrophils Relative % 80 (*) 43 - 77 %   Neutro Abs 11.0 (*) 1.7 - 7.7 K/uL   Lymphocytes Relative 10 (*) 12 - 46 %   Lymphs Abs 1.4  0.7 - 4.0 K/uL   Monocytes Relative 10  3 - 12 %   Monocytes Absolute 1.4 (*) 0.1 - 1.0 K/uL   Eosinophils Relative 0  0 - 5 %   Eosinophils Absolute 0.0  0.0 - 0.7 K/uL   Basophils Relative 0  0 - 1 %   Basophils Absolute 0.0  0.0 - 0.1 K/uL  COMPREHENSIVE METABOLIC PANEL     Status: Abnormal   Collection Time    12/02/12 11:47 PM      Result Value Range   Sodium 112 (*) 135 - 145 mEq/L   Comment: CRITICAL RESULT CALLED TO, READ BACK BY AND VERIFIED WITH:     TURNER G,RN 12/03/12 0050 WAYK   Potassium 4.3  3.5 - 5.1 mEq/L   Comment: DELTA CHECK NOTED   Chloride 69 (*) 96 - 112 mEq/L   CO2 32  19 - 32 mEq/L   Glucose, Bld 97  70 - 99 mg/dL   BUN 24 (*) 6 - 23 mg/dL   Creatinine, Ser 0.86 (*) 0.50 - 1.35 mg/dL   Calcium 8.2 (*) 8.4 - 10.5 mg/dL   Total Protein 6.8  6.0 - 8.3 g/dL   Albumin 3.4 (*) 3.5 - 5.2 g/dL   AST 34  0 - 37 U/L   ALT 27  0 - 53 U/L   Alkaline Phosphatase 123 (*) 39 - 117 U/L   Total Bilirubin 0.3  0.3 - 1.2 mg/dL   GFR calc non Af Amer 45 (*) >90  mL/min   GFR calc Af Amer 52 (*) >90 mL/min   Comment:            The eGFR has been calculated     using the CKD EPI equation.     This calculation has not been     validated in all clinical     situations.     eGFR's persistently     <90 mL/min signify     possible Chronic Kidney Disease.  PROTIME-INR     Status: Abnormal   Collection Time    12/02/12 11:47 PM      Result Value Range   Prothrombin Time 16.1 (*) 11.6 - 15.2 seconds   INR 1.32  0.00 - 1.49  MAGNESIUM     Status: None   Collection Time    12/02/12 11:47 PM      Result Value Range   Magnesium 1.8  1.5 - 2.5 mg/dL  BASIC METABOLIC PANEL     Status: Abnormal   Collection Time    12/03/12  5:25 AM      Result Value Range   Sodium 116 (*) 135 - 145 mEq/L   Comment: CRITICAL RESULT CALLED TO, READ BACK BY AND VERIFIED WITH:     WILSONLRN [id number] MCCAULEG   Potassium 3.7  3.5 - 5.1 mEq/L   Chloride 72 (*) 96 - 112 mEq/L   CO2 31  19 - 32 mEq/L   Glucose, Bld 81  70 - 99 mg/dL  BUN 23  6 - 23 mg/dL   Creatinine, Ser 1.61 (*) 0.50 - 1.35 mg/dL   Calcium 7.8 (*) 8.4 - 10.5 mg/dL   GFR calc non Af Amer 50 (*) >90 mL/min   GFR calc Af Amer 58 (*) >90 mL/min   Comment:            The eGFR has been calculated     using the CKD EPI equation.     This calculation has not been     validated in all clinical     situations.     eGFR's persistently     <90 mL/min signify     possible Chronic Kidney Disease.    EKG: NSR, inferior Qs.  IMAGING: Dg Chest 2 View  12/02/2012   *RADIOLOGY REPORT*  Clinical Data: Worsening shortness breath and cough.  Congestive heart failure.  Prior myocardial infarction. Thyroid carcinoma.  CHEST - 2 VIEW  Comparison: 10/19/2012  Findings: Moderate cardiomegaly again noted.  Increased diffuse interstitial infiltrates are seen, consistent with diffuse interstitial edema.  Small bilateral pleural effusions are seen, left side greater than right, with left basilar atelectasis.   IMPRESSION:  Congestive heart failure, with small pleural effusions and left lower lobe atelectasis.   Original Report Authenticated By: Myles Rosenthal, M.D.   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 These may represent thyroid remnant post thyroidectomy though cervical nodes involved with metastatic thyroid cancer are not excluded. No definite residual iodine accumulation is seen within the left cervical region. Extensive tracer accumulation is identified stomach and colon. Small amount of excreted tracer within urinary bladder. No definite additional sites of metastatic uptake are identified.  IMPRESSION: Radioiodine uptake at two foci in the right cervical region which may be related to thyroid remnant or lymph nodes. Significant physiologic tracer within the abdomen and stomach, colon and bladder. No distant sites of metastasis are definitely identified.   Original Report Authenticated By: Ulyses Southward, M.D.    IMPRESSION: Principal Problem:   Acute on chronic combined systolic and diastolic congestive heart failure- EF of 30-35% via echo in 4/14 Active Problems:   Acute and chronic respiratory failure   COPD (chronic obstructive pulmonary disease)   Altered mental status   COPD exacerbation   CAD (coronary artery disease)   Chronic renal disease, stage III   Tobacco abuse, pt quit after MI   PVD, Lt Ax-fem and fem-fem BPG 2/12, Dr Manson Passey follows   HTN (hypertension)   Dyslipidemia   History of CVA (cerebrovascular accident)   PAF, post MI 9/13   Malignant neoplasm of thyroid gland- surgey April 2014   Acute hyponatremia   RECOMMENDATION: CHF consult. Will ask CCM to see as well as it appears he may be close to  intubation.    Time Spent Directly with Patient: 45 minutes  Abelino Derrick [phone] beeper 12/03/2012, 8:11 AM   I have seen and examined the patient along with Abelino Derrick, PA.  I have reviewed the chart, notes and new data.  I agree with PA's note.  Key new complaints: he is barely arousable, although tries to mumble a few words in response to loud voice Key examination changes: JVP 8-10 cm, prominent v waves; diffusely reduced breath sounds, no  frank rales or wheezes Key new findings / data: slowly improving hyponatremia, marked hypercapnia, but with (over)compensatory metabolic alkalosis, oxygen saturation acceptable.  PLAN: Principal problem appears to be decompensated CHF and cardiorenal syndrome, both improving with diuretics and inotropes, but encephalopathic state is very troubling, Note that he has had marked metabolic alkalosis on previous admission (diuretic related?). Not sure whether encephalopathy is primarily related to hyponatremia or hypercapnia. History of heavy smoking and COPD - he is probably a CO2 retainer and high flow oxygen should be avoided. May need assisted ventilation. Consult PCCM. Prognosis is very guarded.  Thurmon Fair, MD, [facility] [phone] 12/03/2012, 9:01 AM

## 2012-12-03 NOTE — Progress Notes (Signed)
INITIAL NUTRITION ASSESSMENT  DOCUMENTATION CODES Per approved criteria  -Not Applicable   INTERVENTION: - Ensure Complete BID - Encouraged increased meal intake - Unit RD to continue to monitor   NUTRITION DIAGNOSIS: Inadequate oral intake related to poor appetite as evidenced by 25% meal intake.   Goal: Pt to consume >90% of meal/supplements  Monitor:  Weights, labs, intake  Reason for Assessment: Nutrition risk   72 y.o. male  Admitting Dx: Acute on chronic combined systolic and diastolic congestive heart failure  ASSESSMENT: Pt admitted with dyspnea and has history of ischemic cardiomyopathy and thyroid cancer.  Pt with acute on chronic heart failure. Pt has been following very low salt diet, weight is down 6 pounds in the past 2 months. Pt reports poor appetite for the past week but states before then he was eating 2 meals/day. Pt's intake poor today, 25% of meals. Pt agreeable to trying Ensure. Pt with very low sodium, 116 mEq/L today which is improved from 109 mEq/L yesterday.   Height: Ht Readings from Last 1 Encounters:  12/02/12 5\' 9"  (1.753 m)    Weight: Wt Readings from Last 1 Encounters:  12/03/12 137 lb 2 oz (62.2 kg)    Ideal Body Weight: 160 lb  % Ideal Body Weight: 86  Wt Readings from Last 10 Encounters:  12/03/12 137 lb 2 oz (62.2 kg)  10/22/12 138 lb 3.2 oz (62.687 kg)  10/07/12 143 lb 4.8 oz (65 kg)  10/07/12 143 lb 4.8 oz (65 kg)  10/04/12 141 lb 9.6 oz (64.229 kg)  09/29/12 142 lb (64.411 kg)  06/09/12 135 lb 2.3 oz (61.3 kg)  05/18/12 122 lb 12.7 oz (55.7 kg)  04/30/12 129 lb 9.6 oz (58.786 kg)  04/27/12 131 lb 6.4 oz (59.603 kg)    Usual Body Weight: 143 lb  % Usual Body Weight: 96  BMI:  Body mass index is 20.24 kg/(m^2).  Estimated Nutritional Needs: Kcal: 1850-2100 Protein: 85-95g Fluid: 1.8-2.1L/day  Skin: Intact  Diet Order: Cardiac  EDUCATION NEEDS: -No education needs identified at this time   Intake/Output  Summary (Last 24 hours) at 12/03/12 1556 Last data filed at 12/03/12 1500  Gross per 24 hour  Intake 575.95 ml  Output   4250 ml  Net -3674.05 ml    Last BM: 6/14  Labs:   Recent Labs Lab 12/02/12 1939 12/02/12 2347 12/03/12 0525  NA 109* 112* 116*  K 5.4* 4.3 3.7  CL 68* 69* 72*  CO2 29 32 31  BUN 23 24* 23  CREATININE 1.45* 1.50* 1.37*  CALCIUM 8.9 8.2* 7.8*  MG  --  1.8  --   GLUCOSE 97 97 81    CBG (last 3)  No results found for this basename: GLUCAP,  in the last 72 hours  Scheduled Meds: . albuterol  2.5 mg Nebulization Q6H  . aspirin EC  81 mg Oral Daily  . budesonide  0.25 mg Nebulization Q6H  . calcium carbonate  1 tablet Oral TID AC & HS  . clopidogrel  75 mg Oral Q breakfast  . furosemide  80 mg Intravenous Q12H  . heparin  5,000 Units Subcutaneous Q8H  . ipratropium  0.5 mg Nebulization Q6H  . levothyroxine  100 mcg Oral QAC breakfast  . metoprolol succinate  25 mg Oral Daily  . pantoprazole  40 mg Oral Q1200  . potassium chloride  10 mEq Oral Daily  . ranolazine  500 mg Oral BID  . sodium chloride  3 mL  Intravenous Q12H  . tiotropium  18 mcg Inhalation Daily  . tolvaptan  15 mg Oral QHS    Continuous Infusions: . milrinone 0.125 mcg/kg/min (12/03/12 1610)    Past Medical History  Diagnosis Date  . Hypertension   . Hyperlipidemia   . Peripheral vascular disease     Hx of Ax-fem BPG, known LCCA disease  . CHF (congestive heart failure)     Ischemic cardiomyopathy EF 30 - 35%  . Cancer     skin cancer, Thyroid cancer  . Stroke 2004    minor  . Dysrhythmia     PAF  . COPD (chronic obstructive pulmonary disease)     PT DENIES USES O2 2 LITERS HS  . Heart murmur     Mild-moderate MR  . Hepatitis     AT AGE  69; unknown type    . Heart attack     03/17/12; subtotally occluded RCI with unsucessful PCI  . Coronary artery disease     Cardiologist is Dr. Nanetta Batty   . Thyroid cancer     Papillary  . Renal insufficiency     Past  Surgical History  Procedure Laterality Date  . Bypass graft  07/2010    axillobifemoral BPG  . Spine surgery  11/23/06    microdiscectomy L5-S1  . Hemorrhoid surgery    . Tonsillectomy    . Hernia repair  02/09/11    [facility]  . Cardiac catheterization      2013  . Coronary angioplasty      2013  . Thyroidectomy Bilateral 10/07/2012    Procedure: THYROIDECTOMY;  Surgeon: Darletta Moll, MD;  Location: [facility] OR;  Service: ENT;  Laterality: Bilateral;  . Radical neck dissection Left 10/07/2012    Procedure: RADICAL NECK DISSECTION;  Surgeon: Darletta Moll, MD;  Location: [facility] OR;  Service: ENT;  Laterality: Left;     Levon Hedger MS, RD, LDN [phone] Weekend/After Hours Pager

## 2012-12-03 NOTE — Progress Notes (Signed)
CRITICAL VALUE ALERT  Critical value received:  Na=112  Date of notification: 12/03/2012  Time of notification:  0050  Critical value read back:yes  Nurse who received alert:  Charna Elizabeth RN  MD notified (1st page):  Not called; MD aware of previous value =109  Time of first page:  Not called  MD notified (2nd page):  Time of second page:  Responding MD:  Not called  Time MD responded:  Not called

## 2012-12-03 NOTE — ED Provider Notes (Signed)
Medical screening examination/treatment/procedure(s) were conducted as a shared visit with non-physician practitioner(s) and myself.  I personally evaluated the patient during the encounter.   Patient presented with difficulty breathing. Patient appear to be in mild respiratory distress there are, but when he was placed in the bed and stopped going, he improved. He has a history of CHF and COPD. Workup is consistent with significant decompensation of CHF as well as marked hyponatremia and electrolyte abnormality. Patient admitted for treatment.  Gilda Crease, MD 12/03/12 [phone]

## 2012-12-04 ENCOUNTER — Inpatient Hospital Stay (HOSPITAL_COMMUNITY): Payer: Medicare Other

## 2012-12-04 DIAGNOSIS — J90 Pleural effusion, not elsewhere classified: Secondary | ICD-10-CM

## 2012-12-04 DIAGNOSIS — E039 Hypothyroidism, unspecified: Secondary | ICD-10-CM

## 2012-12-04 DIAGNOSIS — R4182 Altered mental status, unspecified: Secondary | ICD-10-CM

## 2012-12-04 DIAGNOSIS — I2589 Other forms of chronic ischemic heart disease: Secondary | ICD-10-CM

## 2012-12-04 LAB — POTASSIUM: Potassium: 3.7 mEq/L (ref 3.5–5.1)

## 2012-12-04 LAB — BASIC METABOLIC PANEL
Chloride: 85 mEq/L — ABNORMAL LOW (ref 96–112)
Creatinine, Ser: 1.61 mg/dL — ABNORMAL HIGH (ref 0.50–1.35)
GFR calc Af Amer: 48 mL/min — ABNORMAL LOW (ref >90)
GFR calc non Af Amer: 41 mL/min — ABNORMAL LOW (ref >90)
Potassium: 2.7 mEq/L — CL (ref 3.5–5.1)

## 2012-12-04 MED ORDER — FUROSEMIDE 10 MG/ML IJ SOLN
40.0000 mg | Freq: Two times a day (BID) | INTRAMUSCULAR | Status: DC
  Administered 2012-12-04 – 2012-12-06 (×4): 40 mg via INTRAVENOUS
  Filled 2012-12-04 (×6): qty 4

## 2012-12-04 MED ORDER — STARCH (THICKENING) PO POWD: ORAL | Status: DC | PRN

## 2012-12-04 MED ORDER — POTASSIUM CHLORIDE CRYS ER 20 MEQ PO TBCR
40.0000 meq | EXTENDED_RELEASE_TABLET | Freq: Once | ORAL | Status: AC
  Administered 2012-12-04: 40 meq via ORAL

## 2012-12-04 MED ORDER — POTASSIUM CHLORIDE CRYS ER 20 MEQ PO TBCR
20.0000 meq | EXTENDED_RELEASE_TABLET | Freq: Two times a day (BID) | ORAL | Status: DC
  Administered 2012-12-04 – 2012-12-09 (×10): 20 meq via ORAL
  Filled 2012-12-04 (×11): qty 1

## 2012-12-04 MED ORDER — RESOURCE THICKENUP CLEAR PO POWD
ORAL | Status: DC | PRN
  Filled 2012-12-04 (×2): qty 125

## 2012-12-04 MED ORDER — POTASSIUM CHLORIDE CRYS ER 20 MEQ PO TBCR
40.0000 meq | EXTENDED_RELEASE_TABLET | ORAL | Status: AC
  Administered 2012-12-04 (×2): 40 meq via ORAL
  Filled 2012-12-04: qty 1
  Filled 2012-12-04 (×2): qty 2

## 2012-12-04 NOTE — Progress Notes (Signed)
PULMONARY  / CRITICAL CARE MEDICINE  Name: [name] [name] MRN: [id number] DOB: 04/24/1941    ADMISSION DATE:  12/02/2012 CONSULTATION DATE:  6/14  REFERRING MD :  Croitoru PRIMARY SERVICE: [facility] Reason: acute resp failure  BRIEF PATIENT DESCRIPTION: 17M with ischemic CM, thyroid Ca and COPD admitted with AMS and acute resp failure, CHF pattern on CXR, wheezing and severe hyponatremia  SIGNIFICANT EVENTS / STUDIES:  6/13 NM whole body scan: Radioiodine uptake at two foci in the right cervical region which may be related to thyroid remnant or lymph nodes. No distant sites of metastasis are definitely identified   LINES / TUBES:   CULTURES: MRSA PCR 6/13 >> NEG  ANTIBIOTICS:    SUBJECTIVE:  Cognition much improved. Sensorium normal. No distress. Denies dyspnea. No new complaints.    VITAL SIGNS: Temp:  [97.7 F (36.5 C)-99.1 F (37.3 C)] 98.2 F (36.8 C) (06/15 1200) Pulse Rate:  [75-81] 75 (06/15 1213) Resp:  [14-22] 20 (06/15 1400) BP: (115-196)/(44-154) 196/154 mmHg (06/15 1400) SpO2:  [79 %-100 %] 99 % (06/15 1457) Weight:  [56 kg (123 lb 7.3 oz)] 56 kg (123 lb 7.3 oz) (06/15 0500) HEMODYNAMICS:   VENTILATOR SETTINGS:   INTAKE / OUTPUT: Intake/Output     06/14 0701 - 06/15 0700 06/15 0701 - 06/16 0700   P.O. 420 480   I.V. (mL/kg) 297.6 (5.3) 19.8 (0.4)   Total Intake(mL/kg) 717.6 (12.8) 499.8 (8.9)   Urine (mL/kg/hr) 3125 (2.3) 200 (0.4)   Total Output 3125 200   Net -2407.4 +299.8        Urine Occurrence  1 x     PHYSICAL EXAMINATION: General:  RASS 0. NAD Neuro:  intact HEENT: hoarse voice quality, otherwise WNL Cardiovascular:  RRR s M Lungs:  Faint bibasilar crackles, wheezes resolved Abdomen:  Soft, NT, NABS Ext: warm, no edema  LABS:  Recent Labs Lab 12/02/12 1939 12/02/12 2347 12/03/12 0525 12/03/12 0830 12/04/12 0544  HGB 10.5* 9.7*  --   --   --   WBC 14.0* 13.8*  --   --   --   PLT 315 281  --   --   --   NA 109* 112* 116*  --   132*  K 5.4* 4.3 3.7  --  2.7*  CL 68* 69* 72*  --  85*  CO2 29 32 31  --  36*  GLUCOSE 97 97 81  --  92  BUN 23 24* 23  --  28*  CREATININE 1.45* 1.50* 1.37*  --  1.61*  CALCIUM 8.9 8.2* 7.8*  --  8.2*  MG  --  1.8  --   --   --   AST  --  34  --   --   --   ALT  --  27  --   --   --   ALKPHOS  --  123*  --   --   --   BILITOT  --  0.3  --   --   --   PROT  --  6.8  --   --   --   ALBUMIN  --  3.4*  --   --   --   INR  --  1.32  --   --   --   PROBNP 64029.0*  --   --   --   --   PHART  --   --   --  7.415  --   PCO2ART  --   --   --  58.2*  --   PO2ART  --   --   --  66.0*  --    No results found for this basename: GLUCAP,  in the last 168 hours  CXR: improved interstitial edema, persistent L effusion vs infitrate  ASSESSMENT / PLAN:  PULMONARY A: Acute on chronic resp failure, much improved Pulmonary edema, clinically and radiographically improved Smoker COPD with resolved bronchospasm Suspected L effusion of unclear etiology (suspect chronic component) P:   Cont supplemental O2 Cont nebulized steroids and BDs PA/lat CXR ordered for AM 6/16  CARDIOVASCULAR A: CAD CHF, systolic and diastolic PVD P:  Mgmt per Cards  RENAL A:  CKD Severe hyponatremia - much improved Metabolic alkalosis P:   Cont free water restrict Agree with D/C of Tolvaptan Monitor chemistries  GASTROINTESTINAL A:  No issues P:   Advance diet  HEMATOLOGIC/ONC A:  Thyroid Ca Anemia P:  Monitor CBC  INFECTIOUS A:  No issues. Doubt Xray findings represent PNA given absence of clinical findings to support this dx P:   Monitor off abx F/U CXR 6/16  ENDOCRINE A:  S/p thyroidectomy Markedly elevated TSH c/w noncompliance It is possible that he was instructed to hold L thyroxine for scan performed 6/13. He is uncertain P:   Cont levothyroxine  NEUROLOGIC A:  Acute encephalopathy due to hyponatremia, resolved P:   Monitor as corrected   Billy Fischer, MD ; [facility]  service Mobile [phone].  After 5:30 PM or weekends, call [phone]  Pulmonary and Critical Care Medicine [facility]  12/04/2012, 3:04 PM

## 2012-12-04 NOTE — Progress Notes (Signed)
eLink Physician-Brief Progress Note Patient Name: DRUE HARR DOB: 1940-09-09 MRN: [id number]  Date of Service  12/04/2012   HPI/Events of Note   Hypokalemia  eICU Interventions  Potassium replaced   Intervention Category Intermediate Interventions: Electrolyte abnormality - evaluation and management  DETERDING,ELIZABETH 12/04/2012, 6:44 AM

## 2012-12-04 NOTE — Progress Notes (Signed)
Subjective:  Awake and alert this am.  Objective:  Vital Signs in the last 24 hours: Temp:  [97.7 F (36.5 C)-99.1 F (37.3 C)] 98.7 F (37.1 C) (06/15 0400) Pulse Rate:  [81] 81 (06/14 1600) Resp:  [14-22] 21 (06/15 0700) BP: (111-182)/(43-106) 162/71 mmHg (06/15 0700) SpO2:  [79 %-100 %] 95 % (06/15 0700) Weight:  [56 kg (123 lb 7.3 oz)] 56 kg (123 lb 7.3 oz) (06/15 0500)  Intake/Output from previous day:  Intake/Output Summary (Last 24 hours) at 12/04/12 0800 Last data filed at 12/04/12 0600  Gross per 24 hour  Intake  692.8 ml  Output   3125 ml  Net -2432.2 ml    Physical Exam: General appearance: alert, cooperative, no distress and chronically ill appearing Lungs: rhonchi on Rt, decreased breath sounds on Lt  Heart: regular rate and rhythm   Rate: 80  Rhythm: normal sinus rhythm. PACs, PVCs. One run of 6bts NSVT.  Lab Results:  Recent Labs  12/02/12 1939 12/02/12 2347  WBC 14.0* 13.8*  HGB 10.5* 9.7*  PLT 315 281    Recent Labs  12/03/12 0525 12/04/12 0544  NA 116* 132*  K 3.7 2.7*  CL 72* 85*  CO2 31 36*  GLUCOSE 81 92  BUN 23 28*  CREATININE 1.37* 1.61*   No results found for this basename: TROPONINI, CK, MB,  in the last 72 hours Hepatic Function Panel  Recent Labs  12/02/12 2347  PROT 6.8  ALBUMIN 3.4*  AST 34  ALT 27  ALKPHOS 123*  BILITOT 0.3   No results found for this basename: CHOL,  in the last 72 hours  Recent Labs  12/02/12 2347  INR 1.32    Imaging:  Dg Chest Port 1 View  12/04/2012   *RADIOLOGY REPORT*  Clinical Data: Respiratory failure.  PORTABLE CHEST - 1 VIEW  Comparison: 12/02/2012  Findings: Increased densities in the left mid and lower lung region may represent asymmetric edema and pleural fluid.  There is improved aeration in the right lung.  The heart size appears to be upper limits of normal.  The thoracic aorta is heavily calcified. Chronic abnormality in the lateral left clavicle.  Elevated right humeral  head suggests rotator cuff disease.  IMPRESSION: Increased densities in the left mid and lower lung region probably represent a combination of pleural fluid and atelectasis.  Cannot exclude asymmetric edema.   Original Report Authenticated By: Richarda Overlie, M.D.    Cardiac Studies:  Assessment/Plan:   Principal Problem:   Acute on chronic combined systolic and diastolic congestive heart failure- EF of 30-35% via echo in 4/14  Active Problems:   Acute and chronic respiratory failure   COPD (chronic obstructive pulmonary disease)   Altered mental status- Improved   COPD exacerbation   CAD (coronary artery disease)   Chronic renal disease, stage III- SCr 1.6   Tobacco abuse, pt quit after MI   PVD, Lt Ax-fem and fem-fem BPG 2/12, Dr Manson Passey follows   HTN (hypertension)   Dyslipidemia   History of CVA (cerebrovascular accident)   PAF, post MI 9/13   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 Increase daily K+. Consider decreasing Lasix. Consider low dose beta blocker. ?Continue Tolvaptan.   Corine Shelter PA-C Beeper [phone] 12/04/2012, 8:00 AM   I have seen and examined  the patient along with Corine Shelter PA-C.  I have reviewed the chart, notes and new data.  I agree with PA's note.  Key new complaints: remarkably complete neurological recovery - alert and fully oriented, amnestic for last couple of days Key examination changes: clear lungs but with reduced breath sounds and egophony on left base (c/w effusion), JVP lower, no edema Key new findings / data: severe hypokalemia, virtually normal Na, creat going back up, TSH >60  PLAN: Replace K Slow down diuretics Fluid restriction May be able to DC samsca in 24 hours Leave on milrinone only another 24 hours Note severely elevated TSH despite what appears to be an appropriate  L-T4 dose. While there may be a component of sick euthyroid sd. and he has had very recent thyroid surgery, this suggests insufficient hormone supplementation. Hypothyroidism might be contributing to hyponatremia.  Thurmon Fair, MD, [facility] [phone] 12/04/2012, 9:12 AM

## 2012-12-04 NOTE — Progress Notes (Signed)
CRITICAL VALUE ALERT  Critical value received:  K 2.7  Date of notification:  12/04/2012  Time of notification:  0640  Critical value read back: Yes  Nurse who received alert:  [place] Makaiyah Schweiger   MD notified (1st page):  Dr. Darrick Penna  Time of first page:  [phone]

## 2012-12-05 ENCOUNTER — Inpatient Hospital Stay (HOSPITAL_COMMUNITY): Payer: Medicare Other

## 2012-12-05 DIAGNOSIS — J4489 Other specified chronic obstructive pulmonary disease: Secondary | ICD-10-CM

## 2012-12-05 DIAGNOSIS — I70219 Atherosclerosis of native arteries of extremities with intermittent claudication, unspecified extremity: Secondary | ICD-10-CM

## 2012-12-05 DIAGNOSIS — N183 Chronic kidney disease, stage 3 unspecified: Secondary | ICD-10-CM

## 2012-12-05 DIAGNOSIS — J449 Chronic obstructive pulmonary disease, unspecified: Secondary | ICD-10-CM

## 2012-12-05 LAB — CBC
HCT: 35.6 % — ABNORMAL LOW (ref 39.0–52.0)
Hemoglobin: 11.5 g/dL — ABNORMAL LOW (ref 13.0–17.0)
MCH: 27.4 pg (ref 26.0–34.0)
MCHC: 32.3 g/dL (ref 30.0–36.0)
MCV: 85 fL (ref 78.0–100.0)
Platelets: 378 10*3/uL (ref 150–400)
RBC: 4.19 MIL/uL — ABNORMAL LOW (ref 4.22–5.81)
RDW: 15.1 % (ref 11.5–15.5)
WBC: 23 10*3/uL — ABNORMAL HIGH (ref 4.0–10.5)

## 2012-12-05 LAB — BASIC METABOLIC PANEL
BUN: 27 mg/dL — ABNORMAL HIGH (ref 6–23)
CO2: 34 mEq/L — ABNORMAL HIGH (ref 19–32)
Calcium: 8.9 mg/dL (ref 8.4–10.5)
Creatinine, Ser: 1.7 mg/dL — ABNORMAL HIGH (ref 0.50–1.35)

## 2012-12-05 MED ORDER — HYDRALAZINE HCL 20 MG/ML IJ SOLN
10.0000 mg | Freq: Four times a day (QID) | INTRAMUSCULAR | Status: DC | PRN
  Administered 2012-12-05: 10 mg via INTRAVENOUS
  Filled 2012-12-05: qty 1

## 2012-12-05 MED ORDER — HYDRALAZINE HCL 20 MG/ML IJ SOLN
INTRAMUSCULAR | Status: AC
  Filled 2012-12-05: qty 1

## 2012-12-05 NOTE — Progress Notes (Signed)
Subjective:  Awake and alert, up to chair yesterday.  Objective:  Vital Signs in the last 24 hours: Temp:  [97.5 F (36.4 C)-98.2 F (36.8 C)] 98 F (36.7 C) (06/16 0400) Pulse Rate:  [75-77] 77 (06/16 0700) Resp:  [15-29] 24 (06/16 0700) BP: (137-196)/(54-154) 164/63 mmHg (06/16 0700) SpO2:  [91 %-100 %] 98 % (06/16 0700) Weight:  [55.8 kg (123 lb 0.3 oz)] 55.8 kg (123 lb 0.3 oz) (06/16 0455)  Intake/Output from previous day:  Intake/Output Summary (Last 24 hours) at 12/05/12 0801 Last data filed at 12/05/12 0700  Gross per 24 hour  Intake  558.2 ml  Output   1225 ml  Net -666.8 ml    Physical Exam: General appearance: alert, cooperative and no distress Lungs: decreased breath sounds bilat with rhonchi Rt base Heart: regular rate and rhythm   Rate: 78  Rhythm: normal sinus rhythm and PACs  and a run of PAT- 6bts  Lab Results:  Recent Labs  12/02/12 2347 12/05/12 0520  WBC 13.8* 23.0*  HGB 9.7* 11.5*  PLT 281 378    Recent Labs  12/04/12 0544 12/04/12 1448 12/05/12 0520  NA 132*  --  140  K 2.7* 3.7 3.9  CL 85*  --  96  CO2 36*  --  34*  GLUCOSE 92  --  122*  BUN 28*  --  27*  CREATININE 1.61*  --  1.70*   No results found for this basename: TROPONINI, CK, MB,  in the last 72 hours Hepatic Function Panel  Recent Labs  12/02/12 2347  PROT 6.8  ALBUMIN 3.4*  AST 34  ALT 27  ALKPHOS 123*  BILITOT 0.3   No results found for this basename: CHOL,  in the last 72 hours  Recent Labs  12/02/12 2347  INR 1.32    Imaging: Imaging results have been reviewed- todays CXR pending  Cardiac Studies:  Assessment/Plan:   Principal Problem:   Acute on chronic combined systolic and diastolic congestive heart failure- EF of 30-35% via echo in 4/14  Active Problems:   Acute and chronic respiratory failure   COPD (chronic obstructive pulmonary disease)   Altered mental status   COPD exacerbation- WBC up to 23k today, he is not on steroids or ABs,  CXR pending   CAD (coronary artery disease)   Chronic renal disease, stage III-SCr drifting up 1.7 today   Tobacco abuse, pt quit after MI   PVD, Lt Ax-fem and fem-fem BPG 2/12, Dr Manson Passey follows   HTN (hypertension)   Dyslipidemia   History of CVA (cerebrovascular accident)   PAF, post MI 9/13   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 Tx to stepdown. CCM is following, BNP still 30k. Continue IV Lasix and Milrinone today.  Corine Shelter PA-C Beeper [phone] 12/05/2012, 8:01 AM   Agree with note written by Corine Shelter PAC  Markedly improved. Lungs clear. Cor RRR. No periph edema. BNP coming down but SCr increasing. Metabolically improved. Still on Milrinone and getting IV lasix. Can prob stop milrinone tomorrow and switch to po diuretics. Move to step down tomorrow.   Runell Gess 12/05/2012 4:04 PM

## 2012-12-05 NOTE — Progress Notes (Signed)
UR Completed.  [name] [name] [phone] 12/05/2012  

## 2012-12-05 NOTE — Progress Notes (Signed)
PULMONARY  / CRITICAL CARE MEDICINE  Name: [name] [name] MRN: [id number] DOB: [birth year]    ADMISSION DATE:  12/02/2012 CONSULTATION DATE:  6/14  REFERRING MD :  Croitoru PRIMARY SERVICE: [facility] Reason: acute resp failure  BRIEF PATIENT DESCRIPTION: 68M with ischemic CM, thyroid Ca and COPD admitted with AMS and acute resp failure, CHF pattern on CXR, wheezing and severe hyponatremia  SIGNIFICANT EVENTS / STUDIES:  6/13 NM whole body scan: Radioiodine uptake at two foci in the right cervical region which may be related to thyroid remnant or lymph nodes. No distant sites of metastasis are definitely identified   LINES / TUBES:   CULTURES: MRSA PCR 6/13 >> NEG  ANTIBIOTICS:   SUBJECTIVE:  Cognition much improved. oob to chair having breakfast  No distress. Denies dyspnea. No new complaints.    VITAL SIGNS: Temp:  [97.5 F (36.4 C)-98.2 F (36.8 C)] 97.8 F (36.6 C) (06/16 0820) Pulse Rate:  [75-77] 77 (06/16 0700) Resp:  [15-29] 24 (06/16 0700) BP: (148-196)/(54-154) 164/63 mmHg (06/16 0700) SpO2:  [93 %-100 %] 98 % (06/16 0820) Weight:  [55.8 kg (123 lb 0.3 oz)] 55.8 kg (123 lb 0.3 oz) (06/16 0455) HEMODYNAMICS:   VENTILATOR SETTINGS:   INTAKE / OUTPUT: Intake/Output     06/15 0701 - 06/16 0700 06/16 0701 - 06/17 0700   P.O. 520 120   I.V. (mL/kg) 410.6 (7.4) 12.4 (0.2)   Total Intake(mL/kg) 930.6 (16.7) 132.4 (2.4)   Urine (mL/kg/hr) 1225 (0.9)    Total Output 1225     Net -294.4 +132.4        Urine Occurrence 4 x      PHYSICAL EXAMINATION: General:  RASS 0. NAD Neuro:  intact HEENT: hoarse voice quality, otherwise WNL Cardiovascular:  RRR s M Lungs: no crackles, wheezes resolved Abdomen:  Soft, NT, NABS Ext: warm, no edema  LABS:  Recent Labs Lab 12/02/12 1939 12/02/12 2347 12/03/12 0525 12/03/12 0830 12/04/12 0544 12/04/12 1448 12/05/12 0520  HGB 10.5* 9.7*  --   --   --   --  11.5*  WBC 14.0* 13.8*  --   --   --   --  23.0*  PLT 315  281  --   --   --   --  378  NA 109* 112* 116*  --  132*  --  140  K 5.4* 4.3 3.7  --  2.7* 3.7 3.9  CL 68* 69* 72*  --  85*  --  96  CO2 29 32 31  --  36*  --  34*  GLUCOSE 97 97 81  --  92  --  122*  BUN 23 24* 23  --  28*  --  27*  CREATININE 1.45* 1.50* 1.37*  --  1.61*  --  1.70*  CALCIUM 8.9 8.2* 7.8*  --  8.2*  --  8.9  MG  --  1.8  --   --   --   --   --   AST  --  34  --   --   --   --   --   ALT  --  27  --   --   --   --   --   ALKPHOS  --  123*  --   --   --   --   --   BILITOT  --  0.3  --   --   --   --   --  PROT  --  6.8  --   --   --   --   --   ALBUMIN  --  3.4*  --   --   --   --   --   INR  --  1.32  --   --   --   --   --   PROBNP 64029.0*  --   --   --   --   --  30416.0*  PHART  --   --   --  7.415  --   --   --   PCO2ART  --   --   --  58.2*  --   --   --   PO2ART  --   --   --  66.0*  --   --   --    No results found for this basename: GLUCAP,  in the last 168 hours  CXR: 6/16 improved interstitial edema, persistent L effusion vs infitrate  ASSESSMENT / PLAN:  PULMONARY A: Acute on chronic resp failure, much improved, compensated hypercarbixa on ABG Pulmonary edema, clinically and radiographically improved Smoker COPD with resolved bronchospasm Suspected L effusion of unclear etiology -can follow as outpt, too small to tap at this time P:   Cont supplemental O2 Cont nebulized steroids and BDs   CARDIOVASCULAR A: CAD CHF, systolic and diastolic PVD P:  Mgmt per Cards  RENAL A:  CKD Severe hyponatremia - much improved, rather rapidly Metabolic alkalosis P:   Cont free water restrict Monitor chemistries  GASTROINTESTINAL A:  No issues P:   Advance diet  HEMATOLOGIC/ONC A:  Thyroid Ca Anemia P:  Monitor CBC  INFECTIOUS A:  No issues. Doubt Xray findings represent PNA given absence of clinical findings to support this dx P:   Monitor off abx   ENDOCRINE A:  S/p thyroidectomy Markedly elevated TSH c/w noncompliance It is  possible that he was instructed to hold L thyroxine for scan performed 6/13. He is uncertain P:   Cont levothyroxine  NEUROLOGIC A:  Acute encephalopathy due to hyponatremia, resolved P:   Monitor as corrected  PCCM to sign off  Cyril Mourning MD. Tonny Bollman. [place] [facility] Pager [phone] If no response call [phone]    12/05/2012, 10:01 AM

## 2012-12-06 DIAGNOSIS — D72829 Elevated white blood cell count, unspecified: Secondary | ICD-10-CM | POA: Diagnosis not present

## 2012-12-06 LAB — CBC
HCT: 35.3 % — ABNORMAL LOW (ref 39.0–52.0)
Hemoglobin: 11.6 g/dL — ABNORMAL LOW (ref 13.0–17.0)
MCH: 27.8 pg (ref 26.0–34.0)
MCHC: 32.9 g/dL (ref 30.0–36.0)
MCV: 84.7 fL (ref 78.0–100.0)
Platelets: 392 10*3/uL (ref 150–400)
RBC: 4.17 MIL/uL — ABNORMAL LOW (ref 4.22–5.81)
RDW: 15.3 % (ref 11.5–15.5)
WBC: 18.6 10*3/uL — ABNORMAL HIGH (ref 4.0–10.5)

## 2012-12-06 LAB — BASIC METABOLIC PANEL
BUN: 30 mg/dL — ABNORMAL HIGH (ref 6–23)
CO2: 34 mEq/L — ABNORMAL HIGH (ref 19–32)
Calcium: 8.6 mg/dL (ref 8.4–10.5)
Chloride: 92 mEq/L — ABNORMAL LOW (ref 96–112)
Creatinine, Ser: 1.5 mg/dL — ABNORMAL HIGH (ref 0.50–1.35)
GFR calc Af Amer: 52 mL/min — ABNORMAL LOW (ref >90)
GFR calc non Af Amer: 45 mL/min — ABNORMAL LOW (ref >90)
Glucose, Bld: 148 mg/dL — ABNORMAL HIGH (ref 70–99)
Potassium: 3.6 mEq/L (ref 3.5–5.1)
Sodium: 137 mEq/L (ref 135–145)

## 2012-12-06 MED ORDER — ISOSORBIDE MONONITRATE ER 30 MG PO TB24
30.0000 mg | ORAL_TABLET | Freq: Every day | ORAL | Status: DC
  Administered 2012-12-06 – 2012-12-09 (×4): 30 mg via ORAL
  Filled 2012-12-06 (×7): qty 1

## 2012-12-06 MED ORDER — FUROSEMIDE 40 MG PO TABS
40.0000 mg | ORAL_TABLET | Freq: Two times a day (BID) | ORAL | Status: DC
  Administered 2012-12-06 – 2012-12-09 (×8): 40 mg via ORAL
  Filled 2012-12-06 (×11): qty 1

## 2012-12-06 MED ORDER — HYDRALAZINE HCL 25 MG PO TABS
25.0000 mg | ORAL_TABLET | Freq: Three times a day (TID) | ORAL | Status: DC
  Administered 2012-12-06 – 2012-12-10 (×12): 25 mg via ORAL
  Filled 2012-12-06 (×20): qty 1

## 2012-12-06 MED ORDER — AMLODIPINE BESYLATE 5 MG PO TABS: 5.0000 mg | ORAL_TABLET | Freq: Every day | ORAL | Status: DC

## 2012-12-06 NOTE — Progress Notes (Signed)
Chart review complete.  Patient is not eligible for [facility] Care Management services because his/her PCP is not a [facility] primary care provider or is not [facility] affiliated.  For any additional questions or new referrals please contact Tim Henderson BSN RN [facility] Liaison at [phone] °

## 2012-12-06 NOTE — Progress Notes (Signed)
1200 came in fron 2900 via wheelchair . Orientation done to room and unit . Lunch served

## 2012-12-06 NOTE — Progress Notes (Signed)
Patient transferred to room 4714 with belongings, chart, and meds. Traci (patient's daughter) notified of patients transfer. Report given to receiving nurse, Pattricia Boss. Pt denies pain or complaints at this time.   Dawson Bills, RN

## 2012-12-06 NOTE — Progress Notes (Signed)
Subjective:  Up in chair, alert, no complaints  Objective:  Vital Signs in the last 24 hours: Temp:  [97.3 F (36.3 C)-98.4 F (36.9 C)] 97.8 F (36.6 C) (06/17 0736) Pulse Rate:  [76-81] 76 (06/16 1603) Resp:  [16-21] 18 (06/17 0600) BP: (155-170)/(37-85) 155/48 mmHg (06/17 0600) SpO2:  [98 %-100 %] 99 % (06/17 0600)  Intake/Output from previous day:  Intake/Output Summary (Last 24 hours) at 12/06/12 0756 Last data filed at 12/06/12 0737  Gross per 24 hour  Intake  657.6 ml  Output    925 ml  Net -267.4 ml    Physical Exam: General appearance: alert, cooperative, no distress and chronically ill appearing Lungs: decreased breath sounds Heart: regular rate and rhythm Extrem: Withou edema   Rate: 78  Rhythm: normal sinus rhythm and frequent ectopic beats and short runs of atrial tachycardia  Lab Results:  Recent Labs  12/05/12 0520  WBC 23.0*  HGB 11.5*  PLT 378    Recent Labs  12/04/12 0544 12/04/12 1448 12/05/12 0520  NA 132*  --  140  K 2.7* 3.7 3.9  CL 85*  --  96  CO2 36*  --  34*  GLUCOSE 92  --  122*  BUN 28*  --  27*  CREATININE 1.61*  --  1.70*   No results found for this basename: TROPONINI, CK, MB,  in the last 72 hours Hepatic Function Panel No results found for this basename: PROT, ALBUMIN, AST, ALT, ALKPHOS, BILITOT, BILIDIR, IBILI,  in the last 72 hours No results found for this basename: CHOL,  in the last 72 hours No results found for this basename: INR,  in the last 72 hours  Imaging: Imaging results have been reviewed  Cardiac Studies:  Assessment/Plan:   Principal Problem:   Acute on chronic combined systolic and diastolic congestive heart failure- EF of 30-35% via echo in 4/14  Active Problems:   Acute and chronic respiratory failure- improved   COPD-Severe   Altered mental status-resolved   COPD exacerbation- improved   CAD - unsuccessful RCA PCI Sept 2013   Chronic renal disease, stage III   Leukocytosis- WBC 23K  yesterday, no fever, not on ABs   Tobacco abuse, pt quit after MI   PVD, Lt Ax-fem and fem-fem BPG 2/12   HTN (hypertension)-elevated B/P last night   Dyslipidemia   History of CVA    PAF, post MI 9/13   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 Stop Milrinone. Change Lasix to po. Add Hydralazine 25mg  TID and po nitrate, add Amlodipine if needed for B/P in a few days. Tx telemetry as long as his labs are OK.  Corine Shelter PA-C Beeper [phone] 12/06/2012, 7:56 AM   I have seen and examined the patient along with Corine Shelter PA-C.  I have reviewed the chart, notes and new data.  I agree with PA's note.  Key new complaints: cough, productive of "junk" Key examination changes: rhonchi scattered through both lungs, but no moist rales or signs of consolidation Key new findings / data: matched in/out, electrolytes are normal, repeat WBC pending  PLAN: DC milrinone. Transfer to telemetry. Antibiotics if WBC still high (empirical for acute exacerbation of COPD).  Thurmon Fair, MD, [facility] [phone] 12/06/2012, 8:17 AM

## 2012-12-07 ENCOUNTER — Inpatient Hospital Stay (HOSPITAL_COMMUNITY): Payer: Medicare Other

## 2012-12-07 DIAGNOSIS — C73 Malignant neoplasm of thyroid gland: Secondary | ICD-10-CM

## 2012-12-07 LAB — BASIC METABOLIC PANEL
BUN: 30 mg/dL — ABNORMAL HIGH (ref 6–23)
CO2: 33 mEq/L — ABNORMAL HIGH (ref 19–32)
Calcium: 8.4 mg/dL (ref 8.4–10.5)
Chloride: 90 mEq/L — ABNORMAL LOW (ref 96–112)
Creatinine, Ser: 1.47 mg/dL — ABNORMAL HIGH (ref 0.50–1.35)
GFR calc Af Amer: 54 mL/min — ABNORMAL LOW (ref >90)
GFR calc non Af Amer: 46 mL/min — ABNORMAL LOW (ref >90)
Glucose, Bld: 99 mg/dL (ref 70–99)
Potassium: 4.1 mEq/L (ref 3.5–5.1)
Sodium: 132 mEq/L — ABNORMAL LOW (ref 135–145)

## 2012-12-07 LAB — CBC
HCT: 30.8 % — ABNORMAL LOW (ref 39.0–52.0)
Hemoglobin: 10.1 g/dL — ABNORMAL LOW (ref 13.0–17.0)
MCH: 27.6 pg (ref 26.0–34.0)
MCHC: 32.8 g/dL (ref 30.0–36.0)
MCV: 84.2 fL (ref 78.0–100.0)
Platelets: 331 10*3/uL (ref 150–400)
RBC: 3.66 MIL/uL — ABNORMAL LOW (ref 4.22–5.81)
RDW: 15.5 % (ref 11.5–15.5)
WBC: 14 10*3/uL — ABNORMAL HIGH (ref 4.0–10.5)

## 2012-12-07 MED ORDER — OXYCODONE HCL 5 MG PO TABS
5.0000 mg | ORAL_TABLET | Freq: Four times a day (QID) | ORAL | Status: DC | PRN
  Administered 2012-12-07 – 2012-12-09 (×5): 5 mg via ORAL
  Filled 2012-12-07 (×6): qty 1

## 2012-12-07 NOTE — Care Management Note (Signed)
    Page 1 of 2   12/07/2012     11:55:20 AM   CARE MANAGEMENT NOTE 12/07/2012  Patient:  MARCELINO, CAMPOS   Account Number:  [id number]  Date Initiated:  12/04/2012  Documentation initiated by:  [facility]  Subjective/Objective Assessment:   Acute and chronic respiratory failure  COPD (chronic obstructive pulmonary disease)  CHF     Action/Plan:   HH needs   Anticipated DC Date:  12/07/2012   Anticipated DC Plan:  HOME W HOME HEALTH SERVICES      DC Planning Services  CM consult      Choice offered to / List presented to:  C-1 Patient        HH arranged  HH-1 RN  HH-10 DISEASE MANAGEMENT      HH agency  [facility]   Status of service:  In process, will continue to follow Medicare Important Message given?   (If response is "NO", the following Medicare IM given date fields will be blank) Date Medicare IM given:   Date Additional Medicare IM given:    Discharge Disposition:    Per UR Regulation:  Reviewed for med. necessity/level of care/duration of stay  If discussed at Long Length of Stay Meetings, dates discussed:   12/08/2012    Comments:  Contact:  Deal,Traci Daughter [phone]  12/07/12.Marland KitchenMarland KitchenSpoke with pt at bedside regarding discharge planning.  Offered pt list of Home Health Agencies.  Pt chose [facility] to render services.  Kizzie Furnish of [facility] notified.  No DME needs identified at this time.  12-06-12 8:25am Avie Arenas, RNBSN - [phone] DCing milrinone today - tx to acute unit.

## 2012-12-07 NOTE — Progress Notes (Signed)
Pharmacist Heart Failure Core Measure Documentation  Assessment: [name] [name] has an EF documented as 30% on 10/17/12 by echo.  Rationale: Heart failure patients with left ventricular systolic dysfunction (LVSD) and an EF < 40% should be prescribed an angiotensin converting enzyme inhibitor (ACEI) or angiotensin receptor blocker (ARB) at discharge unless a contraindication is documented in the medical record.  This patient is not currently on an ACEI or ARB for HF.  This note is being placed in the record in order to provide documentation that a contraindication to the use of these agents is present for this encounter.  ACE Inhibitor or Angiotensin Receptor Blocker is contraindicated (specify all that apply)  []   ACEI allergy AND ARB allergy []   Angioedema []   Moderate or severe aortic stenosis []   Hyperkalemia []   Hypotension []   Renal artery stenosis [x]   Worsening renal function, preexisting renal disease or dysfunction   [name] [name] 12/07/2012 2:48 PM

## 2012-12-07 NOTE — Progress Notes (Signed)
Pt. Seen and examined. Agree with the NP/PA-C note as written.  Breathing is better, still producing a thick tan mucous.  I suspect he has a pneumonia or bronchopneumonia. Exam reveals consolidation in the RML with tactile fremitus and egophony. There is also dullness at the left base with a known effusion.  He has had tylenol without relief of his pain. Will re-check CXR. Add additional pain medication. Leukocytosis is improving with antibiotics. Sodium is drifting down again, possibly toward baseline hyponatremia - will not reinstitute samsca at this time. Follow labs closely.  Chrystie Nose, MD, [facility] Attending Cardiologist [facility]

## 2012-12-07 NOTE — Progress Notes (Signed)
Subjective: Feeling better.  Productive cough is worse than usual.  Also complaining of new pain in the right side. Mid axillary line.  Objective: Vital signs in last 24 hours: Temp:  [97.9 F (36.6 C)-98.3 F (36.8 C)] 98 F (36.7 C) (06/18 0554) Pulse Rate:  [65-88] 73 (06/18 0851) Resp:  [17-18] 18 (06/18 0133) BP: (136-160)/(63-82) 148/72 mmHg (06/18 0554) SpO2:  [93 %-100 %] 100 % (06/18 0851) Weight:  [124 lb 3.2 oz (56.337 kg)-125 lb 3.2 oz (56.79 kg)] 125 lb 3.2 oz (56.79 kg) (06/18 0634) Last BM Date: 12/06/12  Intake/Output from previous day: 06/17 0701 - 06/18 0700 In: 840 [P.O.:840] Out: 601 [Urine:600; Stool:1] Intake/Output this shift: Total I/O In: 240 [P.O.:240] Out: -   Medications Current Facility-Administered Medications  Medication Dose Route Frequency Provider Last Rate Last Dose  . 0.9 %  sodium chloride infusion  250 mL Intravenous PRN Rollene Rotunda, MD 10 mL/hr at 12/05/12 0105 250 mL at 12/05/12 0105  . acetaminophen (TYLENOL) tablet 650 mg  650 mg Oral Q4H PRN Rollene Rotunda, MD   650 mg at 12/07/12 0905  . albuterol (PROVENTIL) (5 MG/ML) 0.5% nebulizer solution 2.5 mg  2.5 mg Nebulization Q6H Merwyn Katos, MD   2.5 mg at 12/07/12 0830  . albuterol (PROVENTIL) (5 MG/ML) 0.5% nebulizer solution 2.5 mg  2.5 mg Nebulization Q3H PRN Merwyn Katos, MD      . aspirin EC tablet 81 mg  81 mg Oral Daily Rollene Rotunda, MD   81 mg at 12/06/12 0916  . budesonide (PULMICORT) nebulizer solution 0.25 mg  0.25 mg Nebulization Q6H Merwyn Katos, MD   0.25 mg at 12/07/12 0830  . calcium carbonate (OS-CAL - dosed in mg of elemental calcium) tablet 500 mg of elemental calcium  1 tablet Oral TID AC & HS Rollene Rotunda, MD   500 mg of elemental calcium at 12/07/12 0856  . clopidogrel (PLAVIX) tablet 75 mg  75 mg Oral Q breakfast Rollene Rotunda, MD   75 mg at 12/07/12 0857  . feeding supplement (ENSURE COMPLETE) liquid 237 mL  237 mL Oral BID BM Lavena Bullion,  RD   237 mL at 12/06/12 1821  . furosemide (LASIX) tablet 40 mg  40 mg Oral BID Abelino Derrick, PA-C   40 mg at 12/07/12 0858  . heparin injection 5,000 Units  5,000 Units Subcutaneous Q8H Rollene Rotunda, MD   5,000 Units at 12/07/12 0554  . hydrALAZINE (APRESOLINE) injection 10 mg  10 mg Intravenous Q6H PRN Abelino Derrick, PA-C   10 mg at 12/05/12 1930  . hydrALAZINE (APRESOLINE) tablet 25 mg  25 mg Oral [address] [place], PA-C   25 mg at 12/07/12 0554  . ipratropium (ATROVENT) nebulizer solution 0.5 mg  0.5 mg Nebulization Q6H Merwyn Katos, MD   0.5 mg at 12/07/12 0830  . isosorbide mononitrate (IMDUR) 24 hr tablet 30 mg  30 mg Oral Daily Abelino Derrick, PA-C   30 mg at 12/07/12 0857  . levothyroxine (SYNTHROID, LEVOTHROID) tablet 100 mcg  100 mcg Oral QAC breakfast Rollene Rotunda, MD   100 mcg at 12/06/12 0917  . metoprolol succinate (TOPROL-XL) 24 hr tablet 25 mg  25 mg Oral Daily Rollene Rotunda, MD   25 mg at 12/06/12 0917  . nitroGLYCERIN (NITROSTAT) SL tablet 0.4 mg  0.4 mg Sublingual Q5 Min x 3 PRN Rollene Rotunda, MD      . ondansetron [facility]) injection 4  mg  4 mg Intravenous Q6H PRN Rollene Rotunda, MD      . pantoprazole (PROTONIX) EC tablet 40 mg  40 mg Oral Q1200 Rollene Rotunda, MD   40 mg at 12/06/12 1821  . potassium chloride SA (K-DUR,KLOR-CON) CR tablet 20 mEq  20 mEq Oral BID Abelino Derrick, PA-C   20 mEq at 12/06/12 2119  . ranolazine (RANEXA) 12 hr tablet 500 mg  500 mg Oral BID Rollene Rotunda, MD   500 mg at 12/06/12 2120  . RESOURCE THICKENUP CLEAR   Oral PRN Runell Gess, MD      . sodium chloride 0.9 % injection 3 mL  3 mL Intravenous Q12H Rollene Rotunda, MD   3 mL at 12/06/12 2120  . sodium chloride 0.9 % injection 3 mL  3 mL Intravenous PRN Rollene Rotunda, MD   3 mL at 12/04/12 1035    PE: General appearance: alert, cooperative and no distress Lungs: Course crackles at the right base. Heart: regular rate and rhythm, S1, S2 normal, no murmur, click, rub or  gallop Extremities: No LEE Pulses: Radials 2+ and symmetric, 1+ distal pulses. Neurologic: Grossly normal  Lab Results:   Recent Labs  12/05/12 0520 12/06/12 0930 12/07/12 0520  WBC 23.0* 18.6* 14.0*  HGB 11.5* 11.6* 10.1*  HCT 35.6* 35.3* 30.8*  PLT 378 392 331   BMET  Recent Labs  12/05/12 0520 12/06/12 0930 12/07/12 0520  NA 140 137 132*  K 3.9 3.6 4.1  CL 96 92* 90*  CO2 34* 34* 33*  GLUCOSE 122* 148* 99  BUN 27* 30* 30*  CREATININE 1.70* 1.50* 1.47*  CALCIUM 8.9 8.6 8.4    Assessment/Plan   Principal Problem:   Acute on chronic combined systolic and diastolic congestive heart failure- EF of 30-35% via echo in 4/14 Active Problems:   Tobacco abuse, pt quit after MI   COPD (chronic obstructive pulmonary disease)   Altered mental status   PVD, Lt Ax-fem and fem-fem BPG 2/12   HTN (hypertension)   Dyslipidemia   History of CVA (cerebrovascular accident)   PAF, post MI 9/13   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  Net fluids:  +0.239L/-5.0L.  SCr improved.  HGB decreased from 11.6 to 10.1.  No melena or hematochezia in yesterdays BM.  Continue to monitor.  Check stool guaiac.   WBCs decreasing steadily.  Bronchitis exacerbation.  ? Abx.  Pain on right chest improved with tylenol and slightly worse with inspiration.   Not ready for DC yet.  Needs to ambulate today.    LOS: 5 days    [name] [name] 12/07/2012 9:27 AM

## 2012-12-08 DIAGNOSIS — J189 Pneumonia, unspecified organism: Secondary | ICD-10-CM | POA: Diagnosis not present

## 2012-12-08 DIAGNOSIS — R1310 Dysphagia, unspecified: Secondary | ICD-10-CM | POA: Diagnosis present

## 2012-12-08 DIAGNOSIS — Y95 Nosocomial condition: Secondary | ICD-10-CM | POA: Diagnosis not present

## 2012-12-08 LAB — COMPREHENSIVE METABOLIC PANEL
AST: 23 U/L (ref 0–37)
Albumin: 3.2 g/dL — ABNORMAL LOW (ref 3.5–5.2)
Alkaline Phosphatase: 79 U/L (ref 39–117)
BUN: 24 mg/dL — ABNORMAL HIGH (ref 6–23)
CO2: 32 mEq/L (ref 19–32)
Chloride: 89 mEq/L — ABNORMAL LOW (ref 96–112)
Creatinine, Ser: 1.34 mg/dL (ref 0.50–1.35)
GFR calc non Af Amer: 52 mL/min — ABNORMAL LOW (ref >90)
Potassium: 4.3 mEq/L (ref 3.5–5.1)
Total Bilirubin: 0.3 mg/dL (ref 0.3–1.2)

## 2012-12-08 LAB — CBC
HCT: 29.1 % — ABNORMAL LOW (ref 39.0–52.0)
MCV: 82 fL (ref 78.0–100.0)
Platelets: 345 10*3/uL (ref 150–400)
RBC: 3.55 MIL/uL — ABNORMAL LOW (ref 4.22–5.81)
RDW: 15.4 % (ref 11.5–15.5)
WBC: 11.6 10*3/uL — ABNORMAL HIGH (ref 4.0–10.5)

## 2012-12-08 MED ORDER — ENSURE PUDDING PO PUDG
1.0000 | Freq: Two times a day (BID) | ORAL | Status: DC
  Administered 2012-12-08 – 2012-12-09 (×4): 1 via ORAL

## 2012-12-08 MED ORDER — ENSURE COMPLETE PO LIQD
237.0000 mL | ORAL | Status: DC
  Administered 2012-12-09: 237 mL via ORAL

## 2012-12-08 NOTE — Progress Notes (Signed)
Subjective:  No SOB. He still has productive cough, but is sitting in room without O2, & ambulating without O2.  Objective:  Vital Signs in the last 24 hours: Temp:  [97.8 F (36.6 C)-98.3 F (36.8 C)] 97.8 F (36.6 C) (06/19 0542) Pulse Rate:  [75-84] 84 (06/19 0542) Resp:  [17-20] 17 (06/19 0542) BP: (138-157)/(59-75) 157/59 mmHg (06/19 0542) SpO2:  [90 %-99 %] 94 % (06/19 0747) Weight:  [57.199 kg (126 lb 1.6 oz)-57.244 kg (126 lb 3.2 oz)] 57.199 kg (126 lb 1.6 oz) (06/19 0851)  Intake/Output from previous day:  Intake/Output Summary (Last 24 hours) at 12/08/12 1012 Last data filed at 12/08/12 0543  Gross per 24 hour  Intake   1223 ml  Output    400 ml  Net    823 ml    Physical Exam: General appearance: alert, cooperative and no distress Lungs: decreased breath sounds, with coarse interstitial lung sounds. Heart: regular rate and rhythm, Nl S1 S2, no M/R/G Extrem: no edema Thin, frail, borderline jaundiced    Rate: 82  Rhythm: normal sinus rhythm  Lab Results:  Recent Labs  12/06/12 0930 12/07/12 0520  WBC 18.6* 14.0*  HGB 11.6* 10.1*  PLT 392 331    Recent Labs  12/06/12 0930 12/07/12 0520  NA 137 132*  K 3.6 4.1  CL 92* 90*  CO2 34* 33*  GLUCOSE 148* 99  BUN 30* 30*  CREATININE 1.50* 1.47*   No results found for this basename: TROPONINI, CK, MB,  in the last 72 hours Hepatic Function Panel No results found for this basename: PROT, ALBUMIN, AST, ALT, ALKPHOS, BILITOT, BILIDIR, IBILI,  in the last 72 hours No results found for this basename: CHOL,  in the last 72 hours No results found for this basename: INR,  in the last 72 hours  Imaging: Imaging results have been reviewed  Cardiac Studies:  Assessment/Plan:   Principal Problem:   Acute on chronic combined systolic and diastolic congestive heart failure- EF of 30-35% via echo in 4/14 Active Problems:   Acute and chronic respiratory failure   COPD (chronic obstructive pulmonary  disease)   COPD exacerbation   CAD, unsuccessful RCA PCI Sept 2013   Chronic renal disease, stage III   Nosocomial pneumonia   Dysphagia, post thyroid surgery, possible aspiration pneumonia   Tobacco abuse, pt quit after MI   Altered mental status   PVD, Lt Ax-fem and fem-fem BPG 2/12   HTN (hypertension)   Dyslipidemia   History of CVA (cerebrovascular accident)   PAF, post MI 9/13   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 He had a swallowing study in May and will repeat this. Continue ABs and current Lasix dose. No labs ordered today, will order for am.   Quintella Baton [phone] 12/08/2012, 10:12 AM   I have seen and evaluated the patient this AM along with Corine Shelter, PA. I agree with his findings, examination as well as impression recommendations.  Ins/Outs not accurately recorded,but seems much better than on admission  With Na concern - will actually need to check labs today.  WBC had also been elevasted.  Currently BP & HR stable & on PO Lasix.  Remains on DAPT.  Lung - sounds coarse in RLL - agree with possible recurrent aspiration PNA -- on Abx.  Steadily improving.  Is looking  like potential d/c this weekend.   MD Time with pt: 10 min  HARDING,DAVID W, M.D., M.S. [facility] [address] [place], [place]  [address]  [phone] Pager # [phone] 12/08/2012 1:05 PM

## 2012-12-08 NOTE — Plan of Care (Signed)
Problem: Phase I Progression Outcomes Goal: Dyspnea controlled at rest (HF) Outcome: Progressing Patient continues to have productive cough.  Breathing treatments continued.  Chest xray 12/07/12 continues to show infiltrates.  Could be started on antibiotics 12/08/12.  Will continue to monitor patient condition.

## 2012-12-08 NOTE — Progress Notes (Signed)
Utilization Review Completed Tajanay Hurley J. Francois Elk, RN, BSN, NCM [phone]  

## 2012-12-08 NOTE — Progress Notes (Signed)
SLP Cancellation Note  Patient Details Name: BUSH MURDOCH MRN: [id number] DOB: [birth year]   Cancelled treatment:       Reason Eval/Treat Not Completed: Other (comment) Received orders for MBS. Reviewed chart, pt with history of pharyngeal dysphagia with possible aspiration pna, recommended to consume honey thick liquids. Will plan for MBS tomorrow in am.    Thorsten Climer, Riley Nearing 12/08/2012, 1:15 PM

## 2012-12-08 NOTE — Progress Notes (Signed)
NUTRITION FOLLOW UP  Intervention:   1. Continue Ensure Complete- notified RN that this supplement will require thickening prior to pt drinking 2. Add Ensure Pudding po BID, each supplement provides 170 kcal and 4 grams of protein. If pt likes this supplement, can decrease ensure complete and increased pudding.   Nutrition Dx:   Inadequate oral intake related to poor appetite as evidenced by 25% meal intake.  Goal:   Pt to consume >90% of meal/supplements. Improving  Monitor:   PO intake, weight trends, labs, I/O's  Assessment:   Pt with possible aspiration PNA now, planned for swallow study today. Changed to Honey Thick liquids. Pt is drinking Ensure Complete once daily. This supplement will have to be thickened to honey thick for pt to safely drink. Will add Ensure Pudding daily.  Meal completion is improving.   Height: Ht Readings from Last 1 Encounters:  12/06/12 5\' 8"  (1.727 m)    Weight Status:   Wt Readings from Last 1 Encounters:  12/08/12 126 lb 1.6 oz (57.199 kg)  Down from 133 lbs at admission, on diuretics  Re-estimated needs:  Kcal: 1850-2100 Protein: 85-95 gm Fluid: 1.8-2 L   Skin: intact   Diet Order: Cardiac   Intake/Output Summary (Last 24 hours) at 12/08/12 1045 Last data filed at 12/08/12 0543  Gross per 24 hour  Intake   1223 ml  Output    400 ml  Net    823 ml    Last BM: 6/18   Labs:   Recent Labs Lab 12/02/12 2347  12/05/12 0520 12/06/12 0930 12/07/12 0520  NA 112*  < > 140 137 132*  K 4.3  < > 3.9 3.6 4.1  CL 69*  < > 96 92* 90*  CO2 32  < > 34* 34* 33*  BUN 24*  < > 27* 30* 30*  CREATININE 1.50*  < > 1.70* 1.50* 1.47*  CALCIUM 8.2*  < > 8.9 8.6 8.4  MG 1.8  --   --   --   --   GLUCOSE 97  < > 122* 148* 99  < > = values in this interval not displayed.  CBG (last 3)  No results found for this basename: GLUCAP,  in the last 72 hours  Scheduled Meds: . albuterol  2.5 mg Nebulization Q6H  . aspirin EC  81 mg Oral Daily  .  budesonide  0.25 mg Nebulization Q6H  . calcium carbonate  1 tablet Oral TID AC & HS  . clopidogrel  75 mg Oral Q breakfast  . feeding supplement  237 mL Oral BID BM  . furosemide  40 mg Oral BID  . heparin  5,000 Units Subcutaneous Q8H  . hydrALAZINE  25 mg Oral Q8H  . ipratropium  0.5 mg Nebulization Q6H  . isosorbide mononitrate  30 mg Oral Daily  . levothyroxine  100 mcg Oral QAC breakfast  . metoprolol succinate  25 mg Oral Daily  . pantoprazole  40 mg Oral Q1200  . potassium chloride  20 mEq Oral BID  . ranolazine  500 mg Oral BID  . sodium chloride  3 mL Intravenous Q12H      Clarene Duke RD, LDN Pager [phone] After Hours pager [phone]

## 2012-12-09 ENCOUNTER — Inpatient Hospital Stay (HOSPITAL_COMMUNITY): Payer: Medicare Other

## 2012-12-09 LAB — BASIC METABOLIC PANEL
BUN: 26 mg/dL — ABNORMAL HIGH (ref 6–23)
CO2: 30 mEq/L (ref 19–32)
Calcium: 9.3 mg/dL (ref 8.4–10.5)
Chloride: 88 mEq/L — ABNORMAL LOW (ref 96–112)
Creatinine, Ser: 1.53 mg/dL — ABNORMAL HIGH (ref 0.50–1.35)
GFR calc Af Amer: 51 mL/min — ABNORMAL LOW (ref >90)
GFR calc non Af Amer: 44 mL/min — ABNORMAL LOW (ref >90)
Glucose, Bld: 113 mg/dL — ABNORMAL HIGH (ref 70–99)
Potassium: 4.8 mEq/L (ref 3.5–5.1)
Sodium: 131 mEq/L — ABNORMAL LOW (ref 135–145)

## 2012-12-09 LAB — CBC
HCT: 34.2 % — ABNORMAL LOW (ref 39.0–52.0)
Hemoglobin: 11.2 g/dL — ABNORMAL LOW (ref 13.0–17.0)
MCH: 27 pg (ref 26.0–34.0)
MCHC: 32.7 g/dL (ref 30.0–36.0)
MCV: 82.4 fL (ref 78.0–100.0)
Platelets: 404 10*3/uL — ABNORMAL HIGH (ref 150–400)
RBC: 4.15 MIL/uL — ABNORMAL LOW (ref 4.22–5.81)
RDW: 15.7 % — ABNORMAL HIGH (ref 11.5–15.5)
WBC: 16.4 10*3/uL — ABNORMAL HIGH (ref 4.0–10.5)

## 2012-12-09 MED ORDER — LEVOFLOXACIN 750 MG PO TABS
750.0000 mg | ORAL_TABLET | ORAL | Status: DC
  Administered 2012-12-09: 750 mg via ORAL
  Filled 2012-12-09 (×2): qty 1

## 2012-12-09 MED ORDER — LEVOFLOXACIN 500 MG PO TABS
750.0000 mg | ORAL_TABLET | ORAL | Status: DC
  Filled 2012-12-09: qty 2

## 2012-12-09 NOTE — Progress Notes (Addendum)
Subjective: Feeling better.  Breathing better.  Still with productive cough.  Objective: Vital signs in last 24 hours: Temp:  [97.8 F (36.6 C)-98.1 F (36.7 C)] 97.8 F (36.6 C) (06/20 0645) Pulse Rate:  [76-88] 88 (06/20 0920) Resp:  [18] 18 (06/20 0645) BP: (143-166)/(77-98) 166/98 mmHg (06/20 0920) SpO2:  [92 %-97 %] 96 % (06/20 0833) Weight:  [125 lb 3.2 oz (56.79 kg)] 125 lb 3.2 oz (56.79 kg) (06/20 0645) Last BM Date: 12/07/12  Intake/Output from previous day: 06/19 0701 - 06/20 0700 In: 780 [P.O.:780] Out: 400 [Urine:400] Intake/Output this shift: Total I/O In: 243 [P.O.:240; I.V.:3] Out: -   Medications Current Facility-Administered Medications  Medication Dose Route Frequency Provider Last Rate Last Dose  . 0.9 %  sodium chloride infusion  250 mL Intravenous PRN Rollene Rotunda, MD 10 mL/hr at 12/05/12 0105 250 mL at 12/05/12 0105  . acetaminophen (TYLENOL) tablet 650 mg  650 mg Oral Q4H PRN Rollene Rotunda, MD   650 mg at 12/09/12 0925  . albuterol (PROVENTIL) (5 MG/ML) 0.5% nebulizer solution 2.5 mg  2.5 mg Nebulization Q6H Merwyn Katos, MD   2.5 mg at 12/09/12 1610  . albuterol (PROVENTIL) (5 MG/ML) 0.5% nebulizer solution 2.5 mg  2.5 mg Nebulization Q3H PRN Merwyn Katos, MD      . aspirin EC tablet 81 mg  81 mg Oral Daily Rollene Rotunda, MD   81 mg at 12/09/12 0920  . budesonide (PULMICORT) nebulizer solution 0.25 mg  0.25 mg Nebulization Q6H Merwyn Katos, MD   0.25 mg at 12/09/12 9604  . calcium carbonate (OS-CAL - dosed in mg of elemental calcium) tablet 500 mg of elemental calcium  1 tablet Oral TID AC & HS Rollene Rotunda, MD   500 mg of elemental calcium at 12/09/12 1208  . clopidogrel (PLAVIX) tablet 75 mg  75 mg Oral Q breakfast Rollene Rotunda, MD   75 mg at 12/09/12 0919  . feeding supplement (ENSURE COMPLETE) liquid 237 mL  237 mL Oral Q24H Tonye Becket, RD   237 mL at 12/09/12 1000  . feeding supplement (ENSURE) pudding 1 Container  1  Container Oral BID BM Tonye Becket, RD   1 Container at 12/09/12 1400  . furosemide (LASIX) tablet 40 mg  40 mg Oral BID Abelino Derrick, PA-C   40 mg at 12/09/12 0919  . heparin injection 5,000 Units  5,000 Units Subcutaneous Q8H Rollene Rotunda, MD   5,000 Units at 12/09/12 [id number]  . hydrALAZINE (APRESOLINE) injection 10 mg  10 mg Intravenous Q6H PRN Abelino Derrick, PA-C   10 mg at 12/05/12 1930  . hydrALAZINE (APRESOLINE) tablet 25 mg  25 mg Oral [address] [place], PA-C   25 mg at 12/09/12 8119  . ipratropium (ATROVENT) nebulizer solution 0.5 mg  0.5 mg Nebulization Q6H Merwyn Katos, MD   0.5 mg at 12/09/12 1478  . isosorbide mononitrate (IMDUR) 24 hr tablet 30 mg  30 mg Oral Daily Abelino Derrick, PA-C   30 mg at 12/09/12 0919  . levothyroxine (SYNTHROID, LEVOTHROID) tablet 100 mcg  100 mcg Oral QAC breakfast Rollene Rotunda, MD   100 mcg at 12/09/12 0919  . metoprolol succinate (TOPROL-XL) 24 hr tablet 25 mg  25 mg Oral Daily Rollene Rotunda, MD   25 mg at 12/09/12 0919  . nitroGLYCERIN (NITROSTAT) SL tablet 0.4 mg  0.4 mg Sublingual Q5 Min x 3 PRN Rollene Rotunda, MD      .  ondansetron (ZOFRAN) injection 4 mg  4 mg Intravenous Q6H PRN Rollene Rotunda, MD      . oxyCODONE (Oxy IR/ROXICODONE) immediate release tablet 5 mg  5 mg Oral Q6H PRN Chrystie Nose, MD   5 mg at 12/09/12 0925  . pantoprazole (PROTONIX) EC tablet 40 mg  40 mg Oral Q1200 Rollene Rotunda, MD   40 mg at 12/09/12 0919  . ranolazine (RANEXA) 12 hr tablet 500 mg  500 mg Oral BID Rollene Rotunda, MD   500 mg at 12/09/12 0920  . RESOURCE THICKENUP CLEAR   Oral PRN Runell Gess, MD      . sodium chloride 0.9 % injection 3 mL  3 mL Intravenous Q12H Rollene Rotunda, MD   3 mL at 12/09/12 0920  . sodium chloride 0.9 % injection 3 mL  3 mL Intravenous PRN Rollene Rotunda, MD   3 mL at 12/04/12 1035    PE: General appearance: alert, cooperative and no distress Lungs: clear to auscultation bilaterally Heart: regular rate and rhythm,  S1, S2 normal, no murmur, click, rub or gallop Extremities: No LEE Pulses: 2+ and symmetric Neurologic: Grossly normal  Lab Results:   Recent Labs  12/07/12 0520 12/08/12 1330 12/09/12 0530  WBC 14.0* 11.6* 16.4*  HGB 10.1* 9.8* 11.2*  HCT 30.8* 29.1* 34.2*  PLT 331 345 404*   BMET  Recent Labs  12/07/12 0520 12/08/12 1330 12/09/12 0530  NA 132* 129* 131*  K 4.1 4.3 4.8  CL 90* 89* 88*  CO2 33* 32 30  GLUCOSE 99 110* 113*  BUN 30* 24* 26*  CREATININE 1.47* 1.34 1.53*  CALCIUM 8.4 8.7 9.3   PT/INR No results found for this basename: LABPROT, INR,  in the last 72 hours Cholesterol No results found for this basename: CHOL,  in the last 72 hours Cardiac Enzymes No components found with this basename: TROPONIN,  CKMB,   Studies/Results: @RISRSLT2 @   Assessment/Plan   Principal Problem:   Acute on chronic combined systolic and diastolic congestive heart failure- EF of 30-35% via echo in 4/14 Active Problems:   Tobacco abuse, pt quit after MI   COPD (chronic obstructive pulmonary disease)   Altered mental status   PVD, Lt Ax-fem and fem-fem BPG 2/12   HTN (hypertension)   Dyslipidemia   History of CVA (cerebrovascular accident)   PAF, post MI 9/13   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  It does not appear he was ever started on an Abx.  WBCs increased.  Will add Levofloxacin 500mg  x 7days for ? Aspiration or hospital acquired PNA.   SP swallow study.  Mechanical soft diet/nectar thick liquids.  Hyponatremic but stable.  BP not well controlled.  Increasing  hydralazine to 50 Q8hr.    LOS: 7 days    HAGER, BRYAN 12/09/2012 1:07 PM  I have seen and examined the patient along with HAGER, BRYAN, PA.  I have reviewed the chart, notes and new data.  I agree with PA's note.  Agree with need for antibiotics. His level of consciousness was very poor and  it is quite possible he aspirated either before or after admission. Na level fairly stable, as is renal function. Target BP around 120/70 - agree with increased vasodilators. Still looks fragile and WBC not improving. Not ready for DC today.   Thurmon Fair, MD, [facility] [phone] 12/09/2012, 4:36 PM

## 2012-12-09 NOTE — Procedures (Signed)
Objective Swallowing Evaluation: Modified Barium Swallowing Study  Patient Details  Name: [name] [name] MRN: [id number] Date of Birth: [birth year]  Today's Date: 12/09/2012 Time: 0935-1005 SLP Time Calculation (min): 30 min  Past Medical History:  Past Medical History  Diagnosis Date  . Hypertension   . Hyperlipidemia   . Peripheral vascular disease     Hx of Ax-fem BPG, known LCCA disease  . CHF (congestive heart failure)     Ischemic cardiomyopathy EF 30 - 35%  . Cancer     skin cancer, Thyroid cancer  . Stroke 2004    minor  . Dysrhythmia     PAF  . COPD (chronic obstructive pulmonary disease)     PT DENIES USES O2 2 LITERS HS  . Heart murmur     Mild-moderate MR  . Hepatitis     AT AGE  64; unknown type    . Heart attack     03/17/12; subtotally occluded RCI with unsucessful PCI  . Coronary artery disease     Cardiologist is Dr. Nanetta Batty   . Thyroid cancer     Papillary  . Renal insufficiency    Past Surgical History:  Past Surgical History  Procedure Laterality Date  . Bypass graft  07/2010    axillobifemoral BPG  . Spine surgery  11/23/06    microdiscectomy L5-S1  . Hemorrhoid surgery    . Tonsillectomy    . Hernia repair  02/09/11    [facility]  . Cardiac catheterization      2013  . Coronary angioplasty      2013  . Thyroidectomy Bilateral 10/07/2012    Procedure: THYROIDECTOMY;  Surgeon: Darletta Moll, MD;  Location: [facility] OR;  Service: ENT;  Laterality: Bilateral;  . Radical neck dissection Left 10/07/2012    Procedure: RADICAL NECK DISSECTION;  Surgeon: Darletta Moll, MD;  Location: [facility] OR;  Service: ENT;  Laterality: Left;   HPI:  pmh significant for MI, COPD, ICM, respiratory failure, and thyroid CA with thyroidectomy and radical neck dissection 10/07/12.  BSE was ordered, however, SLP deferred bedside evaluation to proceed directly to objective study, given pt's history of pharyngeal dysphagia.     Assessment / Plan / Recommendation Clinical Impression  Dysphagia Diagnosis: Moderate oral phase dysphagia;Moderate pharyngeal phase dysphagia Clinical impression: Pr exhibits oral motor function within functional limits.  Pts pharyngeal dysphagia is sensory and motor based, and is characterized by delated swallow reflex, and poor airway protection.  Swallow reflex was noted to trigger at the vallecula across consistencies.  Penetration of thin liquids was seen via cup and straw, and regardless of compensatory positions.  Amount was mild, but penetrate was not cleared spontaneously.  Nectar thick liquid via cup and straw was not penetrated.  Puree, cracker, and barium table were all tolerated without penetration or aspiration.  An additional bolus of applesauce was given to facilitate clearing of the carium tablet in the esophagus    Treatment Recommendation  Therapy as outlined in treatment plan below    Diet Recommendation Dysphagia 3 (Mechanical Soft);Nectar-thick liquid (chop meats)   Liquid Administration via: Cup;Straw Medication Administration: Whole meds with puree (provide additional bolus to clear esophagus) Supervision: Patient able to self feed;Intermittent supervision to cue for compensatory strategies Compensations: Slow rate;Small sips/bites Postural Changes and/or Swallow Maneuvers: Seated upright 90 degrees;Upright 30-60 min after meal    Other  Recommendations Oral Care Recommendations: Oral care BID Other Recommendations: Order thickener from pharmacy;Clarify dietary restrictions   Follow Up  Recommendations  24 hour supervision/assistance    Frequency and Duration min 1 x/week  1 week   Pertinent Vitals/Pain Pt reports pain in right shoulder (9/10)    SLP Swallow Goals Patient will consume recommended diet without observed clinical signs of aspiration with: Supervision/safety Swallow Study Goal #1 - Progress: Progressing toward goal Patient will utilize recommended strategies during swallow to increase swallowing safety  with: Supervision/safety Swallow Study Goal #2 - Progress: Progressing toward goal   General HPI: pmh significant for MI, COPD, ICM, respiratory failure, and thyroid CA with thyroidectomy and radical neck dissection 10/07/12.  BSE was ordered, however, SLP deferred bedside evaluation to proceed directly to objective study, given pt's history of pharyngeal dysphagia. Type of Study: Modified Barium Swallowing Study Reason for Referral: Objectively evaluate swallowing function Diet Prior to this Study: Regular;Thin liquids Temperature Spikes Noted: No Respiratory Status: Room air History of Recent Intubation: No Behavior/Cognition: Alert;Cooperative;Hard of hearing;Pleasant mood Oral Cavity - Dentition: Adequate natural dentition Oral Motor / Sensory Function: Within functional limits Self-Feeding Abilities: Able to feed self Patient Positioning: Upright in chair Baseline Vocal Quality: Clear Volitional Cough: Weak Volitional Swallow: Able to elicit Anatomy: Within functional limits Pharyngeal Secretions: Not observed secondary MBS    Reason for Referral Objectively evaluate swallowing function   Oral Phase Oral Preparation/Oral Phase Oral Phase: WFL   Pharyngeal Phase Pharyngeal Phase Pharyngeal Phase: Impaired Pharyngeal - Nectar Pharyngeal - Nectar Cup: Delayed swallow initiation;Premature spillage to valleculae Pharyngeal - Nectar Straw: Delayed swallow initiation;Premature spillage to valleculae Pharyngeal - Thin Pharyngeal - Thin Cup: Delayed swallow initiation;Premature spillage to valleculae;Premature spillage to pyriform sinuses;Reduced airway/laryngeal closure;Penetration/Aspiration before swallow;Compensatory strategies attempted (Comment) (chin tuck ineffective in preventing penetration) Penetration/Aspiration details (thin cup): Material enters airway, remains ABOVE vocal cords and not ejected out Pharyngeal - Thin Straw: Delayed swallow initiation;Premature spillage to  valleculae Pharyngeal - Solids Pharyngeal - Puree: Delayed swallow initiation;Premature spillage to valleculae Pharyngeal - Multi-consistency: Delayed swallow initiation;Premature spillage to valleculae Pharyngeal - Pill: Delayed swallow initiation;Premature spillage to valleculae  Cervical Esophageal Phase       Celia B. Murvin [facility], CCC-SLP [phone] Cervical Esophageal Phase Cervical Esophageal Phase: Impaired Cervical Esophageal Phase - Solids Pill: Other (Comment) (additional bolus required to clear esophagus of pill)         Leigh Aurora 12/09/2012, 10:44 AM

## 2012-12-10 DIAGNOSIS — I509 Heart failure, unspecified: Secondary | ICD-10-CM

## 2012-12-10 DIAGNOSIS — D72829 Elevated white blood cell count, unspecified: Secondary | ICD-10-CM

## 2012-12-10 MED ORDER — RESOURCE THICKENUP CLEAR PO POWD: ORAL | Status: DC

## 2012-12-10 MED ORDER — LEVOFLOXACIN 750 MG PO TABS: 750.0000 mg | ORAL_TABLET | ORAL | Status: DC

## 2012-12-10 MED ORDER — ALBUTEROL SULFATE (5 MG/ML) 0.5% IN NEBU: 2.5000 mg | INHALATION_SOLUTION | RESPIRATORY_TRACT | Status: DC | PRN

## 2012-12-10 MED ORDER — HYDRALAZINE HCL 25 MG PO TABS: 50.0000 mg | ORAL_TABLET | Freq: Two times a day (BID) | ORAL | Status: DC

## 2012-12-10 MED ORDER — FUROSEMIDE 40 MG PO TABS: 40.0000 mg | ORAL_TABLET | Freq: Two times a day (BID) | ORAL | Status: AC

## 2012-12-10 MED ORDER — BUDESONIDE 0.25 MG/2ML IN SUSP: 0.2500 mg | Freq: Four times a day (QID) | RESPIRATORY_TRACT | Status: AC

## 2012-12-10 NOTE — Discharge Summary (Signed)
Physician Discharge Summary  Patient ID: [name] [name] MRN: [id number] DOB/AGE: Feb 21, 1941 72 y.o.  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 rate 18, height 5\' 8"  (1.727 m), weight 124 lb 14.4 oz (56.654 kg), SpO2 96.00%.   Disposition: 01-Home or Self Care    Follow-up Information   Follow up with Melayna Robarts, PA-C. (Our office will call you with followup appointment date and time.)    Contact information:   [address] [place] [address] [phone]       Signed: Wilburt Finlay 12/12/2012, 12:18 PM  Physician Discharge Summary  Patient ID: [name] [name] MRN: [id number] DOB/AGE: 31-May-1941 72 y.o.  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 His EF in April was 30-35%. There is mild to moderate mitral regurgitation. He was admitted at that time with acute on chronic respiratory failure. This was acute on chronic systolic and diastolic heart failure. He was at that time treated for possible pneumonia. He was hyponatremic and profoundly hypocalcemic at that time as well.  He did relatively well after discharge. He is being treated for active thyroid cancer and had extensive surgery apparently with residual disease. He has been on a very low salt diet. He has not been particularly reducing his fluids. However, his weights have been stable and actually are lower today than they were here last month. However, he's become progressively weaker. Over the last couple of days he's had increased dyspnea now  sleeping on 2 pillows. Because of the profound weakness he was brought to the emergency room. CXR demonstrated diffuse pulmonary edema. BNP was 64,029. In the emergency room he was treated with BiPAP and IV Lasix. His sodium was found to be 109. Calcium was normal. He denied any chest pressure, neck or arm discomfort. He had no palpitations, presyncope or syncope.  He has however had muscle cramping.  He was admitted with acute on chronic combined systolic and diastolic heart failure with chronic respiratory failure acute encephalopathy and hyponatremia. Milrinone was started along with IV diuretic.  This was also seen by pulmonary critical care was continued on supplemental O2 along with nebulized steroids and bronchodilators.  He was given Tolvaptan and Diamox.  After 24 hours patient had recovery in mental status. He was continued on milrinone for another 24 hours and it was discontinued.  Neck fluids for hospitalization were -3.7 L.  BNP decreased to 30,000.  He did develop a very productive cough with elevated WBCs. Concern for nosocomial or aspiration pneumonia. He was started on Levaquin by mouth which was continued at discharge.  Patient also had swallow evaluation. Recommendations were for mechanical soft diet with nectar thick fluids.  The patient was seen by Dr. Allyson Sabal who felt he was stable for DC home.  Transitional care management seven-day followup is arranged.   Consults: PCCM,   Significant Diagnostic Studies:   PORTABLE CHEST - 1 VIEW  Comparison: 06/16, 06/15, 06/13, and 10/19/2012  Findings: The infiltrate and effusion at the left lung base have appreciably diminished. There is chronic elevation of the left hemidiaphragm.  There is slight new pulmonary vascular prominence. Right lung is otherwise clear. Chronic cardiomegaly. Extensive calcification in the thoracic aorta. Multiple surgical clips in the left axilla.  IMPRESSION: Appreciable improvement in the infiltrate and effusion at the left base.  New prominence of the pulmonary vascularity.  CBC    Component Value Date/Time   WBC 16.4* 12/09/2012 0530   RBC 4.15* 12/09/2012 0530   HGB 11.2* 12/09/2012 0530   HCT 34.2* 12/09/2012 0530   PLT 404* 12/09/2012 0530   MCV 82.4 12/09/2012 0530   MCH 27.0 12/09/2012 0530   MCHC 32.7 12/09/2012 0530   RDW 15.7* 12/09/2012 0530    LYMPHSABS 1.4 12/02/2012 2347   MONOABS 1.4* 12/02/2012 2347   EOSABS 0.0 12/02/2012 2347   BASOSABS 0.0 12/02/2012 2347    BMET    Component Value Date/Time   NA 131* 12/09/2012 0530   K 4.8 12/09/2012 0530   CL 88* 12/09/2012 0530   CO2 30 12/09/2012 0530   GLUCOSE 113* 12/09/2012 0530   BUN 26* 12/09/2012 0530   CREATININE 1.53* 12/09/2012 0530   CALCIUM 9.3 12/09/2012 0530   GFRNONAA 44* 12/09/2012 0530   GFRAA 51* 12/09/2012 0530    Treatments: See above  Discharge Exam: Blood pressure 165/83, pulse 80, temperature 97.9 F (36.6 C), temperature source Oral, resp. rate 18, height 5\' 8"  (1.727 m), weight 124 lb 14.4 oz (56.654 kg), SpO2 96.00%.   Disposition: 01-Home or Self Care      Discharge Orders   Future Appointments Provider Department Dept Phone   12/16/2012 9:00 AM Wilburt Finlay, PA-C [facility] [place] [phone]   05/03/2013 11:00 AM [facility] [phone]   05/03/2013 11:40 AM Evern Bio, NP [facility] [phone]   Future Orders Complete By Expires     Diet - low sodium  heart healthy  As directed     Discharge instructions  As directed     Comments:      He came 1-2 pounds in 24 hours or 4-5 pounds in a week, call our office for instruction.    Increase activity slowly  As directed         Medication List    TAKE these medications       albuterol (5 MG/ML) 0.5% nebulizer solution  Commonly known as:  PROVENTIL  Take 0.5 mLs (2.5 mg total) by nebulization every 3 (three) hours as needed for wheezing or shortness of breath.     aspirin EC 81 MG tablet  Take 81 mg by mouth daily.     budesonide 0.25 MG/2ML nebulizer solution  Commonly known as:  PULMICORT  Take 2 mLs (0.25 mg total) by nebulization every 6 (six) hours.     calcium carbonate 1250 MG tablet  Commonly known as:  OS-CAL - dosed in mg of elemental calcium  Take 1 tablet (500 mg of  elemental calcium total) by mouth 4 (four) times daily.     clopidogrel 75 MG tablet  Commonly known as:  PLAVIX  Take 75 mg by mouth daily.     furosemide 40 MG tablet  Commonly known as:  LASIX  Take 1 tablet (40 mg total) by mouth 2 (two) times daily.     gabapentin 600 MG tablet  Commonly known as:  NEURONTIN  Take 600 mg by mouth 2 (two) times daily.     hydrALAZINE 25 MG tablet  Commonly known as:  APRESOLINE  Take 2 tablets (50 mg total) by mouth 2 (two) times daily.     isosorbide mononitrate 30 MG 24 hr tablet  Commonly known as:  IMDUR  Take 1 tablet (30 mg total) by mouth daily.     levofloxacin 750 MG tablet  Commonly known as:  LEVAQUIN  Take 1 tablet (750 mg total) by mouth every other day.     levothyroxine 100 MCG tablet  Commonly known as:  SYNTHROID  Take 1 tablet (100 mcg total) by mouth daily before breakfast.     metoprolol succinate 25 MG 24 hr tablet  Commonly known as:  TOPROL-XL  Take 25 mg by mouth daily.     nitroGLYCERIN 0.4 MG SL tablet  Commonly known as:  NITROSTAT  Place 1 tablet (0.4 mg total) under the tongue every 5 (five) minutes x 3 doses as needed for chest pain.     oxyCODONE-acetaminophen 5-325 MG per tablet  Commonly known as:  PERCOCET/ROXICET  Take 1-2 tablets by mouth every 4 (four) hours as needed for pain.     pantoprazole 40 MG tablet  Commonly known as:  PROTONIX  Take 1 tablet (40 mg total) by mouth daily at 12 noon.     potassium chloride 10 MEQ tablet  Commonly known as:  K-DUR,KLOR-CON  Take 10 mEq by mouth daily.     ranolazine 500 MG 12 hr tablet  Commonly known as:  RANEXA  Take 1 tablet (500 mg total) by mouth 2 (two) times daily.     RESOURCE THICKENUP CLEAR Powd  As directed on the packaging.     tiotropium 18 MCG inhalation capsule  Commonly known as:  SPIRIVA  Place 1 capsule (18 mcg total) into inhaler and inhale daily.       Follow-up Information   Follow up with Donica Derouin, PA-C. (Our  office will call you with followup  appointment date and time.)    Contact information:   [address] [place] [address] [phone]       Signed: Wilburt Finlay 12/12/2012, 12:18 PM

## 2012-12-10 NOTE — Care Management Note (Signed)
Cm spoke with patient concerning MD order for [facility] . Per pt recently active with [facility] to continue to provide services. [facility] notified of referral. No other needs identified. Pt's uses home oxygen provided by [facility]. Family to bring portable tank to bedside. Pt also uses home nebulizer machine.  Roxy Manns Rawson Minix,RN,BSN [phone]

## 2012-12-10 NOTE — Discharge Summary (Signed)
Physician Discharge Summary  Patient ID: [name] [name] MRN: [id number] DOB/AGE: [birth year] 72 y.o.  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 His EF in April was 30-35%. There is mild to moderate mitral regurgitation. He was admitted at that time with acute on chronic respiratory failure. This was acute on chronic systolic and diastolic heart failure. He was at that time treated for possible pneumonia. He was hyponatremic at that time as well. At that time he was profoundly hypocalcemic.   He did relatively well after discharge. He is being treated for active thyroid cancer and had extensive surgery apparently with residual disease. He has been on a very low salt diet. He has not been particularly reducing his fluids. However, his weights have been stable and actually are lower today than they were here last month. However, he's become progressively weaker. Over the last couple of days he's had increased dyspnea now  sleeping on 2 pillows. Because of the profound weakness he was brought to the emergency room. CXR demonstrated diffuse pulmonary edema. BNP was 64,029. In the emergency room he was treated with BiPAP and IV Lasix. His sodium was found to be 109. Calcium was normal. He denies any chest pressure, neck or arm discomfort. He's had no palpitations, presyncope or syncope. He has however had muscle cramping.  Patient was admitted to the ICU due to severe hyponatremia mental status changes. He was started on milrinone IV Lasix.  PCCM consult.  Free water was restricted.  Hypokalemia was repleted.  Patient ultimately had good diuresis with net fluids of -3.7 L. BNP decreased from 64,000 to 30,000. He had improvement in mental status changes as hyponatremia improved.  He did develop a more productive cough and was started on Levaquin for suspected hospital-acquired or aspiration pneumonia. This will be continued for 10 days total. Swallowing study was completed and it was recommended the patient be started on mechanical soft diet with nectar thick liquids. He was ultimately transitioned off the milrinone its started on by mouth diuretic. His recent chest x-ray showed appreciable improvement in the infiltrate and effusion left base.  The patient was seen by Dr. Allyson Sabal who felt he was stable for DC home.  Patient was set up for transitional care management followup in 7 days.  Consults: PCCM,  registered dietitian  Significant Diagnostic Studies:  PORTABLE CHEST - 1 VIEW  Comparison: 06/16, 06/15, 06/13, and 10/19/2012  Findings: The infiltrate and effusion at the left lung base have appreciably diminished. There is chronic elevation  of the left hemidiaphragm.  There is slight new pulmonary vascular prominence. Right lung is otherwise clear. Chronic cardiomegaly. Extensive calcification in the thoracic aorta. Multiple surgical clips in the left axilla.  IMPRESSION: Appreciable improvement in the infiltrate and  effusion at the left base.  New prominence of the pulmonary vascularity.  Swallow study recommendations Diet Recommendation Dysphagia 3 (Mechanical Soft);Nectar-thick liquid  (chop meats)   Liquid Administration via: Cup;Straw Medication Administration: Whole meds with puree (provide additional bolus  to clear esophagus) Supervision: Patient able to self feed;Intermittent supervision to cue for  compensatory strategies Compensations: Slow rate;Small sips/bites Postural Changes and/or Swallow Maneuvers: Seated upright 90  degrees;Upright 30-60 min after meal   CBC    Component Value Date/Time   WBC 16.4* 12/09/2012 0530   RBC 4.15* 12/09/2012 0530   HGB 11.2* 12/09/2012 0530   HCT 34.2* 12/09/2012 0530   PLT 404* 12/09/2012 0530   MCV 82.4 12/09/2012 0530   MCH 27.0 12/09/2012 0530   MCHC 32.7 12/09/2012 0530   RDW 15.7* 12/09/2012 0530   LYMPHSABS 1.4 12/02/2012 2347   MONOABS 1.4* 12/02/2012 2347   EOSABS 0.0 12/02/2012 2347   BASOSABS 0.0 12/02/2012 2347   BMET    Component Value Date/Time   NA 131* 12/09/2012 0530   K 4.8 12/09/2012 0530   CL 88* 12/09/2012 0530   CO2 30 12/09/2012 0530   GLUCOSE 113* 12/09/2012 0530   BUN 26* 12/09/2012 0530   CREATININE 1.53* 12/09/2012 0530   CALCIUM 9.3 12/09/2012 0530   GFRNONAA 44* 12/09/2012 0530   GFRAA 51* 12/09/2012 0530    Treatments: See above  Discharge Exam: Blood pressure 165/83, pulse 80, temperature 97.9 F (36.6 C), temperature source Oral, resp. rate 18, height 5\' 8"  (1.727 m), weight 124 lb 14.4 oz (56.654 kg), SpO2 96.00%.   Disposition: 01-Home or Self Care      Discharge Orders   Future Appointments Provider Department Dept Phone   05/03/2013 11:00 AM [facility] [phone]   05/03/2013 11:40 AM Evern Bio, NP [facility] [phone]   Future Orders Complete By Expires     Diet - low sodium heart healthy  As directed     Discharge  instructions  As directed     Comments:      He came 1-2 pounds in 24 hours or 4-5 pounds in a week, call our office for instruction.    Increase activity slowly  As directed         Medication List    TAKE these medications       albuterol (5 MG/ML) 0.5% nebulizer solution  Commonly known as:  PROVENTIL  Take 0.5 mLs (2.5 mg total) by nebulization every 3 (three) hours as needed for wheezing or shortness of breath.     aspirin EC 81 MG tablet  Take 81 mg by mouth daily.     budesonide 0.25 MG/2ML nebulizer solution  Commonly known as:  PULMICORT  Take 2 mLs (0.25 mg total) by nebulization every 6 (six) hours.     calcium carbonate 1250 MG tablet  Commonly known as:  OS-CAL - dosed in mg of elemental calcium  Take 1 tablet (500 mg of elemental calcium total) by mouth 4 (four) times daily.     clopidogrel 75 MG tablet  Commonly known as:  PLAVIX  Take 75 mg by mouth daily.     furosemide 40 MG tablet  Commonly known as:  LASIX  Take 1 tablet (40 mg total) by mouth 2 (two) times daily.     gabapentin 600 MG tablet  Commonly known as:  NEURONTIN  Take 600 mg by mouth 2 (two) times daily.     hydrALAZINE 25 MG tablet  Commonly known as:  APRESOLINE  Take 2 tablets (50 mg total) by mouth 2 (two) times daily.     isosorbide mononitrate 30 MG 24 hr tablet  Commonly known as:  IMDUR  Take 1 tablet (30 mg total) by mouth daily.     levofloxacin 750 MG tablet  Commonly known as:  LEVAQUIN  Take 1 tablet (750 mg total) by mouth every other day.     levothyroxine 100 MCG tablet  Commonly known as:  SYNTHROID  Take 1 tablet (100 mcg total) by mouth daily before breakfast.     metoprolol succinate 25 MG 24 hr tablet  Commonly known as:  TOPROL-XL  Take 25 mg by mouth daily.     nitroGLYCERIN 0.4 MG SL tablet  Commonly known as:  NITROSTAT  Place 1 tablet (0.4 mg total) under the tongue every 5 (five) minutes x 3 doses as needed for chest pain.      oxyCODONE-acetaminophen 5-325 MG per tablet  Commonly known as:  PERCOCET/ROXICET  Take 1-2 tablets by mouth every 4 (four) hours as needed for pain.     pantoprazole 40 MG tablet  Commonly known as:  PROTONIX  Take 1 tablet (40 mg total) by mouth daily at 12 noon.     potassium chloride 10 MEQ tablet  Commonly known as:  K-DUR,KLOR-CON  Take 10 mEq by mouth daily.     ranolazine 500 MG 12 hr tablet  Commonly known as:  RANEXA  Take 1 tablet (500 mg total) by mouth 2 (two) times daily.     RESOURCE THICKENUP CLEAR Powd  As directed on the packaging.     tiotropium 18 MCG inhalation capsule  Commonly known as:  SPIRIVA  Place 1 capsule (18 mcg total) into inhaler and inhale daily.       Follow-up Information   Follow up with Ell Tiso, PA-C. (Our office will call you with followup appointment date and time.)    Contact information:   [address] [place] [address] [phone]       Signed: Wilburt Finlay 12/10/2012, 9:02 AM

## 2012-12-10 NOTE — Progress Notes (Addendum)
Subjective:  Pt less SOB, no CP  Objective:  Temp:  [97.4 F (36.3 C)-98.8 F (37.1 C)] 97.9 F (36.6 C) (06/21 0510) Pulse Rate:  [73-88] 80 (06/21 0510) Resp:  [18] 18 (06/21 0510) BP: (143-166)/(74-98) 165/83 mmHg (06/21 0510) SpO2:  [96 %-97 %] 96 % (06/21 0510) Weight:  [124 lb 14.4 oz (56.654 kg)] 124 lb 14.4 oz (56.654 kg) (06/21 0510) Weight change: -1 lb 3.2 oz (-0.544 kg)  Intake/Output from previous day: 06/20 0701 - 06/21 0700 In: 843 [P.O.:840; I.V.:3] Out: 1100 [Urine:1100]  Intake/Output from this shift:    Physical Exam: General appearance: alert and no distress Neck: no adenopathy, no carotid bruit, supple, symmetrical, trachea midline, thyroid not enlarged, symmetric, no tenderness/mass/nodules and Mild incrfease in JVP Lungs: clear to auscultation bilaterally Heart: regular rate and rhythm, S1, S2 normal, no murmur, click, rub or gallop Extremities: extremities normal, atraumatic, no cyanosis or edema  Lab Results: Results for orders placed during the hospital encounter of 12/02/12 (from the past 48 hour(s))  CBC     Status: Abnormal   Collection Time    12/08/12  1:30 PM      Result Value Range   WBC 11.6 (*) 4.0 - 10.5 K/uL   RBC 3.55 (*) 4.22 - 5.81 MIL/uL   Hemoglobin 9.8 (*) 13.0 - 17.0 g/dL   HCT 16.1 (*) 09.6 - 04.5 %   MCV 82.0  78.0 - 100.0 fL   MCH 27.6  26.0 - 34.0 pg   MCHC 33.7  30.0 - 36.0 g/dL   RDW 40.9  81.1 - 91.4 %   Platelets 345  150 - 400 K/uL  COMPREHENSIVE METABOLIC PANEL     Status: Abnormal   Collection Time    12/08/12  1:30 PM      Result Value Range   Sodium 129 (*) 135 - 145 mEq/L   Potassium 4.3  3.5 - 5.1 mEq/L   Chloride 89 (*) 96 - 112 mEq/L   CO2 32  19 - 32 mEq/L   Glucose, Bld 110 (*) 70 - 99 mg/dL   BUN 24 (*) 6 - 23 mg/dL   Creatinine, Ser 7.82  0.50 - 1.35 mg/dL   Calcium 8.7  8.4 - 95.6 mg/dL   Total Protein 6.7  6.0 - 8.3 g/dL   Albumin 3.2 (*) 3.5 - 5.2 g/dL   AST 23  0 - 37 U/L   ALT 27  0  - 53 U/L   Alkaline Phosphatase 79  39 - 117 U/L   Total Bilirubin 0.3  0.3 - 1.2 mg/dL   GFR calc non Af Amer 52 (*) >90 mL/min   GFR calc Af Amer 60 (*) >90 mL/min   Comment:            The eGFR has been calculated     using the CKD EPI equation.     This calculation has not been     validated in all clinical     situations.     eGFR's persistently     <90 mL/min signify     possible Chronic Kidney Disease.  BASIC METABOLIC PANEL     Status: Abnormal   Collection Time    12/09/12  5:30 AM      Result Value Range   Sodium 131 (*) 135 - 145 mEq/L   Potassium 4.8  3.5 - 5.1 mEq/L   Chloride 88 (*) 96 - 112 mEq/L   CO2 30  19 -  32 mEq/L   Glucose, Bld 113 (*) 70 - 99 mg/dL   BUN 26 (*) 6 - 23 mg/dL   Creatinine, Ser 1.61 (*) 0.50 - 1.35 mg/dL   Calcium 9.3  8.4 - 09.6 mg/dL   GFR calc non Af Amer 44 (*) >90 mL/min   GFR calc Af Amer 51 (*) >90 mL/min   Comment:            The eGFR has been calculated     using the CKD EPI equation.     This calculation has not been     validated in all clinical     situations.     eGFR's persistently     <90 mL/min signify     possible Chronic Kidney Disease.  CBC     Status: Abnormal   Collection Time    12/09/12  5:30 AM      Result Value Range   WBC 16.4 (*) 4.0 - 10.5 K/uL   RBC 4.15 (*) 4.22 - 5.81 MIL/uL   Hemoglobin 11.2 (*) 13.0 - 17.0 g/dL   HCT 04.5 (*) 40.9 - 81.1 %   MCV 82.4  78.0 - 100.0 fL   MCH 27.0  26.0 - 34.0 pg   MCHC 32.7  30.0 - 36.0 g/dL   RDW 91.4 (*) 78.2 - 95.6 %   Platelets 404 (*) 150 - 400 K/uL    Imaging: Imaging results have been reviewed  Assessment/Plan:   1. Principal Problem: 2.   Acute on chronic combined systolic and diastolic congestive heart failure- EF of 30-35% via echo in 4/14 3. Active Problems: 4.   Tobacco abuse, pt quit after MI 5.   COPD (chronic obstructive pulmonary disease) 6.   Altered mental status 7.   PVD, Lt Ax-fem and fem-fem BPG 2/12 8.   HTN (hypertension) 9.    Dyslipidemia 10.   History of CVA (cerebrovascular accident) 19.   PAF, post MI 9/13 12.   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   COPD exacerbation 14.   CAD, unsuccessful RCA PCI Sept 2013 15.   Acute and chronic respiratory failure 16.   Acute hyponatremia 17.   Chronic renal disease, stage III 18.   Encephalopathy acute 19.   Hypothyroid 20.   Leukocytosis 21.   Nosocomial pneumonia 22.   Dysphagia, post thyroid surgery, possible aspiration pneumonia 23.   Time Spent Directly with Patient:  20 minutes  Length of Stay:  LOS: 8 days   Pt admitted with CHF 8 days ago. Diuresed . BNP decreasing 60K to 30K. SCr atable. WBC increased and pt is productive of purulent sputum. WBC 16K. Started on ATBX. ABFEB. I think pt can be D/Cd home today on 10 days of oral ATBX. TCM 7.  Runell Gess 12/10/2012, 7:03 AM

## 2012-12-12 ENCOUNTER — Telehealth: Payer: Self-pay | Admitting: Physician Assistant

## 2012-12-12 NOTE — Telephone Encounter (Signed)
Additional care management telephone call I spoke with [name] reports doing well. He still coughing up some mucus but it is improving he states.  He is continuing on Levaquin.  We discussed his upcoming appointment this coming Friday. He will call us sooner if anything comes up.  Panayiota Larkin 7:08 PM

## 2012-12-13 ENCOUNTER — Other Ambulatory Visit (HOSPITAL_COMMUNITY): Payer: Self-pay | Admitting: Endocrinology

## 2012-12-13 DIAGNOSIS — C73 Malignant neoplasm of thyroid gland: Secondary | ICD-10-CM

## 2012-12-15 ENCOUNTER — Encounter (HOSPITAL_COMMUNITY)
Admission: RE | Admit: 2012-12-15 | Discharge: 2012-12-15 | Disposition: A | Discharge status: Home / Self Care | Payer: Medicare Other | Source: Ambulatory Visit | Attending: Endocrinology | Admitting: Endocrinology

## 2012-12-15 DIAGNOSIS — M899 Disorder of bone, unspecified: Secondary | ICD-10-CM | POA: Insufficient documentation

## 2012-12-15 DIAGNOSIS — C73 Malignant neoplasm of thyroid gland: Secondary | ICD-10-CM

## 2012-12-15 DIAGNOSIS — I2510 Atherosclerotic heart disease of native coronary artery without angina pectoris: Secondary | ICD-10-CM | POA: Insufficient documentation

## 2012-12-15 DIAGNOSIS — R599 Enlarged lymph nodes, unspecified: Secondary | ICD-10-CM | POA: Insufficient documentation

## 2012-12-15 DIAGNOSIS — R911 Solitary pulmonary nodule: Secondary | ICD-10-CM | POA: Insufficient documentation

## 2012-12-15 MED ORDER — FLUDEOXYGLUCOSE F - 18 (FDG) INJECTION
17.5000 | Freq: Once | INTRAVENOUS | Status: AC | PRN
  Administered 2012-12-15: 17.5 via INTRAVENOUS

## 2012-12-16 ENCOUNTER — Encounter: Payer: Self-pay | Admitting: Physician Assistant

## 2012-12-16 ENCOUNTER — Ambulatory Visit (INDEPENDENT_AMBULATORY_CARE_PROVIDER_SITE_OTHER): Payer: Medicare Other | Admitting: Physician Assistant

## 2012-12-16 VITALS — BP 152/76 | HR 80 | Ht 68.0 in | Wt 125.6 lb

## 2012-12-16 DIAGNOSIS — I255 Ischemic cardiomyopathy: Secondary | ICD-10-CM

## 2012-12-16 DIAGNOSIS — I2510 Atherosclerotic heart disease of native coronary artery without angina pectoris: Secondary | ICD-10-CM

## 2012-12-16 DIAGNOSIS — Y95 Nosocomial condition: Secondary | ICD-10-CM

## 2012-12-16 DIAGNOSIS — I10 Essential (primary) hypertension: Secondary | ICD-10-CM

## 2012-12-16 DIAGNOSIS — I2589 Other forms of chronic ischemic heart disease: Secondary | ICD-10-CM

## 2012-12-16 DIAGNOSIS — F17200 Nicotine dependence, unspecified, uncomplicated: Secondary | ICD-10-CM

## 2012-12-16 DIAGNOSIS — J189 Pneumonia, unspecified organism: Secondary | ICD-10-CM

## 2012-12-16 DIAGNOSIS — Z720 Tobacco use: Secondary | ICD-10-CM

## 2012-12-16 DIAGNOSIS — R1310 Dysphagia, unspecified: Secondary | ICD-10-CM

## 2012-12-16 DIAGNOSIS — I519 Heart disease, unspecified: Secondary | ICD-10-CM

## 2012-12-16 DIAGNOSIS — J449 Chronic obstructive pulmonary disease, unspecified: Secondary | ICD-10-CM

## 2012-12-16 DIAGNOSIS — I5189 Other ill-defined heart diseases: Secondary | ICD-10-CM

## 2012-12-16 MED ORDER — HYDRALAZINE HCL 25 MG PO TABS: 75.0000 mg | ORAL_TABLET | Freq: Two times a day (BID) | ORAL | Status: AC

## 2012-12-16 NOTE — Assessment & Plan Note (Signed)
Patient appears well compensated at this time. No signs or symptoms of overt heart failure. Continues on Lasix 40 mg twice daily.

## 2012-12-16 NOTE — Progress Notes (Signed)
Date:  12/16/2012   ID:  [name], DOB [birth year], MRN [id number]  PCP:  [name], MD  Primary Cardiologist:  Allyson Sabal    History of Present Illness: [name] [name] is a 72 y.o. male  The patient has a history of ischemic cardiomyopathy. His EF in April was 30-35%. There is mild to moderate mitral regurgitation. He was admitted at that time with acute on chronic respiratory failure. This was acute on chronic systolic and diastolic heart failure. He was at that time treated for possible pneumonia. He was hyponatremic at that time as well. At that time he was profoundly hypocalcemic.   He did relatively well after discharge. He is being treated for active thyroid cancer and had extensive surgery apparently with residual disease. He has been on a very low salt diet. He has not been particularly reducing his fluids. However, his weights have been stable and actually are lower today than they were here last month. However, he's become progressively weaker. Over the last couple of days he's had increased dyspnea now sleeping on 2 pillows. Because of the profound weakness he was brought to the emergency room. CXR demonstrated diffuse pulmonary edema. BNP was 64,029. In the emergency room he was treated with BiPAP and IV Lasix. His sodium was found to be 109. Calcium was normal. He denies any chest pressure, neck or arm discomfort. He's had no palpitations, presyncope or syncope. He has however had muscle cramping.   Patient was admitted to the ICU due to severe hyponatremia mental status changes. He was started on milrinone IV Lasix. PCCM consult. Free water was restricted. Hypokalemia was repleted. Patient ultimately had good diuresis with net fluids of -3.7 L. BNP decreased from 64,000 to 30,000. He had improvement in mental status changes as hyponatremia improved. He did develop a more productive cough and was started on Levaquin for suspected hospital-acquired or aspiration pneumonia. This will  be continued for 10 days total. Swallowing study was completed and it was recommended the patient be started on mechanical soft diet with nectar thick liquids. He was ultimately transitioned off the milrinone its started on by mouth diuretic. His recent chest x-ray showed appreciable improvement in the infiltrate and effusion left base.  Patient presents today in followup to his recent hospitalization. He states he feels much better. He still has 2 doses of Levaquin to complete the prescription. He did just have a PET scan yesterday the results of which have not been discussed with the patient.  It did show extensive hypermetabolic adenopathy in the left sub-supraclavicular region, left retropectoral region and superior mediastinum. Hypermetabolic lytic bone lesion noted involving T1 vertebra. There is a nodule in the right lower lobe which exhibits malignant range and the uptake.  Patient does report a small amount of hematochezia and has been constipated for the last few days and has a recently had some relief after taking some laxative.  His daughter also reports he has not needed his oxygen in the last few days.  The patient currently denies nausea, vomiting, fever, chest pain, shortness of breath, orthopnea, dizziness, PND, cough, congestion, abdominal pain, melena, lower extremity edema, claudication. Most recent basic metabolic panel as the below. He is an improvement sodium and serum creatinine is relatively stable.  BMET    Component Value Date/Time   NA 131* 12/09/2012 0530   K 4.8 12/09/2012 0530   CL 88* 12/09/2012 0530   CO2 30 12/09/2012 0530   GLUCOSE 113* 12/09/2012 0530  BUN 26* 12/09/2012 0530   CREATININE 1.53* 12/09/2012 0530   CALCIUM 9.3 12/09/2012 0530   GFRNONAA 44* 12/09/2012 0530   GFRAA 51* 12/09/2012 0530      Wt Readings from Last 3 Encounters:  12/16/12 125 lb 9.6 oz (56.972 kg)  12/10/12 124 lb 14.4 oz (56.654 kg)  10/22/12 138 lb 3.2 oz (62.687 kg)     Past Medical  History  Diagnosis Date  . Hypertension   . Hyperlipidemia   . Peripheral vascular disease     Hx of Ax-fem BPG, known LCCA disease  . CHF (congestive heart failure)     Ischemic cardiomyopathy EF 30 - 35%  . Cancer     skin cancer, Thyroid cancer  . Stroke 2004    minor  . Dysrhythmia     PAF  . COPD (chronic obstructive pulmonary disease)     PT DENIES USES O2 2 LITERS HS  . Heart murmur     Mild-moderate MR  . Hepatitis     AT AGE  74; unknown type    . Heart attack     03/17/12; subtotally occluded RCI with unsucessful PCI  . Coronary artery disease     Cardiologist is Dr. Nanetta Batty   . Thyroid cancer     Papillary  . Renal insufficiency     Current Outpatient Prescriptions  Medication Sig Dispense Refill  . albuterol (PROVENTIL) (5 MG/ML) 0.5% nebulizer solution Take 0.5 mLs (2.5 mg total) by nebulization every 3 (three) hours as needed for wheezing or shortness of breath.  20 mL  12  . aspirin EC 81 MG tablet Take 81 mg by mouth daily.      . budesonide (PULMICORT) 0.25 MG/2ML nebulizer solution Take 2 mLs (0.25 mg total) by nebulization every 6 (six) hours.  60 mL  12  . calcium carbonate (OS-CAL - DOSED IN MG OF ELEMENTAL CALCIUM) 1250 MG tablet Take 1 tablet (500 mg of elemental calcium total) by mouth 4 (four) times daily.  120 tablet  5  . clopidogrel (PLAVIX) 75 MG tablet Take 75 mg by mouth daily.      . furosemide (LASIX) 40 MG tablet Take 1 tablet (40 mg total) by mouth 2 (two) times daily.  60 tablet  5  . gabapentin (NEURONTIN) 600 MG tablet Take 600 mg by mouth 2 (two) times daily.       . hydrALAZINE (APRESOLINE) 25 MG tablet Take 3 tablets (75 mg total) by mouth 2 (two) times daily.  180 tablet  5  . isosorbide mononitrate (IMDUR) 30 MG 24 hr tablet Take 1 tablet (30 mg total) by mouth daily.  30 tablet  6  . levofloxacin (LEVAQUIN) 750 MG tablet Take 1 tablet (750 mg total) by mouth every other day.  4 tablet  0  . levothyroxine (SYNTHROID,  LEVOTHROID) 125 MCG tablet Take 125 mcg by mouth daily before breakfast.      . Maltodextrin-Xanthan Gum (RESOURCE THICKENUP CLEAR) POWD As directed on the packaging.      . metoprolol succinate (TOPROL-XL) 25 MG 24 hr tablet Take 25 mg by mouth daily.      . nitroGLYCERIN (NITROSTAT) 0.4 MG SL tablet Place 1 tablet (0.4 mg total) under the tongue every 5 (five) minutes x 3 doses as needed for chest pain.  25 tablet  4  . oxyCODONE-acetaminophen (PERCOCET/ROXICET) 5-325 MG per tablet Take 1-2 tablets by mouth every 4 (four) hours as needed for pain.  40 tablet  0  . pantoprazole (PROTONIX) 40 MG tablet Take 1 tablet (40 mg total) by mouth daily at 12 noon.  30 tablet  2  . potassium chloride (K-DUR,KLOR-CON) 10 MEQ tablet Take 10 mEq by mouth daily.      . ranolazine (RANEXA) 500 MG 12 hr tablet Take 1 tablet (500 mg total) by mouth 2 (two) times daily.  30 tablet  0  . tiotropium (SPIRIVA) 18 MCG inhalation capsule Place 1 capsule (18 mcg total) into inhaler and inhale daily.  30 capsule  0   No current facility-administered medications for this visit.    Allergies:    Allergies  Allergen Reactions  . Ceclor (Cefaclor) Anaphylaxis    Recently took pcn for tooth problems, without issues  . Morphine And Related Other (See Comments)    Abnormal behavior    Social History:  The patient  reports that he has been smoking Cigarettes.  He has a 50 pack-year smoking history. He has never used smokeless tobacco. He reports that he drinks about 4.2 ounces of alcohol per week. He reports that he does not use illicit drugs.   ROS:  Please see the history of present illness.  All other systems reviewed and negative.   PHYSICAL EXAM: VS:  BP 152/76  Pulse 80  Ht 5\' 8"  (1.727 m)  Wt 125 lb 9.6 oz (56.972 kg)  BMI 19.1 kg/m2 Well nourished, well developed, in no acute distress HEENT: Pupils are equal round react to light accommodation extraocular movements are intact.  Neck: no JVDNo cervical  lymphadenopathy. Visible mass noted  noted on the left side of his trachea Cardiac: Regular rate and rhythm without murmurs rubs or gallops. Lungs:  clear to auscultation bilaterally, no wheezing, Mild rhonchi.  No rales Abd: soft, nontender, positive bowel sounds all quadrants, no hepatosplenomegaly Ext: no lower extremity edema.  2+ radial and PT pulses. Skin: warm and dry Neuro:  Grossly normal       ASSESSMENT AND PLAN:  Problem List Items Addressed This Visit   CAD, unsuccessful RCA PCI Sept 2013 (Chronic)   Relevant Medications      hydrALAZINE (APRESOLINE) tablet   COPD (chronic obstructive pulmonary disease) (Chronic)   Diastolic dysfunction, grade 2   Dysphagia, post thyroid surgery, possible aspiration pneumonia   HTN (hypertension) (Chronic)     I will increase hydralazine to 75 mg twice daily.    Relevant Medications      hydrALAZINE (APRESOLINE) tablet   Ischemic cardiomyopathy, EF 30-35% by echo 03/29/12 - improved to 50-55% by 05/2012 (Chronic)     Patient appears well compensated at this time. No signs or symptoms of overt heart failure. Continues on Lasix 40 mg twice daily.    Relevant Medications      hydrALAZINE (APRESOLINE) tablet   Nosocomial pneumonia     Patient still has 2 doses of Levaquin to take to complete the prescription    Tobacco abuse - Primary (Chronic)

## 2012-12-16 NOTE — Assessment & Plan Note (Signed)
Patient still has 2 doses of Levaquin to take to complete the prescription

## 2012-12-16 NOTE — Patient Instructions (Signed)
Monitor weight daily.  No salt.    followup in three months with Dr. Allyson Sabal

## 2012-12-16 NOTE — Assessment & Plan Note (Signed)
I will increase hydralazine to 75 mg twice daily.

## 2012-12-21 ENCOUNTER — Other Ambulatory Visit (HOSPITAL_COMMUNITY): Payer: Self-pay | Admitting: Endocrinology

## 2012-12-21 DIAGNOSIS — R911 Solitary pulmonary nodule: Secondary | ICD-10-CM

## 2012-12-26 ENCOUNTER — Telehealth: Payer: Self-pay | Admitting: *Deleted

## 2012-12-26 NOTE — Telephone Encounter (Signed)
Tiffany from radiology at [facility] called wanting to schedule a CT Biopsy for Mr [name] [name] want to know if its ok for patient to hold plavix, tiffany also stated that patient have been holding his plavix since Friday.

## 2012-12-28 ENCOUNTER — Other Ambulatory Visit (HOSPITAL_COMMUNITY): Payer: Self-pay | Admitting: Endocrinology

## 2012-12-28 DIAGNOSIS — C73 Malignant neoplasm of thyroid gland: Secondary | ICD-10-CM

## 2012-12-29 ENCOUNTER — Inpatient Hospital Stay (HOSPITAL_COMMUNITY)
Admission: EM | Admit: 2012-12-29 | Discharge: 2013-01-07 | DRG: 291 | Disposition: A | Discharge status: Home / Self Care | Payer: Medicare Other | Attending: Internal Medicine | Admitting: Internal Medicine

## 2012-12-29 ENCOUNTER — Telehealth: Payer: Self-pay | Admitting: Cardiovascular Disease

## 2012-12-29 ENCOUNTER — Encounter (HOSPITAL_COMMUNITY): Payer: Self-pay

## 2012-12-29 ENCOUNTER — Emergency Department (HOSPITAL_COMMUNITY): Payer: Medicare Other

## 2012-12-29 DIAGNOSIS — I639 Cerebral infarction, unspecified: Secondary | ICD-10-CM | POA: Diagnosis present

## 2012-12-29 DIAGNOSIS — R1310 Dysphagia, unspecified: Secondary | ICD-10-CM

## 2012-12-29 DIAGNOSIS — Z8673 Personal history of transient ischemic attack (TIA), and cerebral infarction without residual deficits: Secondary | ICD-10-CM

## 2012-12-29 DIAGNOSIS — I255 Ischemic cardiomyopathy: Secondary | ICD-10-CM

## 2012-12-29 DIAGNOSIS — Z79899 Other long term (current) drug therapy: Secondary | ICD-10-CM

## 2012-12-29 DIAGNOSIS — I10 Essential (primary) hypertension: Secondary | ICD-10-CM

## 2012-12-29 DIAGNOSIS — I252 Old myocardial infarction: Secondary | ICD-10-CM

## 2012-12-29 DIAGNOSIS — E43 Unspecified severe protein-calorie malnutrition: Secondary | ICD-10-CM | POA: Diagnosis present

## 2012-12-29 DIAGNOSIS — J449 Chronic obstructive pulmonary disease, unspecified: Secondary | ICD-10-CM

## 2012-12-29 DIAGNOSIS — I498 Other specified cardiac arrhythmias: Secondary | ICD-10-CM | POA: Diagnosis present

## 2012-12-29 DIAGNOSIS — I471 Supraventricular tachycardia: Secondary | ICD-10-CM | POA: Diagnosis present

## 2012-12-29 DIAGNOSIS — J9601 Acute respiratory failure with hypoxia: Secondary | ICD-10-CM

## 2012-12-29 DIAGNOSIS — R0902 Hypoxemia: Secondary | ICD-10-CM

## 2012-12-29 DIAGNOSIS — Z66 Do not resuscitate: Secondary | ICD-10-CM | POA: Diagnosis present

## 2012-12-29 DIAGNOSIS — F17200 Nicotine dependence, unspecified, uncomplicated: Secondary | ICD-10-CM | POA: Diagnosis present

## 2012-12-29 DIAGNOSIS — I5189 Other ill-defined heart diseases: Secondary | ICD-10-CM

## 2012-12-29 DIAGNOSIS — J189 Pneumonia, unspecified organism: Secondary | ICD-10-CM

## 2012-12-29 DIAGNOSIS — I480 Paroxysmal atrial fibrillation: Secondary | ICD-10-CM

## 2012-12-29 DIAGNOSIS — N183 Chronic kidney disease, stage 3 unspecified: Secondary | ICD-10-CM | POA: Diagnosis present

## 2012-12-29 DIAGNOSIS — J90 Pleural effusion, not elsewhere classified: Secondary | ICD-10-CM | POA: Diagnosis present

## 2012-12-29 DIAGNOSIS — E785 Hyperlipidemia, unspecified: Secondary | ICD-10-CM | POA: Diagnosis present

## 2012-12-29 DIAGNOSIS — I6340 Cerebral infarction due to embolism of unspecified cerebral artery: Secondary | ICD-10-CM | POA: Diagnosis present

## 2012-12-29 DIAGNOSIS — I4891 Unspecified atrial fibrillation: Secondary | ICD-10-CM | POA: Diagnosis present

## 2012-12-29 DIAGNOSIS — I509 Heart failure, unspecified: Secondary | ICD-10-CM | POA: Diagnosis present

## 2012-12-29 DIAGNOSIS — I313 Pericardial effusion (noninflammatory): Secondary | ICD-10-CM | POA: Diagnosis present

## 2012-12-29 DIAGNOSIS — J4489 Other specified chronic obstructive pulmonary disease: Secondary | ICD-10-CM | POA: Diagnosis present

## 2012-12-29 DIAGNOSIS — Z85828 Personal history of other malignant neoplasm of skin: Secondary | ICD-10-CM

## 2012-12-29 DIAGNOSIS — E876 Hypokalemia: Secondary | ICD-10-CM | POA: Diagnosis present

## 2012-12-29 DIAGNOSIS — I2510 Atherosclerotic heart disease of native coronary artery without angina pectoris: Secondary | ICD-10-CM

## 2012-12-29 DIAGNOSIS — I739 Peripheral vascular disease, unspecified: Secondary | ICD-10-CM | POA: Diagnosis present

## 2012-12-29 DIAGNOSIS — I472 Ventricular tachycardia: Secondary | ICD-10-CM

## 2012-12-29 DIAGNOSIS — IMO0002 Reserved for concepts with insufficient information to code with codable children: Secondary | ICD-10-CM

## 2012-12-29 DIAGNOSIS — J9620 Acute and chronic respiratory failure, unspecified whether with hypoxia or hypercapnia: Secondary | ICD-10-CM | POA: Diagnosis present

## 2012-12-29 DIAGNOSIS — G9340 Encephalopathy, unspecified: Secondary | ICD-10-CM

## 2012-12-29 DIAGNOSIS — R0602 Shortness of breath: Secondary | ICD-10-CM | POA: Diagnosis present

## 2012-12-29 DIAGNOSIS — Z9981 Dependence on supplemental oxygen: Secondary | ICD-10-CM

## 2012-12-29 DIAGNOSIS — Z7982 Long term (current) use of aspirin: Secondary | ICD-10-CM

## 2012-12-29 DIAGNOSIS — E039 Hypothyroidism, unspecified: Secondary | ICD-10-CM

## 2012-12-29 DIAGNOSIS — J441 Chronic obstructive pulmonary disease with (acute) exacerbation: Secondary | ICD-10-CM

## 2012-12-29 DIAGNOSIS — C73 Malignant neoplasm of thyroid gland: Secondary | ICD-10-CM | POA: Diagnosis present

## 2012-12-29 DIAGNOSIS — R4182 Altered mental status, unspecified: Secondary | ICD-10-CM

## 2012-12-29 DIAGNOSIS — I2589 Other forms of chronic ischemic heart disease: Secondary | ICD-10-CM | POA: Diagnosis present

## 2012-12-29 DIAGNOSIS — J810 Acute pulmonary edema: Secondary | ICD-10-CM | POA: Diagnosis present

## 2012-12-29 DIAGNOSIS — Z7902 Long term (current) use of antithrombotics/antiplatelets: Secondary | ICD-10-CM

## 2012-12-29 DIAGNOSIS — Z720 Tobacco use: Secondary | ICD-10-CM

## 2012-12-29 DIAGNOSIS — E871 Hypo-osmolality and hyponatremia: Secondary | ICD-10-CM

## 2012-12-29 DIAGNOSIS — I5043 Acute on chronic combined systolic (congestive) and diastolic (congestive) heart failure: Principal | ICD-10-CM | POA: Diagnosis present

## 2012-12-29 DIAGNOSIS — I70219 Atherosclerosis of native arteries of extremities with intermittent claudication, unspecified extremity: Secondary | ICD-10-CM

## 2012-12-29 DIAGNOSIS — I6529 Occlusion and stenosis of unspecified carotid artery: Secondary | ICD-10-CM | POA: Diagnosis present

## 2012-12-29 DIAGNOSIS — I319 Disease of pericardium, unspecified: Secondary | ICD-10-CM | POA: Diagnosis present

## 2012-12-29 DIAGNOSIS — R0600 Dyspnea, unspecified: Secondary | ICD-10-CM

## 2012-12-29 DIAGNOSIS — Z85850 Personal history of malignant neoplasm of thyroid: Secondary | ICD-10-CM

## 2012-12-29 DIAGNOSIS — I129 Hypertensive chronic kidney disease with stage 1 through stage 4 chronic kidney disease, or unspecified chronic kidney disease: Secondary | ICD-10-CM | POA: Diagnosis present

## 2012-12-29 LAB — BLOOD GAS, ARTERIAL
Acid-Base Excess: 14.6 mmol/L — ABNORMAL HIGH (ref 0.0–2.0)
Bicarbonate: 39.9 mEq/L — ABNORMAL HIGH (ref 20.0–24.0)
O2 Content: 2.5 L/min
O2 Saturation: 93.5 %
Patient temperature: 98.6

## 2012-12-29 LAB — CBC WITH DIFFERENTIAL/PLATELET
Basophils Relative: 0 % (ref 0–1)
Eosinophils Absolute: 0.1 10*3/uL (ref 0.0–0.7)
Eosinophils Relative: 1 % (ref 0–5)
Hemoglobin: 9.3 g/dL — ABNORMAL LOW (ref 13.0–17.0)
Lymphs Abs: 1.4 10*3/uL (ref 0.7–4.0)
MCH: 25.7 pg — ABNORMAL LOW (ref 26.0–34.0)
MCHC: 31.2 g/dL (ref 30.0–36.0)
MCV: 82.3 fL (ref 78.0–100.0)
Monocytes Absolute: 1.4 10*3/uL — ABNORMAL HIGH (ref 0.1–1.0)
Monocytes Relative: 14 % — ABNORMAL HIGH (ref 3–12)
RBC: 3.62 MIL/uL — ABNORMAL LOW (ref 4.22–5.81)

## 2012-12-29 LAB — URINALYSIS, ROUTINE W REFLEX MICROSCOPIC
Bilirubin Urine: NEGATIVE
Hgb urine dipstick: NEGATIVE
Protein, ur: NEGATIVE mg/dL
Urobilinogen, UA: 0.2 mg/dL (ref 0.0–1.0)

## 2012-12-29 LAB — BASIC METABOLIC PANEL
BUN: 25 mg/dL — ABNORMAL HIGH (ref 6–23)
Calcium: 10.9 mg/dL — ABNORMAL HIGH (ref 8.4–10.5)
Creatinine, Ser: 1.47 mg/dL — ABNORMAL HIGH (ref 0.50–1.35)
GFR calc Af Amer: 54 mL/min — ABNORMAL LOW (ref >90)
GFR calc non Af Amer: 46 mL/min — ABNORMAL LOW (ref >90)
Glucose, Bld: 87 mg/dL (ref 70–99)

## 2012-12-29 MED ORDER — ASPIRIN EC 81 MG PO TBEC
81.0000 mg | DELAYED_RELEASE_TABLET | Freq: Every day | ORAL | Status: DC
  Administered 2012-12-29 – 2013-01-04 (×7): 81 mg via ORAL
  Filled 2012-12-29 (×7): qty 1

## 2012-12-29 MED ORDER — SODIUM CHLORIDE 0.9 % IJ SOLN
3.0000 mL | Freq: Two times a day (BID) | INTRAMUSCULAR | Status: DC
  Administered 2012-12-29 – 2013-01-06 (×16): 3 mL via INTRAVENOUS

## 2012-12-29 MED ORDER — POTASSIUM CHLORIDE CRYS ER 10 MEQ PO TBCR
10.0000 meq | EXTENDED_RELEASE_TABLET | Freq: Every day | ORAL | Status: DC
  Administered 2012-12-30 – 2013-01-07 (×9): 10 meq via ORAL
  Filled 2012-12-29 (×10): qty 1

## 2012-12-29 MED ORDER — ISOSORBIDE MONONITRATE ER 30 MG PO TB24
30.0000 mg | ORAL_TABLET | Freq: Every day | ORAL | Status: DC
  Administered 2012-12-29 – 2012-12-30 (×2): 30 mg via ORAL
  Filled 2012-12-29 (×3): qty 1

## 2012-12-29 MED ORDER — BUDESONIDE 0.25 MG/2ML IN SUSP
0.2500 mg | Freq: Four times a day (QID) | RESPIRATORY_TRACT | Status: DC
  Administered 2012-12-30 (×4): 0.25 mg via RESPIRATORY_TRACT
  Filled 2012-12-29 (×7): qty 2

## 2012-12-29 MED ORDER — ALBUTEROL SULFATE (5 MG/ML) 0.5% IN NEBU
2.5000 mg | INHALATION_SOLUTION | Freq: Four times a day (QID) | RESPIRATORY_TRACT | Status: DC
  Administered 2012-12-30 (×4): 2.5 mg via RESPIRATORY_TRACT
  Filled 2012-12-29 (×5): qty 0.5

## 2012-12-29 MED ORDER — LEVOFLOXACIN IN D5W 750 MG/150ML IV SOLN
750.0000 mg | Freq: Once | INTRAVENOUS | Status: DC
  Filled 2012-12-29: qty 150

## 2012-12-29 MED ORDER — OXYCODONE-ACETAMINOPHEN 5-325 MG PO TABS
1.0000 | ORAL_TABLET | ORAL | Status: DC | PRN
  Administered 2012-12-30 – 2013-01-02 (×3): 1 via ORAL
  Filled 2012-12-29 (×4): qty 1

## 2012-12-29 MED ORDER — AZTREONAM 2 G IJ SOLR
2.0000 g | INTRAMUSCULAR | Status: AC
  Administered 2012-12-29: 2 g via INTRAVENOUS
  Filled 2012-12-29: qty 2

## 2012-12-29 MED ORDER — PANTOPRAZOLE SODIUM 40 MG PO TBEC
40.0000 mg | DELAYED_RELEASE_TABLET | Freq: Every day | ORAL | Status: DC
  Administered 2012-12-30: 40 mg via ORAL

## 2012-12-29 MED ORDER — ONDANSETRON HCL 4 MG/2ML IJ SOLN: 4.0000 mg | Freq: Four times a day (QID) | INTRAMUSCULAR | Status: DC | PRN

## 2012-12-29 MED ORDER — POTASSIUM CHLORIDE CRYS ER 20 MEQ PO TBCR
40.0000 meq | EXTENDED_RELEASE_TABLET | Freq: Once | ORAL | Status: DC
  Filled 2012-12-29: qty 2

## 2012-12-29 MED ORDER — VANCOMYCIN HCL IN DEXTROSE 750-5 MG/150ML-% IV SOLN
750.0000 mg | INTRAVENOUS | Status: DC
  Administered 2012-12-30: 750 mg via INTRAVENOUS
  Filled 2012-12-29: qty 150

## 2012-12-29 MED ORDER — CALCIUM CARBONATE 1250 (500 CA) MG PO TABS
1.0000 | ORAL_TABLET | Freq: Three times a day (TID) | ORAL | Status: DC
  Administered 2012-12-29 – 2013-01-07 (×33): 500 mg via ORAL
  Filled 2012-12-29 (×42): qty 1

## 2012-12-29 MED ORDER — HYDRALAZINE HCL 50 MG PO TABS
75.0000 mg | ORAL_TABLET | Freq: Two times a day (BID) | ORAL | Status: DC
  Administered 2012-12-29 – 2012-12-30 (×3): 75 mg via ORAL
  Filled 2012-12-29 (×5): qty 1

## 2012-12-29 MED ORDER — CALCITRIOL 0.5 MCG PO CAPS
0.5000 ug | ORAL_CAPSULE | Freq: Every day | ORAL | Status: DC
  Administered 2012-12-29 – 2013-01-07 (×10): 0.5 ug via ORAL
  Filled 2012-12-29 (×10): qty 1

## 2012-12-29 MED ORDER — CLOPIDOGREL BISULFATE 75 MG PO TABS
75.0000 mg | ORAL_TABLET | Freq: Every day | ORAL | Status: DC
  Administered 2012-12-30 – 2013-01-04 (×6): 75 mg via ORAL
  Filled 2012-12-29 (×7): qty 1

## 2012-12-29 MED ORDER — ACETAMINOPHEN 325 MG PO TABS: 650.0000 mg | ORAL_TABLET | ORAL | Status: DC | PRN

## 2012-12-29 MED ORDER — RESOURCE THICKENUP CLEAR PO POWD
ORAL | Status: DC | PRN
  Filled 2012-12-29: qty 125

## 2012-12-29 MED ORDER — TIOTROPIUM BROMIDE MONOHYDRATE 18 MCG IN CAPS
18.0000 ug | ORAL_CAPSULE | Freq: Every day | RESPIRATORY_TRACT | Status: DC
  Administered 2012-12-29 – 2013-01-07 (×8): 18 ug via RESPIRATORY_TRACT
  Filled 2012-12-29 (×3): qty 5

## 2012-12-29 MED ORDER — NITROGLYCERIN 0.4 MG SL SUBL: 0.4000 mg | SUBLINGUAL_TABLET | SUBLINGUAL | Status: DC | PRN

## 2012-12-29 MED ORDER — SODIUM CHLORIDE 0.9 % IJ SOLN: 3.0000 mL | INTRAMUSCULAR | Status: DC | PRN

## 2012-12-29 MED ORDER — SODIUM CHLORIDE 0.9 % IV SOLN: 250.0000 mL | INTRAVENOUS | Status: DC | PRN

## 2012-12-29 MED ORDER — LEVOTHYROXINE SODIUM 125 MCG PO TABS
125.0000 ug | ORAL_TABLET | Freq: Every day | ORAL | Status: DC
  Administered 2012-12-30 – 2013-01-07 (×9): 125 ug via ORAL
  Filled 2012-12-29 (×11): qty 1

## 2012-12-29 MED ORDER — METHYLPREDNISOLONE SODIUM SUCC 125 MG IJ SOLR
60.0000 mg | Freq: Two times a day (BID) | INTRAMUSCULAR | Status: DC
  Administered 2012-12-29 – 2012-12-30 (×3): 60 mg via INTRAVENOUS
  Filled 2012-12-29 (×4): qty 0.96

## 2012-12-29 MED ORDER — METOPROLOL SUCCINATE ER 25 MG PO TB24
25.0000 mg | ORAL_TABLET | Freq: Every day | ORAL | Status: DC
  Administered 2012-12-29 – 2013-01-07 (×10): 25 mg via ORAL
  Filled 2012-12-29 (×10): qty 1

## 2012-12-29 MED ORDER — HEPARIN SODIUM (PORCINE) 5000 UNIT/ML IJ SOLN
5000.0000 [IU] | Freq: Three times a day (TID) | INTRAMUSCULAR | Status: DC
  Administered 2012-12-29 – 2013-01-04 (×17): 5000 [IU] via SUBCUTANEOUS
  Filled 2012-12-29 (×21): qty 1

## 2012-12-29 MED ORDER — VANCOMYCIN HCL IN DEXTROSE 1-5 GM/200ML-% IV SOLN
1000.0000 mg | Freq: Once | INTRAVENOUS | Status: AC
  Administered 2012-12-29: 1000 mg via INTRAVENOUS
  Filled 2012-12-29: qty 200

## 2012-12-29 MED ORDER — POTASSIUM CHLORIDE CRYS ER 20 MEQ PO TBCR
20.0000 meq | EXTENDED_RELEASE_TABLET | Freq: Once | ORAL | Status: AC
  Administered 2012-12-29: 20 meq via ORAL
  Filled 2012-12-29: qty 1

## 2012-12-29 MED ORDER — FUROSEMIDE 10 MG/ML IJ SOLN
40.0000 mg | Freq: Two times a day (BID) | INTRAMUSCULAR | Status: AC
  Administered 2012-12-30: 40 mg via INTRAVENOUS
  Filled 2012-12-29 (×2): qty 4

## 2012-12-29 MED ORDER — FUROSEMIDE 10 MG/ML IJ SOLN
20.0000 mg | Freq: Once | INTRAMUSCULAR | Status: AC
  Administered 2012-12-29: 20 mg via INTRAVENOUS
  Filled 2012-12-29: qty 2

## 2012-12-29 NOTE — Telephone Encounter (Signed)
[name] [name] is calling because her dad is having a hard time staying awake, falling asleep in mid sentence , not making sense when he is telling you something, have been dropping things . Wants to know should she bring in for an office visit or take him to the ER.  Please Call.   Thanks

## 2012-12-29 NOTE — Progress Notes (Signed)
Pt's daughter French Ana stated pt's diet at home was Nectar thick w/pills crushed in apple sauce.  Dr. Arbutus Leas informed and instructed to order Dys 3 with thicken liq.  Order placed.   Daughter informed and also asked if diet can be changed to low iodine diet as pt  Had started that yest.  For up coming surgery..  Will notified Dr. Arbutus Leas.  Amanda Pea, Charity fundraiser.

## 2012-12-29 NOTE — ED Provider Notes (Addendum)
History    CSN: [id number] Arrival date & time 12/29/12  1344  First MD Initiated Contact with Patient 12/29/12 1415     No chief complaint on file.  (Consider location/radiation/quality/duration/timing/severity/associated sxs/prior Treatment) HPI Comments: 72 yo male with complex medical hx including copd, CHF, MI, O2 home [place], pericardial effusion, pvd presents with dyspnea worsening the past three days with productive cough/ sputum.  Similar to chf and pneumonia hx.  Pt requiring 3 L [place] constantly, normally on 1-2 L [place] as needed.  No cp.  Dr Allyson Sabal Cardio.  Dr Ricki Miller PCP.  Improves with rest.  No known med changes, weight gain or worsening orthopnea.   The history is provided by the patient and a relative.   Past Medical History  Diagnosis Date  . Hypertension   . Hyperlipidemia   . Peripheral vascular disease     Hx of Ax-fem BPG, known LCCA disease  . CHF (congestive heart failure)     Ischemic cardiomyopathy EF 30 - 35%  . Cancer     skin cancer, Thyroid cancer  . Stroke 2004    minor  . Dysrhythmia     PAF  . COPD (chronic obstructive pulmonary disease)     PT DENIES USES O2 2 LITERS HS  . Heart murmur     Mild-moderate MR  . Hepatitis     AT AGE  56; unknown type    . Heart attack     03/17/12; subtotally occluded RCI with unsucessful PCI  . Coronary artery disease     Cardiologist is Dr. Nanetta Batty   . Thyroid cancer     Papillary  . Renal insufficiency    Past Surgical History  Procedure Laterality Date  . Bypass graft  07/2010    axillobifemoral BPG  . Spine surgery  11/23/06    microdiscectomy L5-S1  . Hemorrhoid surgery    . Tonsillectomy    . Hernia repair  02/09/11    [facility]  . Cardiac catheterization      2013  . Coronary angioplasty      2013  . Thyroidectomy Bilateral 10/07/2012    Procedure: THYROIDECTOMY;  Surgeon: Darletta Moll, MD;  Location: [facility] OR;  Service: ENT;  Laterality: Bilateral;  . Radical neck dissection Left 10/07/2012    Procedure:  RADICAL NECK DISSECTION;  Surgeon: Darletta Moll, MD;  Location: [facility] OR;  Service: ENT;  Laterality: Left;   Family History  Problem Relation Age of Onset  . Alzheimer's disease Mother   . Cancer Father     prostate  . Cancer Brother     prostate  . Stroke Brother    History  Substance Use Topics  . Smoking status: Current Some Day Smoker -- 1.00 packs/day for 50 years    Types: Cigarettes    Last Attempt to Quit: 03/16/2012  . Smokeless tobacco: Never Used     Comment: Smoking 4 -5 per day. now smoking electronic cigs  . Alcohol Use: 4.2 oz/week    7 Cans of beer per week     Comment: 1-2 drinks every few days    Review of Systems  Constitutional: Positive for fatigue. Negative for fever and chills.  HENT: Negative for neck pain.   Eyes: Negative for visual disturbance.  Respiratory: Positive for cough and shortness of breath.   Cardiovascular: Negative for chest pain and leg swelling.  Gastrointestinal: Negative for vomiting and abdominal pain.  Genitourinary: Negative for dysuria.  Musculoskeletal: Positive for  back pain.  Skin: Negative for rash.  Neurological: Negative for light-headedness and headaches.    Allergies  Ceclor and Morphine and related  Home Medications   Current Outpatient Rx  Name  Route  Sig  Dispense  Refill  . albuterol (PROVENTIL) (5 MG/ML) 0.5% nebulizer solution   Nebulization   Take 0.5 mLs (2.5 mg total) by nebulization every 3 (three) hours as needed for wheezing or shortness of breath.   20 mL   12   . aspirin EC 81 MG tablet   Oral   Take 81 mg by mouth daily.         . budesonide (PULMICORT) 0.25 MG/2ML nebulizer solution   Nebulization   Take 2 mLs (0.25 mg total) by nebulization every 6 (six) hours.   60 mL   12   . calcitRIOL (ROCALTROL) 0.5 MCG capsule   Oral   Take 0.5 mcg by mouth daily.         . calcium carbonate (OS-CAL - DOSED IN MG OF ELEMENTAL CALCIUM) 1250 MG tablet   Oral   Take 1 tablet (500 mg of  elemental calcium total) by mouth 4 (four) times daily.   120 tablet   5   . clopidogrel (PLAVIX) 75 MG tablet   Oral   Take 75 mg by mouth daily.         . furosemide (LASIX) 40 MG tablet   Oral   Take 1 tablet (40 mg total) by mouth 2 (two) times daily.   60 tablet   5   . gabapentin (NEURONTIN) 600 MG tablet   Oral   Take 600 mg by mouth 2 (two) times daily.          . hydrALAZINE (APRESOLINE) 25 MG tablet   Oral   Take 3 tablets (75 mg total) by mouth 2 (two) times daily.   180 tablet   5   . isosorbide mononitrate (IMDUR) 30 MG 24 hr tablet   Oral   Take 1 tablet (30 mg total) by mouth daily.   30 tablet   6   . levothyroxine (SYNTHROID, LEVOTHROID) 125 MCG tablet   Oral   Take 125 mcg by mouth daily before breakfast.         . Maltodextrin-Xanthan Gum (RESOURCE THICKENUP CLEAR) POWD      As directed on the packaging.         . metoprolol succinate (TOPROL-XL) 25 MG 24 hr tablet   Oral   Take 25 mg by mouth daily.         . nitroGLYCERIN (NITROSTAT) 0.4 MG SL tablet   Sublingual   Place 1 tablet (0.4 mg total) under the tongue every 5 (five) minutes x 3 doses as needed for chest pain.   25 tablet   4   . oxyCODONE-acetaminophen (PERCOCET/ROXICET) 5-325 MG per tablet   Oral   Take 1-2 tablets by mouth every 4 (four) hours as needed for pain.   40 tablet   0   . pantoprazole (PROTONIX) 40 MG tablet   Oral   Take 1 tablet (40 mg total) by mouth daily at 12 noon.   30 tablet   2   . potassium chloride (K-DUR,KLOR-CON) 10 MEQ tablet   Oral   Take 10 mEq by mouth daily.         Marland Kitchen tiotropium (SPIRIVA) 18 MCG inhalation capsule   Inhalation   Place 1 capsule (18 mcg total) into inhaler and inhale daily.  30 capsule   0    BP 174/79  Pulse 73  Temp(Src) 98 F (36.7 C) (Oral)  Resp 21  Ht 5\' 8"  (1.727 m)  Wt 121 lb (54.885 kg)  BMI 18.4 kg/m2  SpO2 90% Physical Exam  Nursing note and vitals reviewed. Constitutional: He is  oriented to person, place, and time. He appears well-developed. No distress.  HENT:  Head: Normocephalic and atraumatic.  Eyes: Conjunctivae are normal. Right eye exhibits no discharge. Left eye exhibits no discharge.  Neck: Normal range of motion. Neck supple. No JVD present. No tracheal deviation present.  Cardiovascular: Normal rate and regular rhythm.   Pulmonary/Chest: Effort normal. He has rales (bases bilateral mild).  Abdominal: Soft. He exhibits no distension. There is no tenderness. There is no guarding.  Musculoskeletal: He exhibits no edema.  Neurological: He is alert and oriented to person, place, and time. No cranial nerve deficit.  Skin: Skin is warm. No rash noted.  Psychiatric: He has a normal mood and affect.    ED Course  Procedures (including critical care time) Labs Reviewed  CBC WITH DIFFERENTIAL - Abnormal; Notable for the following:    RBC 3.62 (*)    Hemoglobin 9.3 (*)    HCT 29.8 (*)    MCH 25.7 (*)    RDW 16.8 (*)    Monocytes Relative 14 (*)    Monocytes Absolute 1.4 (*)    All other components within normal limits  BASIC METABOLIC PANEL - Abnormal; Notable for the following:    Potassium 3.3 (*)    Chloride 90 (*)    CO2 36 (*)    BUN 25 (*)    Creatinine, Ser 1.47 (*)    Calcium 10.9 (*)    GFR calc non Af Amer 46 (*)    GFR calc Af Amer 54 (*)    All other components within normal limits  CULTURE, BLOOD (ROUTINE X 2)  CULTURE, BLOOD (ROUTINE X 2)  TROPONIN I  URINALYSIS, ROUTINE W REFLEX MICROSCOPIC  PRO B NATRIURETIC PEPTIDE   Dg Chest 2 View  12/29/2012   *RADIOLOGY REPORT*  Clinical Data: Cough, congestion, wheezing.  Thyroid cancer.  CHEST - 2 VIEW  Comparison: 12/07/2012.  Findings: Chronic cardiopericardial enlargement.  The mediastinal contours are distorted by rightward rotation.  There is extensive aortic calcification.  Abnormal left lower lobe aeration, with elevation of the diaphragm. Morphology of the lower lung may represent  subpulmonic pleural fluid.  Chronic coarsening of interstitial markings, likely chronic lung disease rather than edema.  Small pulmonary nodules noted on previous PET-CT not clearly seen today. Right third, fourth, fifth, seventh, and eighth rib fractures, present on previous PET imaging.  IMPRESSION: 1.  Abnormal aeration at the left base, suspicious for pneumonia with small pleural effusion. 2. Numerous nonacute, but potentially unhealed upper right rib fractures.   Original Report Authenticated By: Tiburcio Pea   1. HCAP (healthcare-associated pneumonia)   2. Dyspnea   3. HTN (hypertension)     MDM  Emergency medicine focused ultrasound at bedside. Cardiac ultrasound using multi purpose probe evaluated heart in parasternal long,  Parasternal short, subxyphoid and apical positions.  Indication: shortness of breath Findings evidence of small pericardial fluid  Moderate decr gross EF Images stored to sonosite.   Date: 12/29/2012  Rate: 97  Rhythm: normal sinus rhythm  QRS Axis: normal  Intervals: PR prolonged  ST/T Wave abnormalities: nonspecific ST changes  Conduction Disutrbances:first-degree A-V block   Narrative Interpretation:   Old  EKG Reviewed: unchanged   CHF exac vs pneumonia.  Pt comfortable on 3 L [place] and rechecks.  Spoke with hospitalist Dr Tat, will see patient, rec full admission and tele.  Updated family.  CXR read as left lower pneumonia, reviewed by myself and radiology.   Spoke with cardiology, recommended paging Dr Herbie [place] as needed. Rechecked, pt comfortable.   HCAP abx and cultures.  Small lasix dose with bnp elevated and chf exac component. Admission placed.    Enid Skeens, MD 12/29/12 1650  Enid Skeens, MD 12/29/12 1702  Enid Skeens, MD 12/29/12 [phone]

## 2012-12-29 NOTE — Progress Notes (Signed)
CRITICAL VALUE ALERT  Critical value received:  PCO2 62.4  Date of notification:  12/29/2012  Time of notification:  1920  Critical value read back:yes  Nurse who received alert:  Amanda Pea RN  MD notified (1st page):  L. [place]  Time of first page:  1920  MD notified (2nd page):  Time of second page:  Responding MD:  Wyn Forster  Time MD responded:  82  Spoke with midlevel on call for floor coverage regarding results.  Pt alert and oriented at this time.  Sitting on the side of the bed eating dinner and appears in no acute distress. Nino Glow RN

## 2012-12-29 NOTE — Progress Notes (Signed)
ANTIBIOTIC CONSULT NOTE - INITIAL  Pharmacy Consult for vancomycin Indication: rule out pneumonia  Allergies  Allergen Reactions  . Ceclor (Cefaclor) Anaphylaxis    Recently took pcn for tooth problems, without issues  . Morphine And Related Other (See Comments)    Abnormal behavior    Patient Measurements: Height: 5\' 8"  (172.7 cm) Weight: 121 lb (54.885 kg) IBW/kg (Calculated) : 68.4 Adjusted Body Weight:   Vital Signs: Temp: 98 F (36.7 C) (07/10 1358) Temp src: Oral (07/10 1358) BP: 174/79 mmHg (07/10 1600) Pulse Rate: 73 (07/10 1600) Intake/Output from previous day:   Intake/Output from this shift:    Labs:  Recent Labs  12/29/12 1421  WBC 10.3  HGB 9.3*  PLT 358  CREATININE 1.47*   Estimated Creatinine Clearance: 35.8 ml/min (by C-G formula based on Cr of 1.47). No results found for this basename: VANCOTROUGH, Leodis Binet, VANCORANDOM, GENTTROUGH, GENTPEAK, GENTRANDOM, TOBRATROUGH, TOBRAPEAK, TOBRARND, AMIKACINPEAK, AMIKACINTROU, AMIKACIN,  in the last 72 hours   Microbiology: Recent Results (from the past 720 hour(s))  MRSA PCR SCREENING     Status: None   Collection Time    12/02/12 10:55 PM      Result Value Range Status   MRSA by PCR NEGATIVE  NEGATIVE Final   Comment:            The GeneXpert MRSA Assay (FDA     approved for NASAL specimens     only), is one component of a     comprehensive MRSA colonization     surveillance program. It is not     intended to diagnose MRSA     infection nor to guide or     monitor treatment for     MRSA infections.    Medical History: Past Medical History  Diagnosis Date  . Hypertension   . Hyperlipidemia   . Peripheral vascular disease     Hx of Ax-fem BPG, known LCCA disease  . CHF (congestive heart failure)     Ischemic cardiomyopathy EF 30 - 35%  . Cancer     skin cancer, Thyroid cancer  . Stroke 2004    minor  . Dysrhythmia     PAF  . COPD (chronic obstructive pulmonary disease)     PT DENIES  USES O2 2 LITERS HS  . Heart murmur     Mild-moderate MR  . Hepatitis     AT AGE  46; unknown type    . Heart attack     03/17/12; subtotally occluded RCI with unsucessful PCI  . Coronary artery disease     Cardiologist is Dr. Nanetta Batty   . Thyroid cancer     Papillary  . Renal insufficiency     Medications:  Anti-infectives   Start     Dose/Rate Route Frequency Ordered Stop   12/30/12 1800  vancomycin (VANCOCIN) IVPB 750 mg/150 ml premix     750 mg 150 mL/hr over 60 Minutes Intravenous Every 24 hours 12/29/12 1625     12/29/12 1600  aztreonam (AZACTAM) 2 g in dextrose 5 % 50 mL IVPB     2 g 100 mL/hr over 30 Minutes Intravenous To Emergency Dept 12/29/12 1534 12/30/12 1600   12/29/12 1545  vancomycin (VANCOCIN) IVPB 1000 mg/200 mL premix     1,000 mg 200 mL/hr over 60 Minutes Intravenous  Once 12/29/12 1542     12/29/12 1530  levofloxacin (LEVAQUIN) IVPB 750 mg  Status:  Discontinued     750 mg 100 mL/hr  over 90 Minutes Intravenous  Once 12/29/12 1517 12/29/12 1625     Assessment: 71 yom presented to the ED with SOB and cough. Pt is currently afebrile and WBC is WNL. MD ordered 1x doses of aztreonam 2gm and levaquin 750mg . Also to start vancomycin per pharmacy.   Goal of Therapy:  Vancomycin trough level 15-20 mcg/ml  Plan:  1. Vancomycin 1gm x 1 then 750mg  IV Q24H x 8 days 2. F/u renal fxn, C&S, clinical status and trough at SS 3. F/u start of additional antibiotics or additional consults  Pinkney Venard, Drake Leach 12/29/2012,4:25 PM

## 2012-12-29 NOTE — H&P (Signed)
[facility] History and Physical  [name] [name] ZOX:[id number] DOB: 09-Oct-1940 DOA: 12/29/2012   PCP: [name], MD   Chief Complaint: Shortness of breath  HPI:  72 year old male with a history of COPD (O2--2L), coronary artery disease, systolic and diastolic CHF, continued tobacco use, papillary thyroid carcinoma, CKD stage III, and hypertension presents with 2-3 days of worsening shortness of breath. He complains of cough with white sputum. Denies any hemoptysis. He denies any fevers, chills, chest discomfort, dizziness, nausea, vomiting, diarrhea, abdominal pain. His daughter is present at the bedside supplement history. His daughter relates that the patient has been more somnolent in past 1-2 days. There is no history of worsening orthopnea, PND, worsening peripheral edema. However the patient has gained approximately 2 pounds in the past week. He endorses compliance with his furosemide and other medications. He denies having to take extra Percocet. In emergency department, the patient was found to have WBC 10.3, serum creatinine 1.47, potassium 3.3, chest x-ray showing left lower lobe opacity with mother the left hemidiaphragm. There is also a nonacute fractures of third, fourth, fifth, seventh, eighth right ribs. The patient stated that he fell approximately 3 weeks ago without any syncope. The patient was given one dose of intravenous furosemide 20 mg IV with improvement of his dyspnea and emergency department. The patient was recently admitted to the hospital June 13-06/21/2014 for CHF exacerbation and between 10/15/2012-10/22/2012  For COPD exacerbation. He recently finished a course of levofloxacin yesterday. This was prescribed to him at the time of discharge in June. Assessment/Plan: Acute on chronic respiratory failure -I believe this to be multifactorial the majority of which is due to decompensated CHF -The patient has significant JVD, ProBNP 46080 -Give furosemide 40 mg IV  twice a day x2 doses and reassess in the morning -Check ABG to rule out hypercapnia his reason for his somnolence -V/Q scan -Repeat chest x-ray in the morning -I do not believe the patient has pneumonia as he has no tachycardia, no fever, and no leukocytosis--> will not start additional antibiotics Acute on chronic combine CHF -Cycle troponins -Furosemide as discussed above -[facility] has been consulted -Continue metoprolol succinate, hydralazine -10/17/2012 echocardiogram shows EF 30-35%, grade 2 diastolic dysfunction COPD -No active wheezing at this time -Continue Spiriva -Continue aerosolized albuterol -Continue Pulmicort nebulizer Papillary thyroid carcinoma -pt has been following Dr. Talmage Nap -scheduled for radioactive iodine on 01/11/13 -PET scan on 12/15/12 showed hypermetabolic supraclavicular, retropectoral, and superior mediastinal lymph nodes with increased uptake in the right lower lobe nodule -Patient is currently on a low iodine diet which will be ordered CKD stage III -stable -Baseline creatinine 1.3-1.6 Hypokalemia -Patient did not take his potassium that was ordered in the emergency department -I will reorder a one-time dose of 20 mEq of KCl -Check magnesium       Past Medical History  Diagnosis Date  . Hypertension   . Hyperlipidemia   . Peripheral vascular disease     Hx of Ax-fem BPG, known LCCA disease  . CHF (congestive heart failure)     Ischemic cardiomyopathy EF 30 - 35%  . Cancer     skin cancer, Thyroid cancer  . Stroke 2004    minor  . Dysrhythmia     PAF  . COPD (chronic obstructive pulmonary disease)     PT DENIES USES O2 2 LITERS HS  . Heart murmur     Mild-moderate MR  . Hepatitis     AT AGE  20; unknown type    .  Heart attack     03/17/12; subtotally occluded RCI with unsucessful PCI  . Coronary artery disease     Cardiologist is Dr. Nanetta Batty   . Thyroid cancer     Papillary  . Renal insufficiency    Past Surgical  History  Procedure Laterality Date  . Bypass graft  07/2010    axillobifemoral BPG  . Spine surgery  11/23/06    microdiscectomy L5-S1  . Hemorrhoid surgery    . Tonsillectomy    . Hernia repair  02/09/11    [facility]  . Cardiac catheterization      2013  . Coronary angioplasty      2013  . Thyroidectomy Bilateral 10/07/2012    Procedure: THYROIDECTOMY;  Surgeon: Darletta Moll, MD;  Location: [facility] OR;  Service: ENT;  Laterality: Bilateral;  . Radical neck dissection Left 10/07/2012    Procedure: RADICAL NECK DISSECTION;  Surgeon: Darletta Moll, MD;  Location: [facility] OR;  Service: ENT;  Laterality: Left;   Social History:  reports that he has been smoking Cigarettes.  He has a 50 pack-year smoking history. He has never used smokeless tobacco. He reports that he drinks about 4.2 ounces of alcohol per week. He reports that he does not use illicit drugs.   Family History  Problem Relation Age of Onset  . Alzheimer's disease Mother   . Cancer Father     prostate  . Cancer Brother     prostate  . Stroke Brother      Allergies  Allergen Reactions  . Ceclor (Cefaclor) Anaphylaxis    Recently took pcn for tooth problems, without issues  . Morphine And Related Other (See Comments)    Abnormal behavior      Prior to Admission medications   Medication Sig Start Date End Date Taking? Authorizing Provider  albuterol (PROVENTIL) (5 MG/ML) 0.5% nebulizer solution Take 0.5 mLs (2.5 mg total) by nebulization every 3 (three) hours as needed for wheezing or shortness of breath. 12/10/12  Yes Wilburt Finlay, PA-C  aspirin EC 81 MG tablet Take 81 mg by mouth daily.   Yes Historical Provider, MD  budesonide (PULMICORT) 0.25 MG/2ML nebulizer solution Take 2 mLs (0.25 mg total) by nebulization every 6 (six) hours. 12/10/12  Yes Wilburt Finlay, PA-C  calcitRIOL (ROCALTROL) 0.5 MCG capsule Take 0.5 mcg by mouth daily.   Yes Historical Provider, MD  calcium carbonate (OS-CAL - DOSED IN MG OF ELEMENTAL CALCIUM) 1250 MG  tablet Take 1 tablet (500 mg of elemental calcium total) by mouth 4 (four) times daily. 10/10/12  Yes Darletta Moll, MD  clopidogrel (PLAVIX) 75 MG tablet Take 75 mg by mouth daily.   Yes Historical Provider, MD  furosemide (LASIX) 40 MG tablet Take 1 tablet (40 mg total) by mouth 2 (two) times daily. 12/10/12  Yes Wilburt Finlay, PA-C  gabapentin (NEURONTIN) 600 MG tablet Take 600 mg by mouth 2 (two) times daily.    Yes Historical Provider, MD  hydrALAZINE (APRESOLINE) 25 MG tablet Take 3 tablets (75 mg total) by mouth 2 (two) times daily. 12/16/12  Yes Wilburt Finlay, PA-C  isosorbide mononitrate (IMDUR) 30 MG 24 hr tablet Take 1 tablet (30 mg total) by mouth daily. 04/06/12  Yes Nada Boozer, NP  levothyroxine (SYNTHROID, LEVOTHROID) 125 MCG tablet Take 125 mcg by mouth daily before breakfast.   Yes Historical Provider, MD  Maltodextrin-Xanthan Gum (RESOURCE THICKENUP CLEAR) POWD As directed on the packaging. 12/10/12  Yes Wilburt Finlay, PA-C  metoprolol succinate (  TOPROL-XL) 25 MG 24 hr tablet Take 25 mg by mouth daily.   Yes Historical Provider, MD  nitroGLYCERIN (NITROSTAT) 0.4 MG SL tablet Place 1 tablet (0.4 mg total) under the tongue every 5 (five) minutes x 3 doses as needed for chest pain. 04/06/12  Yes Nada Boozer, NP  oxyCODONE-acetaminophen (PERCOCET/ROXICET) 5-325 MG per tablet Take 1-2 tablets by mouth every 4 (four) hours as needed for pain. 10/10/12  Yes Darletta Moll, MD  pantoprazole (PROTONIX) 40 MG tablet Take 1 tablet (40 mg total) by mouth daily at 12 noon. 04/06/12  Yes Nada Boozer, NP  potassium chloride (K-DUR,KLOR-CON) 10 MEQ tablet Take 10 mEq by mouth daily.   Yes Historical Provider, MD  tiotropium (SPIRIVA) 18 MCG inhalation capsule Place 1 capsule (18 mcg total) into inhaler and inhale daily. 10/22/12  Yes Penny Pia, MD    Review of Systems:  Constitutional:  No weight loss, night sweats, Fevers, chills, fatigue.  Head&Eyes: No headache.  No vision loss.  No eye pain or  scotoma ENT:  No Difficulty swallowing,Tooth/dental problems,Sore throat,  No ear ache, post nasal drip,  Cardio-vascular:  No chest pain, Orthopnea, PND, swelling in lower extremities,  dizziness, palpitations  GI:  No  abdominal pain, nausea, vomiting, diarrhea, loss of appetite, hematochezia, melena, heartburn, indigestion, Resp:  No shortness of breath with exertion or at rest. No cough. No coughing up of blood .No wheezing.No chest wall deformity  Skin:  no rash or lesions.  GU:  no dysuria, change in color of urine, no urgency or frequency. No flank pain.  Musculoskeletal:  No joint pain or swelling. No decreased range of motion. No back pain.  Psych:  No change in mood or affect. No depression or anxiety. Neurologic: No headache, no dysesthesia, no focal weakness, no vision loss. No syncope  Physical Exam: Filed Vitals:   12/29/12 1530 12/29/12 1600 12/29/12 1645 12/29/12 1700  BP: 176/85 174/79 164/97 159/94  Pulse: 80 73 70 88  Temp:      TempSrc:      Resp: 23 21 24 23   Height:      Weight:      SpO2: 89% 90% 94% 95%   General:  A&O x 2, NAD, nontoxic, pleasant/cooperative Head/Eye: No conjunctival hemorrhage, no icterus, [place]/AT, No nystagmus ENT:  No icterus,  No thrush, good dentition, no pharyngeal exudate Neck: +thyroid mass CV:  RRR, no rub, no gallop, no S3 Lung:  Scattered bilateral rales, no wheezing. Good air movement. Abdomen: soft/NT, +BS, nondistended, no peritoneal signs Ext: No cyanosis, No rashes, No petechiae, No lymphangitis, No edema   Labs on Admission:  Basic Metabolic Panel:  Recent Labs Lab 12/29/12 1421  NA 137  K 3.3*  CL 90*  CO2 36*  GLUCOSE 87  BUN 25*  CREATININE 1.47*  CALCIUM 10.9*   Liver Function Tests: No results found for this basename: AST, ALT, ALKPHOS, BILITOT, PROT, ALBUMIN,  in the last 168 hours No results found for this basename: LIPASE, AMYLASE,  in the last 168 hours No results found for this basename:  AMMONIA,  in the last 168 hours CBC:  Recent Labs Lab 12/29/12 1421  WBC 10.3  NEUTROABS 7.4  HGB 9.3*  HCT 29.8*  MCV 82.3  PLT 358   Cardiac Enzymes:  Recent Labs Lab 12/29/12 1426  TROPONINI <0.30   BNP: No components found with this basename: POCBNP,  CBG: No results found for this basename: GLUCAP,  in the last 168 hours  Radiological Exams on Admission: Dg Chest 2 View  12/29/2012   *RADIOLOGY REPORT*  Clinical Data: Cough, congestion, wheezing.  Thyroid cancer.  CHEST - 2 VIEW  Comparison: 12/07/2012.  Findings: Chronic cardiopericardial enlargement.  The mediastinal contours are distorted by rightward rotation.  There is extensive aortic calcification.  Abnormal left lower lobe aeration, with elevation of the diaphragm. Morphology of the lower lung may represent subpulmonic pleural fluid.  Chronic coarsening of interstitial markings, likely chronic lung disease rather than edema.  Small pulmonary nodules noted on previous PET-CT not clearly seen today. Right third, fourth, fifth, seventh, and eighth rib fractures, present on previous PET imaging.  IMPRESSION: 1.  Abnormal aeration at the left base, suspicious for pneumonia with small pleural effusion. 2. Numerous nonacute, but potentially unhealed upper right rib fractures.   Original Report Authenticated By: Tiburcio Pea    EKG: Independently reviewed. Sinus rhythm, nonspecific ST-T wave change, first-degree AVB    Time spent:70 minutes Code Status:   DNR Family Communication:   daughter at bedside   Liat Mayol, DO  [facility] Pager [phone]  If 7PM-7AM, please contact night-coverage [email or web address] Password [facility] 12/29/2012, 5:42 PM

## 2012-12-29 NOTE — ED Notes (Signed)
Pt presents with onset of shortness of breath and frothy productive cough that has worsened since last night.  Pt reports onset of R upper back pain since last night as well.  Pt having to use home O2 all the time.  Family reports pt's O2 sat was 81 x 1 hour ago.

## 2012-12-29 NOTE — Telephone Encounter (Signed)
Returned call to [employer].  Stated pt has been very sleepy, confused, dropping things and falling asleep a lot.  Stated his oxygen level is dropping to 82-84 when he is not wearing his oxygen, which he has to wear all of the time.  Stated the last time pt was like this his Na+ was low and he ended up in the hospital.  Stated she thinks they are going to just take him to the ER.  Traci advised to take pt to ER now for evaluation and treatment.  Also informed Dr. Allyson Sabal will be notified he is going.  French Ana verbalized understanding and agreed w/ plan.  Stated she will take pt to [facility] ER.  Message forwarded to Dr. Allyson Sabal for review.  Paper chart# [id number]

## 2012-12-30 ENCOUNTER — Inpatient Hospital Stay (HOSPITAL_COMMUNITY): Payer: Medicare Other

## 2012-12-30 LAB — BLOOD GAS, ARTERIAL
Acid-Base Excess: 12.9 mmol/L — ABNORMAL HIGH (ref 0.0–2.0)
Bicarbonate: 37.6 mEq/L — ABNORMAL HIGH (ref 20.0–24.0)
TCO2: 39.3 mmol/L (ref 0–100)
pCO2 arterial: 54.7 mmHg — ABNORMAL HIGH (ref 35.0–45.0)
pH, Arterial: 7.452 — ABNORMAL HIGH (ref 7.350–7.450)
pO2, Arterial: 63.1 mmHg — ABNORMAL LOW (ref 80.0–100.0)

## 2012-12-30 LAB — BASIC METABOLIC PANEL
BUN: 24 mg/dL — ABNORMAL HIGH (ref 6–23)
CO2: 39 mEq/L — ABNORMAL HIGH (ref 19–32)
Chloride: 92 mEq/L — ABNORMAL LOW (ref 96–112)
Creatinine, Ser: 1.45 mg/dL — ABNORMAL HIGH (ref 0.50–1.35)
Glucose, Bld: 123 mg/dL — ABNORMAL HIGH (ref 70–99)

## 2012-12-30 MED ORDER — ALBUTEROL SULFATE (5 MG/ML) 0.5% IN NEBU
2.5000 mg | INHALATION_SOLUTION | Freq: Four times a day (QID) | RESPIRATORY_TRACT | Status: DC
  Administered 2012-12-31 – 2013-01-07 (×26): 2.5 mg via RESPIRATORY_TRACT
  Filled 2012-12-30 (×29): qty 0.5

## 2012-12-30 MED ORDER — FUROSEMIDE 10 MG/ML IJ SOLN
40.0000 mg | Freq: Two times a day (BID) | INTRAMUSCULAR | Status: DC
  Administered 2012-12-31 – 2013-01-05 (×11): 40 mg via INTRAVENOUS
  Filled 2012-12-30 (×14): qty 4

## 2012-12-30 MED ORDER — TECHNETIUM TO 99M ALBUMIN AGGREGATED
6.0000 | Freq: Once | INTRAVENOUS | Status: AC | PRN
  Administered 2012-12-30: 6 via INTRAVENOUS

## 2012-12-30 MED ORDER — BUDESONIDE 0.25 MG/2ML IN SUSP
0.2500 mg | Freq: Four times a day (QID) | RESPIRATORY_TRACT | Status: DC
  Administered 2012-12-31 – 2013-01-07 (×25): 0.25 mg via RESPIRATORY_TRACT
  Filled 2012-12-30 (×34): qty 2

## 2012-12-30 MED ORDER — TECHNETIUM TC 99M DIETHYLENETRIAME-PENTAACETIC ACID: 40.0000 | Freq: Once | INTRAVENOUS | Status: AC | PRN

## 2012-12-30 MED ORDER — ENSURE PUDDING PO PUDG
1.0000 | Freq: Three times a day (TID) | ORAL | Status: DC
  Administered 2012-12-30 – 2013-01-06 (×20): 1 via ORAL

## 2012-12-30 MED ORDER — ALBUTEROL SULFATE (5 MG/ML) 0.5% IN NEBU: 2.5000 mg | INHALATION_SOLUTION | RESPIRATORY_TRACT | Status: DC | PRN

## 2012-12-30 NOTE — Progress Notes (Addendum)
[facility] PROGRESS NOTE  [name] [name] RUE:[id number] DOB: [birth year] DOA: 12/29/2012 PCP: [name], MD  Assessment/Plan: Acute on chronic respiratory failure  -I believe this to be multifactorial the majority of which is due to decompensated CHF - Pt JVD on admission and ProBNP [phone]  -improving with lasix, will change to PO -Repeat ABG this am improved and pt now more alert -V/Q scan neg for PE -No abx continued as pt just completed a full course of levaquin on 7/9, and he is afebrile with no leukocytosis>> will repeat CXR( was not done today)in am and follow Acute on chronic combine CHF  -troponins neg -continue Furosemide as above, about 1L neg overnight  -I have called back [facility] and per PA pt will be seen today -Continue metoprolol succinate, hydralazine  -10/17/2012 echocardiogram shows EF 30-35%, grade 2 diastolic dysfunction  COPD  -No active wheezing at this time  -Continue Spiriva, albuterol &Pulmicort nebulizers  -change iv to oral steroids beginning in am Papillary thyroid carcinoma  -pt has been followed by Dr. Talmage [name]  -scheduled for radioactive iodine on 01/11/13  -PET scan on 12/15/12 showed hypermetabolic supraclavicular, retropectoral, and superior mediastinal lymph nodes with increased uptake in the right lower lobe nodule  -continue low iodine diet  CKD stage III  -stable  -Baseline creatinine 1.3-1.6  Hypokalemia -resolved  Code Status: DNR Family Communication: Daughter and son at bedside Disposition Plan: to home when medically stable   Consultants:  cards  Procedures:  none  Antibiotics:  none  HPI/Subjective: Pt somnolent earlier this am, but now alert and oriented  x 3 and appropriate. He statess he is breathing better today and denies CP  Objective: Filed Vitals:   12/29/12 1927 12/29/12 2059 12/30/12 0517 12/30/12 0749  BP:  147/67 127/59   Pulse:  101 61   Temp:  98.1 F (36.7 C) 98.4 F (36.9 C)    TempSrc:  Oral Oral   Resp:  19 20   Height:      Weight:   55.702 kg (122 lb 12.8 oz)   SpO2: 95% 98% 93% 96%    Intake/Output Summary (Last 24 hours) at 12/30/12 1129 Last data filed at 12/30/12 0843  Gross per 24 hour  Intake    243 ml  Output   1201 ml  Net   -958 ml   Filed Weights   12/29/12 1358 12/29/12 1825 12/30/12 0517  Weight: 54.885 kg (121 lb) 55.792 kg (123 lb) 55.702 kg (122 lb 12.8 oz)   Exam:  General: alert & oriented X3 In NAD Cardiovascular: RRR, nl S1 s2 Respiratory: Diminished air mov't bil, no wheezes, resp unlabored Abdomen: soft +BS NT/ND, no masses palpable Extremities: No cyanosis and no edema    Data Reviewed: Basic Metabolic Panel:  Recent Labs Lab 12/29/12 1421 12/30/12 0157  NA 137 139  K 3.3* 3.9  CL 90* 92*  CO2 36* 39*  GLUCOSE 87 123*  BUN 25* 24*  CREATININE 1.47* 1.45*  CALCIUM 10.9* 10.6*   Liver Function Tests: No results found for this basename: AST, ALT, ALKPHOS, BILITOT, PROT, ALBUMIN,  in the last 168 hours No results found for this basename: LIPASE, AMYLASE,  in the last 168 hours No results found for this basename: AMMONIA,  in the last 168 hours CBC:  Recent Labs Lab 12/29/12 1421  WBC 10.3  NEUTROABS 7.4  HGB 9.3*  HCT 29.8*  MCV 82.3  PLT 358   Cardiac Enzymes:  Recent Labs Lab 12/29/12  1426 12/30/12 0012  TROPONINI <0.30 <0.30   BNP (last 3 results)  Recent Labs  12/02/12 1939 12/05/12 0520 12/29/12 1426  PROBNP 64029.0* 30416.0* 46080.0*   CBG: No results found for this basename: GLUCAP,  in the last 168 hours  Recent Results (from the past 240 hour(s))  CULTURE, BLOOD (ROUTINE X 2)     Status: None   Collection Time    12/29/12  3:57 PM      Result Value Range Status   Specimen Description BLOOD HAND RIGHT   Final   Special Requests BOTTLES DRAWN AEROBIC ONLY 2.5CC   Final   Culture  Setup Time 12/29/2012 23:13   Final   Culture     Final   Value:        BLOOD CULTURE RECEIVED  NO GROWTH TO DATE CULTURE WILL BE HELD FOR 5 DAYS BEFORE ISSUING A FINAL NEGATIVE REPORT   Report Status PENDING   Incomplete  CULTURE, BLOOD (ROUTINE X 2)     Status: None   Collection Time    12/29/12  4:03 PM      Result Value Range Status   Specimen Description BLOOD ARM LEFT   Final   Special Requests BOTTLES DRAWN AEROBIC ONLY 10CC   Final   Culture  Setup Time 12/29/2012 23:13   Final   Culture     Final   Value:        BLOOD CULTURE RECEIVED NO GROWTH TO DATE CULTURE WILL BE HELD FOR 5 DAYS BEFORE ISSUING A FINAL NEGATIVE REPORT   Report Status PENDING   Incomplete     Studies: Dg Chest 2 View  12/29/2012   *RADIOLOGY REPORT*  Clinical Data: Cough, congestion, wheezing.  Thyroid cancer.  CHEST - 2 VIEW  Comparison: 12/07/2012.  Findings: Chronic cardiopericardial enlargement.  The mediastinal contours are distorted by rightward rotation.  There is extensive aortic calcification.  Abnormal left lower lobe aeration, with elevation of the diaphragm. Morphology of the lower lung may represent subpulmonic pleural fluid.  Chronic coarsening of interstitial markings, likely chronic lung disease rather than edema.  Small pulmonary nodules noted on previous PET-CT not clearly seen today. Right third, fourth, fifth, seventh, and eighth rib fractures, present on previous PET imaging.  IMPRESSION: 1.  Abnormal aeration at the left base, suspicious for pneumonia with small pleural effusion. 2. Numerous nonacute, but potentially unhealed upper right rib fractures.   Original Report Authenticated By: Tiburcio Pea    Scheduled Meds: . albuterol  2.5 mg Nebulization Q6H  . aspirin EC  81 mg Oral Daily  . budesonide  0.25 mg Nebulization Q6H  . calcitRIOL  0.5 mcg Oral Daily  . calcium carbonate  1 tablet Oral TID WC & HS  . clopidogrel  75 mg Oral Daily  . furosemide  40 mg Intravenous BID  . heparin  5,000 Units Subcutaneous Q8H  . hydrALAZINE  75 mg Oral BID  . isosorbide mononitrate  30 mg  Oral Daily  . levothyroxine  125 mcg Oral QAC breakfast  . methylPREDNISolone (SOLU-MEDROL) injection  60 mg Intravenous Q12H  . metoprolol succinate  25 mg Oral Daily  . pantoprazole  40 mg Oral Q1200  . potassium chloride  10 mEq Oral Daily  . potassium chloride  40 mEq Oral Once  . sodium chloride  3 mL Intravenous Q12H  . tiotropium  18 mcg Inhalation Daily  . vancomycin  750 mg Intravenous Q24H   Continuous Infusions:   Active Problems:   *  No active hospital problems. *    Time spent: 38    [facility]  [facility] Pager [phone]. If 7PM-7AM, please contact night-coverage at [email or web address], password [email or web address] 12/30/2012, 11:29 AM  LOS: 1 day

## 2012-12-30 NOTE — Progress Notes (Signed)
In order to continue monitoring accurate   urine output spoke with Dr. Suanne Marker and obtained order for foley as noted pt to be much sleepy late this am as oppose to earlier when he was awake .  MD instructed it was ok to insert foley.  Will continue to monitor.  Amanda Pea, Charity fundraiser.

## 2012-12-30 NOTE — Progress Notes (Signed)
CSW spoke with patient and family 3 different times today for support and information.  Family requested further information about [facility] and were disappointed to find that patient's insurance will not cover LTAC.  They are agreeable to SNF and are very hopeful for placement at [facility].  Referral has been sent to [facility]; CSW will attempt to facilitate [facility] when medically stable.  CSW provided support and encouragement; patient is now on a ventimask.  CSW will monitor.  Lorri Frederick. [place]  [phone]

## 2012-12-30 NOTE — Care Management Note (Unsigned)
    Page 1 of 1   12/30/2012     5:28:55 PM   CARE MANAGEMENT NOTE 12/30/2012  Patient:  [name] [name]   Account Number:  [id number]  Date Initiated:  12/30/2012  Documentation initiated by:  Tera Mater  Subjective/Objective Assessment:   72yo male admitted with PNA.  Pt. lives at home with granddaughter.     Action/Plan:   discharge planning   Anticipated DC Date:  12/31/2012   Anticipated DC Plan:  HOME W HOME HEALTH SERVICES      DC Planning Services  CM consult      [facility] Choice  Resumption Of Svcs/PTA Provider   Choice offered to / List presented to:             [facility] agency  [facility]   Status of service:  In process, will continue to follow Medicare Important Message given?   (If response is "NO", the following Medicare IM given date fields will be blank) Date Medicare IM given:   Date Additional Medicare IM given:    Discharge Disposition:    Per UR Regulation:  Reviewed for med. necessity/level of care/duration of stay  If discussed at Long Length of Stay Meetings, dates discussed:    Comments:  12/30/12 1705 In to complete heart failure home health screen.  Pt. states he is currently being seen by [facility] for [facility] RN.  Will obtain resumption of care orders. Tera Mater, RN, BSN NCM [phone]

## 2012-12-30 NOTE — Progress Notes (Signed)
Attempted to d/c pt's foley cath.  Pt states"I want to leave it in until in the morning."  On coming nurse Ronnie made aware.  Amanda Pea, Charity fundraiser.

## 2012-12-30 NOTE — Progress Notes (Signed)
We will see the patient in the morning 12/31/12.  Call if any questions overnight.

## 2012-12-30 NOTE — Progress Notes (Addendum)
Instructed by Dr. Suanne Marker to inform daughter she is coming to talk with her regarding [facility].   MD spoke with daughter.

## 2012-12-30 NOTE — Progress Notes (Signed)
Noted pt asleep arousable with sternal rub.  Daughter Trace and son at the bedside.  BP 212/67, P71, 02 sat92 % on RA. RA.  Will continue to monitor.  Amanda Pea, Charity fundraiser.

## 2012-12-30 NOTE — Progress Notes (Addendum)
INITIAL NUTRITION ASSESSMENT  DOCUMENTATION CODES Per approved criteria  -Severe malnutrition in the context of chronic illness   INTERVENTION: 1. Ensure Pudding po TID, each supplement provides 170 kcal and 4 grams of protein.   2. Recommend oral nutrition supplements continue after d/c.   3. Recommend d/c iodine restriction in diet order, per family the procedure this was required for has been rescheduled.   NUTRITION DIAGNOSIS: Inadequate oral intake related to poor appetite and increase needs as evidenced by weight loss.   Goal: PO intake to meet >/=90% estimated nutrition needs.   Monitor:  PO intake, weight trends, labs  Reason for Assessment: Malnutrition Screening Tool  72 y.o. male  Admitting Dx: SOB   ASSESSMENT: Pt with recent admission, now back with increased SOB with cough with sputum production.  Pt with a hx of weight loss, per family in room pt has been more stable but remains less then his usual weight. Pt has not been eating well at home, continues to eat multiple meals per day but much small portions. At last admission pt was consuming Ensure pudding, this was not continued post d/c. Pt is on D3 with nectar thickened liquids per most recent swallow evaluation. Currently also has an Iodine restriction as pt was planned on an OP procedure that required this, but family now reports this will be rescheduled and iodine restriction is not necessary at this time.   Nutrition Focused Physical Exam:  Subcutaneous Fat:  Orbital Region: WNL Upper Arm Region: mild depletion Thoracic and Lumbar Region: mild depletion  Muscle:  Temple Region: WNL Clavicle Bone Region: moderate-severe depletion Clavicle and Acromion Bone Region: moderate-severe depletion Scapular Bone Region: n/a Dorsal Hand: n/a Patellar Region: severe depletion Anterior Thigh Region: severe depletion Posterior Calf Region: severe depletion   Edema: none   Pt meets criteria for severe  malnutrition in the context of chronic illness 2/2 severe muscle wasting, weight loss of 15% body weight in the past 3 months, and meeting </=75% estimated nutrition needs for >/= 1 month.    Height: Ht Readings from Last 1 Encounters:  12/29/12 5\' 6"  (1.676 m)    Weight: Wt Readings from Last 1 Encounters:  12/30/12 122 lb 12.8 oz (55.702 kg)    Ideal Body Weight: 142 lbs   % Ideal Body Weight: 86%  Wt Readings from Last 10 Encounters:  12/30/12 122 lb 12.8 oz (55.702 kg)  12/16/12 125 lb 9.6 oz (56.972 kg)  12/10/12 124 lb 14.4 oz (56.654 kg)  10/22/12 138 lb 3.2 oz (62.687 kg)  10/07/12 143 lb 4.8 oz (65 kg)  10/07/12 143 lb 4.8 oz (65 kg)  10/04/12 141 lb 9.6 oz (64.229 kg)  09/29/12 142 lb (64.411 kg)  06/09/12 135 lb 2.3 oz (61.3 kg)  05/18/12 122 lb 12.7 oz (55.7 kg)    Usual Body Weight: 143 lbs   % Usual Body Weight: 85%  BMI:  Body mass index is 19.83 kg/(m^2). WNL   Estimated Nutritional Needs: Kcal: 1700-1900 Protein: 80-90 gm  Fluid: 1.7-1.9 L   Skin: intact   Diet Order: Dysphagia 3, nectar thick liquids   EDUCATION NEEDS: -No education needs identified at this time   Intake/Output Summary (Last 24 hours) at 12/30/12 1411 Last data filed at 12/30/12 0843  Gross per 24 hour  Intake    243 ml  Output   1201 ml  Net   -958 ml    Last BM: PTA    Labs:   Recent Labs  Lab 12/29/12 1421 12/30/12 0157  NA 137 139  K 3.3* 3.9  CL 90* 92*  CO2 36* 39*  BUN 25* 24*  CREATININE 1.47* 1.45*  CALCIUM 10.9* 10.6*  GLUCOSE 87 123*    CBG (last 3)  No results found for this basename: GLUCAP,  in the last 72 hours  Scheduled Meds: . albuterol  2.5 mg Nebulization Q6H  . aspirin EC  81 mg Oral Daily  . budesonide  0.25 mg Nebulization Q6H  . calcitRIOL  0.5 mcg Oral Daily  . calcium carbonate  1 tablet Oral TID WC & HS  . clopidogrel  75 mg Oral Daily  . furosemide  40 mg Intravenous BID  . heparin  5,000 Units Subcutaneous Q8H  .  hydrALAZINE  75 mg Oral BID  . isosorbide mononitrate  30 mg Oral Daily  . levothyroxine  125 mcg Oral QAC breakfast  . methylPREDNISolone (SOLU-MEDROL) injection  60 mg Intravenous Q12H  . metoprolol succinate  25 mg Oral Daily  . pantoprazole  40 mg Oral Q1200  . potassium chloride  10 mEq Oral Daily  . potassium chloride  40 mEq Oral Once  . sodium chloride  3 mL Intravenous Q12H  . tiotropium  18 mcg Inhalation Daily  . vancomycin  750 mg Intravenous Q24H    Continuous Infusions:   Past Medical History  Diagnosis Date  . Hypertension   . Hyperlipidemia   . Peripheral vascular disease     Hx of Ax-fem BPG, known LCCA disease  . CHF (congestive heart failure)     Ischemic cardiomyopathy EF 30 - 35%  . Cancer     skin cancer, Thyroid cancer  . Stroke 2004    minor  . Dysrhythmia     PAF  . COPD (chronic obstructive pulmonary disease)     PT DENIES USES O2 2 LITERS HS  . Heart murmur     Mild-moderate MR  . Hepatitis     AT AGE  60; unknown type    . Heart attack     03/17/12; subtotally occluded RCI with unsucessful PCI  . Coronary artery disease     Cardiologist is Dr. Nanetta Batty   . Thyroid cancer     Papillary  . Renal insufficiency     Past Surgical History  Procedure Laterality Date  . Bypass graft  07/2010    axillobifemoral BPG  . Spine surgery  11/23/06    microdiscectomy L5-S1  . Hemorrhoid surgery    . Tonsillectomy    . Hernia repair  02/09/11    Ventral  . Cardiac catheterization      2013  . Coronary angioplasty      2013  . Thyroidectomy Bilateral 10/07/2012    Procedure: THYROIDECTOMY;  Surgeon: Darletta Moll, MD;  Location: [facility] OR;  Service: ENT;  Laterality: Bilateral;  . Radical neck dissection Left 10/07/2012    Procedure: RADICAL NECK DISSECTION;  Surgeon: Darletta Moll, MD;  Location: [facility] OR;  Service: ENT;  Laterality: Left;    Clarene Duke RD, LDN Pager [phone] After Hours pager [phone]

## 2012-12-31 ENCOUNTER — Encounter (HOSPITAL_COMMUNITY): Payer: Self-pay | Admitting: Cardiology

## 2012-12-31 ENCOUNTER — Inpatient Hospital Stay (HOSPITAL_COMMUNITY): Payer: Medicare Other

## 2012-12-31 DIAGNOSIS — F17200 Nicotine dependence, unspecified, uncomplicated: Secondary | ICD-10-CM

## 2012-12-31 DIAGNOSIS — J189 Pneumonia, unspecified organism: Secondary | ICD-10-CM

## 2012-12-31 DIAGNOSIS — R0602 Shortness of breath: Secondary | ICD-10-CM | POA: Diagnosis present

## 2012-12-31 LAB — BASIC METABOLIC PANEL
CO2: 38 mEq/L — ABNORMAL HIGH (ref 19–32)
Glucose, Bld: 140 mg/dL — ABNORMAL HIGH (ref 70–99)
Potassium: 3.6 mEq/L (ref 3.5–5.1)
Sodium: 132 mEq/L — ABNORMAL LOW (ref 135–145)

## 2012-12-31 MED ORDER — DIGOXIN 0.0625 MG HALF TABLET
0.0625 mg | ORAL_TABLET | Freq: Every day | ORAL | Status: DC
  Administered 2012-12-31 – 2013-01-07 (×8): 0.0625 mg via ORAL
  Filled 2012-12-31 (×8): qty 1

## 2012-12-31 MED ORDER — PREDNISONE 50 MG PO TABS
60.0000 mg | ORAL_TABLET | Freq: Every day | ORAL | Status: DC
  Administered 2012-12-31 – 2013-01-02 (×3): 60 mg via ORAL
  Filled 2012-12-31 (×4): qty 1

## 2012-12-31 MED ORDER — ISOSORBIDE MONONITRATE ER 30 MG PO TB24
45.0000 mg | ORAL_TABLET | Freq: Every day | ORAL | Status: DC
  Administered 2012-12-31 – 2013-01-01 (×2): 45 mg via ORAL
  Filled 2012-12-31 (×2): qty 1

## 2012-12-31 MED ORDER — HYDRALAZINE HCL 50 MG PO TABS
75.0000 mg | ORAL_TABLET | Freq: Three times a day (TID) | ORAL | Status: DC
  Administered 2012-12-31 – 2013-01-07 (×22): 75 mg via ORAL
  Filled 2012-12-31 (×29): qty 1

## 2012-12-31 MED ORDER — PANTOPRAZOLE SODIUM 40 MG PO TBEC
40.0000 mg | DELAYED_RELEASE_TABLET | Freq: Every day | ORAL | Status: DC
  Administered 2012-12-31 – 2013-01-07 (×8): 40 mg via ORAL
  Filled 2012-12-31 (×9): qty 1

## 2012-12-31 NOTE — Progress Notes (Signed)
[facility] PROGRESS NOTE  [name] [name] ZOX:[id number] DOB: 01-Oct-1940 DOA: 12/29/2012 PCP: [name], MD  Assessment/Plan: Acute on chronic respiratory failure  -I believe this to be multifactorial the majority of which is due to decompensated CHF - Pt JVD on admission and ProBNP [phone]  -improving with lasix, cards continuing IV lasix today -Repeat ABG this am improved and pt now more alert -V/Q scan neg for PE -No abx continued as pt just completed a full course of levaquin on 7/9, and he is afebrile with no leukocytosis>> repeat CXR uchanged from7/11- likely residual from recent PNA- Pt with no significant cough, he isafebrile with no leukocytosis 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 Talmage Nap  -scheduled for radioactive iodine on 01/11/13  -PET scan on 12/15/12 showed hypermetabolic supraclavicular, retropectoral, and superior mediastinal lymph nodes with increased uptake in the right lower lobe nodule  -continue low iodine diet  CKD stage III  -stable  -Baseline creatinine 1.3-1.6  Hypokalemia -resolved  Code Status: DNR Family Communication: Daughter and son at bedside Disposition Plan: to home when medically stable   Consultants:  cards  Procedures:  none  Antibiotics:  none  HPI/Subjective: Pt  alert and oriented  x 3 and appropriate. states breathing better today and denies CP  Objective: Filed Vitals:   12/31/12 0812 12/31/12 0816 12/31/12 1151 12/31/12 1409  BP:    133/74  Pulse:    70  Temp:    98 F (36.7 C)   TempSrc:    Oral  Resp:    17  Height:      Weight:      SpO2: 100% 100% 97% 93%    Intake/Output Summary (Last 24 hours) at 12/31/12 1411 Last data filed at 12/31/12 1010  Gross per 24 hour  Intake   1090 ml  Output   1250 ml  Net   -160 ml   Filed Weights   12/29/12 1825 12/30/12 0517 12/31/12 0505  Weight: 55.792 kg (123 lb) 55.702 kg (122 lb 12.8 oz) 56.518 kg (124 lb 9.6 oz)   Exam:  General: alert & oriented X3 In NAD Cardiovascular: RRR, nl S1 s2 Respiratory: Diminished air mov't bil, no wheezes, resp unlabored Abdomen: soft +BS NT/ND, no masses palpable Extremities: No cyanosis and no edema    Data Reviewed: Basic Metabolic Panel:  Recent Labs Lab 12/29/12 1421 12/30/12 0157 12/31/12 0425  NA 137 139 132*  K 3.3* 3.9 3.6  CL 90* 92* 88*  CO2 36* 39* 38*  GLUCOSE 87 123* 140*  BUN 25* 24* 33*  CREATININE 1.47* 1.45* 1.40*  CALCIUM 10.9* 10.6* 10.1   Liver Function Tests: No results found for this basename: AST, ALT, ALKPHOS, BILITOT, PROT, ALBUMIN,  in the last 168 hours No results found for this basename: LIPASE, AMYLASE,  in the last 168 hours No results found for this basename: AMMONIA,  in the last 168 hours CBC:  Recent Labs Lab 12/29/12 1421  WBC 10.3  NEUTROABS 7.4  HGB 9.3*  HCT 29.8*  MCV 82.3  PLT 358   Cardiac Enzymes:  Recent Labs Lab 12/29/12 1426 12/30/12 0012  TROPONINI <0.30 <0.30   BNP (last 3 results)  Recent Labs  12/02/12 1939 12/05/12 0520 12/29/12 1426  PROBNP 64029.0* 30416.0* 46080.0*   CBG: No results found for this basename: GLUCAP,  in the last 168 hours  Recent Results (from the past 240 hour(s))  CULTURE, BLOOD (ROUTINE X 2)     Status: None   Collection Time    12/29/12  3:57 PM      Result Value Range Status   Specimen Description BLOOD HAND RIGHT   Final   Special Requests BOTTLES DRAWN AEROBIC ONLY 2.5CC   Final   Culture  Setup Time 12/29/2012 23:13   Final   Culture     Final   Value:         BLOOD CULTURE RECEIVED NO GROWTH TO DATE CULTURE WILL BE HELD FOR 5 DAYS BEFORE ISSUING A FINAL NEGATIVE REPORT   Report Status PENDING   Incomplete  CULTURE, BLOOD (ROUTINE X 2)     Status: None   Collection Time    12/29/12  4:03 PM      Result Value Range Status   Specimen Description BLOOD ARM LEFT   Final   Special Requests BOTTLES DRAWN AEROBIC ONLY 10CC   Final   Culture  Setup Time 12/29/2012 23:13   Final   Culture     Final   Value:        BLOOD CULTURE RECEIVED NO GROWTH TO DATE CULTURE WILL BE HELD FOR 5 DAYS BEFORE ISSUING A FINAL NEGATIVE REPORT   Report Status PENDING   Incomplete     Studies: Dg Chest 2 View  12/31/2012   *RADIOLOGY REPORT*  Clinical Data: Shortness of breath and findings suggestive of left lower lobe infiltrate by prior chest x-ray.  CHEST - 2 VIEW  Comparison: 12/29/2012  Findings: There is suggestion of left lower lobe infiltrate with associated small left pleural effusion.  Underlying significant COPD/chronic lung disease again noted.  The heart size is within normal limits.  Stable osteopenia of the thoracic spine with several compression fractures identified.  IMPRESSION: Persistent suggestion of left lower lobe infiltrate with associated small left pleural effusion.   Original Report Authenticated By: Irish Lack, M.D.   Dg Chest 2 View  12/29/2012   *RADIOLOGY REPORT*  Clinical Data: Cough, congestion, wheezing.  Thyroid cancer.  CHEST - 2 VIEW  Comparison: 12/07/2012.  Findings: Chronic cardiopericardial enlargement.  The mediastinal contours are distorted by rightward rotation.  There is extensive aortic calcification.  Abnormal left lower lobe aeration, with elevation of the diaphragm. Morphology of the lower lung may represent subpulmonic pleural fluid.  Chronic coarsening of interstitial markings, likely chronic lung disease rather than edema.  Small pulmonary nodules noted on previous PET-CT not clearly seen today. Right third, fourth, fifth,  seventh, and eighth rib fractures, present on previous PET imaging.  IMPRESSION: 1.  Abnormal aeration at the left base, suspicious for pneumonia with small pleural effusion. 2. Numerous nonacute, but potentially unhealed upper right rib fractures.   Original Report Authenticated By: Tiburcio Pea   Nm Pulmonary Perf And Vent  12/30/2012   *RADIOLOGY REPORT*  Clinical Data:  Shortness of breath with wheezing.  Papillary thyroid cancer.  Question pulmonary embolism.  NUCLEAR MEDICINE VENTILATION - PERFUSION LUNG SCAN  Technique:  Ventilation images were obtained in multiple projections using inhaled aerosol technetium 99 M DTPA.  Perfusion images were obtained in multiple projections after intravenous injection of Tc-46m MAA.  Radiopharmaceuticals:  Tc-19m DTPA  aerosol and 6.0 mCi Tc-63m MAA.  Comparison: Chest radiographs 12/29/2012.  PET CT 12/15/2012.  Findings:  Ventilation:  There is prominent central clumping of the aerosol within the trachea and proximal bronchi in asymmetric to the left. There are patchy ventilatory defects in both lungs suggesting underlying obstructive lung disease.  Free pertechnetate is noted within the stomach.  Perfusion:  The perfusion appears matched to the ventilatory examination.  There are no perfusion defects to suggest pulmonary embolism.  IMPRESSION:  1.  Low probability for acute pulmonary embolism. 2.  Central aerosol clumping and patchy ventilation suggesting obstructive lung disease.   Original Report Authenticated By: Carey Bullocks, M.D.    Scheduled Meds: . albuterol  2.5 mg Nebulization QID  . aspirin EC  81 mg Oral Daily  . budesonide  0.25 mg Nebulization QID  . calcitRIOL  0.5 mcg Oral Daily  . calcium carbonate  1 tablet Oral TID WC & HS  . clopidogrel  75 mg Oral Daily  . digoxin  0.0625 mg Oral Daily  . feeding supplement  1 Container Oral TID BM  . furosemide  40 mg Intravenous BID  . heparin  5,000 Units Subcutaneous Q8H  . hydrALAZINE  75  mg Oral TID  . isosorbide mononitrate  45 mg Oral Daily  . levothyroxine  125 mcg Oral QAC breakfast  . metoprolol succinate  25 mg Oral Daily  . pantoprazole  40 mg Oral QAC breakfast  . potassium chloride  10 mEq Oral Daily  . potassium chloride  40 mEq Oral Once  . predniSONE  60 mg Oral Q breakfast  . sodium chloride  3 mL Intravenous Q12H  . tiotropium  18 mcg Inhalation Daily   Continuous Infusions:   Principal Problem:   Acute and chronic respiratory failure Active Problems:   COPD (chronic obstructive pulmonary disease)   Ischemic cardiomyopathy, EF 30-35% by echo 03/29/12 - improved to 50-55% by 05/2012, but back to 35% 09/2012   Pericardial effusion, moderate on echo 03/29/12, continues to be moderate this admit   Malignant neoplasm of thyroid gland- surgey April 2014   CAD, unsuccessful RCA PCI Sept 2013   Chronic renal disease, stage III   SOB (shortness of breath)    Time spent: 25    [facility] [address]  [facility] Pager [phone]. If 7PM-7AM, please contact night-coverage at [email or web address], password [facility] 12/31/2012, 2:11 PM  LOS: 2 days

## 2012-12-31 NOTE — Progress Notes (Signed)
HEP SQ

## 2012-12-31 NOTE — Consult Note (Signed)
Reason for Consult: CHF/SOB   Referring Physician:TRH  PCP:  [name] Patch, MD Primary Cardiologist:[name] [name] [name] [name] is an 72 y.o. male.    Chief Complaint:  Admitted 12/29/12 with SOB.  HPI:  72 year old male with a history of COPD (O2--2L), coronary artery disease, systolic and diastolic CHF, continued tobacco use, papillary thyroid carcinoma, CKD stage III, and hypertension presents with 2-3 days of worsening shortness of breath. He complained of cough with white sputum. Denied any hemoptysis. He denied any fevers, chills, chest discomfort, dizziness, nausea, vomiting, diarrhea, abdominal pain. His daughter is present at the bedside supplement history. His daughter relates that the patient has been more somnolent in past 1-2 days. There is no history of worsening orthopnea, PND, worsening peripheral edema. However the patient has gained approximately 2 pounds in the past week. He endorses compliance with his furosemide and other medications. He denies having to take extra Percocet. In emergency department, the patient was found to have WBC 10.3, serum creatinine 1.47, potassium 3.3, chest x-ray showing left lower lobe opacity with mother the left hemidiaphragm. There is also a nonacute fractures of third, fourth, fifth, seventh, eighth right ribs. The patient stated that he fell approximately 3 weeks ago without any syncope. The patient was given one dose of intravenous furosemide 20 mg IV with improvement of his dyspnea and emergency department. The patient was recently admitted to the hospital June 13-06/21/2014 for CHF exacerbation and between 10/15/2012-10/22/2012 For COPD exacerbation. He recently finished a course of levofloxacin yesterday.  Since admit he feels much better.  Troponin was negative.  Pro BNP 46,080 at discharge on last admit he was 30,416. He had diureses with lasix IV one dose 40 mg.  Initially they thought this was pneumonia  But no fever CXR without new  infiltrate, VQ scan negative for PE-low probability. Still lethargic at times.    Past Medical History  Diagnosis Date  . Hypertension   . Hyperlipidemia   . Peripheral vascular disease     Hx of Ax-fem BPG, known LCCA disease  . CHF (congestive heart failure)     Ischemic cardiomyopathy EF 30 - 35%  . Cancer     skin cancer, Thyroid cancer  . Stroke 2004    minor  . Dysrhythmia     PAF  . COPD (chronic obstructive pulmonary disease)     PT DENIES USES O2 2 LITERS HS  . Heart murmur     Mild-moderate MR  . Hepatitis     AT AGE  5; unknown type    . Heart attack     03/17/12; subtotally occluded RCI with unsucessful PCI  . Coronary artery disease     Cardiologist is [name] [name]   . Thyroid cancer     Papillary  . Renal insufficiency     Past Surgical History  Procedure Laterality Date  . Bypass graft  07/2010    axillobifemoral BPG  . Spine surgery  11/23/06    microdiscectomy L5-S1  . Hemorrhoid surgery    . Tonsillectomy    . Hernia repair  02/09/11    Ventral  . Cardiac catheterization      2013  . Coronary angioplasty      2013  . Thyroidectomy Bilateral 10/07/2012    Procedure: THYROIDECTOMY;  Surgeon: [name] Moll, MD;  Location: [facility];  Service: ENT;  Laterality: Bilateral;  . Radical neck dissection Left 10/07/2012    Procedure: RADICAL NECK DISSECTION;  Surgeon:  [name] Moll, MD;  Location: [facility] OR;  Service: ENT;  Laterality: Left;    Family History  Problem Relation Age of Onset  . Alzheimer's disease Mother   . Heart failure Mother   . Heart disease Mother   . Cancer Father     prostate  . Heart disease Father   . Heart attack Father   . Cancer Brother     prostate  . Stroke Brother    Social History:  reports that he has been smoking Cigarettes.  He has a 50 pack-year smoking history. He has never used smokeless tobacco. He reports that he drinks about 4.2 ounces of alcohol per week. He reports that he does not use illicit drugs.  Allergies:    Allergies  Allergen Reactions  . Ceclor (Cefaclor) Anaphylaxis    Recently took pcn for tooth problems, without issues  . Morphine And Related Other (See Comments)    Abnormal behavior    Medications Prior to Admission  Medication Sig Dispense Refill  . albuterol (PROVENTIL) (5 MG/ML) 0.5% nebulizer solution Take 0.5 mLs (2.5 mg total) by nebulization every 3 (three) hours as needed for wheezing or shortness of breath.  20 mL  12  . aspirin EC 81 MG tablet Take 81 mg by mouth daily.      . budesonide (PULMICORT) 0.25 MG/2ML nebulizer solution Take 2 mLs (0.25 mg total) by nebulization every 6 (six) hours.  60 mL  12  . calcitRIOL (ROCALTROL) 0.5 MCG capsule Take 0.5 mcg by mouth daily.      . calcium carbonate (OS-CAL - DOSED IN MG OF ELEMENTAL CALCIUM) 1250 MG tablet Take 1 tablet (500 mg of elemental calcium total) by mouth 4 (four) times daily.  120 tablet  5  . clopidogrel (PLAVIX) 75 MG tablet Take 75 mg by mouth daily.      . furosemide (LASIX) 40 MG tablet Take 1 tablet (40 mg total) by mouth 2 (two) times daily.  60 tablet  5  . gabapentin (NEURONTIN) 600 MG tablet Take 600 mg by mouth 2 (two) times daily.       . hydrALAZINE (APRESOLINE) 25 MG tablet Take 3 tablets (75 mg total) by mouth 2 (two) times daily.  180 tablet  5  . isosorbide mononitrate (IMDUR) 30 MG 24 hr tablet Take 1 tablet (30 mg total) by mouth daily.  30 tablet  6  . levothyroxine (SYNTHROID, LEVOTHROID) 125 MCG tablet Take 125 mcg by mouth daily before breakfast.      . Maltodextrin-Xanthan Gum (RESOURCE THICKENUP CLEAR) POWD As directed on the packaging.      . metoprolol succinate (TOPROL-XL) 25 MG 24 hr tablet Take 25 mg by mouth daily.      . nitroGLYCERIN (NITROSTAT) 0.4 MG SL tablet Place 1 tablet (0.4 mg total) under the tongue every 5 (five) minutes x 3 doses as needed for chest pain.  25 tablet  4  . oxyCODONE-acetaminophen (PERCOCET/ROXICET) 5-325 MG per tablet Take 1-2 tablets by mouth every 4 (four)  hours as needed for pain.  40 tablet  0  . pantoprazole (PROTONIX) 40 MG tablet Take 1 tablet (40 mg total) by mouth daily at 12 noon.  30 tablet  2  . potassium chloride (K-DUR,KLOR-CON) 10 MEQ tablet Take 10 mEq by mouth daily.      Marland Kitchen tiotropium (SPIRIVA) 18 MCG inhalation capsule Place 1 capsule (18 mcg total) into inhaler and inhale daily.  30 capsule  0  Results for orders placed during the hospital encounter of 12/29/12 (from the past 48 hour(s))  CBC WITH DIFFERENTIAL     Status: Abnormal   Collection Time    12/29/12  2:21 PM      Result Value Range   WBC 10.3  4.0 - 10.5 K/uL   RBC 3.62 (*) 4.22 - 5.81 MIL/uL   Hemoglobin 9.3 (*) 13.0 - 17.0 g/dL   HCT 16.1 (*) 09.6 - 04.5 %   MCV 82.3  78.0 - 100.0 fL   MCH 25.7 (*) 26.0 - 34.0 pg   MCHC 31.2  30.0 - 36.0 g/dL   RDW 40.9 (*) 81.1 - 91.4 %   Platelets 358  150 - 400 K/uL   Neutrophils Relative % 72  43 - 77 %   Neutro Abs 7.4  1.7 - 7.7 K/uL   Lymphocytes Relative 13  12 - 46 %   Lymphs Abs 1.4  0.7 - 4.0 K/uL   Monocytes Relative 14 (*) 3 - 12 %   Monocytes Absolute 1.4 (*) 0.1 - 1.0 K/uL   Eosinophils Relative 1  0 - 5 %   Eosinophils Absolute 0.1  0.0 - 0.7 K/uL   Basophils Relative 0  0 - 1 %   Basophils Absolute 0.0  0.0 - 0.1 K/uL  BASIC METABOLIC PANEL     Status: Abnormal   Collection Time    12/29/12  2:21 PM      Result Value Range   Sodium 137  135 - 145 mEq/L   Potassium 3.3 (*) 3.5 - 5.1 mEq/L   Chloride 90 (*) 96 - 112 mEq/L   CO2 36 (*) 19 - 32 mEq/L   Glucose, Bld 87  70 - 99 mg/dL   BUN 25 (*) 6 - 23 mg/dL   Creatinine, Ser 7.82 (*) 0.50 - 1.35 mg/dL   Calcium 95.6 (*) 8.4 - 10.5 mg/dL   GFR calc non Af Amer 46 (*) >90 mL/min   GFR calc Af Amer 54 (*) >90 mL/min   Comment:            The eGFR has been calculated     using the CKD EPI equation.     This calculation has not been     validated in all clinical     situations.     eGFR's persistently     <90 mL/min signify     possible  Chronic Kidney Disease.  PRO B NATRIURETIC PEPTIDE     Status: Abnormal   Collection Time    12/29/12  2:26 PM      Result Value Range   Pro B Natriuretic peptide (BNP) 46080.0 (*) 0 - 125 pg/mL  TROPONIN I     Status: None   Collection Time    12/29/12  2:26 PM      Result Value Range   Troponin I <0.30  <0.30 ng/mL   Comment:            Due to the release kinetics of cTnI,     a negative result within the first hours     of the onset of symptoms does not rule out     myocardial infarction with certainty.     If myocardial infarction is still suspected,     repeat the test at appropriate intervals.  URINALYSIS, ROUTINE W REFLEX MICROSCOPIC     Status: None   Collection Time    12/29/12  3:25 PM  Result Value Range   Color, Urine YELLOW  YELLOW   APPearance CLEAR  CLEAR   Specific Gravity, Urine 1.008  1.005 - 1.030   pH 7.5  5.0 - 8.0   Glucose, UA NEGATIVE  NEGATIVE mg/dL   Hgb urine dipstick NEGATIVE  NEGATIVE   Bilirubin Urine NEGATIVE  NEGATIVE   Ketones, ur NEGATIVE  NEGATIVE mg/dL   Protein, ur NEGATIVE  NEGATIVE mg/dL   Urobilinogen, UA 0.2  0.0 - 1.0 mg/dL   Nitrite NEGATIVE  NEGATIVE   Leukocytes, UA NEGATIVE  NEGATIVE   Comment: MICROSCOPIC NOT DONE ON URINES WITH NEGATIVE PROTEIN, BLOOD, LEUKOCYTES, NITRITE, OR GLUCOSE <1000 mg/dL.  CULTURE, BLOOD (ROUTINE X 2)     Status: None   Collection Time    12/29/12  3:57 PM      Result Value Range   Specimen Description BLOOD HAND RIGHT     Special Requests BOTTLES DRAWN AEROBIC ONLY 2.5CC     Culture  Setup Time 12/29/2012 23:13     Culture       Value:        BLOOD CULTURE RECEIVED NO GROWTH TO DATE CULTURE WILL BE HELD FOR 5 DAYS BEFORE ISSUING A FINAL NEGATIVE REPORT   Report Status PENDING    CULTURE, BLOOD (ROUTINE X 2)     Status: None   Collection Time    12/29/12  4:03 PM      Result Value Range   Specimen Description BLOOD ARM LEFT     Special Requests BOTTLES DRAWN AEROBIC ONLY 10CC     Culture   Setup Time 12/29/2012 23:13     Culture       Value:        BLOOD CULTURE RECEIVED NO GROWTH TO DATE CULTURE WILL BE HELD FOR 5 DAYS BEFORE ISSUING A FINAL NEGATIVE REPORT   Report Status PENDING    BLOOD GAS, ARTERIAL     Status: Abnormal   Collection Time    12/29/12  7:04 PM      Result Value Range   O2 Content 2.5     Delivery systems NASAL CANNULA     pH, Arterial 7.422  7.350 - 7.450   pCO2 arterial 62.4 (*) 35.0 - 45.0 mmHg   Comment: CRITICAL RESULT CALLED TO, READ BACK BY AND VERIFIED WITH:     NELLIE BUCK RN AT 1911 BY FRANCIS ALMONOR RRT,RCP ON 12/29/2012   pO2, Arterial 67.2 (*) 80.0 - 100.0 mmHg   Bicarbonate 39.9 (*) 20.0 - 24.0 mEq/L   TCO2 41.8  0 - 100 mmol/L   Acid-Base Excess 14.6 (*) 0.0 - 2.0 mmol/L   O2 Saturation 93.5     Patient temperature 98.6     Collection site RIGHT RADIAL     Drawn by COLLECTED BY RT     Sample type ARTERIAL DRAW     Allens test (pass/fail) PASS  PASS  TROPONIN I     Status: None   Collection Time    12/30/12 12:12 AM      Result Value Range   Troponin I <0.30  <0.30 ng/mL   Comment:            Due to the release kinetics of cTnI,     a negative result within the first hours     of the onset of symptoms does not rule out     myocardial infarction with certainty.     If myocardial infarction is still suspected,  repeat the test at appropriate intervals.  BASIC METABOLIC PANEL     Status: Abnormal   Collection Time    12/30/12  1:57 AM      Result Value Range   Sodium 139  135 - 145 mEq/L   Potassium 3.9  3.5 - 5.1 mEq/L   Chloride 92 (*) 96 - 112 mEq/L   CO2 39 (*) 19 - 32 mEq/L   Glucose, Bld 123 (*) 70 - 99 mg/dL   BUN 24 (*) 6 - 23 mg/dL   Creatinine, Ser 1.61 (*) 0.50 - 1.35 mg/dL   Calcium 09.6 (*) 8.4 - 10.5 mg/dL   GFR calc non Af Amer 47 (*) >90 mL/min   GFR calc Af Amer 54 (*) >90 mL/min   Comment:            The eGFR has been calculated     using the CKD EPI equation.     This calculation has not been      validated in all clinical     situations.     eGFR's persistently     <90 mL/min signify     possible Chronic Kidney Disease.  BLOOD GAS, ARTERIAL     Status: Abnormal   Collection Time    12/30/12 10:40 AM      Result Value Range   O2 Content 2.0     Delivery systems NASAL CANNULA     pH, Arterial 7.452 (*) 7.350 - 7.450   pCO2 arterial 54.7 (*) 35.0 - 45.0 mmHg   pO2, Arterial 63.1 (*) 80.0 - 100.0 mmHg   Bicarbonate 37.6 (*) 20.0 - 24.0 mEq/L   TCO2 39.3  0 - 100 mmol/L   Acid-Base Excess 12.9 (*) 0.0 - 2.0 mmol/L   O2 Saturation 91.4     Patient temperature 98.6     Collection site LEFT RADIAL     Drawn by [id number]     Sample type ARTERIAL DRAW     Allens test (pass/fail) PASS  PASS  BASIC METABOLIC PANEL     Status: Abnormal   Collection Time    12/31/12  4:25 AM      Result Value Range   Sodium 132 (*) 135 - 145 mEq/L   Comment: DELTA CHECK NOTED   Potassium 3.6  3.5 - 5.1 mEq/L   Chloride 88 (*) 96 - 112 mEq/L   CO2 38 (*) 19 - 32 mEq/L   Glucose, Bld 140 (*) 70 - 99 mg/dL   BUN 33 (*) 6 - 23 mg/dL   Creatinine, Ser 8.11 (*) 0.50 - 1.35 mg/dL   Calcium 91.4  8.4 - 78.2 mg/dL   GFR calc non Af Amer 49 (*) >90 mL/min   GFR calc Af Amer 57 (*) >90 mL/min   Comment:            The eGFR has been calculated     using the CKD EPI equation.     This calculation has not been     validated in all clinical     situations.     eGFR's persistently     <90 mL/min signify     possible Chronic Kidney Disease.   Dg Chest 2 View  12/31/2012   *RADIOLOGY REPORT*  Clinical Data: Shortness of breath and findings suggestive of left lower lobe infiltrate by prior chest x-ray.  CHEST - 2 VIEW  Comparison: 12/29/2012  Findings: There is suggestion of left lower lobe infiltrate with associated small left  pleural effusion.  Underlying significant COPD/chronic lung disease again noted.  The heart size is within normal limits.  Stable osteopenia of the thoracic spine with several compression  fractures identified.  IMPRESSION: Persistent suggestion of left lower lobe infiltrate with associated small left pleural effusion.   Original Report Authenticated By: [name] [name], M.D.   Dg Chest 2 View  12/29/2012   *RADIOLOGY REPORT*  Clinical Data: Cough, congestion, wheezing.  Thyroid cancer.  CHEST - 2 VIEW  Comparison: 12/07/2012.  Findings: Chronic cardiopericardial enlargement.  The mediastinal contours are distorted by rightward rotation.  There is extensive aortic calcification.  Abnormal left lower lobe aeration, with elevation of the diaphragm. Morphology of the lower lung may represent subpulmonic pleural fluid.  Chronic coarsening of interstitial markings, likely chronic lung disease rather than edema.  Small pulmonary nodules noted on previous PET-CT not clearly seen today. Right third, fourth, fifth, seventh, and eighth rib fractures, present on previous PET imaging.  IMPRESSION: 1.  Abnormal aeration at the left base, suspicious for pneumonia with small pleural effusion. 2. Numerous nonacute, but potentially unhealed upper right rib fractures.   Original Report Authenticated By: [name] [name]   Nm Pulmonary Perf And Vent  12/30/2012   *RADIOLOGY REPORT*  Clinical Data:  Shortness of breath with wheezing.  Papillary thyroid cancer.  Question pulmonary embolism.  NUCLEAR MEDICINE VENTILATION - PERFUSION LUNG SCAN  Technique:  Ventilation images were obtained in multiple projections using inhaled aerosol technetium 99 M DTPA.  Perfusion images were obtained in multiple projections after intravenous injection of Tc-69m MAA.  Radiopharmaceuticals:  Tc-45m DTPA aerosol and 6.0 mCi Tc-58m MAA.  Comparison: Chest radiographs 12/29/2012.  PET CT 12/15/2012.  Findings:  Ventilation:  There is prominent central clumping of the aerosol within the trachea and proximal bronchi in asymmetric to the left. There are patchy ventilatory defects in both lungs suggesting underlying obstructive lung  disease.  Free pertechnetate is noted within the stomach.  Perfusion:  The perfusion appears matched to the ventilatory examination.  There are no perfusion defects to suggest pulmonary embolism.  IMPRESSION:  1.  Low probability for acute pulmonary embolism. 2.  Central aerosol clumping and patchy ventilation suggesting obstructive lung disease.   Original Report Authenticated By: [name] [name], M.D.   BNP (last 3 results)  Recent Labs  12/02/12 1939 12/05/12 0520 12/29/12 1426  PROBNP 64029.0* 30416.0* 46080.0*     ROS: General:no colds or fevers, no weight changes Skin:no rashes or ulcers HEENT:no blurred vision, no congestion CV:see HPI PUL:see HPI GI:no diarrhea constipation or melena, no indigestion GU:no hematuria, no dysuria MS:no joint pain, no claudication Neuro:no syncope, no lightheadedness Endo:no diabetes, no thyroid disease   Blood pressure 158/66, pulse 89, temperature 97.3 F (36.3 C), temperature source Oral, resp. rate 16, height 5\' 6"  (1.676 m), weight 124 lb 9.6 oz (56.518 kg), SpO2 100.00%. ZO:XWRUEAV:WUJWJXBJ affect, NAD Skin:Warm and dry, brisk capillary refill HEENT:normocephalic, sclera clear, mucus membranes moist Neck:supple, no JVD, no bruits  Heart:S1S2 RRR without murmur, gallup, rub or click Lungs:clear without rales, rhonchi, or wheezes, harsh cough, productive YNW:GNFA, non tender, + BS, do not palpate liver spleen or masses Ext:no lower ext edema, 2+ pedal pulses, 2+ radial pulses Neuro:alert and oriented, MAE, follows commands, + facial symmetry    Assessment/Plan Principal Problem:   Acute and chronic respiratory failure Active Problems:   COPD (chronic obstructive pulmonary disease)   Ischemic cardiomyopathy, EF 30-35% by echo 03/29/12 - improved to 50-55% by 05/2012, but back to  35% 09/2012   Pericardial effusion, moderate on echo 03/29/12, continues to be moderate this admit   Malignant neoplasm of thyroid gland- surgey April 2014    CAD, unsuccessful RCA PCI Sept 2013   Chronic renal disease, stage III   SOB (shortness of breath)  PLAN: EF dropped again after thyroid surgery with resp failure.  Last admit Milrinone was used.  Will increase hydralazine and IMDUR.  Keep today.  PCO2 was 62 on admit with PO2 of 67. By yesterday improved to PCO2 54.7 and PO2 of 63.   Did have short burst of PAT vs. PAF 4 sec. On tele, does have freq PACs.  Agree with 40 IV lasix BID today.    [facility] R  Nurse Practitioner Certified [facility] Pager [phone] 12/31/2012, 10:29 AM    Patient seen and examined. Agree with assessment and plan. &72 yo WM with a h/o COPD, CAD, CHF felt to be due to both systolic and diastolic etiology who was re-admitted after a recent hospitalization ~ 3 weeks ago for CHF exacerbation. When patient was dc at last hospitalization BNP was still significantly elevated although much improved from admission.  Presently he feels improved but would not consider DC prematurely. Would continue with IV lasix, and plan further titration of hydralazine to 75 mg tid and slightly increase nitrates. Will add lanoxin at 0.0625 mg daily.  Recommend BNP, electrolytes follow-up in am.   [name] Bihari, MD, [facility] 12/31/2012 11:18 AM

## 2013-01-01 ENCOUNTER — Inpatient Hospital Stay (HOSPITAL_COMMUNITY): Payer: Medicare Other

## 2013-01-01 DIAGNOSIS — C73 Malignant neoplasm of thyroid gland: Secondary | ICD-10-CM

## 2013-01-01 DIAGNOSIS — I519 Heart disease, unspecified: Secondary | ICD-10-CM

## 2013-01-01 DIAGNOSIS — I639 Cerebral infarction, unspecified: Secondary | ICD-10-CM | POA: Diagnosis present

## 2013-01-01 DIAGNOSIS — I10 Essential (primary) hypertension: Secondary | ICD-10-CM

## 2013-01-01 DIAGNOSIS — R4182 Altered mental status, unspecified: Secondary | ICD-10-CM

## 2013-01-01 DIAGNOSIS — R0989 Other specified symptoms and signs involving the circulatory and respiratory systems: Secondary | ICD-10-CM

## 2013-01-01 DIAGNOSIS — I6350 Cerebral infarction due to unspecified occlusion or stenosis of unspecified cerebral artery: Secondary | ICD-10-CM

## 2013-01-01 DIAGNOSIS — I471 Supraventricular tachycardia: Secondary | ICD-10-CM | POA: Diagnosis present

## 2013-01-01 DIAGNOSIS — E43 Unspecified severe protein-calorie malnutrition: Secondary | ICD-10-CM | POA: Diagnosis present

## 2013-01-01 DIAGNOSIS — J441 Chronic obstructive pulmonary disease with (acute) exacerbation: Secondary | ICD-10-CM

## 2013-01-01 LAB — BASIC METABOLIC PANEL
BUN: 36 mg/dL — ABNORMAL HIGH (ref 6–23)
Chloride: 91 mEq/L — ABNORMAL LOW (ref 96–112)
GFR calc Af Amer: 57 mL/min — ABNORMAL LOW (ref >90)
GFR calc non Af Amer: 49 mL/min — ABNORMAL LOW (ref >90)
Glucose, Bld: 100 mg/dL — ABNORMAL HIGH (ref 70–99)
Potassium: 3.6 mEq/L (ref 3.5–5.1)
Sodium: 135 mEq/L (ref 135–145)

## 2013-01-01 LAB — AMMONIA: Ammonia: 29 umol/L (ref 11–60)

## 2013-01-01 LAB — CBC
HCT: 27.1 % — ABNORMAL LOW (ref 39.0–52.0)
Hemoglobin: 8.7 g/dL — ABNORMAL LOW (ref 13.0–17.0)
MCHC: 32.1 g/dL (ref 30.0–36.0)
RBC: 3.38 MIL/uL — ABNORMAL LOW (ref 4.22–5.81)

## 2013-01-01 MED ORDER — GADOBENATE DIMEGLUMINE 529 MG/ML IV SOLN
10.0000 mL | Freq: Once | INTRAVENOUS | Status: AC | PRN
  Administered 2013-01-01: 10 mL via INTRAVENOUS

## 2013-01-01 MED ORDER — ISOSORBIDE MONONITRATE 15 MG HALF TABLET
15.0000 mg | ORAL_TABLET | Freq: Once | ORAL | Status: AC
  Administered 2013-01-01: 15 mg via ORAL
  Filled 2013-01-01: qty 1

## 2013-01-01 MED ORDER — ISOSORBIDE MONONITRATE ER 60 MG PO TB24
60.0000 mg | ORAL_TABLET | Freq: Every day | ORAL | Status: DC
Start: 2013-01-02 — End: 2013-01-07
  Administered 2013-01-02 – 2013-01-07 (×6): 60 mg via ORAL
  Filled 2013-01-01 (×6): qty 1

## 2013-01-01 NOTE — Progress Notes (Signed)
Assessment completed, pt very weak and fatigue at this time, continues with 2L of O2 [name] [name], no distress noticed. Pt c/o lower back pain 5/10, pain medication to be given. We'll continue with POC.

## 2013-01-01 NOTE — Progress Notes (Signed)
Subjective: No complaints, family concerned about his lethargy/somulence Prob related to elevated CO2  Objective: Vital signs in last 24 hours: Temp:  [97.9 F (36.6 C)-98 F (36.7 C)] 98 F (36.7 C) (07/13 0533) Pulse Rate:  [70-96] 96 (07/13 0533) Resp:  [17-18] 18 (07/13 0533) BP: (133-151)/(62-74) 151/62 mmHg (07/13 0533) SpO2:  [93 %-98 %] 97 % (07/13 0833) Weight:  [123 lb 14.4 oz (56.2 kg)] 123 lb 14.4 oz (56.2 kg) (07/13 0533) Weight change: -11.2 oz (-0.318 kg) Last BM Date: 12/30/12 Intake/Output from previous day: +225 (-1113 this admit) though wt is down slightly from yesterday. 07/12 0701 - 07/13 0700 In: 800 [P.O.:800] Out: 575 [Urine:575] Intake/Output this shift:    PE: General:Pleasant affect, NAD Skin:Warm and dry, brisk capillary refill Heart:S1S2 RRR without murmur, gallup, rub or click Lungs: with some crackles, no rhonchi, or wheezes MWU:XLKG, non tender, + BS, do not palpate liver spleen or masses Ext:no lower ext edema, 2+ pedal pulses, 2+ radial pulses Neuro:alert and oriented, MAE, follows commands, + facial symmetry  tele: SR no PAT,  occ PVC     Lab Results:  BNP (last 3 results)  Recent Labs  12/05/12 0520 12/29/12 1426 01/01/13 0350  PROBNP 30416.0* 46080.0* 33652.0*      Recent Labs  12/29/12 1421 01/01/13 0350  WBC 10.3 12.5*  HGB 9.3* 8.7*  HCT 29.8* 27.1*  PLT 358 332   BMET  Recent Labs  12/31/12 0425 01/01/13 0350  NA 132* 135  K 3.6 3.6  CL 88* 91*  CO2 38* 38*  GLUCOSE 140* 100*  BUN 33* 36*  CREATININE 1.40* 1.39*  CALCIUM 10.1 10.4    Recent Labs  12/29/12 1426 12/30/12 0012  TROPONINI <0.30 <0.30    Lab Results  Component Value Date   CHOL 103 03/30/2012   HDL 48 03/17/2012   LDLCALC 58 03/17/2012   TRIG 85 03/17/2012   CHOLHDL 2.6 03/17/2012   Lab Results  Component Value Date   HGBA1C 5.4 03/17/2012     Lab Results  Component Value Date   TSH 62.027* 12/02/2012        Studies/Results: Dg Chest 2 View  12/31/2012   *RADIOLOGY REPORT*  Clinical Data: Shortness of breath and findings suggestive of left lower lobe infiltrate by prior chest x-ray.  CHEST - 2 VIEW  Comparison: 12/29/2012  Findings: There is suggestion of left lower lobe infiltrate with associated small left pleural effusion.  Underlying significant COPD/chronic lung disease again noted.  The heart size is within normal limits.  Stable osteopenia of the thoracic spine with several compression fractures identified.  IMPRESSION: Persistent suggestion of left lower lobe infiltrate with associated small left pleural effusion.   Original Report Authenticated By: Irish Lack, M.D.   Nm Pulmonary Perf And Vent  12/30/2012   *RADIOLOGY REPORT*  Clinical Data:  Shortness of breath with wheezing.  Papillary thyroid cancer.  Question pulmonary embolism.  NUCLEAR MEDICINE VENTILATION - PERFUSION LUNG SCAN  Technique:  Ventilation images were obtained in multiple projections using inhaled aerosol technetium 99 M DTPA.  Perfusion images were obtained in multiple projections after intravenous injection of Tc-69m MAA.  Radiopharmaceuticals:  Tc-39m DTPA aerosol and 6.0 mCi Tc-92m MAA.  Comparison: Chest radiographs 12/29/2012.  PET CT 12/15/2012.  Findings:  Ventilation:  There is prominent central clumping of the aerosol within the trachea and proximal bronchi in asymmetric to the left. There are patchy ventilatory defects in both lungs suggesting underlying  obstructive lung disease.  Free pertechnetate is noted within the stomach.  Perfusion:  The perfusion appears matched to the ventilatory examination.  There are no perfusion defects to suggest pulmonary embolism.  IMPRESSION:  1.  Low probability for acute pulmonary embolism. 2.  Central aerosol clumping and patchy ventilation suggesting obstructive lung disease.   Original Report Authenticated By: Carey Bullocks, M.D.    Medications: I have reviewed the  patient's current medications. Scheduled Meds: . albuterol  2.5 mg Nebulization QID  . aspirin EC  81 mg Oral Daily  . budesonide  0.25 mg Nebulization QID  . calcitRIOL  0.5 mcg Oral Daily  . calcium carbonate  1 tablet Oral TID WC & HS  . clopidogrel  75 mg Oral Daily  . digoxin  0.0625 mg Oral Daily  . feeding supplement  1 Container Oral TID BM  . furosemide  40 mg Intravenous BID  . heparin  5,000 Units Subcutaneous Q8H  . hydrALAZINE  75 mg Oral TID  . isosorbide mononitrate  45 mg Oral Daily  . levothyroxine  125 mcg Oral QAC breakfast  . metoprolol succinate  25 mg Oral Daily  . pantoprazole  40 mg Oral QAC breakfast  . potassium chloride  10 mEq Oral Daily  . potassium chloride  40 mEq Oral Once  . predniSONE  60 mg Oral Q breakfast  . sodium chloride  3 mL Intravenous Q12H  . 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  PLAN: wt down slightly on lasix 40 mg BID, IV;  BP still elevated in AM then controlled during the day. Pro BNP mildly decreased.  Pt wants to go home.     LOS: 3 days   Time spent with pt. :15 minutes. [facility] R  Nurse Practitioner Certified Pager [phone] 01/01/2013, 9:23 AM   Patient seen and examined. Agree with  assessment and plan. Breathing better. BNP improved but still at level of last dc when he subsequently returned to hospital with recurrence. Keep in hospital today; continue IV lasix today, increase nitrates. Possible dc in 24 - 48 hrs.   Lennette Bihari, MD, [facility] 01/01/2013 11:50 AM

## 2013-01-01 NOTE — Progress Notes (Addendum)
[facility] PROGRESS NOTE  [name] [name] WUJ:[id number] DOB: [birth year] DOA: 12/29/2012 PCP: [name], MD  Assessment/Plan: Acute on chronic respiratory failure  -I believe this to be multifactorial the majority of which is due to decompensated CHF - Pt JVD on admission and ProBNP 46080  -BNP trending down, cards continuing IV lasix today -Repeat ABG am 7/11 improved and pt was alert -V/Q scan neg for PE -No abx continued as pt just completed a full course of levaquin on 7/9, and he is afebrile with no leukocytosis>> repeat CXR uchanged from7/11- likely residual from recent PNA- Pt with no significant cough, he is afebrile with no leukocytosis 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 Talmage Nap  -scheduled for radioactive iodine on 01/11/13  -PET scan on 12/15/12 showed hypermetabolic supraclavicular, retropectoral, and superior mediastinal lymph nodes with increased uptake in the right lower lobe nodule  -continue low iodine diet  CKD stage III  -stable  -Baseline creatinine 1.3-1.6  Hypokalemia -resolved   Pt with intermittent AMS- last ABG with ph7.45/ co2 54.7/po2 63.1 - mild CO2 elevation unlikely to be the cause, ammonia level wnl, and Na wnl - MRI obtained today with tiny R.occipital infarct- pt is already on ASA  An paxil, I have consulted [facility] for further recs- Dr Roseanne Reno to see for further recs   Code Status: DNR Family Communication: Daughter  at bedside Disposition Plan: to home when medically  stable   Consultants:  cards  Procedures:  none  Antibiotics:  none  HPI/Subjective: Pt  Sleepy but easily aroused, per daughter some confusion also noted intermittently. denies CP  Objective: Filed Vitals:   12/31/12 2105 12/31/12 2131 01/01/13 0533 01/01/13 0833  BP:  147/64 151/62   Pulse:  93 96   Temp:  97.9 F (36.6 C) 98 F (36.7 C)   TempSrc:  Oral Oral   Resp:  18 18   Height:      Weight:   56.2 kg (123 lb 14.4 oz)   SpO2: 98% 95% 97% 97%    Intake/Output Summary (Last 24 hours) at 01/01/13 0835 Last data filed at 01/01/13 0351  Gross per 24 hour  Intake    800 ml  Output    575 ml  Net    225 ml   Filed Weights   12/30/12 0517 12/31/12 0505 01/01/13 0533  Weight: 55.702 kg (122 lb 12.8 oz) 56.518 kg (124 lb 9.6 oz) 56.2 kg (123 lb 14.4 oz)   Exam:  General: sleepy but easily aroused In NAD Cardiovascular: RRR, nl S1 s2 Respiratory: moderate air mov't bil, no wheezes, resp unlabored Abdomen: soft +BS NT/ND, no masses palpable Extremities: No cyanosis and no edema    Data Reviewed: Basic Metabolic Panel:  Recent Labs Lab 12/29/12 1421 12/30/12 0157 12/31/12 0425 01/01/13 0350  NA 137 139 132* 135  K 3.3* 3.9 3.6 3.6  CL 90* 92* 88* 91*  CO2 36* 39* 38* 38*  GLUCOSE 87 123* 140* 100*  BUN 25* 24* 33* 36*  CREATININE 1.47* 1.45* 1.40* 1.39*  CALCIUM 10.9* 10.6* 10.1 10.4   Liver Function Tests: No results found for this basename:  AST, ALT, ALKPHOS, BILITOT, PROT, ALBUMIN,  in the last 168 hours No results found for this basename: LIPASE, AMYLASE,  in the last 168 hours  Recent Labs Lab 01/01/13 0355  AMMONIA 29   CBC:  Recent Labs Lab 12/29/12 1421 01/01/13 0350  WBC 10.3 12.5*  NEUTROABS 7.4  --   HGB 9.3* 8.7*  HCT 29.8* 27.1*  MCV 82.3 80.2  PLT 358 332   Cardiac Enzymes:  Recent Labs Lab 12/29/12 1426 12/30/12 0012  TROPONINI <0.30 <0.30   BNP (last 3 results)  Recent Labs  12/05/12 0520 12/29/12 1426  01/01/13 0350  PROBNP 30416.0* 46080.0* 33652.0*   CBG: No results found for this basename: GLUCAP,  in the last 168 hours  Recent Results (from the past 240 hour(s))  CULTURE, BLOOD (ROUTINE X 2)     Status: None   Collection Time    12/29/12  3:57 PM      Result Value Range Status   Specimen Description BLOOD HAND RIGHT   Final   Special Requests BOTTLES DRAWN AEROBIC ONLY 2.5CC   Final   Culture  Setup Time 12/29/2012 23:13   Final   Culture     Final   Value:        BLOOD CULTURE RECEIVED NO GROWTH TO DATE CULTURE WILL BE HELD FOR 5 DAYS BEFORE ISSUING A FINAL NEGATIVE REPORT   Report Status PENDING   Incomplete  CULTURE, BLOOD (ROUTINE X 2)     Status: None   Collection Time    12/29/12  4:03 PM      Result Value Range Status   Specimen Description BLOOD ARM LEFT   Final   Special Requests BOTTLES DRAWN AEROBIC ONLY 10CC   Final   Culture  Setup Time 12/29/2012 23:13   Final   Culture     Final   Value:        BLOOD CULTURE RECEIVED NO GROWTH TO DATE CULTURE WILL BE HELD FOR 5 DAYS BEFORE ISSUING A FINAL NEGATIVE REPORT   Report Status PENDING   Incomplete     Studies: Dg Chest 2 View  12/31/2012   *RADIOLOGY REPORT*  Clinical Data: Shortness of breath and findings suggestive of left lower lobe infiltrate by prior chest x-ray.  CHEST - 2 VIEW  Comparison: 12/29/2012  Findings: There is suggestion of left lower lobe infiltrate with associated small left pleural effusion.  Underlying significant COPD/chronic lung disease again noted.  The heart size is within normal limits.  Stable osteopenia of the thoracic spine with several compression fractures identified.  IMPRESSION: Persistent suggestion of left lower lobe infiltrate with associated small left pleural effusion.   Original Report Authenticated By: Irish Lack, M.D.   Nm Pulmonary Perf And Vent  12/30/2012   *RADIOLOGY REPORT*  Clinical Data:  Shortness of breath with wheezing.  Papillary thyroid cancer.  Question  pulmonary embolism.  NUCLEAR MEDICINE VENTILATION - PERFUSION LUNG SCAN  Technique:  Ventilation images were obtained in multiple projections using inhaled aerosol technetium 99 M DTPA.  Perfusion images were obtained in multiple projections after intravenous injection of Tc-37m MAA.  Radiopharmaceuticals:  Tc-80m DTPA aerosol and 6.0 mCi Tc-1m MAA.  Comparison: Chest radiographs 12/29/2012.  PET CT 12/15/2012.  Findings:  Ventilation:  There is prominent central clumping of the aerosol within the trachea and proximal bronchi in asymmetric to the left. There are patchy ventilatory defects in both lungs suggesting underlying obstructive lung disease.  Free pertechnetate is noted within the  stomach.  Perfusion:  The perfusion appears matched to the ventilatory examination.  There are no perfusion defects to suggest pulmonary embolism.  IMPRESSION:  1.  Low probability for acute pulmonary embolism. 2.  Central aerosol clumping and patchy ventilation suggesting obstructive lung disease.   Original Report Authenticated By: Carey Bullocks, M.D.    Scheduled Meds: . albuterol  2.5 mg Nebulization QID  . aspirin EC  81 mg Oral Daily  . budesonide  0.25 mg Nebulization QID  . calcitRIOL  0.5 mcg Oral Daily  . calcium carbonate  1 tablet Oral TID WC & HS  . clopidogrel  75 mg Oral Daily  . digoxin  0.0625 mg Oral Daily  . feeding supplement  1 Container Oral TID BM  . furosemide  40 mg Intravenous BID  . heparin  5,000 Units Subcutaneous Q8H  . hydrALAZINE  75 mg Oral TID  . isosorbide mononitrate  45 mg Oral Daily  . levothyroxine  125 mcg Oral QAC breakfast  . metoprolol succinate  25 mg Oral Daily  . pantoprazole  40 mg Oral QAC breakfast  . potassium chloride  10 mEq Oral Daily  . potassium chloride  40 mEq Oral Once  . predniSONE  60 mg Oral Q breakfast  . sodium chloride  3 mL Intravenous Q12H  . 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 If 7PM-7AM, please contact night-coverage at [email or web address], password [facility] 01/01/2013, 8:35 AM  LOS: 3 days

## 2013-01-01 NOTE — Consult Note (Addendum)
Referring Physician: Dr. Donna Bernard    Chief Complaint: Altered mental status with abnormal MRI consistent with acute stroke.  HPI: [name] [name] is an 72 y.o. male with a history of hypertension, hyperlipidemia, peripheral vascular disease, congestive heart failure, coronary artery disease and previous stroke, being evaluated by neurology because of abnormal MRI study with small right inferior occipital acute stroke. 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 Patient has had altered mental status but no focal deficits have been noted. Onset is unclear. Patient has been on aspirin and Plavix daily. NIH stroke score was 2.  LSN: Unclear tPA Given: No: Minimal deficits and on 9 time of onset mRankin:  Past Medical History  Diagnosis Date  . Hypertension   . Hyperlipidemia   . Peripheral vascular disease     Hx of Ax-fem BPG, known LCCA disease  . CHF (congestive heart failure)     Ischemic cardiomyopathy EF 30 - 35%  . Cancer     skin cancer, Thyroid cancer  . Stroke 2004    minor  . Dysrhythmia     PAF  . COPD (chronic obstructive pulmonary disease)     PT DENIES USES O2 2 LITERS HS  . Heart murmur     Mild-moderate MR  . Hepatitis     AT AGE  89; unknown type    . Heart attack     03/17/12; subtotally occluded RCI with unsucessful PCI  . Coronary artery disease     Cardiologist is Dr. Nanetta Batty   . Thyroid cancer     Papillary  . Renal insufficiency     Family History  Problem Relation Age of Onset  . Alzheimer's disease Mother   . Heart failure Mother   . Heart disease Mother   . Cancer Father     prostate  . Heart disease Father   . Heart attack Father   . Cancer Brother     prostate  . Stroke Brother      Medications:  I have reviewed the patient's current medications. Scheduled: . albuterol  2.5 mg Nebulization QID  . aspirin EC  81 mg Oral Daily  . budesonide  0.25 mg Nebulization QID  .  calcitRIOL  0.5 mcg Oral Daily  . calcium carbonate  1 tablet Oral TID WC & HS  . clopidogrel  75 mg Oral Daily  . digoxin  0.0625 mg Oral Daily  . feeding supplement  1 Container Oral TID BM  . furosemide  40 mg Intravenous BID  . heparin  5,000 Units Subcutaneous Q8H  . hydrALAZINE  75 mg Oral TID  . [START ON 01/02/2013] isosorbide mononitrate  60 mg Oral Daily  . levothyroxine  125 mcg Oral QAC breakfast  . metoprolol succinate  25 mg Oral Daily  . pantoprazole  40 mg Oral QAC breakfast  . potassium chloride  10 mEq Oral Daily  . potassium chloride  40 mEq Oral Once  . predniSONE  60 mg Oral Q breakfast  . sodium chloride  3 mL Intravenous Q12H  . tiotropium  18 mcg Inhalation Daily   Continuous:  XBJ:YNWGNF chloride, acetaminophen, albuterol, nitroGLYCERIN, ondansetron (ZOFRAN) IV, oxyCODONE-acetaminophen, RESOURCE THICKENUP CLEAR, sodium chloride  ROS:  Unobtainable as patient's responses unreliable to mental status changes.   Physical Examination: Blood pressure 145/60, pulse 89, temperature 97.2 F (36.2 C), temperature source Oral, resp. rate 18, height 5\' 6"  (1.676 m), weight 56.2 kg (123 lb 14.4 oz),  SpO2 99.00%.  Neurologic Examination: Mental Status: Alert, disoriented to correct month.  Dysarthric raspy speech without evidence of aphasia. Able to follow commands without difficulty. Cranial Nerves: II-Visual fields were normal. III/IV/VI-Pupils were equal and reacted. Extraocular movements were full and conjugate.    V/VII-no facial numbness and no facial weakness. Lasandra Beech dysarthric speech; symmetrical palatal movement. Motor: 5/5 bilaterally with normal tone and bulk Sensory: Normal throughout. Deep Tendon Reflexes: 2+ and symmetric. Plantars: Flexor on the right and mute on the left Cerebellar: Normal finger-to-nose testing. Carotid auscultation: Right carotid bruit   Dg Chest 2 View  12/31/2012   *RADIOLOGY REPORT*  Clinical Data: Shortness  of breath and findings suggestive of left lower lobe infiltrate by prior chest x-ray.  CHEST - 2 VIEW  Comparison: 12/29/2012  Findings: There is suggestion of left lower lobe infiltrate with associated small left pleural effusion.  Underlying significant COPD/chronic lung disease again noted.  The heart size is within normal limits.  Stable osteopenia of the thoracic spine with several compression fractures identified.  IMPRESSION: Persistent suggestion of left lower lobe infiltrate with associated small left pleural effusion.   Original Report Authenticated By: Irish Lack, M.D.   Mr Laqueta Jean Wo Contrast  01/01/2013   *RADIOLOGY REPORT*  Clinical Data: Altered mental status.  History of high blood pressure and hyperlipidemia.  Thyroid cancer.  MRI HEAD WITHOUT AND WITH CONTRAST  Technique:  Multiplanar, multiecho pulse sequences of the brain and surrounding structures were obtained according to standard protocol without and with intravenous contrast  Contrast: 10mL MULTIHANCE GADOBENATE DIMEGLUMINE 529 MG/ML IV SOLN  Comparison: 11/26/2003 head CT.  No comparison brain MR.  Findings: Motion degraded exam.   Tiny acute right occipital lobe infarct.  Remote infarct with blood stained encephalomalacia left caudate head and subsequent mild dilation of the adjacent left lateral ventricle.  Remote small thalamic infarcts larger on the left.  Moderate small vessel disease type changes.  Atrophy with ventricular prominence felt to be related to atrophy rather hydrocephalus.  Left internal carotid artery appears to be occluded or significantly narrowed with reconstitution of flow supraclinoid region.  Transverse ligament hypertrophy.  Cervical medullary junction unremarkable.  Partial empty sella incidentally noted.  No intracranial mass or abnormal enhancement noted on the significantly motion degraded images.  IMPRESSION: Motion degraded exam.  Tiny acute right occipital lobe infarct.  Remote infarct with blood  stained encephalomalacia left caudate head and subsequent mild dilation of the adjacent left lateral ventricle.  Remote small thalamic infarcts larger on the left.  Moderate small vessel disease type changes.  Left internal carotid artery appears to be occluded or significantly narrowed with reconstitution of flow supraclinoid region.  Critical Value/emergent results were called by telephone at the time of interpretation on 01/01/2013 at 2:30 p.m. to [facility] patient's nurse, who verbally acknowledged these results.   Original Report Authenticated By: Lacy Duverney, M.D.    Assessment: 72 y.o. male with multiple risk factors for stroke with altered mental status and an MRI consistent with small right inferior occipital ischemic infarction, as well as findings indicative of high-grade stenosis and possibly occlusion of left internal carotid artery.  Stroke Risk Factors - carotid stenosis, hyperlipidemia and hypertension  Plan: 1. HgbA1c, fasting lipid panel 2. MRA  of the brain without contrast 3. PT consult, OT consult, Speech consult 4. Echocardiogram 5. Carotid dopplers 6. Prophylactic therapy-Antiplatelet med: Aspirin 81 mg per day and Plavix 75 mg per day 7. Risk factor modification 8. Telemetry monitoring  Arnell Sieving, MD [facility] [phone]  01/01/2013, 10:34 PM

## 2013-01-02 ENCOUNTER — Inpatient Hospital Stay (HOSPITAL_COMMUNITY): Payer: Medicare Other

## 2013-01-02 DIAGNOSIS — E039 Hypothyroidism, unspecified: Secondary | ICD-10-CM

## 2013-01-02 DIAGNOSIS — I70219 Atherosclerosis of native arteries of extremities with intermittent claudication, unspecified extremity: Secondary | ICD-10-CM

## 2013-01-02 DIAGNOSIS — J810 Acute pulmonary edema: Secondary | ICD-10-CM

## 2013-01-02 DIAGNOSIS — I359 Nonrheumatic aortic valve disorder, unspecified: Secondary | ICD-10-CM

## 2013-01-02 DIAGNOSIS — I2589 Other forms of chronic ischemic heart disease: Secondary | ICD-10-CM

## 2013-01-02 DIAGNOSIS — I4891 Unspecified atrial fibrillation: Secondary | ICD-10-CM

## 2013-01-02 LAB — BASIC METABOLIC PANEL
BUN: 37 mg/dL — ABNORMAL HIGH (ref 6–23)
CO2: 36 mEq/L — ABNORMAL HIGH (ref 19–32)
Chloride: 89 mEq/L — ABNORMAL LOW (ref 96–112)
GFR calc non Af Amer: 49 mL/min — ABNORMAL LOW (ref >90)
Glucose, Bld: 94 mg/dL (ref 70–99)
Potassium: 3.4 mEq/L — ABNORMAL LOW (ref 3.5–5.1)

## 2013-01-02 LAB — HEMOGLOBIN A1C
Hgb A1c MFr Bld: 5.6 % (ref <5.7)
Mean Plasma Glucose: 114 mg/dL (ref <117)

## 2013-01-02 LAB — LIPID PANEL
Cholesterol: 170 mg/dL (ref 0–200)
HDL: 45 mg/dL (ref >39)
Total CHOL/HDL Ratio: 3.8 RATIO
Triglycerides: 86 mg/dL (ref <150)

## 2013-01-02 MED ORDER — POTASSIUM CHLORIDE CRYS ER 20 MEQ PO TBCR
40.0000 meq | EXTENDED_RELEASE_TABLET | Freq: Once | ORAL | Status: AC
  Administered 2013-01-02: 40 meq via ORAL

## 2013-01-02 MED ORDER — PREDNISONE 20 MG PO TABS
40.0000 mg | ORAL_TABLET | Freq: Every day | ORAL | Status: DC
Start: 2013-01-03 — End: 2013-01-03
  Administered 2013-01-03: 40 mg via ORAL
  Filled 2013-01-02 (×2): qty 2

## 2013-01-02 MED ORDER — AMOXICILLIN-POT CLAVULANATE 875-125 MG PO TABS
1.0000 | ORAL_TABLET | Freq: Two times a day (BID) | ORAL | Status: DC
  Administered 2013-01-02 – 2013-01-03 (×3): 1 via ORAL
  Filled 2013-01-02 (×5): qty 1

## 2013-01-02 NOTE — Progress Notes (Addendum)
[facility]  Subjective: Somnolent. Daughter by bedside. A&O x 3 (person, place and time). He states his breathing has improved.   Objective: Vital signs in last 24 hours: Temp:  [97.2 F (36.2 C)-98 F (36.7 C)] 98 F (36.7 C) (07/14 0538) Pulse Rate:  [71-89] 71 (07/14 0538) Resp:  [18-20] 20 (07/14 0538) BP: (135-155)/(60-69) 155/69 mmHg (07/14 0538) SpO2:  [96 %-99 %] 96 % (07/14 0825) Weight:  [123 lb 12.8 oz (56.155 kg)] 123 lb 12.8 oz (56.155 kg) (07/14 0538) Last BM Date: 12/30/12  Intake/Output from previous day: 07/13 0701 - 07/14 0700 In: 538 [P.O.:538] Out: 400 [Urine:400] Intake/Output this shift: Total I/O In: 220 [P.O.:220] Out: -   Medications Current Facility-Administered Medications  Medication Dose Route Frequency Provider Last Rate Last Dose  . 0.9 %  sodium chloride infusion  250 mL Intravenous PRN Catarina Hartshorn, MD      . acetaminophen (TYLENOL) tablet 650 mg  650 mg Oral Q4H PRN Catarina Hartshorn, MD      . albuterol (PROVENTIL) (5 MG/ML) 0.5% nebulizer solution 2.5 mg  2.5 mg Nebulization QID Kela Millin, MD   2.5 mg at 01/02/13 1610  . albuterol (PROVENTIL) (5 MG/ML) 0.5% nebulizer solution 2.5 mg  2.5 mg Nebulization Q4H PRN Adeline C Viyuoh, MD      . aspirin EC tablet 81 mg  81 mg Oral Daily Catarina Hartshorn, MD   81 mg at 01/01/13 1050  . budesonide (PULMICORT) nebulizer solution 0.25 mg  0.25 mg Nebulization QID Kela Millin, MD   0.25 mg at 01/02/13 9604  . calcitRIOL (ROCALTROL) capsule 0.5 mcg  0.5 mcg Oral Daily Catarina Hartshorn, MD   0.5 mcg at 01/01/13 1052  . calcium carbonate (OS-CAL - dosed in mg of elemental calcium) tablet 500 mg of elemental calcium  1 tablet Oral TID WC & HS Catarina Hartshorn, MD   500 mg of elemental calcium at 01/02/13 0625  . clopidogrel (PLAVIX) tablet 75 mg  75 mg Oral Daily Catarina Hartshorn, MD   75 mg at 01/01/13 1052  . digoxin (LANOXIN) tablet 0.0625 mg  0.0625 mg Oral Daily Lennette Bihari, MD   0.0625 mg at  01/01/13 1051  . feeding supplement (ENSURE) pudding 1 Container  1 Container Oral TID BM Tonye Becket, RD   1 Container at 01/01/13 2000  . furosemide (LASIX) injection 40 mg  40 mg Intravenous BID Kela Millin, MD   40 mg at 01/01/13 1752  . heparin injection 5,000 Units  5,000 Units Subcutaneous Q8H Catarina Hartshorn, MD   5,000 Units at 01/02/13 5409  . hydrALAZINE (APRESOLINE) tablet 75 mg  75 mg Oral TID Nada Boozer, NP   75 mg at 01/01/13 2100  . isosorbide mononitrate (IMDUR) 24 hr tablet 60 mg  60 mg Oral Daily Lennette Bihari, MD      . levothyroxine (SYNTHROID, LEVOTHROID) tablet 125 mcg  125 mcg Oral QAC breakfast Catarina Hartshorn, MD   125 mcg at 01/02/13 [phone]  . metoprolol succinate (TOPROL-XL) 24 hr tablet 25 mg  25 mg Oral Daily Catarina Hartshorn, MD   25 mg at 01/01/13 1050  . nitroGLYCERIN (NITROSTAT) SL tablet 0.4 mg  0.4 mg Sublingual Q5 Min x 3 PRN Catarina Hartshorn, MD      . ondansetron [facility]) injection 4 mg  4 mg Intravenous Q6H PRN Catarina Hartshorn, MD      . oxyCODONE-acetaminophen (PERCOCET/ROXICET) 5-325 MG per tablet 1  tablet  1 tablet Oral Q4H PRN Catarina Hartshorn, MD   1 tablet at 01/01/13 2057  . pantoprazole (PROTONIX) EC tablet 40 mg  40 mg Oral QAC breakfast Kela Millin, MD   40 mg at 01/02/13 0625  . potassium chloride (K-DUR,KLOR-CON) CR tablet 10 mEq  10 mEq Oral Daily Catarina Hartshorn, MD   10 mEq at 01/01/13 1050  . potassium chloride SA (K-DUR,KLOR-CON) CR tablet 40 mEq  40 mEq Oral Once Enid Skeens, MD      . predniSONE (DELTASONE) tablet 60 mg  60 mg Oral Q breakfast Kela Millin, MD   60 mg at 01/02/13 0624  . RESOURCE THICKENUP CLEAR   Oral PRN Catarina Hartshorn, MD      . sodium chloride 0.9 % injection 3 mL  3 mL Intravenous Q12H Catarina Hartshorn, MD   3 mL at 01/01/13 2100  . sodium chloride 0.9 % injection 3 mL  3 mL Intravenous PRN Catarina Hartshorn, MD      . tiotropium [facility]) inhalation capsule 18 mcg  18 mcg Inhalation Daily Catarina Hartshorn, MD   18 mcg at 01/02/13 9604    PE: General appearance:  alert, cooperative and no distress Neck: + JVD, + mass left of midline Lungs: bilateral rhonchi, crackles cannot be excluded Heart: regular rate and rhythm and ? S4 Extremities: no LEE Pulses: 2+ and symmetric Skin: warm and dry Neurologic: Grossly normal  Lab Results:   Recent Labs  01/01/13 0350  WBC 12.5*  HGB 8.7*  HCT 27.1*  PLT 332   BMET  Recent Labs  12/31/12 0425 01/01/13 0350 01/02/13 0610  NA 132* 135 133*  K 3.6 3.6 3.4*  CL 88* 91* 89*  CO2 38* 38* 36*  GLUCOSE 140* 100* 94  BUN 33* 36* 37*  CREATININE 1.40* 1.39* 1.41*  CALCIUM 10.1 10.4 10.2   BNP (last 3 results)  Recent Labs  12/29/12 1426 01/01/13 0350 01/02/13 0610  PROBNP 46080.0* 33652.0* 34244.0*   Filed Weights   12/31/12 0505 01/01/13 0533 01/02/13 0538  Weight: 124 lb 9.6 oz (56.518 kg) 123 lb 14.4 oz (56.2 kg) 123 lb 12.8 oz (56.155 kg)   Assessment/Plan  Principal Problem:   Acute and chronic respiratory failure Active Problems:   COPD (chronic obstructive pulmonary disease)   Ischemic cardiomyopathy, EF 30-35% by echo 03/29/12 - improved to 50-55% by 05/2012, but back to 35% 09/2012   Pericardial effusion, moderate on echo 03/29/12, continues to be moderate this admit   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   Protein-calorie malnutrition, severe   CVA (cerebral infarction)  Plan: BNP is actually slightly up today than yesterday at 34,244 [phone] yesterday). Renal function is fairly stable, with only a slight increase in SCr since yesterday, at 1.41. Continue with IV Lasix for diuresis. Replete K+ for hypokalemia. Continue with hydralazine and nitrate for afterload reduction. 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 Continue on ASA + Plavix. Nephrology is following.     LOS: 4 days    Brittainy M. Sharol Harness, PA-C 01/02/2013 9:23 AM  I have seen and evaluated the patient this AM along with Brittainy M. Sharol Harness, PA-C. I agree  with her findings, examination as well as impression recommendations.  Very unusual turn of events -- last admission was notable for profound hypoNatremia - that has all but resolved.  Back in with dyspnea that may well be related to mild CHF exacerbation since he felt better with initial diuresis - His proBNP remains quite elevated -- maybe worth it to try to drive this down.  Not having good UOP on IV Lasix now.  Would like to increase Hydralazine/Nitrate combination for additional afterload reduction (used in lieu of ACE-I/ARB), but with recent CVA, will need to allow mild permissive HTN especially with carotid stenosis.   ? If he will benefit from a short trial of increased diuretic in an attempt to lower filling pressures (as indicated by proBNP) -- will defer to Nephrology given borderline renal function  ? If source of CVA is PAF - I am not sure, but I think warfarin was not being used due to bleeding concerns (will need to confer with Dr.Croitoru).  May want to consider moving from DAPT to warfarin vs. NOAC in future.    ? If Am somnolence is related to COPD related gas trapping -- will ask PCCM to see (is a pt of Dr. Delton Coombes) to consider possible nightime BiPAP.  MD Time with pt: 10 min  HARDING,DAVID W, M.D., M.S. [facility] [address] [place], [place]  [address]  [phone] Pager # [phone] 01/02/2013 12:06 PM

## 2013-01-02 NOTE — Consult Note (Signed)
PULMONARY  / CRITICAL CARE MEDICINE  Name: [name] [name] MRN: [id number] DOB: 05/17/1941    ADMISSION DATE:  12/29/2012 CONSULTATION DATE:  01/02/13  REFERRING MD :  Dr. Herbie Baltimore, Cardiology. PRIMARY SERVICE:  TRH  CHIEF COMPLAINT:  Morning somnolence.  BRIEF PATIENT DESCRIPTION: 72 year old male with history of COPD O2 dependent at 2L [place] at home who continues to smoke.  Has history of heart failure, CKD and PVD.  Noted to be more somnolent every AM by family and while in the hospital.  Cardiology asked we see patient for COPD and early morning somnolence.  Patient is arousable but lethargic and not providing much history.  SIGNIFICANT EVENTS / STUDIES:  7/14 pulmonary consult for COPD management and early morning somnolence.  LINES / TUBES: PIV  CULTURES: Blood 7/11>>>NTD  ANTIBIOTICS: Amoxacillin 7/11>>>  PAST MEDICAL HISTORY :  Past Medical History  Diagnosis Date  . Hypertension   . Hyperlipidemia   . Peripheral vascular disease     Hx of Ax-fem BPG, known LCCA disease  . CHF (congestive heart failure)     Ischemic cardiomyopathy EF 30 - 35%  . Cancer     skin cancer, Thyroid cancer  . Stroke 2004    minor  . Dysrhythmia     PAF  . COPD (chronic obstructive pulmonary disease)     PT DENIES USES O2 2 LITERS HS  . Heart murmur     Mild-moderate MR  . Hepatitis     AT AGE  34; unknown type    . Heart attack     03/17/12; subtotally occluded RCI with unsucessful PCI  . Coronary artery disease     Cardiologist is Dr. Nanetta Batty   . Thyroid cancer     Papillary  . Renal insufficiency    Past Surgical History  Procedure Laterality Date  . Bypass graft  07/2010    axillobifemoral BPG  . Spine surgery  11/23/06    microdiscectomy L5-S1  . Hemorrhoid surgery    . Tonsillectomy    . Hernia repair  02/09/11    Ventral  . Cardiac catheterization      2013  . Coronary angioplasty      2013  . Thyroidectomy Bilateral 10/07/2012    Procedure: THYROIDECTOMY;   Surgeon: Darletta Moll, MD;  Location: [facility] OR;  Service: ENT;  Laterality: Bilateral;  . Radical neck dissection Left 10/07/2012    Procedure: RADICAL NECK DISSECTION;  Surgeon: Darletta Moll, MD;  Location: [facility] OR;  Service: ENT;  Laterality: Left;   Prior to Admission medications   Medication Sig Start Date End Date Taking? Authorizing Provider  albuterol (PROVENTIL) (5 MG/ML) 0.5% nebulizer solution Take 0.5 mLs (2.5 mg total) by nebulization every 3 (three) hours as needed for wheezing or shortness of breath. 12/10/12  Yes Wilburt Finlay, PA-C  aspirin EC 81 MG tablet Take 81 mg by mouth daily.   Yes Historical Provider, MD  budesonide (PULMICORT) 0.25 MG/2ML nebulizer solution Take 2 mLs (0.25 mg total) by nebulization every 6 (six) hours. 12/10/12  Yes Wilburt Finlay, PA-C  calcitRIOL (ROCALTROL) 0.5 MCG capsule Take 0.5 mcg by mouth daily.   Yes Historical Provider, MD  calcium carbonate (OS-CAL - DOSED IN MG OF ELEMENTAL CALCIUM) 1250 MG tablet Take 1 tablet (500 mg of elemental calcium total) by mouth 4 (four) times daily. 10/10/12  Yes Darletta Moll, MD  clopidogrel (PLAVIX) 75 MG tablet Take 75 mg by mouth daily.  Yes Historical Provider, MD  furosemide (LASIX) 40 MG tablet Take 1 tablet (40 mg total) by mouth 2 (two) times daily. 12/10/12  Yes Wilburt Finlay, PA-C  gabapentin (NEURONTIN) 600 MG tablet Take 600 mg by mouth 2 (two) times daily.    Yes Historical Provider, MD  hydrALAZINE (APRESOLINE) 25 MG tablet Take 3 tablets (75 mg total) by mouth 2 (two) times daily. 12/16/12  Yes Wilburt Finlay, PA-C  isosorbide mononitrate (IMDUR) 30 MG 24 hr tablet Take 1 tablet (30 mg total) by mouth daily. 04/06/12  Yes Nada Boozer, NP  levothyroxine (SYNTHROID, LEVOTHROID) 125 MCG tablet Take 125 mcg by mouth daily before breakfast.   Yes Historical Provider, MD  Maltodextrin-Xanthan Gum (RESOURCE THICKENUP CLEAR) POWD As directed on the packaging. 12/10/12  Yes Wilburt Finlay, PA-C  metoprolol succinate (TOPROL-XL) 25 MG 24  hr tablet Take 25 mg by mouth daily.   Yes Historical Provider, MD  nitroGLYCERIN (NITROSTAT) 0.4 MG SL tablet Place 1 tablet (0.4 mg total) under the tongue every 5 (five) minutes x 3 doses as needed for chest pain. 04/06/12  Yes Nada Boozer, NP  oxyCODONE-acetaminophen (PERCOCET/ROXICET) 5-325 MG per tablet Take 1-2 tablets by mouth every 4 (four) hours as needed for pain. 10/10/12  Yes Darletta Moll, MD  pantoprazole (PROTONIX) 40 MG tablet Take 1 tablet (40 mg total) by mouth daily at 12 noon. 04/06/12  Yes Nada Boozer, NP  potassium chloride (K-DUR,KLOR-CON) 10 MEQ tablet Take 10 mEq by mouth daily.   Yes Historical Provider, MD  tiotropium (SPIRIVA) 18 MCG inhalation capsule Place 1 capsule (18 mcg total) into inhaler and inhale daily. 10/22/12  Yes Penny Pia, MD   Allergies  Allergen Reactions  . Ceclor (Cefaclor) Anaphylaxis    Recently took pcn for tooth problems, without issues  . Morphine And Related Other (See Comments)    Abnormal behavior    FAMILY HISTORY:  Family History  Problem Relation Age of Onset  . Alzheimer's disease Mother   . Heart failure Mother   . Heart disease Mother   . Cancer Father     prostate  . Heart disease Father   . Heart attack Father   . Cancer Brother     prostate  . Stroke Brother    SOCIAL HISTORY:  reports that he has been smoking Cigarettes.  He has a 50 pack-year smoking history. He has never used smokeless tobacco. He reports that he drinks about 4.2 ounces of alcohol per week. He reports that he does not use illicit drugs.  REVIEW OF SYSTEMS:   12 point ROS negative other than mentioned above.  SUBJECTIVE: not lethargic at time of interview this afternoon.  VITAL SIGNS: Temp:  [97.2 F (36.2 C)-98 F (36.7 C)] 98 F (36.7 C) (07/14 1300) Pulse Rate:  [69-89] 70 (07/14 1300) Resp:  [18-20] 20 (07/14 1300) BP: (122-155)/(53-69) 122/59 mmHg (07/14 1300) SpO2:  [96 %-99 %] 96 % (07/14 1300) Weight:  [56.155 kg (123 lb 12.8 oz)]  56.155 kg (123 lb 12.8 oz) (07/14 0538)  PHYSICAL EXAMINATION: General:  Chronically ill appearing male, mild respiratory distress. Neuro:  Awake but lethargic, moves all ext to command. HEENT:  [place]/AT, PERRL, EOM-I and MMM. Neck:  Supple, +JVD and neck mass left of midline. Cardiovascular:  RRR, nl S1/S2, -M/R/G. Lungs:  Distant with bibasilar crackles. Abdomen:  Soft, NT, ND and +BS. Musculoskeletal:  2+ edema bilaterally. Skin:  Intact.   Recent Labs Lab 12/31/12 0425 01/01/13 0350 01/02/13 4098  NA 132* 135 133*  K 3.6 3.6 3.4*  CL 88* 91* 89*  CO2 38* 38* 36*  BUN 33* 36* 37*  CREATININE 1.40* 1.39* 1.41*  GLUCOSE 140* 100* 94    Recent Labs Lab 12/29/12 1421 01/01/13 0350  HGB 9.3* 8.7*  HCT 29.8* 27.1*  WBC 10.3 12.5*  PLT 358 332   Mr Mra Head Wo Contrast  01/02/2013   *RADIOLOGY REPORT*  Clinical Data: Altered mental status, atrial fibrillation, hypertension, and hyperlipidemia.  Right occipital CVA.  MRA HEAD WITHOUT CONTRAST  Technique: Angiographic images of the Circle of Willis were obtained using MRA technique without intravenous contrast.  Comparison: 01/01/2013 MRI.  Findings: There is absent flow related enhancement in the upper cervical, petrous, cavernous internal carotid artery on the left. There is some reconstitution of the upper cavernous and supraclinoid internal carotid artery perhaps collateral flow from the left ophthalmic and/or retrograde flow via cross fill.  There is a small rounded structure in the expected region of the posterior communicating artery origin on the left.  This measures up to 3 mm and could represent a small berry aneurysm or infundibulum.  The right internal carotid artery shows wide patency in its upper cervical and petrous segments.  There is mild nonstenotic irregularity in the inferior RICA cavernous segment.  The supraclinoid ICA on the right is mildly irregular.  The basilar artery is patent with the right vertebral the  dominant contributor.  The left vertebral does contribute to the basilar. There is smooth irregularity of the distal right vertebral without focal stenosis.  Focal irregularity of the distal left vertebral could represent a 50-75% stenosis (arrow).  Mid basilar stenosis between the superior cerebellar arteries and the anterior inferior cerebellar arteries approaches 50%.  The right anterior and middle cerebral arteries are widely patent proximally.  There is mild irregularity of the proximal left ACA and MCA. Diminished relative flow related enhancement left anterior circulation reflects carotid occlusion.  50% stenosis ambient segment right PCA.  Patent but disease bilateral superior cerebellar arteries.  Poorly visualized PICAs and AICAs. Moderate irregularity distal MCA branches bilaterally.  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  Intracranial atherosclerotic change as described.   Original Report Authenticated By: Davonna Belling, M.D.   Mr Laqueta Jean Wo Contrast  01/01/2013   *RADIOLOGY REPORT*  Clinical Data: Altered mental status.  History of high blood pressure and hyperlipidemia.  Thyroid cancer.  MRI HEAD WITHOUT AND WITH CONTRAST  Technique:  Multiplanar, multiecho pulse sequences of the brain and surrounding structures were obtained according to standard protocol without and with intravenous contrast  Contrast: 10mL MULTIHANCE GADOBENATE DIMEGLUMINE 529 MG/ML IV SOLN  Comparison: 11/26/2003 head CT.  No comparison brain MR.  Findings: Motion degraded exam.   Tiny acute right occipital lobe infarct.  Remote infarct with blood stained encephalomalacia left caudate head and subsequent mild dilation of the adjacent left lateral ventricle.  Remote small thalamic infarcts larger on the left.  Moderate small vessel disease type changes.  Atrophy with ventricular  prominence felt to be related to atrophy rather hydrocephalus.  Left internal carotid artery appears to be occluded or significantly narrowed with reconstitution of flow supraclinoid region.  Transverse ligament hypertrophy.  Cervical medullary junction unremarkable.  Partial empty sella incidentally noted.  No intracranial mass or abnormal enhancement noted on the significantly motion degraded images.  IMPRESSION: Motion degraded exam.  Tiny acute right occipital lobe infarct.  Remote infarct with blood stained encephalomalacia left caudate head and subsequent mild dilation of the adjacent left lateral ventricle.  Remote small thalamic infarcts larger on the left.  Moderate small vessel disease type changes.  Left internal carotid artery appears to be occluded or significantly narrowed with reconstitution of flow supraclinoid region.  Critical Value/emergent results were called by telephone at the time of interpretation on 01/01/2013 at 2:30 p.m. to [facility] patient's nurse, who verbally acknowledged these results.   Original Report Authenticated By: Lacy Duverney, M.D.    ASSESSMENT / PLAN:  72 year old male with COPD history 2L [place], consult for early morning somnolence and COPD management.  Early morning somnolence could possibly due to early morning CO2 narcosis.  Neuro is in the process of working him up.  SOB is likely multi-factorial with COPD and heart failure.  Patient is DNR. Plan: - Agree with current bronchodilator regiment. - No need for treatment as acute exacerbation of COPD, no need for steroids and abx (patient currently on amoxacillin, not sure why). - Continue diureses as tolerated per cards. - Will order an ABG in AM, if evidence of decompensated CO2 narcosis then will need to consider nocturnal BiPAP. - Will f/u in AM to check ABG. - Dr. Delton Coombes to see in AM (his office patient).  Alyson Reedy, M.D. Pulmonary and Critical Care Medicine [facility] Pager: [phone]  01/02/2013, 4:26 PM

## 2013-01-02 NOTE — Progress Notes (Addendum)
VASCULAR LAB PRELIMINARY  PRELIMINARY  PRELIMINARY  PRELIMINARY  Carotid Dopplers completed.    Preliminary report: There is 0-39% right ICA stenosis.  The left CCA and  ICA appear occluded. Right vertebral artery flow is antegrade.  Yehuda Printup, RVT 01/02/2013, 3:39 PM

## 2013-01-02 NOTE — Evaluation (Signed)
Clinical/Bedside Swallow Evaluation Patient Details  Name: [name] [name] MRN: [id number] Date of Birth: 09-Aug-1940  Today's Date: 01/02/2013 Time: 4098-1191 SLP Time Calculation (min): 24 min  Past Medical History:  Past Medical History  Diagnosis Date  . Hypertension   . Hyperlipidemia   . Peripheral vascular disease     Hx of Ax-fem BPG, known LCCA disease  . CHF (congestive heart failure)     Ischemic cardiomyopathy EF 30 - 35%  . Cancer     skin cancer, Thyroid cancer  . Stroke 2004    minor  . Dysrhythmia     PAF  . COPD (chronic obstructive pulmonary disease)     PT DENIES USES O2 2 LITERS HS  . Heart murmur     Mild-moderate MR  . Hepatitis     AT AGE  42; unknown type    . Heart attack     03/17/12; subtotally occluded RCI with unsucessful PCI  . Coronary artery disease     Cardiologist is Dr. Nanetta Batty   . Thyroid cancer     Papillary  . Renal insufficiency    Past Surgical History:  Past Surgical History  Procedure Laterality Date  . Bypass graft  07/2010    axillobifemoral BPG  . Spine surgery  11/23/06    microdiscectomy L5-S1  . Hemorrhoid surgery    . Tonsillectomy    . Hernia repair  02/09/11    [facility]  . Cardiac catheterization      2013  . Coronary angioplasty      2013  . Thyroidectomy Bilateral 10/07/2012    Procedure: THYROIDECTOMY;  Surgeon: Darletta Moll, MD;  Location: [facility] OR;  Service: ENT;  Laterality: Bilateral;  . Radical neck dissection Left 10/07/2012    Procedure: RADICAL NECK DISSECTION;  Surgeon: Darletta Moll, MD;  Location: [facility] OR;  Service: ENT;  Laterality: Left;   HPI:  [name] [name] is a 72 y.o. male presenting with shortness of breath, altered mental status but no focal neurologic deficits. Imaging confirms a tiny right inferior occipital acute infarct. Infarct felt to be an incidental finding. Etiology unclear - no related to either old or newly identified ICA stenosis/occlusion as it is in a different vascular  distribution. Pt also with CHF, though to be cause of respriatory issues. Pt has a history of thyroid CA with hyroidectomy and radical neck dissection 10/07/12. History of dysphagia. MBS done on 6/19 reports moderate pharyngeal dysphagia, rec Dys 3/nectar.   Assessment / Plan / Recommendation Clinical Impression  Pt continues to tolerate nectar thick liquids, consistent with finding on last MBS. Pts family reports noncompliance with thickener at home. Would like to help progress pt off of nectar, but likely pts dysphagia is related to history of dissection and to respiratory funciton, now acutely worsened. Unlikely that pt will make stable progress in acute setting even with therapeutic exercise; Pt will need to continue thickening liquids into longer term. Pt reports that home health SLP did not pick him up for therapy. SLP will follow pt acutely, briefly to reinforce precautions and monitor for signs of tolerance with thin textures through prognosis for improvement poor.     Aspiration Risk  Moderate    Diet Recommendation Dysphagia 3 (Mechanical Soft);Nectar-thick liquid   Liquid Administration via: Cup;Straw Medication Administration: Whole meds with puree Supervision: Patient able to self feed;Intermittent supervision to cue for compensatory strategies Compensations: Slow rate;Small sips/bites Postural Changes and/or Swallow Maneuvers: Seated upright 90 degrees;Upright  30-60 min after meal    Other  Recommendations Oral Care Recommendations: Oral care BID Other Recommendations: Order thickener from pharmacy;Clarify dietary restrictions   Follow Up Recommendations  24 hour supervision/assistance    Frequency and Duration min 2x/week  1 week   Pertinent Vitals/Pain NA    SLP Swallow Goals Patient will utilize recommended strategies during swallow to increase swallowing safety with: Supervision/safety Swallow Study Goal #2 - Progress: Progressing toward goal   Swallow Study Prior  Functional Status       General HPI: [name] [name] is a 72 y.o. male presenting with shortness of breath, altered mental status but no focal neurologic deficits. Imaging confirms a tiny right inferior occipital acute infarct. Infarct felt to be an incidental finding. Etiology unclear - no related to either old or newly identified ICA stenosis/occlusion as it is in a different vascular distribution. Pt also with CHF, though to be cause of respriatory issues. Pt has a history of thyroid CA with hyroidectomy and radical neck dissection 10/07/12. History of dysphagia. MBS done on 6/19 reports moderate pharyngeal dysphagia, rec Dys 3/nectar. Type of Study: Bedside swallow evaluation Previous Swallow Assessment: MBS 6/19 Diet Prior to this Study: Dysphagia 3 (soft);Nectar-thick liquids Temperature Spikes Noted: No Respiratory Status: Supplemental O2 delivered via (comment) History of Recent Intubation: No Behavior/Cognition: Alert;Cooperative;Hard of hearing;Pleasant mood Oral Cavity - Dentition: Adequate natural dentition Self-Feeding Abilities: Able to feed self Patient Positioning: Upright in bed Baseline Vocal Quality: Hoarse Volitional Cough: Weak Volitional Swallow: Able to elicit    Oral/Motor/Sensory Function Overall Oral Motor/Sensory Function: Appears within functional limits for tasks assessed   Ice Chips     Thin Liquid Thin Liquid: Not tested    Nectar Thick Nectar Thick Liquid: Within functional limits   Honey Thick Honey Thick Liquid: Not tested   Puree Puree: Not tested   Solid   GO    Solid: Not tested      Harlon Ditty, MA CCC-SLP [phone]  Claudine Mouton 01/02/2013,4:21 PM

## 2013-01-02 NOTE — Progress Notes (Signed)
*  PRELIMINARY RESULTS* Echocardiogram 2D Echocardiogram has been performed.  [name] [name] 01/02/2013, 3:33 PM

## 2013-01-02 NOTE — Progress Notes (Addendum)
[facility] PROGRESS NOTE  [name] [name] ZOX:[id number] DOB: 07/30/1940 DOA: 12/29/2012 PCP: [name], MD  Assessment/Plan: Acute on chronic respiratory failure  -I believe this to be multifactorial the majority of which is due to decompensated CHF - Pt JVD on admission and ProBNP [phone]  - cards continuing IV lasix  -V/Q scan neg for PE -Repeat ABG am 7/11 improved and pt was alert -No abx started initially as pt just completed a full course of levaquin on 7/9, and he is afebrile with no leukocytosis>> repeat CXR uchanged from7/11- likely residual from recent PNA- Pt with no significant cough, he is afebrile with no - leukocytosis -on follow up today has cough productive of yellowish sputum>> will add augmentin and follow Acute on chronic combine CHF  -troponins neg -continue Furosemide as above, about 1L neg overnight  -appreciate cards input -[facility] following -Continue metoprolol succinate, hydralazine, imdur  -10/17/2012 echocardiogram shows EF 30-35%, grade 2 diastolic dysfunction  COPD  -No active wheezing at this time  -Continue Spiriva, albuterol &Pulmicort nebulizers  -augmentin added as above as cough productive of yellowish sputum -taper steroids Papillary thyroid carcinoma  -pt has been followed by Dr. Talmage Nap  -scheduled for radioactive iodine on 01/11/13  -PET scan on 12/15/12 showed hypermetabolic supraclavicular, retropectoral, and superior mediastinal lymph nodes with increased uptake in the right lower lobe nodule  -continue low iodine diet  CKD stage III  -stable  -Baseline creatinine 1.3-1.6  Hypokalemia -replace k   Pt with intermittent AMS- last ABG with ph7.45/ co2 54.7/po2 63.1 - mild CO2 elevation unlikely to be the cause, ammonia level wnl, and Na wnl - MRI obtained today with tiny R.occipital infarct- pt is already on ASA  An plavix, I have consulted [facility] for further recs- Dr Roseanne Reno to see for further recs   Code Status:  DNR Family Communication: Daughter  at bedside Disposition Plan: to home when medically stable   Consultants:  cards  Procedures:  none  Antibiotics:  none  HPI/Subjective: Pt  Sleepy but easily aroused, per daughter some confusion also noted intermittently. denies CP  Objective: Filed Vitals:   01/01/13 2057 01/01/13 2058 01/02/13 0538 01/02/13 0825  BP: 135/69 145/60 155/69   Pulse: 89  71   Temp: 97.2 F (36.2 C)  98 F (36.7 C)   TempSrc: Oral  Oral   Resp: 18  20   Height:      Weight:   56.155 kg (123 lb 12.8 oz)   SpO2: 99%  96% 96%    Intake/Output Summary (Last 24 hours) at 01/02/13 1019 Last data filed at 01/02/13 0836  Gross per 24 hour  Intake    758 ml  Output    400 ml  Net    358 ml   Filed Weights   12/31/12 0505 01/01/13 0533 01/02/13 0538  Weight: 56.518 kg (124 lb 9.6 oz) 56.2 kg (123 lb 14.4 oz) 56.155 kg (123 lb 12.8 oz)   Exam:  General: sleepy but easily aroused In NAD Cardiovascular: RRR, nl S1 s2 Respiratory: moderate air mov't bil, no wheezes, resp unlabored Abdomen: soft +BS NT/ND, no masses palpable Extremities: No cyanosis and no edema    Data Reviewed: Basic Metabolic Panel:  Recent Labs Lab 12/29/12 1421 12/30/12 0157 12/31/12 0425 01/01/13 0350 01/02/13 0610  NA 137 139 132* 135 133*  K 3.3* 3.9 3.6 3.6 3.4*  CL 90* 92* 88* 91* 89*  CO2 36* 39* 38* 38* 36*  GLUCOSE 87 123* 140*  100* 94  BUN 25* 24* 33* 36* 37*  CREATININE 1.47* 1.45* 1.40* 1.39* 1.41*  CALCIUM 10.9* 10.6* 10.1 10.4 10.2   Liver Function Tests: No results found for this basename: AST, ALT, ALKPHOS, BILITOT, PROT, ALBUMIN,  in the last 168 hours No results found for this basename: LIPASE, AMYLASE,  in the last 168 hours  Recent Labs Lab 01/09/13 0355  AMMONIA 29   CBC:  Recent Labs Lab 12/29/12 1421 01-09-13 0350  WBC 10.3 12.5*  NEUTROABS 7.4  --   HGB 9.3* 8.7*  HCT 29.8* 27.1*  MCV 82.3 80.2  PLT 358 332   Cardiac  Enzymes:  Recent Labs Lab 12/29/12 1426 12/30/12 0012  TROPONINI <0.30 <0.30   BNP (last 3 results)  Recent Labs  12/29/12 1426 January 09, 2013 0350 01/02/13 0610  PROBNP 46080.0* 33652.0* 34244.0*   CBG: No results found for this basename: GLUCAP,  in the last 168 hours  Recent Results (from the past 240 hour(s))  CULTURE, BLOOD (ROUTINE X 2)     Status: None   Collection Time    12/29/12  3:57 PM      Result Value Range Status   Specimen Description BLOOD HAND RIGHT   Final   Special Requests BOTTLES DRAWN AEROBIC ONLY 2.5CC   Final   Culture  Setup Time 12/29/2012 23:13   Final   Culture     Final   Value:        BLOOD CULTURE RECEIVED NO GROWTH TO DATE CULTURE WILL BE HELD FOR 5 DAYS BEFORE ISSUING A FINAL NEGATIVE REPORT   Report Status PENDING   Incomplete  CULTURE, BLOOD (ROUTINE X 2)     Status: None   Collection Time    12/29/12  4:03 PM      Result Value Range Status   Specimen Description BLOOD ARM LEFT   Final   Special Requests BOTTLES DRAWN AEROBIC ONLY 10CC   Final   Culture  Setup Time 12/29/2012 23:13   Final   Culture     Final   Value:        BLOOD CULTURE RECEIVED NO GROWTH TO DATE CULTURE WILL BE HELD FOR 5 DAYS BEFORE ISSUING A FINAL NEGATIVE REPORT   Report Status PENDING   Incomplete     Studies: Mr Lodema Pilot Contrast  January 09, 2013   *RADIOLOGY REPORT*  Clinical Data: Altered mental status.  History of high blood pressure and hyperlipidemia.  Thyroid cancer.  MRI HEAD WITHOUT AND WITH CONTRAST  Technique:  Multiplanar, multiecho pulse sequences of the brain and surrounding structures were obtained according to standard protocol without and with intravenous contrast  Contrast: 10mL MULTIHANCE GADOBENATE DIMEGLUMINE 529 MG/ML IV SOLN  Comparison: 11/26/2003 head CT.  No comparison brain MR.  Findings: Motion degraded exam.   Tiny acute right occipital lobe infarct.  Remote infarct with blood stained encephalomalacia left caudate head and subsequent mild  dilation of the adjacent left lateral ventricle.  Remote small thalamic infarcts larger on the left.  Moderate small vessel disease type changes.  Atrophy with ventricular prominence felt to be related to atrophy rather hydrocephalus.  Left internal carotid artery appears to be occluded or significantly narrowed with reconstitution of flow supraclinoid region.  Transverse ligament hypertrophy.  Cervical medullary junction unremarkable.  Partial empty sella incidentally noted.  No intracranial mass or abnormal enhancement noted on the significantly motion degraded images.  IMPRESSION: Motion degraded exam.  Tiny acute right occipital lobe infarct.  Remote infarct with  blood stained encephalomalacia left caudate head and subsequent mild dilation of the adjacent left lateral ventricle.  Remote small thalamic infarcts larger on the left.  Moderate small vessel disease type changes.  Left internal carotid artery appears to be occluded or significantly narrowed with reconstitution of flow supraclinoid region.  Critical Value/emergent results were called by telephone at the time of interpretation on 01/01/2013 at 2:30 p.m. to [facility] patient's nurse, who verbally acknowledged these results.   Original Report Authenticated By: Lacy Duverney, M.D.    Scheduled Meds: . albuterol  2.5 mg Nebulization QID  . aspirin EC  81 mg Oral Daily  . budesonide  0.25 mg Nebulization QID  . calcitRIOL  0.5 mcg Oral Daily  . calcium carbonate  1 tablet Oral TID WC & HS  . clopidogrel  75 mg Oral Daily  . digoxin  0.0625 mg Oral Daily  . feeding supplement  1 Container Oral TID BM  . furosemide  40 mg Intravenous BID  . heparin  5,000 Units Subcutaneous Q8H  . hydrALAZINE  75 mg Oral TID  . isosorbide mononitrate  60 mg Oral Daily  . levothyroxine  125 mcg Oral QAC breakfast  . metoprolol succinate  25 mg Oral Daily  . pantoprazole  40 mg Oral QAC breakfast  . potassium chloride  10 mEq Oral Daily  . potassium chloride  40  mEq Oral Once  . potassium chloride  40 mEq Oral Once  . predniSONE  60 mg Oral Q breakfast  . sodium chloride  3 mL Intravenous Q12H  . tiotropium  18 mcg Inhalation Daily   Continuous Infusions:   Principal Problem:   Acute and chronic respiratory failure Active Problems:   COPD (chronic obstructive pulmonary disease)   Ischemic cardiomyopathy, EF 30-35% by echo 03/29/12 - improved to 50-55% by 05/2012, but back to 35% 09/2012   Pericardial effusion, moderate on echo 03/29/12, continues to be moderate this admit   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   Protein-calorie malnutrition, severe   CVA (cerebral infarction)    Time spent: 25    [facility]  [facility] Pager [phone]. If 7PM-7AM, please contact night-coverage at [email or web address], password [facility] 01/02/2013, 10:19 AM  LOS: 4 days

## 2013-01-02 NOTE — Progress Notes (Signed)
Stroke Team Progress Note  HISTORY [name] [name] is an 72 y.o. male with a history of hypertension, hyperlipidemia, peripheral vascular disease, congestive heart failure, coronary artery disease and previous stroke, being evaluated by neurology because of abnormal MRI study with small right inferior occipital acute stroke. 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 Patient has had altered mental status but no focal deficits have been noted. Onset is unclear. Patient has been on aspirin and Plavix daily. NIH stroke score was 2. Patient was not a TPA candidate secondary to minimal deficits and unknown time of onset . He was admitted for further evaluation and treatment.  SUBJECTIVE His daughter is at the bedside.  Overall he feels his condition is stable for now - though he is having a little more trouble breathing.  OBJECTIVE Most recent Vital Signs: Filed Vitals:   01/01/13 2058 01/02/13 0538 01/02/13 0825 01/02/13 1037  BP: 145/60 155/69  149/53  Pulse:  71  69  Temp:  98 F (36.7 C)    TempSrc:  Oral    Resp:  20    Height:      Weight:  56.155 kg (123 lb 12.8 oz)    SpO2:  96% 96% 96%   CBG (last 3)  No results found for this basename: GLUCAP,  in the last 72 hours  IV Fluid Intake:     MEDICATIONS  . albuterol  2.5 mg Nebulization QID  . amoxicillin-clavulanate  1 tablet Oral Q12H  . aspirin EC  81 mg Oral Daily  . budesonide  0.25 mg Nebulization QID  . calcitRIOL  0.5 mcg Oral Daily  . calcium carbonate  1 tablet Oral TID WC & HS  . clopidogrel  75 mg Oral Daily  . digoxin  0.0625 mg Oral Daily  . feeding supplement  1 Container Oral TID BM  . furosemide  40 mg Intravenous BID  . heparin  5,000 Units Subcutaneous Q8H  . hydrALAZINE  75 mg Oral TID  . isosorbide mononitrate  60 mg Oral Daily  . levothyroxine  125 mcg Oral QAC breakfast  . metoprolol succinate  25 mg Oral Daily  . pantoprazole  40 mg Oral QAC  breakfast  . potassium chloride  10 mEq Oral Daily  . potassium chloride  40 mEq Oral Once  . potassium chloride  40 mEq Oral Once  . predniSONE  60 mg Oral Q breakfast  . sodium chloride  3 mL Intravenous Q12H  . tiotropium  18 mcg Inhalation Daily   PRN:  sodium chloride, acetaminophen, albuterol, nitroGLYCERIN, ondansetron (ZOFRAN) IV, oxyCODONE-acetaminophen, RESOURCE THICKENUP CLEAR, sodium chloride  Diet:  Dysphagia 3 nectar thick liquids Activity:  Bedrest, per HF protocol  DVT Prophylaxis:  Heparin 5000 units sq tid   CLINICALLY SIGNIFICANT STUDIES Basic Metabolic Panel:   Recent Labs Lab 01/01/13 0350 01/02/13 0610  NA 135 133*  K 3.6 3.4*  CL 91* 89*  CO2 38* 36*  GLUCOSE 100* 94  BUN 36* 37*  CREATININE 1.39* 1.41*  CALCIUM 10.4 10.2   Liver Function Tests: No results found for this basename: AST, ALT, ALKPHOS, BILITOT, PROT, ALBUMIN,  in the last 168 hours CBC:   Recent Labs Lab 12/29/12 1421 01/01/13 0350  WBC 10.3 12.5*  NEUTROABS 7.4  --   HGB 9.3* 8.7*  HCT 29.8* 27.1*  MCV 82.3 80.2  PLT 358 332   Coagulation: No results found for this basename: LABPROT, INR,  in  the last 168 hours Cardiac Enzymes:   Recent Labs Lab 12/29/12 1426 12/30/12 0012  TROPONINI <0.30 <0.30   Urinalysis:   Recent Labs Lab 12/29/12 1525  COLORURINE YELLOW  LABSPEC 1.008  PHURINE 7.5  GLUCOSEU NEGATIVE  HGBUR NEGATIVE  BILIRUBINUR NEGATIVE  KETONESUR NEGATIVE  PROTEINUR NEGATIVE  UROBILINOGEN 0.2  NITRITE NEGATIVE  LEUKOCYTESUR NEGATIVE   Lipid Panel    Component Value Date/Time   CHOL 103 03/30/2012 1416   TRIG 85 03/17/2012 0530   HDL 48 03/17/2012 0530   CHOLHDL 2.6 03/17/2012 0530   VLDL 17 03/17/2012 0530   LDLCALC 58 03/17/2012 0530   HgbA1C  Lab Results  Component Value Date   HGBA1C 5.4 03/17/2012    Urine Drug Screen:   No results found for this basename: labopia,  cocainscrnur,  labbenz,  amphetmu,  thcu,  labbarb    Alcohol Level: No  results found for this basename: ETH,  in the last 168 hours   CT of the brain    MRI of the brain  01/01/2013  Motion degraded exam.  Tiny acute right occipital lobe infarct.  Remote infarct with blood stained encephalomalacia left caudate head and subsequent mild dilation of the adjacent left lateral ventricle.  Remote small thalamic infarcts larger on the left.  Moderate small vessel disease type changes.  Left internal carotid artery appears to be occluded or significantly narrowed with reconstitution of flow supraclinoid region.    MRA of the brain    2D Echocardiogram    Carotid Doppler    CXR   12/31/2012 Persistent suggestion of left lower lobe infiltrate with associated small left pleural effusion. 12/29/2012 1. Abnormal aeration at the left base, suspicious for pneumonia with small pleural effusion. 2. Numerous nonacute, but potentially unhealed upper right rib fractures.  EKG  sinus bradycardia, 57  Therapy Recommendations   Physical Exam   Pleasant elderly Caucasian male currently not in distress.Awake alert. Afebrile. Head is nontraumatic. Neck is supple without bruit. Hearing is normal. Cardiac exam no murmur or gallop. Lungs are clear to auscultation. Distal pulses are well felt. Neurological Exam :  Awake  Alert oriented x 3. Diminished recall and attention. Normal speech and language.eye movements full without nystagmus.fundi were not visualized. Vision acuity and fields appear normal. Hearing is normal. Palatal movements are normal. Face symmetric. Tongue midline. Normal strength, tone, reflexes and coordination. Normal sensation. Gait deferred. ASSESSMENT [name] [name] is a 72 y.o. male presenting with altered mental status but no focal neurologic deficits. Imaging confirms a  tiny right inferior occipital acute infarct. Infarct felt to be an incidental finding. Etiology unclear - no related to either old or newly identified ICA stenosis/occlusion as it is in a  different vascular distribution.   On aspirin 81 mg orally every day and clopidogrel 75 mg orally every day prior to admission. Now on aspirin 81 mg orally every day and clopidogrel 75 mg orally every day for secondary stroke prevention. Patient with no resultant visual field defects. Work up underway.   ? Left ICA occlusion per MRI - MRI not always reliable, need to look at further atrial fibrillation  Hypertension Hyperlipidemia, LDL 58, on no statin PTA, now on no statin, goal LDL < 100 (< 70 for diabetics) PVD CHR w/ er 30-35% Hx stroke COPD RI  Hospital day # 4  TREATMENT/PLAN  Continue aspirin 81 mg orally every day and clopidogrel 75 mg orally every day for secondary stroke prevention.  F/u MRA, 2D  and carotid doppler  Patient with no PT, OT needs from the neuro perspective.  [name], MSN, RN, ANVP-BC, ANP-BC, [facility] Pager: [phone] 01/02/2013 10:53 AM  I have personally obtained a history, examined the patient, evaluated imaging results, and formulated the assessment and plan of care. I agree with the above. Delia Heady, MD

## 2013-01-03 DIAGNOSIS — R0902 Hypoxemia: Secondary | ICD-10-CM

## 2013-01-03 DIAGNOSIS — G9340 Encephalopathy, unspecified: Secondary | ICD-10-CM

## 2013-01-03 DIAGNOSIS — I319 Disease of pericardium, unspecified: Secondary | ICD-10-CM

## 2013-01-03 DIAGNOSIS — E871 Hypo-osmolality and hyponatremia: Secondary | ICD-10-CM

## 2013-01-03 LAB — BASIC METABOLIC PANEL
BUN: 34 mg/dL — ABNORMAL HIGH (ref 6–23)
Chloride: 92 mEq/L — ABNORMAL LOW (ref 96–112)
Glucose, Bld: 89 mg/dL (ref 70–99)
Potassium: 3.7 mEq/L (ref 3.5–5.1)
Sodium: 139 mEq/L (ref 135–145)

## 2013-01-03 LAB — BLOOD GAS, ARTERIAL
Acid-Base Excess: 12.8 mmol/L — ABNORMAL HIGH (ref 0.0–2.0)
Bicarbonate: 37.4 mEq/L — ABNORMAL HIGH (ref 20.0–24.0)
O2 Saturation: 93.2 %
TCO2: 39.1 mmol/L (ref 0–100)
pCO2 arterial: 53.4 mmHg — ABNORMAL HIGH (ref 35.0–45.0)
pO2, Arterial: 68.3 mmHg — ABNORMAL LOW (ref 80.0–100.0)

## 2013-01-03 MED ORDER — BISACODYL 5 MG PO TBEC: 10.0000 mg | DELAYED_RELEASE_TABLET | Freq: Every day | ORAL | Status: DC | PRN

## 2013-01-03 MED ORDER — SENNA 8.6 MG PO TABS
1.0000 | ORAL_TABLET | Freq: Every day | ORAL | Status: DC
  Administered 2013-01-03 – 2013-01-05 (×3): 8.6 mg via ORAL
  Filled 2013-01-03 (×5): qty 1

## 2013-01-03 NOTE — Progress Notes (Signed)
Stroke Team Progress Note  HISTORY [name] [name] is an 72 y.o. male with a history of hypertension, hyperlipidemia, peripheral vascular disease, congestive heart failure, coronary artery disease and previous stroke, being evaluated by neurology because of abnormal MRI study with small right inferior occipital acute stroke. 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 Patient has had altered mental status but no focal deficits have been noted. Onset is unclear. Patient has been on aspirin and Plavix daily. NIH stroke score was 2. Patient was not a TPA candidate secondary to minimal deficits and unknown time of onset . He was admitted for further evaluation and treatment.  SUBJECTIVE His daughter is at the bedside.  Patient was in the bathroom when we initially arrived. He came out very awake, talkative, but obviously SOB.  OBJECTIVE Most recent Vital Signs: Filed Vitals:   01/02/13 2104 01/03/13 0500 01/03/13 0609 01/03/13 0900  BP: 120/63  110/57   Pulse: 86  80   Temp: 98.8 F (37.1 C)  98.4 F (36.9 C)   TempSrc: Oral  Oral   Resp: 20  18   Height:      Weight:  55.838 kg (123 lb 1.6 oz) 55.838 kg (123 lb 1.6 oz)   SpO2: 94%  95% 98%   CBG (last 3)  No results found for this basename: GLUCAP,  in the last 72 hours  IV Fluid Intake:     MEDICATIONS  . albuterol  2.5 mg Nebulization QID  . amoxicillin-clavulanate  1 tablet Oral Q12H  . aspirin EC  81 mg Oral Daily  . budesonide  0.25 mg Nebulization QID  . calcitRIOL  0.5 mcg Oral Daily  . calcium carbonate  1 tablet Oral TID WC & HS  . clopidogrel  75 mg Oral Daily  . digoxin  0.0625 mg Oral Daily  . feeding supplement  1 Container Oral TID BM  . furosemide  40 mg Intravenous BID  . heparin  5,000 Units Subcutaneous Q8H  . hydrALAZINE  75 mg Oral TID  . isosorbide mononitrate  60 mg Oral Daily  . levothyroxine  125 mcg Oral QAC breakfast  . metoprolol succinate  25  mg Oral Daily  . pantoprazole  40 mg Oral QAC breakfast  . potassium chloride  10 mEq Oral Daily  . potassium chloride  40 mEq Oral Once  . predniSONE  40 mg Oral Q breakfast  . sodium chloride  3 mL Intravenous Q12H  . tiotropium  18 mcg Inhalation Daily   PRN:  sodium chloride, acetaminophen, albuterol, nitroGLYCERIN, ondansetron (ZOFRAN) IV, oxyCODONE-acetaminophen, RESOURCE THICKENUP CLEAR, sodium chloride  Diet:  Dysphagia 3 nectar thick liquids Activity:  Bedrest, per HF protocol  DVT Prophylaxis:  Heparin 5000 units sq tid   CLINICALLY SIGNIFICANT STUDIES Basic Metabolic Panel:   Recent Labs Lab 01/02/13 0610 01/03/13 0719  NA 133* 139  K 3.4* 3.7  CL 89* 92*  CO2 36* 38*  GLUCOSE 94 89  BUN 37* 34*  CREATININE 1.41* 1.43*  CALCIUM 10.2 9.6   Liver Function Tests: No results found for this basename: AST, ALT, ALKPHOS, BILITOT, PROT, ALBUMIN,  in the last 168 hours CBC:   Recent Labs Lab 12/29/12 1421 01/01/13 0350  WBC 10.3 12.5*  NEUTROABS 7.4  --   HGB 9.3* 8.7*  HCT 29.8* 27.1*  MCV 82.3 80.2  PLT 358 332   Coagulation: No results found for this basename: LABPROT, INR,  in the last  168 hours Cardiac Enzymes:   Recent Labs Lab 12/29/12 1426 12/30/12 0012  TROPONINI <0.30 <0.30   Urinalysis:   Recent Labs Lab 12/29/12 1525  COLORURINE YELLOW  LABSPEC 1.008  PHURINE 7.5  GLUCOSEU NEGATIVE  HGBUR NEGATIVE  BILIRUBINUR NEGATIVE  KETONESUR NEGATIVE  PROTEINUR NEGATIVE  UROBILINOGEN 0.2  NITRITE NEGATIVE  LEUKOCYTESUR NEGATIVE   Lipid Panel    Component Value Date/Time   CHOL 170 01/02/2013 1053   TRIG 86 01/02/2013 1053   HDL 45 01/02/2013 1053   CHOLHDL 3.8 01/02/2013 1053   VLDL 17 01/02/2013 1053   LDLCALC 108* 01/02/2013 1053   HgbA1C  Lab Results  Component Value Date   HGBA1C 5.6 01/02/2013    Urine Drug Screen:   No results found for this basename: labopia,  cocainscrnur,  labbenz,  amphetmu,  thcu,  labbarb    Alcohol Level:  No results found for this basename: ETH,  in the last 168 hours   CT of the brain    MRI of the brain  01/01/2013  Motion degraded exam.  Tiny acute right occipital lobe infarct.  Remote infarct with blood stained encephalomalacia left caudate head and subsequent mild dilation of the adjacent left lateral ventricle.  Remote small thalamic infarcts larger on the left.  Moderate small vessel disease type changes.  Left internal carotid artery appears to be occluded or significantly narrowed with reconstitution of flow supraclinoid region.    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 Possible 2-3 mm left PCom aneurysm or  infundibulum. Intracranial atherosclerotic change.  2D Echocardiogram  EF 20-25% with no source of embolus. An atrial fibrillation rhythm was noted on this study, making this study technically difficult for the assessment of cardiac function. Frequent ectopy was noted on this study, making this study technically difficult for the assessment of cardiac function. Consistent with severe coronary artery disease, predominantly involving the RCA. Ischemic cardiomyopathy due to prior infarction, with normal right-sided filling pressure, elevated left-sided pressure, and reduced cardiac output. The dysfunction is both systolic and diastolic. No cardiac source of embolism was identified, but cannot be ruled out on the basis of this examination.  Carotid Doppler  There is 0-39% right ICA stenosis. The left CCA and ICA appear occluded. Right vertebral artery flow is antegrade.  CXR   12/31/2012 Persistent suggestion of left lower lobe infiltrate with associated small left pleural effusion. 12/29/2012 1. Abnormal aeration at the left base, suspicious for pneumonia with small pleural effusion. 2. Numerous nonacute, but potentially unhealed upper right rib fractures.  EKG  sinus bradycardia,  57  Therapy Recommendations   Physical Exam   Pleasant elderly Caucasian male currently not in distress.Awake alert. Afebrile. Head is nontraumatic. Neck is supple without bruit. Hearing is normal. Cardiac exam no murmur or gallop. Lungs are clear to auscultation. Distal pulses are well felt. Neurological Exam :  Awake  Alert oriented x 3. Diminished recall and attention. Normal speech and language.eye movements full without nystagmus.fundi were not visualized. Vision acuity and fields appear normal. Hearing is normal. Palatal movements are normal. Face symmetric. Tongue midline. Normal strength, tone, reflexes and coordination. Normal sensation. Gait deferred.  ASSESSMENT [name] is a 72 y.o. male presenting with altered mental status but no focal neurologic deficits. Imaging confirms a  tiny right inferior occipital acute infarct. Infarct felt to be an incidental finding, asymptomatic. Stroke embolic secondary to  noted atrial fibrillation in echo lab. Patient also with incidental left ICA occlusion, not related to current stroke as it is in a different vascular distribution.   On aspirin 81 mg orally every day and clopidogrel 75 mg orally every day prior to admission. Now on aspirin 81 mg orally every day and clopidogrel 75 mg orally every day for secondary stroke prevention. Patient with no resultant visual field defects or other neuro deficits. Stroke work up completed.  atrial fibrillation, per Dr. Herbie Baltimore, he thinks not on anticoagulation on admission due to fall risk - is deferring decision to Dr. Royann Shivers Hypertension Hyperlipidemia, LDL108, on no statin PTA, now on no statin, goal LDL < 100 (< 70 for diabetics) PVD CHR w/ er 30-35% Hx stroke COPD RI  Hospital day # 5  TREATMENT/PLAN  From stroke standpoint, recommend changing aspirin 81 mg orally every day and clopidogrel 75 mg orally every day to NOAC (Eliquis - less renal involvement) for secondary stroke prevention  given afib. If this is not ok, need documentation of contraindication linking afib to why anticoagulant was not used  Recommend low statin as LDL > 100 - please order if you are agreeable. If not, please document contraindication.  Patient with no PT, OT needs from the neuro perspective.  [name], MSN, RN, ANVP-BC, ANP-BC, [facility] Pager: [phone] 01/03/2013 10:03 AM  I have personally obtained a history, examined the patient, evaluated imaging results, and formulated the assessment and plan of care. I agree with the above. Delia Heady, MD

## 2013-01-03 NOTE — Progress Notes (Addendum)
[facility] PROGRESS NOTE  [name] [name] ION:[id number] DOB: Oct 23, 1940 DOA: 12/29/2012 PCP: [name], MD Brief History: Pt is a 72 year old male with a history of COPD (O2--2L), coronary artery disease, systolic and diastolic CHF, continued tobacco use, papillary thyroid carcinoma, CKD stage III, and hypertension who presented with 2-3 days of worsening shortness of breath. He complains of cough with white sputum. The patient was recently admitted to the hospital June 13-06/21/2014 for CHF exacerbation and between 10/15/2012-10/22/2012 For COPD exacerbation. He recently finished a course of levofloxacin the day prior to this admission. This was prescribed to him at the time of discharge in June. CXR showed Abnormal aeration at the left base, suspicious for pneumonia  with small pleural effusion.    Assessment/Plan:  Acute on chronic respiratory failure  -I believe this to be multifactorial the majority of which is due to decompensated CHF - Pt JVD on admission and ProBNP [id number]  - cards continuing IV lasix  -V/Q scan neg for PE -Repeat ABG am 7/11 improved and pt was alert -No abx started initially as pt just completed a full course of levaquin on 7/9, and he is afebrile with no leukocytosis>> repeat CXR uchanged from7/11- likely residual from recent PNA- Pt with no significant cough, he is afebrile with no - leukocytosis -on follow up pt with cough productive of yellowish sputum>>augmentin added, and was dc'ed today per CCM. Acute on chronic combine CHF  -troponins neg -continuing Furosemide as above, about 400cc neg overnight  -appreciate cards input -[facility] following -Continue metoprolol succinate, hydralazine, imdur  -10/17/2012 echocardiogram shows EF 30-35%, grade 2 diastolic dysfunction  -echo done 7/14 with EF20-30%, cards to follow for further recs COPD  -No active wheezing at this time  -Continue Spiriva, albuterol &Pulmicort nebulizers -augmentin and  prednisone dc'ed per PULM today, follow  Papillary thyroid carcinoma  -pt has been followed by Dr. Talmage Nap  -scheduled for radioactive iodine on 01/11/13  -PET scan on 12/15/12 showed hypermetabolic supraclavicular, retropectoral, and superior mediastinal lymph nodes with increased uptake in the right lower lobe nodule  -continue low iodine diet  CKD stage III  -stable  -Baseline creatinine 1.3-1.6  Hypokalemia -secondary to diuretics, repleted R.occipital infarct,Tiny   - appreciate Neuro input -on ASA and plavix which he was on prior to admission per cards - discussed with Dr Pearlean Brownie - he is recommending changing ASA&plavix to eliquis for secondary stroke pt, I left message for Dr Croitoru(in Cath lab) regarding this recommendation and he states he will pt's chart and leave note rgarding this tonight -see echo and cartid doppler results as noted below   Pt with intermittent AMS- last ABG with ph7.45/ co2 54.7/po2 63.1 - mild CO2 elevation unlikely to be the cause, ammonia level wnl, and Na wnl - MRI obtained today with tiny R.occipital infarct- pt is already on ASA and  plavix, NEURO was consulted for further recs and following.   Code Status: DNR Family Communication: updated Daughter  at bedside Disposition Plan: to home when medically stable   Consultants:  cards  Procedures:  Carotid doppler -The left CCA and ICA appear occluded ECHO - Left ventricle: The cavity size was at the upper limits of normal. Wall thickness was normal. Systolic function was normal. The estimated ejection fraction was 25%, in the range of 20% to 30%. Moderate diffuse hypokinesis. Doppler parameters are consistent with both elevated ventricular end-diastolic filling pressure and elevated left atrial filling pressure. - Regional wall motion abnormality: Akinesis and scarring of the basal-mid  inferior myocardium; severe hypokinesis of the mid inferoseptal and apical inferior myocardium;  mild hypokinesis of the basal-mid anterolateral and apical lateral myocardium  Impressions:  - An atrial fibrillation rhythm was noted on this study, making this study technically difficult for the assessment of cardiac function. Frequent ectopy was noted on this study, making this study technically difficult for the assessment of cardiac function. Consistent with severe coronary artery disease, predominantly involving the RCA. Ischemic cardiomyopathy due to prior infarction, with normal right-sided filling pressure, elevated left-sided pressure, and reduced cardiac output. The dysfunction is both systolic and diastolic. No cardiac source of embolism was identified, but cannot be ruled out on the basis of this examination.  Antibiotics:  none  HPI/Subjective: Pt  Sleepy but easily aroused, per daughter some confusion also noted intermittently. denies CP  Objective: Filed Vitals:   01/02/13 2104 01/03/13 0500 01/03/13 0609 01/03/13 0900  BP: 120/63  110/57   Pulse: 86  80   Temp: 98.8 F (37.1 C)  98.4 F (36.9 C)   TempSrc: Oral  Oral   Resp: 20  18   Height:      Weight:  55.838 kg (123 lb 1.6 oz) 55.838 kg (123 lb 1.6 oz)   SpO2: 94%  95% 98%    Intake/Output Summary (Last 24 hours) at 01/03/13 0923 Last data filed at 01/03/13 0612  Gross per 24 hour  Intake    483 ml  Output    900 ml  Net   -417 ml   Filed Weights   01/02/13 0538 01/03/13 0500 01/03/13 0609  Weight: 56.155 kg (123 lb 12.8 oz) 55.838 kg (123 lb 1.6 oz) 55.838 kg (123 lb 1.6 oz)   Exam:  General: alert and appropriate In NAD. Per daughter he was somnolent earlier. Cardiovascular: RRR, nl S1 s2 Respiratory: moderate air mov't bil, no wheezes, resp unlabored Abdomen: soft +BS NT/ND, no masses palpable Extremities: No cyanosis and no edema    Data Reviewed: Basic Metabolic Panel:  Recent Labs Lab 12/30/12 0157 12/31/12 0425 01/01/13 0350 01/02/13 0610 01/03/13 0719  NA 139 132*  135 133* 139  K 3.9 3.6 3.6 3.4* 3.7  CL 92* 88* 91* 89* 92*  CO2 39* 38* 38* 36* 38*  GLUCOSE 123* 140* 100* 94 89  BUN 24* 33* 36* 37* 34*  CREATININE 1.45* 1.40* 1.39* 1.41* 1.43*  CALCIUM 10.6* 10.1 10.4 10.2 9.6   Liver Function Tests: No results found for this basename: AST, ALT, ALKPHOS, BILITOT, PROT, ALBUMIN,  in the last 168 hours No results found for this basename: LIPASE, AMYLASE,  in the last 168 hours  Recent Labs Lab 01/01/13 0355  AMMONIA 29   CBC:  Recent Labs Lab 12/29/12 1421 01/01/13 0350  WBC 10.3 12.5*  NEUTROABS 7.4  --   HGB 9.3* 8.7*  HCT 29.8* 27.1*  MCV 82.3 80.2  PLT 358 332   Cardiac Enzymes:  Recent Labs Lab 12/29/12 1426 12/30/12 0012  TROPONINI <0.30 <0.30   BNP (last 3 results)  Recent Labs  12/29/12 1426 01/01/13 0350 01/02/13 0610  PROBNP 46080.0* 33652.0* 34244.0*   CBG: No results found for this basename: GLUCAP,  in the last 168 hours  Recent Results (from the past 240 hour(s))  CULTURE, BLOOD (ROUTINE X 2)     Status: None   Collection Time    12/29/12  3:57 PM      Result Value Range Status   Specimen Description BLOOD HAND RIGHT   Final  Special Requests BOTTLES DRAWN AEROBIC ONLY 2.5CC   Final   Culture  Setup Time 12/29/2012 23:13   Final   Culture     Final   Value:        BLOOD CULTURE RECEIVED NO GROWTH TO DATE CULTURE WILL BE HELD FOR 5 DAYS BEFORE ISSUING A FINAL NEGATIVE REPORT   Report Status PENDING   Incomplete  CULTURE, BLOOD (ROUTINE X 2)     Status: None   Collection Time    12/29/12  4:03 PM      Result Value Range Status   Specimen Description BLOOD ARM LEFT   Final   Special Requests BOTTLES DRAWN AEROBIC ONLY 10CC   Final   Culture  Setup Time 12/29/2012 23:13   Final   Culture     Final   Value:        BLOOD CULTURE RECEIVED NO GROWTH TO DATE CULTURE WILL BE HELD FOR 5 DAYS BEFORE ISSUING A FINAL NEGATIVE REPORT   Report Status PENDING   Incomplete     Studies: Mr Shirlee Latch Wo  Contrast  01/02/2013   *RADIOLOGY REPORT*  Clinical Data: Altered mental status, atrial fibrillation, hypertension, and hyperlipidemia.  Right occipital CVA.  MRA HEAD WITHOUT CONTRAST  Technique: Angiographic images of the Circle of Willis were obtained using MRA technique without intravenous contrast.  Comparison: 01/01/2013 MRI.  Findings: There is absent flow related enhancement in the upper cervical, petrous, cavernous internal carotid artery on the left. There is some reconstitution of the upper cavernous and supraclinoid internal carotid artery perhaps collateral flow from the left ophthalmic and/or retrograde flow via cross fill.  There is a small rounded structure in the expected region of the posterior communicating artery origin on the left.  This measures up to 3 mm and could represent a small berry aneurysm or infundibulum.  The right internal carotid artery shows wide patency in its upper cervical and petrous segments.  There is mild nonstenotic irregularity in the inferior RICA cavernous segment.  The supraclinoid ICA on the right is mildly irregular.  The basilar artery is patent with the right vertebral the dominant contributor.  The left vertebral does contribute to the basilar. There is smooth irregularity of the distal right vertebral without focal stenosis.  Focal irregularity of the distal left vertebral could represent a 50-75% stenosis (arrow).  Mid basilar stenosis between the superior cerebellar arteries and the anterior inferior cerebellar arteries approaches 50%.  The right anterior and middle cerebral arteries are widely patent proximally.  There is mild irregularity of the proximal left ACA and MCA. Diminished relative flow related enhancement left anterior circulation reflects carotid occlusion.  50% stenosis ambient segment right PCA.  Patent but disease bilateral superior cerebellar arteries.  Poorly visualized PICAs and AICAs. Moderate irregularity distal MCA branches bilaterally.   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  Intracranial atherosclerotic change as described.   Original Report Authenticated By: Davonna Belling, M.D.   Mr Laqueta Jean Wo Contrast  01/01/2013   *RADIOLOGY REPORT*  Clinical Data: Altered mental status.  History of high blood pressure and hyperlipidemia.  Thyroid cancer.  MRI HEAD WITHOUT AND WITH CONTRAST  Technique:  Multiplanar, multiecho pulse sequences of the brain and surrounding structures were obtained according to standard protocol without and with intravenous contrast  Contrast: 10mL MULTIHANCE GADOBENATE DIMEGLUMINE 529 MG/ML IV  SOLN  Comparison: 11/26/2003 head CT.  No comparison brain MR.  Findings: Motion degraded exam.   Tiny acute right occipital lobe infarct.  Remote infarct with blood stained encephalomalacia left caudate head and subsequent mild dilation of the adjacent left lateral ventricle.  Remote small thalamic infarcts larger on the left.  Moderate small vessel disease type changes.  Atrophy with ventricular prominence felt to be related to atrophy rather hydrocephalus.  Left internal carotid artery appears to be occluded or significantly narrowed with reconstitution of flow supraclinoid region.  Transverse ligament hypertrophy.  Cervical medullary junction unremarkable.  Partial empty sella incidentally noted.  No intracranial mass or abnormal enhancement noted on the significantly motion degraded images.  IMPRESSION: Motion degraded exam.  Tiny acute right occipital lobe infarct.  Remote infarct with blood stained encephalomalacia left caudate head and subsequent mild dilation of the adjacent left lateral ventricle.  Remote small thalamic infarcts larger on the left.  Moderate small vessel disease type changes.  Left internal carotid artery appears to be occluded or significantly narrowed with  reconstitution of flow supraclinoid region.  Critical Value/emergent results were called by telephone at the time of interpretation on 01/01/2013 at 2:30 p.m. to [facility] patient's nurse, who verbally acknowledged these results.   Original Report Authenticated By: Lacy Duverney, M.D.    Scheduled Meds: . albuterol  2.5 mg Nebulization QID  . amoxicillin-clavulanate  1 tablet Oral Q12H  . aspirin EC  81 mg Oral Daily  . budesonide  0.25 mg Nebulization QID  . calcitRIOL  0.5 mcg Oral Daily  . calcium carbonate  1 tablet Oral TID WC & HS  . clopidogrel  75 mg Oral Daily  . digoxin  0.0625 mg Oral Daily  . feeding supplement  1 Container Oral TID BM  . furosemide  40 mg Intravenous BID  . heparin  5,000 Units Subcutaneous Q8H  . hydrALAZINE  75 mg Oral TID  . isosorbide mononitrate  60 mg Oral Daily  . levothyroxine  125 mcg Oral QAC breakfast  . metoprolol succinate  25 mg Oral Daily  . pantoprazole  40 mg Oral QAC breakfast  . potassium chloride  10 mEq Oral Daily  . potassium chloride  40 mEq Oral Once  . predniSONE  40 mg Oral Q breakfast  . sodium chloride  3 mL Intravenous Q12H  . tiotropium  18 mcg Inhalation Daily   Continuous Infusions:   Principal Problem:   Acute and chronic respiratory failure Active Problems:   COPD (chronic obstructive pulmonary disease)   Ischemic cardiomyopathy, EF 30-35% by echo 03/29/12 - improved to 50-55% by 05/2012, but back to 35% 09/2012   Pericardial effusion, moderate on echo 03/29/12, continues to be moderate this admit   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   Protein-calorie malnutrition, severe   CVA (cerebral infarction)   Acute pulmonary edema    Time spent: 35    [facility]  [facility] Pager [phone]. If 7PM-7AM, please contact  night-coverage at [email or web address], password [facility] 01/03/2013, 9:23 AM  LOS: 5 days

## 2013-01-03 NOTE — Consult Note (Addendum)
PULMONARY  / CRITICAL CARE MEDICINE  Name: [name] [name] MRN: [id number] DOB: 11/28/1940    ADMISSION DATE:  12/29/2012 CONSULTATION DATE:  01/02/13  REFERRING MD :  Dr. Herbie Baltimore, Cardiology. PRIMARY SERVICE:  TRH  CHIEF COMPLAINT:  Morning somnolence.  BRIEF PATIENT DESCRIPTION: 72 year old male with history of COPD O2 dependent at 2L [place] at home who continues to smoke.  Has history of heart failure, CKD and PVD.  Noted to be more somnolent every AM by family and while in the hospital.  Cardiology asked we see patient for COPD and early morning somnolence.  Patient is arousable but lethargic and not providing much history.  SIGNIFICANT EVENTS / STUDIES:  7/14 pulmonary consult for COPD management and early morning somnolence.  LINES / TUBES: PIV  CULTURES: Blood 7/11>>>NTD  ANTIBIOTICS: Amoxacillin 7/11>>>   REVIEW OF SYSTEMS:   12 point ROS negative other than mentioned above.  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 Neuro:  Awake but lethargic, moves all ext to command. HEENT:  [place]/AT, PERRL, EOM-I and MMM. Neck:  Supple, +JVD and neck mass left of midline. Cardiovascular:  RRR, nl S1/S2, -M/R/G. Lungs:  Distant with bibasilar crackles. Abdomen:  Soft, NT, ND and +BS. Musculoskeletal:  2+ edema bilaterally. Skin:  Intact.   Recent Labs Lab 01/01/13 0350 01/02/13 0610 01/03/13 0719  NA 135 133* 139  K 3.6 3.4* 3.7  CL 91* 89* 92*  CO2 38* 36* 38*  BUN 36* 37* 34*  CREATININE 1.39* 1.41* 1.43*   GLUCOSE 100* 94 89    Recent Labs Lab 12/29/12 1421 01/01/13 0350  HGB 9.3* 8.7*  HCT 29.8* 27.1*  WBC 10.3 12.5*  PLT 358 332    Recent Labs Lab 12/29/12 1904 12/30/12 1040 01/03/13 0403  PHART 7.422 7.452* 7.460*  PCO2ART 62.4* 54.7* 53.4*  PO2ART 67.2* 63.1* 68.3*  HCO3 39.9* 37.6* 37.4*  TCO2 41.8 39.3 39.1  O2SAT 93.5 91.4 93.2    Mr Mra Head Wo Contrast  01/02/2013   *RADIOLOGY REPORT*  Clinical Data: Altered mental status, atrial fibrillation, hypertension, and hyperlipidemia.  Right occipital CVA.  MRA HEAD WITHOUT CONTRAST  Technique: Angiographic images of the Circle of Willis were obtained using MRA technique without intravenous contrast.  Comparison: 01/01/2013 MRI.  Findings: There is absent flow related enhancement in the upper cervical, petrous, cavernous internal carotid artery on the left. There is some reconstitution of the upper cavernous and supraclinoid internal carotid artery perhaps collateral flow from the left ophthalmic and/or retrograde flow via cross fill.  There is a small rounded structure in the expected region of the posterior communicating artery origin on the left.  This measures up to 3 mm and could represent a small berry aneurysm or infundibulum.  The right internal carotid artery shows wide patency in its upper cervical and petrous segments.  There is mild nonstenotic irregularity in the inferior RICA cavernous segment.  The supraclinoid ICA on the right is mildly irregular.  The basilar artery is patent with the right vertebral  the dominant contributor.  The left vertebral does contribute to the basilar. There is smooth irregularity of the distal right vertebral without focal stenosis.  Focal irregularity of the distal left vertebral could represent a 50-75% stenosis (arrow).  Mid basilar stenosis between the superior cerebellar arteries and the anterior inferior cerebellar arteries approaches 50%.  The right anterior and middle cerebral arteries  are widely patent proximally.  There is mild irregularity of the proximal left ACA and MCA. Diminished relative flow related enhancement left anterior circulation reflects carotid occlusion.  50% stenosis ambient segment right PCA.  Patent but disease bilateral superior cerebellar arteries.  Poorly visualized PICAs and AICAs. Moderate irregularity distal MCA branches bilaterally.  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  Intracranial atherosclerotic change as described.   Original Report Authenticated By: Davonna Belling, M.D.    ASSESSMENT / PLAN:  72 year old male with COPD history 2L [place], consult for early morning somnolence and COPD management.  Early morning somnolence could possibly due to early morning CO2 narcosis.  Neuro is following and has identified small acute occipital CVA but doubt that this explains encephalopathy.  SOB is likely multi-factorial with COPD and heart failure.  Patient is DNR. Plan: - Agree with current bronchodilator regimen - No need for treatment as acute exacerbation of COPD, no need for steroids and abx  > will d/c pred and augmentin 7/15 and follow - Continue diureses as tolerated per cards. - if CO2 drifts above 40 with diuresis then will treat w acetozolamide - CVA eval and rx as per Neuro recs   Levy Pupa, MD, PhD 01/03/2013, 2:48 PM [facility] [phone] or if no answer [phone]

## 2013-01-03 NOTE — Progress Notes (Signed)
NUTRITION FOLLOW UP  DOCUMENTATION CODES  Per approved criteria   -Severe malnutrition in the context of chronic illness    Intervention:   Continue Ensure Pudding po TID and Magic Cup supplements with meal trays. Recommend continuation of oral nutrition supplements after d/c.  RD to continue to follow nutrition care plan.  Nutrition Dx:   Inadequate oral intake related to poor appetite and increase needs as evidenced by weight loss. Improving.  Goal:   PO intake to meet >/=90% estimated nutrition needs. Improving.  Monitor:   PO intake, weight trends, labs, supplement acceptance  Assessment:   Pt with recent admission, now back with increased SOB with cough with sputum production.  Work-up reveals decompensated CHF.  Neuro consulted 2/2 AMS with abnormal MRI consistent with acute stroke. Pt with tiny R inferior occipital acute infarct.  BSE completed by SLP on 7/14 with recommendations for Dysphagia 3 diet with Nectar-Thickened liquids. Continues consume 50-75% of meals - confirmed with nurse tech.  ABG is consistent with profound metabolic alkalosis and moderately severe respiratory acidosis.  Pt meets criteria for severe malnutrition in the context of chronic illness 2/2 severe muscle wasting, weight loss of 15% body weight in the past 3 months, and meeting </=75% estimated nutrition needs for >/= 1 month.   Height: Ht Readings from Last 1 Encounters:  12/29/12 5\' 6"  (1.676 m)    Weight Status:   Wt Readings from Last 1 Encounters:  01/03/13 123 lb 1.6 oz (55.838 kg)  Admit wt 122 lb - wt stable.  Re-estimated needs:  Kcal: 1700 - 1900 Protein: 80 - 90 grams Fluid: 1.7 - 1.9 liters daily  Skin: intact  Diet Order: Dysphagia 3; Low Iodine Diet; Nectar Thickened Liquids   Intake/Output Summary (Last 24 hours) at 01/03/13 0916 Last data filed at 01/03/13 0612  Gross per 24 hour  Intake    483 ml  Output    900 ml  Net   -417 ml    Last BM:  7/11   Labs:   Recent Labs Lab 01/01/13 0350 01/02/13 0610 01/03/13 0719  NA 135 133* 139  K 3.6 3.4* 3.7  CL 91* 89* 92*  CO2 38* 36* 38*  BUN 36* 37* 34*  CREATININE 1.39* 1.41* 1.43*  CALCIUM 10.4 10.2 9.6  GLUCOSE 100* 94 89    CBG (last 3)  No results found for this basename: GLUCAP,  in the last 72 hours  Lab Results  Component Value Date   HGBA1C 5.6 01/02/2013   Lipid Panel     Component Value Date/Time   CHOL 170 01/02/2013 1053   TRIG 86 01/02/2013 1053   HDL 45 01/02/2013 1053   CHOLHDL 3.8 01/02/2013 1053   VLDL 17 01/02/2013 1053   LDLCALC 108* 01/02/2013 1053      Scheduled Meds: . albuterol  2.5 mg Nebulization QID  . amoxicillin-clavulanate  1 tablet Oral Q12H  . aspirin EC  81 mg Oral Daily  . budesonide  0.25 mg Nebulization QID  . calcitRIOL  0.5 mcg Oral Daily  . calcium carbonate  1 tablet Oral TID WC & HS  . clopidogrel  75 mg Oral Daily  . digoxin  0.0625 mg Oral Daily  . feeding supplement  1 Container Oral TID BM  . furosemide  40 mg Intravenous BID  . heparin  5,000 Units Subcutaneous Q8H  . hydrALAZINE  75 mg Oral TID  . isosorbide mononitrate  60 mg Oral Daily  . levothyroxine  125 mcg Oral QAC breakfast  . metoprolol succinate  25 mg Oral Daily  . pantoprazole  40 mg Oral QAC breakfast  . potassium chloride  10 mEq Oral Daily  . potassium chloride  40 mEq Oral Once  . predniSONE  40 mg Oral Q breakfast  . sodium chloride  3 mL Intravenous Q12H  . tiotropium  18 mcg Inhalation Daily    Continuous Infusions:   Jarold Motto MS, RD, LDN Pager: [phone] After-hours pager: [phone]

## 2013-01-03 NOTE — Progress Notes (Signed)
[facility]  DAILY PROGRESS NOTE   Subjective:  Very somnolent, but arousable. Oriented to self and location [facility]"), but not to time (season is "fall") and does not know my name (although he seems to recognize me). Lying completely flat in bed, no labored brathing, slow respirations. ABG is consistent with profound metabolic alkalosis and moderately severe respiratory acidosis (not much worse than his baseline).  Objective:  Temp:  [98 F (36.7 C)-98.8 F (37.1 C)] 98.4 F (36.9 C) (07/15 0609) Pulse Rate:  [69-86] 80 (07/15 0609) Resp:  [18-20] 18 (07/15 0609) BP: (110-149)/(53-63) 110/57 mmHg (07/15 0609) SpO2:  [94 %-98 %] 95 % (07/15 0609) Weight:  [123 lb 1.6 oz (55.838 kg)] 123 lb 1.6 oz (55.838 kg) (07/15 0609) Weight change: -11.2 oz (-0.317 kg)  Intake/Output from previous day: 07/14 0701 - 07/15 0700 In: 703 [P.O.:700; I.V.:3] Out: 900 [Urine:900]  Intake/Output from this shift:    Medications: Current Facility-Administered Medications  Medication Dose Route Frequency Provider Last Rate Last Dose  . 0.9 %  sodium chloride infusion  250 mL Intravenous PRN Catarina Hartshorn, MD      . acetaminophen (TYLENOL) tablet 650 mg  650 mg Oral Q4H PRN Catarina Hartshorn, MD      . albuterol (PROVENTIL) (5 MG/ML) 0.5% nebulizer solution 2.5 mg  2.5 mg Nebulization QID Kela Millin, MD   2.5 mg at 01/02/13 2031  . albuterol (PROVENTIL) (5 MG/ML) 0.5% nebulizer solution 2.5 mg  2.5 mg Nebulization Q4H PRN Adeline C Viyuoh, MD      . amoxicillin-clavulanate (AUGMENTIN) 875-125 MG per tablet 1 tablet  1 tablet Oral Q12H Kela Millin, MD   1 tablet at 01/02/13 2129  . aspirin EC tablet 81 mg  81 mg Oral Daily Catarina Hartshorn, MD   81 mg at 01/02/13 1050  . budesonide (PULMICORT) nebulizer solution 0.25 mg  0.25 mg Nebulization QID Kela Millin, MD   0.25 mg at 01/02/13 2031  . calcitRIOL (ROCALTROL) capsule 0.5 mcg  0.5 mcg Oral Daily Catarina Hartshorn, MD    0.5 mcg at 01/02/13 1052  . calcium carbonate (OS-CAL - dosed in mg of elemental calcium) tablet 500 mg of elemental calcium  1 tablet Oral TID WC & HS Catarina Hartshorn, MD   500 mg of elemental calcium at 01/03/13 0612  . clopidogrel (PLAVIX) tablet 75 mg  75 mg Oral Daily Catarina Hartshorn, MD   75 mg at 01/02/13 1050  . digoxin (LANOXIN) tablet 0.0625 mg  0.0625 mg Oral Daily Lennette Bihari, MD   0.0625 mg at 01/02/13 1053  . feeding supplement (ENSURE) pudding 1 Container  1 Container Oral TID BM Tonye Becket, RD   1 Container at 01/02/13 2000  . furosemide (LASIX) injection 40 mg  40 mg Intravenous BID Kela Millin, MD   40 mg at 01/02/13 1739  . heparin injection 5,000 Units  5,000 Units Subcutaneous Q8H Catarina Hartshorn, MD   5,000 Units at 01/03/13 0612  . hydrALAZINE (APRESOLINE) tablet 75 mg  75 mg Oral TID Nada Boozer, NP   75 mg at 01/02/13 2129  . isosorbide mononitrate (IMDUR) 24 hr tablet 60 mg  60 mg Oral Daily Lennette Bihari, MD   60 mg at 01/02/13 1052  . levothyroxine (SYNTHROID, LEVOTHROID) tablet 125 mcg  125 mcg Oral QAC breakfast Catarina Hartshorn, MD   125 mcg at 01/03/13 0612  . metoprolol succinate (TOPROL-XL) 24 hr tablet  25 mg  25 mg Oral Daily Catarina Hartshorn, MD   25 mg at 01/02/13 1050  . nitroGLYCERIN (NITROSTAT) SL tablet 0.4 mg  0.4 mg Sublingual Q5 Min x 3 PRN Catarina Hartshorn, MD      . ondansetron [facility]) injection 4 mg  4 mg Intravenous Q6H PRN Catarina Hartshorn, MD      . oxyCODONE-acetaminophen (PERCOCET/ROXICET) 5-325 MG per tablet 1 tablet  1 tablet Oral Q4H PRN Catarina Hartshorn, MD   1 tablet at 01/02/13 2147  . pantoprazole (PROTONIX) EC tablet 40 mg  40 mg Oral QAC breakfast Kela Millin, MD   40 mg at 01/03/13 0612  . potassium chloride (K-DUR,KLOR-CON) CR tablet 10 mEq  10 mEq Oral Daily Catarina Hartshorn, MD   10 mEq at 01/02/13 1050  . potassium chloride SA (K-DUR,KLOR-CON) CR tablet 40 mEq  40 mEq Oral Once Enid Skeens, MD      . predniSONE (DELTASONE) tablet 40 mg  40 mg Oral Q breakfast  Kela Millin, MD   40 mg at 01/03/13 0612  . RESOURCE THICKENUP CLEAR   Oral PRN Catarina Hartshorn, MD      . sodium chloride 0.9 % injection 3 mL  3 mL Intravenous Q12H Catarina Hartshorn, MD   3 mL at 01/02/13 2141  . sodium chloride 0.9 % injection 3 mL  3 mL Intravenous PRN Catarina Hartshorn, MD      . tiotropium [facility]) inhalation capsule 18 mcg  18 mcg Inhalation Daily Catarina Hartshorn, MD   18 mcg at 01/02/13 1914    Physical Exam: General appearance: cachectic, no distress and slowed mentation Neck: JVD - 6 cm above sternal notch, no adenopathy, no carotid bruit, supple, symmetrical, trachea midline and thyroid not enlarged, symmetric, no tenderness/mass/nodules Lungs: clear to auscultation bilaterally Heart: regular rate and rhythm, S1, S2 normal, no murmur, click, rub or gallop Abdomen: soft, non-tender; bowel sounds normal; no masses,  no organomegaly Extremities: extremities normal, atraumatic, no cyanosis or edema Pulses: 2+ and symmetric Skin: Skin color, texture, turgor normal. No rashes or lesions Neurologic: Mental status: alertness: lethargic, orientation: place, no gross focal deficits  Lab Results: Results for orders placed during the hospital encounter of 12/29/12 (from the past 48 hour(s))  BASIC METABOLIC PANEL     Status: Abnormal   Collection Time    01/02/13  6:10 AM      Result Value Range   Sodium 133 (*) 135 - 145 mEq/L   Potassium 3.4 (*) 3.5 - 5.1 mEq/L   Chloride 89 (*) 96 - 112 mEq/L   CO2 36 (*) 19 - 32 mEq/L   Glucose, Bld 94  70 - 99 mg/dL   BUN 37 (*) 6 - 23 mg/dL   Creatinine, Ser 7.82 (*) 0.50 - 1.35 mg/dL   Calcium 95.6  8.4 - 21.3 mg/dL   GFR calc non Af Amer 49 (*) >90 mL/min   GFR calc Af Amer 56 (*) >90 mL/min   Comment:            The eGFR has been calculated     using the CKD EPI equation.     This calculation has not been     validated in all clinical     situations.     eGFR's persistently     <90 mL/min signify     possible Chronic Kidney Disease.   PRO B NATRIURETIC PEPTIDE     Status: Abnormal   Collection Time  01/02/13  6:10 AM      Result Value Range   Pro B Natriuretic peptide (BNP) 34244.0 (*) 0 - 125 pg/mL  HEMOGLOBIN A1C     Status: None   Collection Time    01/02/13 10:53 AM      Result Value Range   Hemoglobin A1C 5.6  <5.7 %   Comment: (NOTE)                                                                               According to the ADA Clinical Practice Recommendations for 2011, when     HbA1c is used as a screening test:      >=6.5%   Diagnostic of Diabetes Mellitus               (if abnormal result is confirmed)     5.7-6.4%   Increased risk of developing Diabetes Mellitus     References:Diagnosis and Classification of Diabetes Mellitus,Diabetes     Care,2011,34(Suppl 1):S62-S69 and Standards of Medical Care in             Diabetes - 2011,Diabetes Care,2011,34 (Suppl 1):S11-S61.   Mean Plasma Glucose 114  <117 mg/dL  LIPID PANEL     Status: Abnormal   Collection Time    01/02/13 10:53 AM      Result Value Range   Cholesterol 170  0 - 200 mg/dL   Triglycerides 86  <782 mg/dL   HDL 45  >95 mg/dL   Total CHOL/HDL Ratio 3.8     VLDL 17  0 - 40 mg/dL   LDL Cholesterol 621 (*) 0 - 99 mg/dL   Comment:            Total Cholesterol/HDL:CHD Risk     Coronary Heart Disease Risk Table                         Men   Women      1/2 Average Risk   3.4   3.3      Average Risk       5.0   4.4      2 X Average Risk   9.6   7.1      3 X Average Risk  23.4   11.0                Use the calculated Patient Ratio     above and the CHD Risk Table     to determine the patient's CHD Risk.                ATP III CLASSIFICATION (LDL):      <100     mg/dL   Optimal      308-657  mg/dL   Near or Above                        Optimal      130-159  mg/dL   Borderline      846-962  mg/dL   High      >952     mg/dL   Very High  BLOOD GAS, ARTERIAL  Status: Abnormal   Collection Time    01/03/13  4:03 AM      Result  Value Range   O2 Content 3.0     Delivery systems NASAL CANNULA     pH, Arterial 7.460 (*) 7.350 - 7.450   pCO2 arterial 53.4 (*) 35.0 - 45.0 mmHg   pO2, Arterial 68.3 (*) 80.0 - 100.0 mmHg   Bicarbonate 37.4 (*) 20.0 - 24.0 mEq/L   TCO2 39.1  0 - 100 mmol/L   Acid-Base Excess 12.8 (*) 0.0 - 2.0 mmol/L   O2 Saturation 93.2     Patient temperature 98.6     Collection site LEFT RADIAL     Drawn by [id number]     Sample type ARTERIAL DRAW     Allens test (pass/fail) PASS  PASS    Imaging: Mr Shirlee Latch Wo Contrast  01/02/2013   *RADIOLOGY REPORT*  Clinical Data: Altered mental status, atrial fibrillation, hypertension, and hyperlipidemia.  Right occipital CVA.  MRA HEAD WITHOUT CONTRAST  Technique: Angiographic images of the Circle of Willis were obtained using MRA technique without intravenous contrast.  Comparison: 01/01/2013 MRI.  Findings: There is absent flow related enhancement in the upper cervical, petrous, cavernous internal carotid artery on the left. There is some reconstitution of the upper cavernous and supraclinoid internal carotid artery perhaps collateral flow from the left ophthalmic and/or retrograde flow via cross fill.  There is a small rounded structure in the expected region of the posterior communicating artery origin on the left.  This measures up to 3 mm and could represent a small berry aneurysm or infundibulum.  The right internal carotid artery shows wide patency in its upper cervical and petrous segments.  There is mild nonstenotic irregularity in the inferior RICA cavernous segment.  The supraclinoid ICA on the right is mildly irregular.  The basilar artery is patent with the right vertebral the dominant contributor.  The left vertebral does contribute to the basilar. There is smooth irregularity of the distal right vertebral without focal stenosis.  Focal irregularity of the distal left vertebral could represent a 50-75% stenosis (arrow).  Mid basilar stenosis between the  superior cerebellar arteries and the anterior inferior cerebellar arteries approaches 50%.  The right anterior and middle cerebral arteries are widely patent proximally.  There is mild irregularity of the proximal left ACA and MCA. Diminished relative flow related enhancement left anterior circulation reflects carotid occlusion.  50% stenosis ambient segment right PCA.  Patent but disease bilateral superior cerebellar arteries.  Poorly visualized PICAs and AICAs. Moderate irregularity distal MCA branches bilaterally.  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  Intracranial atherosclerotic change as described.   Original Report Authenticated By: Davonna Belling, M.D.   Mr Laqueta Jean Wo Contrast  01/01/2013   *RADIOLOGY REPORT*  Clinical Data: Altered mental status.  History of high blood pressure and hyperlipidemia.  Thyroid cancer.  MRI HEAD WITHOUT AND WITH CONTRAST  Technique:  Multiplanar, multiecho pulse sequences of the brain and surrounding structures were obtained according to standard protocol without and with intravenous contrast  Contrast: 10mL MULTIHANCE GADOBENATE DIMEGLUMINE 529 MG/ML IV SOLN  Comparison: 11/26/2003 head CT.  No comparison brain MR.  Findings: Motion degraded exam.   Tiny acute right occipital lobe infarct.  Remote infarct with blood stained encephalomalacia left caudate head and subsequent mild dilation of the adjacent  left lateral ventricle.  Remote small thalamic infarcts larger on the left.  Moderate small vessel disease type changes.  Atrophy with ventricular prominence felt to be related to atrophy rather hydrocephalus.  Left internal carotid artery appears to be occluded or significantly narrowed with reconstitution of flow supraclinoid region.  Transverse ligament hypertrophy.  Cervical medullary junction unremarkable.  Partial empty sella  incidentally noted.  No intracranial mass or abnormal enhancement noted on the significantly motion degraded images.  IMPRESSION: Motion degraded exam.  Tiny acute right occipital lobe infarct.  Remote infarct with blood stained encephalomalacia left caudate head and subsequent mild dilation of the adjacent left lateral ventricle.  Remote small thalamic infarcts larger on the left.  Moderate small vessel disease type changes.  Left internal carotid artery appears to be occluded or significantly narrowed with reconstitution of flow supraclinoid region.  Critical Value/emergent results were called by telephone at the time of interpretation on 01/01/2013 at 2:30 p.m. to [facility] patient's nurse, who verbally acknowledged these results.   Original Report Authenticated By: Lacy Duverney, M.D.    Assessment:  1. Principal Problem: 2.   Acute and chronic respiratory failure 3. Active Problems: 4.   COPD (chronic obstructive pulmonary disease) 5.   Ischemic cardiomyopathy, EF 30-35% by echo 03/29/12 - improved to 50-55% by 05/2012, but back to 35% 09/2012 6.   Pericardial effusion, moderate on echo 03/29/12, continues to be moderate this admit 7.   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   CAD, unsuccessful RCA PCI Sept 2013 9.   Chronic renal disease, stage III 10.   SOB (shortness of breath) 11.   SVT (supraventricular tachycardia), poss PAF short bursts. 12.   Protein-calorie malnutrition, severe 13.   CVA (cerebral infarction) 14.   Acute pulmonary edema 15.   Plan:  1. Acute occipital CVA - no clear documentation of sustained AF that I am aware of, but this is definitely a concern in this patient with significant ischemic CMP. On the other hand he has extensive ASCVD, including in the posterior circulation and I would consider this a more likely cause of his stroke, even if no new vascular lesions are found. 2. I agree that the CVA is not large enough to explain changes in mentation.  Most likely metabolic cause of mental changes. 3. Consider acetazolamide (defer to Dr. Delton Coombes), but need to be very wary of possible hyponatremia and hypokalemia. 4. Clinically still hypervolemic, but without any respiratory dificulty, can back off loop diuretics if Na/K/CO2 worsen.  Time Spent Directly with Patient:  35 minutes  Length of Stay:  LOS: 5 days    Karlisa Gaubert 01/03/2013, 8:39 AM

## 2013-01-04 DIAGNOSIS — I472 Ventricular tachycardia: Secondary | ICD-10-CM

## 2013-01-04 DIAGNOSIS — Z8673 Personal history of transient ischemic attack (TIA), and cerebral infarction without residual deficits: Secondary | ICD-10-CM

## 2013-01-04 LAB — CULTURE, BLOOD (ROUTINE X 2): Culture: NO GROWTH

## 2013-01-04 MED ORDER — APIXABAN 2.5 MG PO TABS
2.5000 mg | ORAL_TABLET | Freq: Two times a day (BID) | ORAL | Status: DC
  Administered 2013-01-04 – 2013-01-07 (×6): 2.5 mg via ORAL
  Filled 2013-01-04 (×7): qty 1

## 2013-01-04 MED ORDER — SIMVASTATIN 20 MG PO TABS
20.0000 mg | ORAL_TABLET | Freq: Every day | ORAL | Status: DC
  Administered 2013-01-04 – 2013-01-06 (×2): 20 mg via ORAL
  Filled 2013-01-04 (×4): qty 1

## 2013-01-04 NOTE — Progress Notes (Signed)
[facility]  Subjective: More alert today. His breathing has improved. Still has a productive cough with yellow colored sputum.   Objective: Vital signs in last 24 hours: Temp:  [98 F (36.7 C)-98.2 F (36.8 C)] 98.2 F (36.8 C) (07/16 0650) Pulse Rate:  [70-92] 72 (07/16 0650) Resp:  [16-20] 18 (07/16 0650) BP: (133-154)/(58-76) 133/62 mmHg (07/16 0650) SpO2:  [96 %-100 %] 96 % (07/16 0803) Weight:  [124 lb 9 oz (56.5 kg)] 124 lb 9 oz (56.5 kg) (07/16 0650) Last BM Date: 01/01/13  Intake/Output from previous day: 07/15 0701 - 07/16 0700 In: 1360 [P.O.:1360] Out: 1125 [Urine:1125] Intake/Output this shift:    Medications Current Facility-Administered Medications  Medication Dose Route Frequency Provider Last Rate Last Dose  . 0.9 %  sodium chloride infusion  250 mL Intravenous PRN Catarina Hartshorn, MD      . acetaminophen (TYLENOL) tablet 650 mg  650 mg Oral Q4H PRN Catarina Hartshorn, MD      . albuterol (PROVENTIL) (5 MG/ML) 0.5% nebulizer solution 2.5 mg  2.5 mg Nebulization QID Kela Millin, MD   2.5 mg at 01/04/13 0802  . albuterol (PROVENTIL) (5 MG/ML) 0.5% nebulizer solution 2.5 mg  2.5 mg Nebulization Q4H PRN Adeline C Viyuoh, MD      . aspirin EC tablet 81 mg  81 mg Oral Daily Catarina Hartshorn, MD   81 mg at 01/03/13 1125  . bisacodyl (DULCOLAX) EC tablet 10 mg  10 mg Oral Daily PRN Adeline C Viyuoh, MD      . budesonide (PULMICORT) nebulizer solution 0.25 mg  0.25 mg Nebulization QID Kela Millin, MD   0.25 mg at 01/04/13 0802  . calcitRIOL (ROCALTROL) capsule 0.5 mcg  0.5 mcg Oral Daily Catarina Hartshorn, MD   0.5 mcg at 01/03/13 1130  . calcium carbonate (OS-CAL - dosed in mg of elemental calcium) tablet 500 mg of elemental calcium  1 tablet Oral TID WC & HS Catarina Hartshorn, MD   500 mg of elemental calcium at 01/04/13 0648  . clopidogrel (PLAVIX) tablet 75 mg  75 mg Oral Daily Catarina Hartshorn, MD   75 mg at 01/03/13 1126  . digoxin (LANOXIN) tablet 0.0625 mg  0.0625 mg  Oral Daily Lennette Bihari, MD   0.0625 mg at 01/03/13 1129  . feeding supplement (ENSURE) pudding 1 Container  1 Container Oral TID BM Tonye Becket, RD   1 Container at 01/03/13 1948  . furosemide (LASIX) injection 40 mg  40 mg Intravenous BID Kela Millin, MD   40 mg at 01/03/13 1703  . heparin injection 5,000 Units  5,000 Units Subcutaneous Q8H Catarina Hartshorn, MD   5,000 Units at 01/04/13 0525  . hydrALAZINE (APRESOLINE) tablet 75 mg  75 mg Oral TID Nada Boozer, NP   75 mg at 01/03/13 2320  . isosorbide mononitrate (IMDUR) 24 hr tablet 60 mg  60 mg Oral Daily Lennette Bihari, MD   60 mg at 01/03/13 1128  . levothyroxine (SYNTHROID, LEVOTHROID) tablet 125 mcg  125 mcg Oral QAC breakfast Catarina Hartshorn, MD   125 mcg at 01/04/13 0526  . metoprolol succinate (TOPROL-XL) 24 hr tablet 25 mg  25 mg Oral Daily Onalee Hua Tat, MD   25 mg at 01/03/13 1126  . nitroGLYCERIN (NITROSTAT) SL tablet 0.4 mg  0.4 mg Sublingual Q5 Min x 3 PRN Catarina Hartshorn, MD      . ondansetron [facility] injection 4 mg  4 mg Intravenous  Q6H PRN Catarina Hartshorn, MD      . oxyCODONE-acetaminophen (PERCOCET/ROXICET) 5-325 MG per tablet 1 tablet  1 tablet Oral Q4H PRN Catarina Hartshorn, MD   1 tablet at 01/02/13 2147  . pantoprazole (PROTONIX) EC tablet 40 mg  40 mg Oral QAC breakfast Kela Millin, MD   40 mg at 01/04/13 0525  . potassium chloride (K-DUR,KLOR-CON) CR tablet 10 mEq  10 mEq Oral Daily Catarina Hartshorn, MD   10 mEq at 01/03/13 1126  . potassium chloride SA (K-DUR,KLOR-CON) CR tablet 40 mEq  40 mEq Oral Once Enid Skeens, MD      . RESOURCE THICKENUP CLEAR   Oral PRN Catarina Hartshorn, MD      . senna [facility]) tablet 8.6 mg  1 tablet Oral QHS Kela Millin, MD   8.6 mg at 01/03/13 2208  . sodium chloride 0.9 % injection 3 mL  3 mL Intravenous Q12H Catarina Hartshorn, MD   3 mL at 01/03/13 2207  . sodium chloride 0.9 % injection 3 mL  3 mL Intravenous PRN Catarina Hartshorn, MD      . tiotropium [facility]) inhalation capsule 18 mcg  18 mcg Inhalation Daily Catarina Hartshorn, MD    18 mcg at 01/04/13 0801    PE: General appearance: alert, cooperative and no distress Lungs: rhonchi bilaterally Heart: regular rate and rhythm Extremities: no LEE Pulses: 2+ and symmetric Skin: warm and dry Neurologic: Grossly normal  Lab Results:  No results found for this basename: WBC, HGB, HCT, PLT,  in the last 72 hours BMET  Recent Labs  01/02/13 0610 01/03/13 0719  NA 133* 139  K 3.4* 3.7  CL 89* 92*  CO2 36* 38*  GLUCOSE 94 89  BUN 37* 34*  CREATININE 1.41* 1.43*  CALCIUM 10.2 9.6   PT/INR No results found for this basename: LABPROT, INR,  in the last 72 hours Cholesterol  Recent Labs  01/02/13 1053  CHOL 170   BNP (last 3 results)  Recent Labs  12/29/12 1426 01/01/13 0350 01/02/13 0610  PROBNP 46080.0* 33652.0* 34244.0*    Assessment/Plan  Principal Problem:   Acute and chronic respiratory failure Active Problems:   COPD (chronic obstructive pulmonary disease)   Ischemic cardiomyopathy, EF 30-35% by echo 03/29/12 - improved to 50-55% by 05/2012, but back to 35% 09/2012   Pericardial effusion, moderate on echo 03/29/12, continues to be moderate this admit   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   Protein-calorie malnutrition, severe   CVA (cerebral infarction)   Acute pulmonary edema  Plan: Pt appears more alert this morning. He states that his breathing is improved. He continues on 40 mg IV Lasix BID, yet poor diuresis based on I/Os. ? If strict I/Os are being recorded. Will recheck BNP to see if there is any improvement. Will also check BMP to assess renal function and electrolytes.  Pulmonology and Neurology both are following. Pt went into PAF with controlled ventricular response this am. A-fib lasted ~ 30 minutes. ? If PAF was cause of recent CVA. Neurology is  recommending switch from ASA and Plavix to Eliquis. Based on dosing recommendations, the patient only meets 1 of the 3 criteria for lower daily dose of 2.5 mg. His weight is less than 60 kg. He is less than 80 yrs and  SCr is less than 1.5. Therefore, he meets criteria for full dose of 5 mg daily. However, his SCr is 1.43, which is close to cut off point. Will leave decision to cardiologist. See Dr. Marlis Edelson note from 7/15 with details.     LOS: 6 days    Brittainy M. Delmer Islam 01/04/2013 8:47 AM   I have seen and evaluated the patient this PM along with Boyce Medici, PA. I agree with her findings, examination as well as impression recommendations.  Looks better today than over the past few days -- did have documented AFib last PM -- per Neurology c/s recommendations, we can use Eliquis for [facility] & d/c ASA/Plavix.  May want to re-initiate ASA though once no bleed on Eliquis (with weight & Cr ~1.5 ish, will use lower dose) is confirmed, due to CAD.  Otherwise renal function is stable - BNP still elevated, may consider PO diuretics tomorrow.   MD Time with pt: 10 min  HARDING,DAVID W, M.D., M.S. [facility] [address] [place], [place]  [address]  [phone] Pager # [phone] 01/04/2013 6:42 PM

## 2013-01-04 NOTE — Progress Notes (Signed)
Occupational Therapy Evaluation Patient Details Name: [name] [name] MRN: [id number] DOB: 1941/03/08 Today's Date: 01/04/2013 Time: 5621-3086 OT Time Calculation (min): 25 min  OT Assessment / Plan / Recommendation History of present illness  72 y.o. male with a history of hypertension, hyperlipidemia, peripheral vascular disease, COPD,congestive heart failure, coronary artery disease and previous stroke, being evaluated by neurology because of abnormal MRI study with small right inferior occipital acute stroke. 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     Clinical Impression   Pt presents with apparent L superior field deficit. Pt with decreased insight into safety concerns regarding recent problems with vision (over last 5-6 weeks). Recommend that pt follow up with neuro opthamologist for a full peripheral field test/vision exam. Discussed POC with sister and wrote D/C information regarding need to follow up with eye doctor. Family asked to call and make an appt with  The neuro outpt center if the eye doctor feels his visual deficit is related to his CVA. Recommended intermittent S after D/C and pt to refrain from driving until cleared by eye doctor    OT Assessment  All further OT needs can be met in the next venue of care    Follow Up Recommendations  Other (comment) (neuro opthamologist)    Barriers to Discharge      Equipment Recommendations  None recommended by OT    Recommendations for Other Services    Frequency       Precautions / Restrictions Precautions Precautions: Fall Restrictions Weight Bearing Restrictions: No   Pertinent Vitals/Pain no apparent distress     ADL  ADL Comments: overall mod I with ADL and S with tub transfers    OT Diagnosis: Disturbance of vision  OT Problem List: Impaired vision/perception OT Treatment Interventions:     OT Goals(Current goals can be found in the care plan  section) Acute Rehab OT Goals Patient Stated Goal: to go home OT Goal Formulation:  (eval only)  Visit Information  Last OT Received On: 01/04/13 Assistance Needed: +1 History of Present Illness: 7       Prior Functioning     Home Living Family/patient expects to be discharged to:: Private residence Living Arrangements: Other relatives Available Help at Discharge: Family Type of Home: House Home Access: Stairs to enter Secretary/administrator of Steps: 1 Entrance Stairs-Rails: None Home Layout: One level Home Equipment: Environmental consultant - 2 wheels;Cane - single point Prior Function Level of Independence: Independent Comments: states he wears O2 at night Communication Communication: No difficulties Dominant Hand: Right         Vision/Perception Vision - History Baseline Vision: Wears glasses all the time Patient Visual Report: Blurring of vision Vision - Assessment Eye Alignment: Within Functional Limits Vision Assessment: Vision tested Ocular Range of Motion: Impaired-to be further tested in functional context Alignment/Gaze Preference: Within Defined Limits Tracking/Visual Pursuits: Decreased smoothness of vertical tracking Saccades: Additional head turns occurred during testing Convergence: Within functional limits Visual Fields: Left visual field deficit;Impaired - to be further tested in functional context (L superior filed) Praxis Praxis: Intact   Cognition  Cognition Arousal/Alertness: Awake/alert Behavior During Therapy: WFL for tasks assessed/performed Overall Cognitive Status: History of cognitive impairments - at baseline    Extremity/Trunk Assessment Upper Extremity Assessment Upper Extremity Assessment: Overall WFL for tasks assessed Lower Extremity Assessment Lower Extremity Assessment: Overall WFL for tasks assessed     Mobility Bed Mobility Bed Mobility: Not assessed Transfers Sit to Stand: 6: Modified  independent (Device/Increase time);From  toilet Stand to Sit: 6: Modified independent (Device/Increase time);To chair/3-in-1     Exercise     Balance  WFL   End of Session OT - End of Session Activity Tolerance: Patient tolerated treatment well Patient left: in chair;with call bell/phone within reach;with family/visitor present;with nursing/sitter in room Nurse Communication: Mobility status  GO     [name],HILLARY 01/04/2013, 3:29 PM [facility], OTR/L  [phone] 01/04/2013

## 2013-01-04 NOTE — Discharge Summary (Addendum)
Physician Discharge Summary  [name] [name] ZOX:[id number] DOB: 08-04-1940 DOA: 12/29/2012  PCP: [name], MD  Admit date: 12/29/2012 Discharge date: 01/07/2013  Time spent: 30 minutes  Recommendations for Outpatient Follow-up:  1. CVA; per neurology continue  Eliquis 2.5 mg twice a day secondary to patient's creatinine being just under the cutoff of 1.5. Patient counseled not to drive until he obtains an outpatient clearance from an ophthalmologist 2. CHF; patient had baseline, continue home medications. Diuresis complete patient tolerated oral medication ready for discharge. 3. COPD; consultation that continue to use O2 while sleeping however he now meets the guidelines for 24 hour a day use of O2. On ambulation SpO2 <88% (85% walking 140 ft). Followup with Dr. Levy Pupa (pulmonologist) 4. 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   Protein-calorie malnutrition, severe   CVA (cerebral infarction)   Acute pulmonary edema   Discharge Condition: Stable  Diet recommendation: Heart healthy  Filed Weights   01/03/13 0500 01/03/13 0609 01/04/13 0650  Weight: 55.838 kg (123 lb 1.6 oz) 55.838 kg (123 lb 1.6 oz) 56.5 kg (124 lb 9 oz)    History of present illness:  72  Yo  WM PMHx  COPD O2 dependent at 2L [place] at home who continues to smoke,  HTN, CHF,  cardiac murmur, A-Fib, CAD, papillary thyroid carcinoma, CKD stage III,  PVD, . Noted to be more somnolent every AM by family and while in the hospital. Cardiology asked we see patient for COPD and early morning somnolence. TODAY patient is up alert and oriented interacting with medical staff appropriately. States only uses oxygen at night. PT has just completed walking patient 142 feet on room air in patients SpO2 dropped to 85% at which point exercise test was terminated and patient was placed back on oxygen. Patient states Dr. Levy Pupa is his pulmonologist we will have him followup with upon discharge. Patient currently back to baseline       Procedures: MRI 01/01/2013; 1.Tiny acute right occipital lobe infarct.  2.Remote infarct with blood stained encephalomalacia left caudate  head and subsequent mild dilation of the adjacent left lateral  ventricle.  3. Remote small thalamic infarcts larger on the left.  4. Moderate small vessel disease type changes.  MRI 01/02/2013 1.Based on the MRI and MRA appearance, the left upper cervical and  skull base internal carotid artery appears occluded.  2.Possible 2-3 mm left PCom aneurysm or infundibulum.  3. Intracranial atherosclerotic change as described.      Consultations: Neurology, Cardiology, Pulmonology   Discharge Exam: Filed Vitals:   01/03/13 1932 01/03/13 2119 01/04/13 0650 01/04/13 0803  BP:  136/76 133/62   Pulse: 92 70 72   Temp:  98 F (36.7 C) 98.2 F (36.8 C)   TempSrc:  Oral Oral   Resp: 16 18 18    Height:  Weight:   56.5 kg (124 lb 9 oz)   SpO2:  100% 98% 96%    General: Alert, on 2 L of O2 via nasal cannula,NAD, cachectic (patient unsure, to weight he has lost over past year) Cardiovascular: Irregular irregular rhythm and rate, positive grade 2 systolic murmur (per patient has had all his life), DP/PT pulse one plus bilateral Respiratory: Improve air movement in all lung fields, continued mild expiratory  wheezing   Discharge Instructions   Future Appointments Provider Department Dept Phone   01/12/2013 1:00 PM [facility] In[facility] 1 [facility] [phone]   NPO after midnight   01/13/2013 1:00 PM [facility] [phone]   01/20/2013 9:00 AM [facility] 1 [facility] [phone]   03/20/2013 2:00 PM Runell Gess, MD [facility] [place] [phone]   05/03/2013 11:00 AM [facility] [phone]   05/03/2013 11:40 AM Evern Bio, NP [facility] [phone]       Medication List    ASK your doctor about these medications       albuterol (5 MG/ML) 0.5% nebulizer solution  Commonly known as:  PROVENTIL  Take 0.5 mLs (2.5 mg total) by nebulization every 3 (three) hours as needed for wheezing or shortness of breath.     aspirin EC 81 MG tablet  Take 81 mg by mouth daily.     budesonide 0.25 MG/2ML nebulizer solution  Commonly known as:  PULMICORT  Take 2 mLs (0.25 mg total) by nebulization every 6 (six) hours.     calcitRIOL 0.5 MCG capsule  Commonly known as:  ROCALTROL  Take 0.5 mcg by mouth daily.     calcium carbonate 1250 MG tablet  Commonly known as:  OS-CAL - dosed in mg of elemental calcium  Take 1 tablet (500 mg of elemental calcium total) by mouth 4 (four) times daily.     clopidogrel 75 MG tablet  Commonly known as:  PLAVIX  Take 75 mg by mouth daily.     furosemide 40 MG tablet  Commonly known as:  LASIX  Take 1 tablet (40 mg total) by mouth 2 (two) times daily.     gabapentin 600 MG tablet  Commonly known as:  NEURONTIN  Take 600 mg by mouth 2 (two) times daily.     hydrALAZINE 25 MG tablet  Commonly known as:  APRESOLINE  Take 3 tablets (75 mg total) by mouth 2 (two) times daily.     isosorbide mononitrate 30 MG 24 hr tablet  Commonly known as:   IMDUR  Take 1 tablet (30 mg total) by mouth daily.     levothyroxine 125 MCG tablet  Commonly known as:  SYNTHROID, LEVOTHROID  Take 125 mcg by mouth daily before breakfast.     metoprolol succinate 25 MG 24 hr tablet  Commonly known as:  TOPROL-XL  Take 25 mg by mouth daily.     nitroGLYCERIN 0.4 MG SL tablet  Commonly known as:  NITROSTAT  Place 1 tablet (0.4 mg total) under the tongue every 5 (five) minutes x 3 doses as needed for chest pain.     oxyCODONE-acetaminophen 5-325 MG per tablet  Commonly known as:  PERCOCET/ROXICET  Take 1-2 tablets by mouth every 4 (four) hours as needed for pain.     pantoprazole 40 MG tablet  Commonly known as:  PROTONIX  Take 1 tablet (40 mg total) by  mouth daily at 12 noon.     potassium chloride 10 MEQ tablet  Commonly known as:  K-DUR,KLOR-CON  Take 10 mEq by mouth daily.     RESOURCE THICKENUP CLEAR Powd  As directed on the packaging.     tiotropium 18 MCG inhalation capsule  Commonly known as:  SPIRIVA  Place 1 capsule (18 mcg total) into inhaler and inhale daily.       Allergies  Allergen Reactions  . Ceclor (Cefaclor) Anaphylaxis    Recently took pcn for tooth problems, without issues  . Morphine And Related Other (See Comments)    Abnormal behavior       Follow-up Information   Follow up with [facility]. (Home health nurse)    Contact information:   [phone]       The results of significant diagnostics from this hospitalization (including imaging, microbiology, ancillary and laboratory) are listed below for reference.    Significant Diagnostic Studies: Dg Chest 2 View  12/31/2012   *RADIOLOGY REPORT*  Clinical Data: Shortness of breath and findings suggestive of left lower lobe infiltrate by prior chest x-ray.  CHEST - 2 VIEW  Comparison: 12/29/2012  Findings: There is suggestion of left lower lobe infiltrate with associated small left pleural effusion.  Underlying significant COPD/chronic lung disease  again noted.  The heart size is within normal limits.  Stable osteopenia of the thoracic spine with several compression fractures identified.  IMPRESSION: Persistent suggestion of left lower lobe infiltrate with associated small left pleural effusion.   Original Report Authenticated By: Irish Lack, M.D.   Dg Chest 2 View  12/29/2012   *RADIOLOGY REPORT*  Clinical Data: Cough, congestion, wheezing.  Thyroid cancer.  CHEST - 2 VIEW  Comparison: 12/07/2012.  Findings: Chronic cardiopericardial enlargement.  The mediastinal contours are distorted by rightward rotation.  There is extensive aortic calcification.  Abnormal left lower lobe aeration, with elevation of the diaphragm. Morphology of the lower lung may represent subpulmonic pleural fluid.  Chronic coarsening of interstitial markings, likely chronic lung disease rather than edema.  Small pulmonary nodules noted on previous PET-CT not clearly seen today. Right third, fourth, fifth, seventh, and eighth rib fractures, present on previous PET imaging.  IMPRESSION: 1.  Abnormal aeration at the left base, suspicious for pneumonia with small pleural effusion. 2. Numerous nonacute, but potentially unhealed upper right rib fractures.   Original Report Authenticated By: Tiburcio Pea   Mr [facility] Wo Contrast  01/02/2013   *RADIOLOGY REPORT*  Clinical Data: Altered mental status, atrial fibrillation, hypertension, and hyperlipidemia.  Right occipital CVA.  MRA HEAD WITHOUT CONTRAST  Technique: Angiographic images of the Circle of Willis were obtained using MRA technique without intravenous contrast.  Comparison: 01/01/2013 MRI.  Findings: There is absent flow related enhancement in the upper cervical, petrous, cavernous internal carotid artery on the left. There is some reconstitution of the upper cavernous and supraclinoid internal carotid artery perhaps collateral flow from the left ophthalmic and/or retrograde flow via cross fill.  There is a small rounded  structure in the expected region of the posterior communicating artery origin on the left.  This measures up to 3 mm and could represent a small berry aneurysm or infundibulum.  The right internal carotid artery shows wide patency in its upper cervical and petrous segments.  There is mild nonstenotic irregularity in the inferior RICA cavernous segment.  The supraclinoid ICA on the right is mildly irregular.  The basilar artery is patent with the right vertebral the  dominant contributor.  The left vertebral does contribute to the basilar. There is smooth irregularity of the distal right vertebral without focal stenosis.  Focal irregularity of the distal left vertebral could represent a 50-75% stenosis (arrow).  Mid basilar stenosis between the superior cerebellar arteries and the anterior inferior cerebellar arteries approaches 50%.  The right anterior and middle cerebral arteries are widely patent proximally.  There is mild irregularity of the proximal left ACA and MCA. Diminished relative flow related enhancement left anterior circulation reflects carotid occlusion.  50% stenosis ambient segment right PCA.  Patent but disease bilateral superior cerebellar arteries.  Poorly visualized PICAs and AICAs. Moderate irregularity distal MCA branches bilaterally.  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  Intracranial atherosclerotic change as described.   Original Report Authenticated By: Davonna Belling, M.D.   Mr Laqueta Jean Wo Contrast  01/01/2013   *RADIOLOGY REPORT*  Clinical Data: Altered mental status.  History of high blood pressure and hyperlipidemia.  Thyroid cancer.  MRI HEAD WITHOUT AND WITH CONTRAST  Technique:  Multiplanar, multiecho pulse sequences of the brain and surrounding structures were obtained according to standard protocol without and with intravenous contrast   Contrast: 10mL MULTIHANCE GADOBENATE DIMEGLUMINE 529 MG/ML IV SOLN  Comparison: 11/26/2003 head CT.  No comparison brain MR.  Findings: Motion degraded exam.   Tiny acute right occipital lobe infarct.  Remote infarct with blood stained encephalomalacia left caudate head and subsequent mild dilation of the adjacent left lateral ventricle.  Remote small thalamic infarcts larger on the left.  Moderate small vessel disease type changes.  Atrophy with ventricular prominence felt to be related to atrophy rather hydrocephalus.  Left internal carotid artery appears to be occluded or significantly narrowed with reconstitution of flow supraclinoid region.  Transverse ligament hypertrophy.  Cervical medullary junction unremarkable.  Partial empty sella incidentally noted.  No intracranial mass or abnormal enhancement noted on the significantly motion degraded images.  IMPRESSION: Motion degraded exam.  Tiny acute right occipital lobe infarct.  Remote infarct with blood stained encephalomalacia left caudate head and subsequent mild dilation of the adjacent left lateral ventricle.  Remote small thalamic infarcts larger on the left.  Moderate small vessel disease type changes.  Left internal carotid artery appears to be occluded or significantly narrowed with reconstitution of flow supraclinoid region.  Critical Value/emergent results were called by telephone at the time of interpretation on 01/01/2013 at 2:30 p.m. to [facility] patient's nurse, who verbally acknowledged these results.   Original Report Authenticated By: Lacy Duverney, M.D.   Nm Pulmonary Perf And Vent  12/30/2012   *RADIOLOGY REPORT*  Clinical Data:  Shortness of breath with wheezing.  Papillary thyroid cancer.  Question pulmonary embolism.  NUCLEAR MEDICINE VENTILATION - PERFUSION LUNG SCAN  Technique:  Ventilation images were obtained in multiple projections using inhaled aerosol technetium 99 M DTPA.  Perfusion images were obtained in multiple projections after  intravenous injection of Tc-80m MAA.  Radiopharmaceuticals:  Tc-11m DTPA aerosol and 6.0 mCi Tc-25m MAA.  Comparison: Chest radiographs 12/29/2012.  PET CT 12/15/2012.  Findings:  Ventilation:  There is prominent central clumping of the aerosol within the trachea and proximal bronchi in asymmetric to the left. There are patchy ventilatory defects in both lungs suggesting underlying obstructive lung disease.  Free pertechnetate is noted within the stomach.  Perfusion:  The perfusion appears matched to the  ventilatory examination.  There are no perfusion defects to suggest pulmonary embolism.  IMPRESSION:  1.  Low probability for acute pulmonary embolism. 2.  Central aerosol clumping and patchy ventilation suggesting obstructive lung disease.   Original Report Authenticated By: Carey Bullocks, M.D.   Nm Pet Image Initial (pi) Skull Base To Thigh  12/15/2012   *RADIOLOGY REPORT*  Clinical Data: Initial treatment strategy for thyroid cancer.  NUCLEAR MEDICINE PET SKULL BASE TO THIGH  Fasting Blood Glucose:  91  Technique:  17.5  mCi F-18 FDG was injected intravenously. CT data was obtained and used for attenuation correction and anatomic localization only.  (This was not acquired as a diagnostic CT examination.) Additional exam technical data entered on technologist worksheet.  Comparison:  12/02/2012  Findings:  Neck: Multiple hypermetabolic left supraclavicular lymph nodes are identified.  Index lymph node measures 1.4 cm and has an SUV max equal to 17.4, image 51.  More medially there is a left supraclavicular lymph node measuring 1.7 cm.  This has an SUV max equal to 19.8, image 63.  Chest:   Hypermetabolic left retropectoral lymph node measures 1.5 cm and has an SUV max equal to 23.2.  There are several enlarged and hypermetabolic lymph nodes in the superior mediastinum.  The largest is in the left paratracheal region measuring 1.6 cm.  This has an SUV max equal to 15.7.  The heart size appears enlarged.  There are prominent calcifications involving the RCA, LAD and left circumflex coronary artery.  No pleural effusion identified. Pulmonary parenchymal nodule in the right lower lobe is hypermetabolic measuring 1.2 cm.  The SUV max is equal to 4.0, image number 115.  Abdomen/Pelvis:  No abnormal hypermetabolic activity within the liver, pancreas, adrenal glands, or spleen.  No hypermetabolic lymph nodes in the abdomen or pelvis. There is mild atrophy of the upper pole the right kidney.  Skeleton:  There are multiple right rib fractures.  There is a lytic lesion within the T1 vertebra measuring 1 cm.  This has an SUV max equal to 7.6, image 58.  IMPRESSION:  1.  Extensive hypermetabolic adenopathy is noted within the left supraclavicular region, left retropectoral region and superior mediastinum. 2.  Hypermetabolic lytic bone lesion is noted involving the T1 vertebra. 3.  There is a nodule in the right lower lobe which exhibits malignant range FDG uptake.  This does not have the typical appearance of a thyroid cancer pulmonary metastasis.  Cannot exclude primary pulmonary neoplasm.   Original Report Authenticated By: Signa Kell, M.D.   Dg Chest Port 1 View  12/07/2012   *RADIOLOGY REPORT*  Clinical Data: Cough.  Chest pain.  Leukocytosis.  PORTABLE CHEST - 1 VIEW  Comparison: 06/16, 06/15, 06/13, and 10/19/2012  Findings: The infiltrate and effusion at the left lung base have appreciably diminished.  There is chronic elevation of the left hemidiaphragm.  There is slight new pulmonary vascular prominence.  Right lung is otherwise clear.  Chronic cardiomegaly.  Extensive calcification in the thoracic aorta.  Multiple surgical clips in the left axilla.  IMPRESSION: Appreciable improvement in the infiltrate and effusion at the left base.  New prominence of the pulmonary vascularity.   Original Report Authenticated By: Francene Boyers, M.D.   Dg Swallowing Func-speech Pathology  12/09/2012   Gray Bernhardt, CCC-SLP      12/09/2012 10:45 AM Objective Swallowing Evaluation: Modified Barium Swallowing Study   Patient Details  Name: CARROL BONDAR MRN: [id number] Date of Birth: [birth year]  Today's Date:  12/09/2012 Time: 0935-1005 SLP Time Calculation (min): 30 min  Past Medical History:  Past Medical History  Diagnosis Date  . Hypertension   . Hyperlipidemia   . Peripheral vascular disease     Hx of Ax-fem BPG, known LCCA disease  . CHF (congestive heart failure)     Ischemic cardiomyopathy EF 30 - 35%  . Cancer     skin cancer, Thyroid cancer  . Stroke 2004    minor  . Dysrhythmia     PAF  . COPD (chronic obstructive pulmonary disease)     PT DENIES USES O2 2 LITERS HS  . Heart murmur     Mild-moderate MR  . Hepatitis     AT AGE  31; unknown type    . Heart attack     03/17/12; subtotally occluded RCI with unsucessful PCI  . Coronary artery disease     Cardiologist is Dr. Nanetta Batty   . Thyroid cancer     Papillary  . Renal insufficiency    Past Surgical History:  Past Surgical History  Procedure Laterality Date  . Bypass graft  07/2010    axillobifemoral BPG  . Spine surgery  11/23/06    microdiscectomy L5-S1  . Hemorrhoid surgery    . Tonsillectomy    . Hernia repair  02/09/11    Ventral  . Cardiac catheterization      2013  . Coronary angioplasty      2013  . Thyroidectomy Bilateral 10/07/2012    Procedure: THYROIDECTOMY;  Surgeon: Darletta Moll, MD;  Location:  [facility] OR;  Service: ENT;  Laterality: Bilateral;  . Radical neck dissection Left 10/07/2012    Procedure: RADICAL NECK DISSECTION;  Surgeon: Darletta Moll, MD;   Location: [facility] OR;  Service: ENT;  Laterality: Left;   HPI:  pmh significant for MI, COPD, ICM, respiratory failure, and  thyroid CA with thyroidectomy and radical neck dissection  10/07/12.  BSE was ordered, however, SLP deferred bedside  evaluation to proceed directly to objective study, given pt's  history of pharyngeal dysphagia.     Assessment / Plan / Recommendation Clinical Impression  Dysphagia Diagnosis: Moderate oral  phase dysphagia;Moderate  pharyngeal phase dysphagia Clinical impression: Pr exhibits oral motor function within  functional limits.  Pts pharyngeal dysphagia is sensory and motor  based, and is characterized by delated swallow reflex, and poor  airway protection.  Swallow reflex was noted to trigger at the  vallecula across consistencies.  Penetration of thin liquids was  seen via cup and straw, and regardless of compensatory positions.   Amount was mild, but penetrate was not cleared spontaneously.   Nectar thick liquid via cup and straw was not penetrated.  Puree,  cracker, and barium table were all tolerated without penetration  or aspiration.  An additional bolus of applesauce was given to  facilitate clearing of the carium tablet in the esophagus    Treatment Recommendation  Therapy as outlined in treatment plan below    Diet Recommendation Dysphagia 3 (Mechanical Soft);Nectar-thick  liquid (chop meats)   Liquid Administration via: Cup;Straw Medication Administration: Whole meds with puree (provide  additional bolus to clear esophagus) Supervision: Patient able to self feed;Intermittent supervision  to cue for compensatory strategies Compensations: Slow rate;Small sips/bites Postural Changes and/or Swallow Maneuvers: Seated upright 90  degrees;Upright 30-60 min after meal    Other  Recommendations Oral Care Recommendations: Oral care BID Other Recommendations: Order thickener from pharmacy;Clarify  dietary restrictions   Follow Up Recommendations  24 hour supervision/assistance    Frequency and Duration min 1 x/week  1 week   Pertinent Vitals/Pain Pt reports pain in right shoulder (9/10)    SLP Swallow Goals Patient will consume recommended diet without observed clinical  signs of aspiration with: Supervision/safety Swallow Study Goal #1 - Progress: Progressing toward goal Patient will utilize recommended strategies during swallow to  increase swallowing safety with: Supervision/safety Swallow Study Goal #2 -  Progress: Progressing toward goal   General HPI: pmh significant for MI, COPD, ICM, respiratory  failure, and thyroid CA with thyroidectomy and radical neck  dissection 10/07/12.  BSE was ordered, however, SLP deferred  bedside evaluation to proceed directly to objective study, given  pt's history of pharyngeal dysphagia. Type of Study: Modified Barium Swallowing Study Reason for Referral: Objectively evaluate swallowing function Diet Prior to this Study: Regular;Thin liquids Temperature Spikes Noted: No Respiratory Status: Room air History of Recent Intubation: No Behavior/Cognition: Alert;Cooperative;Hard of hearing;Pleasant  mood Oral Cavity - Dentition: Adequate natural dentition Oral Motor / Sensory Function: Within functional limits Self-Feeding Abilities: Able to feed self Patient Positioning: Upright in chair Baseline Vocal Quality: Clear Volitional Cough: Weak Volitional Swallow: Able to elicit Anatomy: Within functional limits Pharyngeal Secretions: Not observed secondary MBS    Reason for Referral Objectively evaluate swallowing function   Oral Phase Oral Preparation/Oral Phase Oral Phase: WFL   Pharyngeal Phase Pharyngeal Phase Pharyngeal Phase: Impaired Pharyngeal - Nectar Pharyngeal - Nectar Cup: Delayed swallow initiation;Premature  spillage to valleculae Pharyngeal - Nectar Straw: Delayed swallow initiation;Premature  spillage to valleculae Pharyngeal - Thin Pharyngeal - Thin Cup: Delayed swallow initiation;Premature  spillage to valleculae;Premature spillage to pyriform  sinuses;Reduced airway/laryngeal closure;Penetration/Aspiration  before swallow;Compensatory strategies attempted (Comment) (chin  tuck ineffective in preventing penetration) Penetration/Aspiration details (thin cup): Material enters  airway, remains ABOVE vocal cords and not ejected out Pharyngeal - Thin Straw: Delayed swallow initiation;Premature  spillage to valleculae Pharyngeal - Solids Pharyngeal - Puree: Delayed swallow  initiation;Premature spillage  to valleculae Pharyngeal - Multi-consistency: Delayed swallow  initiation;Premature spillage to valleculae Pharyngeal - Pill: Delayed swallow initiation;Premature spillage  to valleculae  Cervical Esophageal Phase       Celia B. Murvin Natal [facility], CCC-SLP [phone] Cervical Esophageal Phase Cervical Esophageal Phase: Impaired Cervical Esophageal Phase - Solids Pill: Other (Comment) (additional bolus required to clear  esophagus of pill)         Leigh Aurora 12/09/2012, 10:44 AM     Microbiology: Recent Results (from the past 240 hour(s))  CULTURE, BLOOD (ROUTINE X 2)     Status: None   Collection Time    12/29/12  3:57 PM      Result Value Range Status   Specimen Description BLOOD HAND RIGHT   Final   Special Requests BOTTLES DRAWN AEROBIC ONLY 2.5CC   Final   Culture  Setup Time 12/29/2012 23:13   Final   Culture     Final   Value:        BLOOD CULTURE RECEIVED NO GROWTH TO DATE CULTURE WILL BE HELD FOR 5 DAYS BEFORE ISSUING A FINAL NEGATIVE REPORT   Report Status PENDING   Incomplete  CULTURE, BLOOD (ROUTINE X 2)     Status: None   Collection Time    12/29/12  4:03 PM      Result Value Range Status   Specimen Description BLOOD ARM LEFT   Final   Special Requests BOTTLES DRAWN AEROBIC ONLY 10CC   Final   Culture  Setup  Time 12/29/2012 23:13   Final   Culture     Final   Value:        BLOOD CULTURE RECEIVED NO GROWTH TO DATE CULTURE WILL BE HELD FOR 5 DAYS BEFORE ISSUING A FINAL NEGATIVE REPORT   Report Status PENDING   Incomplete     Labs: Basic Metabolic Panel:  Recent Labs Lab 12/30/12 0157 12/31/12 0425 01/01/13 0350 01/02/13 0610 01/03/13 0719  NA 139 132* 135 133* 139  K 3.9 3.6 3.6 3.4* 3.7  CL 92* 88* 91* 89* 92*  CO2 39* 38* 38* 36* 38*  GLUCOSE 123* 140* 100* 94 89  BUN 24* 33* 36* 37* 34*  CREATININE 1.45* 1.40* 1.39* 1.41* 1.43*  CALCIUM 10.6* 10.1 10.4 10.2 9.6   Liver Function Tests: No results found for this  basename: AST, ALT, ALKPHOS, BILITOT, PROT, ALBUMIN,  in the last 168 hours No results found for this basename: LIPASE, AMYLASE,  in the last 168 hours  Recent Labs Lab 01/01/13 0355  AMMONIA 29   CBC:  Recent Labs Lab 12/29/12 1421 01/01/13 0350  WBC 10.3 12.5*  NEUTROABS 7.4  --   HGB 9.3* 8.7*  HCT 29.8* 27.1*  MCV 82.3 80.2  PLT 358 332   Cardiac Enzymes:  Recent Labs Lab 12/29/12 1426 12/30/12 0012  TROPONINI <0.30 <0.30   BNP: BNP (last 3 results)  Recent Labs  12/29/12 1426 01/01/13 0350 01/02/13 0610  PROBNP 46080.0* 33652.0* 34244.0*   CBG: No results found for this basename: GLUCAP,  in the last 168 hours     Signed:  Carolyne Littles, J  [facility] 01/04/2013, 8:05 AM

## 2013-01-04 NOTE — Progress Notes (Signed)
Patient evaluated for community based chronic disease management services with [facility] Care Management Program as a benefit of patient's [employer]. Patient will receive a post discharge transition of care call and will be evaluated for monthly home visits for assessments and disease process education. Spoke with patient at bedside to explain [facility] Care Management services.  Services have been accepted and written consents obtained.  Patient lives with his grand daughter Trigo Winterbottom [phone].  She is his primary contact.  His cell number is [phone].  Patient was admitted on 7.10.14 with acute on chronic respiratory failure secondary to recurrent PNA.  He has an active history of CHF and COPD.  He is trying to quit smoking and did have "a smoke" prior to admission.  He is unclear on why he is not stable at home.  He and his immediate caregivers could benefit from chronic respiratory disease management.  Left contact information and [facility] literature at bedside. Made inpatient Case Manager aware that [facility] Care Management following. Of note, [facility] Care Management services does not replace or interfere with any services that are arranged by inpatient case management or social work.  For additional questions or referrals please contact Anibal Henderson BSN RN [facility] Liaison at [phone].

## 2013-01-04 NOTE — Progress Notes (Signed)
PULMONARY  / CRITICAL CARE MEDICINE  Name: [name] [name] MRN: [id number] DOB: [birth year]    ADMISSION DATE:  12/29/2012 CONSULTATION DATE:  01/02/13  REFERRING MD :  Dr. Herbie Baltimore, Cardiology. PRIMARY SERVICE:  [facility]  CHIEF COMPLAINT:  Morning somnolence.  BRIEF PATIENT DESCRIPTION: 72 year old male with history of COPD O2 dependent at 2L [place] at home who continues to smoke.  Has history of heart failure, CKD and PVD.  Noted to be more somnolent every AM by family and while in the hospital.  Cardiology asked we see patient for COPD and early morning somnolence.  Patient is arousable but lethargic and not providing much history.  SIGNIFICANT EVENTS / STUDIES:  7/14 pulmonary consult for COPD management and early morning somnolence.  LINES / TUBES: PIV  CULTURES: Blood 7/11>>>NTD  ANTIBIOTICS: Amoxacillin/Clav 7/11>>> 7/15  SUBJECTIVE:  Feels much better today, more oriented and alert Denies dyspnea, some cough w white mucous  VITAL SIGNS: Temp:  [98 F (36.7 C)-98.2 F (36.8 C)] 98.2 F (36.8 C) (07/16 0650) Pulse Rate:  [70-92] 72 (07/16 0650) Resp:  [16-20] 18 (07/16 0650) BP: (133-154)/(58-76) 133/62 mmHg (07/16 0650) SpO2:  [96 %-100 %] 96 % (07/16 0803) Weight:  [56.5 kg (124 lb 9 oz)] 56.5 kg (124 lb 9 oz) (07/16 0650)  PHYSICAL EXAMINATION: General:  Chronically ill appearing male, mild respiratory distress. Neuro:  Awake but lethargic, moves all ext to command. HEENT:  [place]/AT, PERRL, EOM-I and MMM. Neck:  Supple, +JVD and neck mass left of midline. Cardiovascular:  RRR, nl S1/S2, -M/R/G. Lungs:  Distant with bibasilar crackles. Abdomen:  Soft, NT, ND and +BS. Musculoskeletal:  2+ edema bilaterally. Skin:  Intact.   Recent Labs Lab 01/01/13 0350 01/02/13 0610 01/03/13 0719  NA 135 133* 139  K 3.6 3.4* 3.7  CL 91* 89* 92*  CO2 38* 36* 38*  BUN 36* 37* 34*  CREATININE 1.39* 1.41* 1.43*  GLUCOSE 100* 94 89    Recent Labs Lab 12/29/12 1421  01/01/13 0350  HGB 9.3* 8.7*  HCT 29.8* 27.1*  WBC 10.3 12.5*  PLT 358 332    Recent Labs Lab 12/29/12 1904 12/30/12 1040 01/03/13 0403  PHART 7.422 7.452* 7.460*  PCO2ART 62.4* 54.7* 53.4*  PO2ART 67.2* 63.1* 68.3*  HCO3 39.9* 37.6* 37.4*  TCO2 41.8 39.3 39.1  O2SAT 93.5 91.4 93.2    Mr Mra Head Wo Contrast  01/02/2013   *RADIOLOGY REPORT*  Clinical Data: Altered mental status, atrial fibrillation, hypertension, and hyperlipidemia.  Right occipital CVA.  MRA HEAD WITHOUT CONTRAST  Technique: Angiographic images of the Circle of Willis were obtained using MRA technique without intravenous contrast.  Comparison: 01/01/2013 MRI.  Findings: There is absent flow related enhancement in the upper cervical, petrous, cavernous internal carotid artery on the left. There is some reconstitution of the upper cavernous and supraclinoid internal carotid artery perhaps collateral flow from the left ophthalmic and/or retrograde flow via cross fill.  There is a small rounded structure in the expected region of the posterior communicating artery origin on the left.  This measures up to 3 mm and could represent a small berry aneurysm or infundibulum.  The right internal carotid artery shows wide patency in its upper cervical and petrous segments.  There is mild nonstenotic irregularity in the inferior RICA cavernous segment.  The supraclinoid ICA on the right is mildly irregular.  The basilar artery is patent with the right vertebral the dominant contributor.  The left vertebral does contribute to  the basilar. There is smooth irregularity of the distal right vertebral without focal stenosis.  Focal irregularity of the distal left vertebral could represent a 50-75% stenosis (arrow).  Mid basilar stenosis between the superior cerebellar arteries and the anterior inferior cerebellar arteries approaches 50%.  The right anterior and middle cerebral arteries are widely patent proximally.  There is mild irregularity of  the proximal left ACA and MCA. Diminished relative flow related enhancement left anterior circulation reflects carotid occlusion.  50% stenosis ambient segment right PCA.  Patent but disease bilateral superior cerebellar arteries.  Poorly visualized PICAs and AICAs. Moderate irregularity distal MCA branches bilaterally.  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  Intracranial atherosclerotic change as described.   Original Report Authenticated By: Davonna Belling, M.D.    ASSESSMENT / PLAN:  72 year old male with COPD history 2L [place].  His somnolence and disorientation seem to be improved.   Neuro is following and has identified small acute occipital CVA but doubt that this explained encephalopathy. SOB is likely multi-factorial with COPD and heart failure, has improved with diuretics. His COPD appears to be baseline. ? Whether stopping the steroids has helped his MS some.  Patient is DNR. Plan: - Agree with home bronchodilator regimen - No need for treatment as acute exacerbation of COPD, no need for steroids and abx  > d/c'd pred and augmentin 7/15 - Continue diureses as tolerated per cards. - if CO2 drifts above 40 with diuresis then consider treating temporarily w acetozolamide - CVA eval and rx as per Neuro recs   Levy Pupa, MD, PhD 01/04/2013, 11:04 AM  Pulmonary and Critical Care [phone] or if no answer [phone]

## 2013-01-04 NOTE — Evaluation (Signed)
Physical Therapy Evaluation Patient Details Name: [name] [name] MRN: [id number] DOB: [birth year] Today's Date: 01/04/2013 Time: 4098-1191 PT Time Calculation (min): 18 min  PT Assessment / Plan / Recommendation History of Present Illness    Pt is a 72 year old male with a history of COPD (O2--2L), coronary artery disease, systolic and diastolic CHF, continued tobacco use, papillary thyroid carcinoma, CKD stage III, and hypertension who presented with 2-3 days of worsening shortness of breath.    Clinical Impression  Patient evaluated by Physical Therapy with no further acute PT needs identified. All education has been completed and the patient has no further questions. Pt states he feels close to baseline and hopes to d/c home tomorrow.  See below for any follow-up Physial Therapy or equipment needs. PT is signing off. Thank you for this referral.     PT Assessment  All further PT needs can be met in the next venue of care    Follow Up Recommendations  Home health PT    Does the patient have the potential to tolerate intense rehabilitation      Barriers to Discharge        Equipment Recommendations  None recommended by PT    Recommendations for Other Services     Frequency      Precautions / Restrictions Precautions Precautions: Fall Restrictions Weight Bearing Restrictions: No   Pertinent Vitals/Pain See mobility section, pt denies pain      Mobility  Bed Mobility Bed Mobility: Not assessed Transfers Transfers: Sit to Stand;Stand to Sit Sit to Stand: 6: Modified independent (Device/Increase time);From toilet Stand to Sit: 6: Modified independent (Device/Increase time);To chair/3-in-1 Ambulation/Gait Ambulation/Gait Assistance: 5: Supervision Ambulation Distance (Feet): 240 Feet Assistive device: None Ambulation/Gait Assistance Details: pt pushed dynamap, SaO2 on room air 89-90% until return to room and dropped to 85% so reapplied O2 [place], pt states he feels close  to his baseline Gait Pattern: Within Functional Limits    Exercises     PT Diagnosis:    PT Problem List: Decreased activity tolerance PT Treatment Interventions:       PT Goals(Current goals can be found in the care plan section)    Visit Information  Last PT Received On: 01/04/13 Assistance Needed: +1       Prior Functioning  Home Living Family/patient expects to be discharged to:: Private residence Living Arrangements: Other relatives Available Help at Discharge: Family Type of Home: House Home Access: Stairs to enter Secretary/administrator of Steps: 1 Entrance Stairs-Rails: None Home Layout: One level Home Equipment: Environmental consultant - 2 wheels;Cane - single point Prior Function Level of Independence: Independent Comments: states he wears O2 at night Communication Communication: No difficulties    Cognition  Cognition Arousal/Alertness: Awake/alert Behavior During Therapy: WFL for tasks assessed/performed    Extremity/Trunk Assessment Lower Extremity Assessment Lower Extremity Assessment: Overall WFL for tasks assessed   Balance    End of Session PT - End of Session Activity Tolerance: Patient tolerated treatment well Patient left: in chair;with call bell/phone within reach  GP     Jancie Kercher,KATHrine E 01/04/2013, 10:40 AM Zenovia Jarred, PT, DPT 01/04/2013 Pager: [phone]

## 2013-01-04 NOTE — Progress Notes (Signed)
Stroke Team Progress Note  HISTORY [name] [name] is an 72 y.o. male with a history of hypertension, hyperlipidemia, peripheral vascular disease, congestive heart failure, coronary artery disease and previous stroke, being evaluated by neurology because of abnormal MRI study with small right inferior occipital acute stroke. 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 Patient has had altered mental status but no focal deficits have been noted. Onset is unclear. Patient has been on aspirin and Plavix daily. NIH stroke score was 2. Patient was not a TPA candidate secondary to minimal deficits and unknown time of onset . He was admitted for further evaluation and treatment.  SUBJECTIVE No family at at bedside. afib confirmed during the night.  OBJECTIVE Most recent Vital Signs: Filed Vitals:   01/03/13 1932 01/03/13 2119 01/04/13 0650 01/04/13 0803  BP:  136/76 133/62   Pulse: 92 70 72   Temp:  98 F (36.7 C) 98.2 F (36.8 C)   TempSrc:  Oral Oral   Resp: 16 18 18    Height:      Weight:   56.5 kg (124 lb 9 oz)   SpO2:  100% 98% 96%   CBG (last 3)  No results found for this basename: GLUCAP,  in the last 72 hours  IV Fluid Intake:     MEDICATIONS  . albuterol  2.5 mg Nebulization QID  . aspirin EC  81 mg Oral Daily  . budesonide  0.25 mg Nebulization QID  . calcitRIOL  0.5 mcg Oral Daily  . calcium carbonate  1 tablet Oral TID WC & HS  . clopidogrel  75 mg Oral Daily  . digoxin  0.0625 mg Oral Daily  . feeding supplement  1 Container Oral TID BM  . furosemide  40 mg Intravenous BID  . heparin  5,000 Units Subcutaneous Q8H  . hydrALAZINE  75 mg Oral TID  . isosorbide mononitrate  60 mg Oral Daily  . levothyroxine  125 mcg Oral QAC breakfast  . metoprolol succinate  25 mg Oral Daily  . pantoprazole  40 mg Oral QAC breakfast  . potassium chloride  10 mEq Oral Daily  . potassium chloride  40 mEq Oral Once  . senna  1  tablet Oral QHS  . sodium chloride  3 mL Intravenous Q12H  . tiotropium  18 mcg Inhalation Daily   PRN:  sodium chloride, acetaminophen, albuterol, bisacodyl, nitroGLYCERIN, ondansetron (ZOFRAN) IV, oxyCODONE-acetaminophen, RESOURCE THICKENUP CLEAR, sodium chloride  Diet:  Dysphagia 3 nectar thick liquids Activity:  Bedrest, per HF protocol  DVT Prophylaxis:  Heparin 5000 units sq tid   CLINICALLY SIGNIFICANT STUDIES Basic Metabolic Panel:   Recent Labs Lab 01/02/13 0610 01/03/13 0719  NA 133* 139  K 3.4* 3.7  CL 89* 92*  CO2 36* 38*  GLUCOSE 94 89  BUN 37* 34*  CREATININE 1.41* 1.43*  CALCIUM 10.2 9.6   Liver Function Tests: No results found for this basename: AST, ALT, ALKPHOS, BILITOT, PROT, ALBUMIN,  in the last 168 hours CBC:   Recent Labs Lab 12/29/12 1421 01/01/13 0350  WBC 10.3 12.5*  NEUTROABS 7.4  --   HGB 9.3* 8.7*  HCT 29.8* 27.1*  MCV 82.3 80.2  PLT 358 332   Coagulation: No results found for this basename: LABPROT, INR,  in the last 168 hours Cardiac Enzymes:   Recent Labs Lab 12/29/12 1426 12/30/12 0012  TROPONINI <0.30 <0.30   Urinalysis:   Recent Labs Lab 12/29/12 1525  COLORURINE YELLOW  LABSPEC 1.008  PHURINE 7.5  GLUCOSEU NEGATIVE  HGBUR NEGATIVE  BILIRUBINUR NEGATIVE  KETONESUR NEGATIVE  PROTEINUR NEGATIVE  UROBILINOGEN 0.2  NITRITE NEGATIVE  LEUKOCYTESUR NEGATIVE   Lipid Panel    Component Value Date/Time   CHOL 170 01/02/2013 1053   TRIG 86 01/02/2013 1053   HDL 45 01/02/2013 1053   CHOLHDL 3.8 01/02/2013 1053   VLDL 17 01/02/2013 1053   LDLCALC 108* 01/02/2013 1053   HgbA1C  Lab Results  Component Value Date   HGBA1C 5.6 01/02/2013    Urine Drug Screen:   No results found for this basename: labopia,  cocainscrnur,  labbenz,  amphetmu,  thcu,  labbarb    Alcohol Level: No results found for this basename: ETH,  in the last 168 hours   CT of the brain    MRI of the brain  01/01/2013  Motion degraded exam.  Tiny  acute right occipital lobe infarct.  Remote infarct with blood stained encephalomalacia left caudate head and subsequent mild dilation of the adjacent left lateral ventricle.  Remote small thalamic infarcts larger on the left.  Moderate small vessel disease type changes.  Left internal carotid artery appears to be occluded or significantly narrowed with reconstitution of flow supraclinoid region.    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 Possible 2-3 mm left PCom aneurysm or  infundibulum. Intracranial atherosclerotic change.  2D Echocardiogram  EF 20-25% with no source of embolus. An atrial fibrillation rhythm was noted on this study, making this study technically difficult for the assessment of cardiac function. Frequent ectopy was noted on this study, making this study technically difficult for the assessment of cardiac function. Consistent with severe coronary artery disease, predominantly involving the RCA. Ischemic cardiomyopathy due to prior infarction, with normal right-sided filling pressure, elevated left-sided pressure, and reduced cardiac output. The dysfunction is both systolic and diastolic. No cardiac source of embolism was identified, but cannot be ruled out on the basis of this examination.  Carotid Doppler  There is 0-39% right ICA stenosis. The left CCA and ICA appear occluded. Right vertebral artery flow is antegrade.  CXR   12/31/2012 Persistent suggestion of left lower lobe infiltrate with associated small left pleural effusion. 12/29/2012 1. Abnormal aeration at the left base, suspicious for pneumonia with small pleural effusion. 2. Numerous nonacute, but potentially unhealed upper right rib fractures.  EKG  sinus bradycardia, 57  Therapy Recommendations   Physical Exam   Pleasant elderly Caucasian male currently not in distress.Awake alert. Afebrile. Head is nontraumatic.  Neck is supple without bruit. Hearing is normal. Cardiac exam no murmur or gallop. Lungs are clear to auscultation. Distal pulses are well felt. Neurological Exam :  Awake  Alert oriented x 3. Diminished recall and attention. Normal speech and language.eye movements full without nystagmus.fundi were not visualized. Vision acuity and fields appear normal. Hearing is normal. Palatal movements are normal. Face symmetric. Tongue midline. Normal strength, tone, reflexes and coordination. Normal sensation. Gait deferred.  ASSESSMENT [name] [name] is a 72 y.o. male presenting with altered mental status but no focal neurologic deficits. Imaging confirms a  tiny right inferior occipital acute infarct. Infarct felt to be an incidental finding, asymptomatic. Stroke embolic secondary to noted atrial fibrillation in echo lab. Patient also with incidental left ICA occlusion, not related to current stroke as it is in a different vascular distribution.  On aspirin 81 mg orally every day and clopidogrel 75 mg orally every day prior to admission. Now on aspirin 81 mg orally every day and clopidogrel 75 mg orally every day for secondary stroke prevention. Patient with no resultant visual field defects or other neuro deficits. Stroke work up completed. No therapy needs.  atrial fibrillation, per Dr. Herbie Baltimore, he thinks not on anticoagulation on admission due to fall risk - is deferring decision to Dr. Royann Shivers Hypertension Hyperlipidemia, LDL108, on no statin PTA, now on no statin, goal LDL < 100 (< 70 for diabetics) PVD CHR w/ er 30-35% Hx stroke COPD RI  Hospital day # 6  TREATMENT/PLAN  From stroke standpoint, recommend changing aspirin 81 mg orally every day and clopidogrel 75 mg orally every day to NOAC (Eliquis - less renal involvement) for secondary stroke prevention given afib. If this is not ok, need documentation of contraindication linking afib to why anticoagulant was not used. Note dose pending  cardiologist rounding.  Added low dose statin - zocor 20 mg - please discharge on the statin or document contraindication if you are not agreeable No further stroke workup indicated. Patient has a 10-15% risk of having another stroke over the next year, the highest risk is within 2 weeks of the most recent stroke/TIA (risk of having a stroke following a stroke or TIA is the same). Ongoing risk factor control by Primary Care Physician Stroke Service will sign off. Please call should any needs arise. Follow up with Dr. Pearlean Brownie, Stroke Clinic, in 2 months.    [name], MSN, RN, ANVP-BC, ANP-BC, [facility] Pager: [phone] 01/04/2013 11:28 AM  I have personally obtained a history, examined the patient, evaluated imaging results, and formulated the assessment and plan of care. I agree with the above.  Delia Heady, MD

## 2013-01-05 DIAGNOSIS — J9600 Acute respiratory failure, unspecified whether with hypoxia or hypercapnia: Secondary | ICD-10-CM

## 2013-01-05 LAB — BASIC METABOLIC PANEL
Chloride: 89 mEq/L — ABNORMAL LOW (ref 96–112)
Creatinine, Ser: 1.36 mg/dL — ABNORMAL HIGH (ref 0.50–1.35)
GFR calc Af Amer: 59 mL/min — ABNORMAL LOW (ref >90)
Potassium: 3.4 mEq/L — ABNORMAL LOW (ref 3.5–5.1)
Sodium: 134 mEq/L — ABNORMAL LOW (ref 135–145)

## 2013-01-05 MED ORDER — SIMVASTATIN 20 MG PO TABS: 20.0000 mg | ORAL_TABLET | Freq: Every day | ORAL | Status: AC

## 2013-01-05 MED ORDER — APIXABAN 2.5 MG PO TABS: 2.5000 mg | ORAL_TABLET | Freq: Two times a day (BID) | ORAL | Status: DC

## 2013-01-05 MED ORDER — FUROSEMIDE 10 MG/ML IJ SOLN: 40.0000 mg | Freq: Two times a day (BID) | INTRAMUSCULAR | Status: DC

## 2013-01-05 MED ORDER — BISACODYL 5 MG PO TBEC: 10.0000 mg | DELAYED_RELEASE_TABLET | Freq: Every day | ORAL | Status: AC | PRN

## 2013-01-05 MED ORDER — DIGOXIN 62.5 MCG PO TABS: 0.0625 mg | ORAL_TABLET | Freq: Every day | ORAL | Status: AC

## 2013-01-05 MED ORDER — SENNA 8.6 MG PO TABS: 1.0000 | ORAL_TABLET | Freq: Every day | ORAL | Status: AC

## 2013-01-05 MED ORDER — FUROSEMIDE 40 MG PO TABS
40.0000 mg | ORAL_TABLET | Freq: Two times a day (BID) | ORAL | Status: DC
  Administered 2013-01-06 – 2013-01-07 (×3): 40 mg via ORAL
  Filled 2013-01-05 (×6): qty 1

## 2013-01-05 NOTE — Progress Notes (Signed)
Speech Language Pathology Dysphagia Treatment Patient Details Name: [name] [name] MRN: [id number] DOB: 13-Feb-1941 Today's Date: 01/05/2013 Time: 1350-1404 SLP Time Calculation (min): 14 min  Assessment / Plan / Recommendation Clinical Impression  Family and RN report improvement in swallow function, as pt was requiring HTL prior to June 2014 MBS. Pt now receiving Dys 3 diet with nectar thick liquids, and exhibits no overt s/s aspiration per report.  Pt too lethargic for trials of advanced consistencies at this time.  Will continue to follow acutely, and provide trials when pt more alert. Precautions posted at [facility].      Diet Recommendation  Continue with Current Diet: Dysphagia 3 (mechanical soft);Nectar-thick liquid    SLP Plan Continue with current plan of care   Pertinent Vitals/Pain None indicated at this time.   Swallowing Goals  SLP Swallowing Goals Patient will consume recommended diet without observed clinical signs of aspiration with: Supervision/safety Swallow Study Goal #1 - Progress: Progressing toward goal Patient will utilize recommended strategies during swallow to increase swallowing safety with: Supervision/safety Swallow Study Goal #2 - Progress: Progressing toward goal  General Temperature Spikes Noted: No Respiratory Status: Supplemental O2 delivered via (comment) ([place]@3L ) Behavior/Cognition: Lethargic Oral Cavity - Dentition: Adequate natural dentition Patient Positioning: Partially reclined  Oral Cavity - Oral Hygiene Does patient have any of the following "at risk" factors?: Diet - patient on thickened liquids;Oxygen therapy - cannula, mask, simple oxygen devices Brush patient's teeth BID with toothbrush (using toothpaste with fluoride): Yes Patient is AT RISK - Oral Care Protocol followed (see row info): Yes   Dysphagia Treatment Treatment focused on: Patient/family/caregiver education Patient observed directly with PO's: No Reason PO's not observed:  Lethargic   Celia B. Murvin [facility], CCC-SLP [phone]     Leigh Aurora 01/05/2013, 2:10 PM

## 2013-01-05 NOTE — Progress Notes (Signed)
[facility]  Subjective: More alert today. Feeling better. No complaints.   Objective: Vital signs in last 24 hours: Temp:  [98 F (36.7 C)-98.2 F (36.8 C)] 98.2 F (36.8 C) (07/17 0523) Pulse Rate:  [62-83] 62 (07/17 0523) Resp:  [16-18] 16 (07/17 0523) BP: (126-144)/(50-59) 138/51 mmHg (07/17 0523) SpO2:  [98 %-100 %] 99 % (07/17 0523) Weight:  [122 lb 8 oz (55.566 kg)] 122 lb 8 oz (55.566 kg) (07/17 0523) Last BM Date: 01/04/13  Intake/Output from previous day: 07/16 0701 - 07/17 0700 In: 1080 [P.O.:1080] Out: 1829 [Urine:1826; Stool:3] Intake/Output this shift:    Medications Current Facility-Administered Medications  Medication Dose Route Frequency Provider Last Rate Last Dose  . 0.9 %  sodium chloride infusion  250 mL Intravenous PRN Catarina Hartshorn, MD      . acetaminophen (TYLENOL) tablet 650 mg  650 mg Oral Q4H PRN Catarina Hartshorn, MD      . albuterol (PROVENTIL) (5 MG/ML) 0.5% nebulizer solution 2.5 mg  2.5 mg Nebulization QID Kela Millin, MD   2.5 mg at 01/04/13 2003  . albuterol (PROVENTIL) (5 MG/ML) 0.5% nebulizer solution 2.5 mg  2.5 mg Nebulization Q4H PRN Kela Millin, MD      . apixaban (ELIQUIS) tablet 2.5 mg  2.5 mg Oral BID Drema Dallas, MD   2.5 mg at 01/04/13 2127  . bisacodyl (DULCOLAX) EC tablet 10 mg  10 mg Oral Daily PRN Adeline C Viyuoh, MD      . budesonide (PULMICORT) nebulizer solution 0.25 mg  0.25 mg Nebulization QID Kela Millin, MD   0.25 mg at 01/04/13 2003  . calcitRIOL (ROCALTROL) capsule 0.5 mcg  0.5 mcg Oral Daily Catarina Hartshorn, MD   0.5 mcg at 01/04/13 1143  . calcium carbonate (OS-CAL - dosed in mg of elemental calcium) tablet 500 mg of elemental calcium  1 tablet Oral TID WC & HS Catarina Hartshorn, MD   500 mg of elemental calcium at 01/05/13 0554  . digoxin (LANOXIN) tablet 0.0625 mg  0.0625 mg Oral Daily Lennette Bihari, MD   0.0625 mg at 01/04/13 1142  . feeding supplement (ENSURE) pudding 1 Container  1  Container Oral TID BM Tonye Becket, RD   1 Container at 01/04/13 1400  . furosemide (LASIX) injection 40 mg  40 mg Intravenous BID Kela Millin, MD   40 mg at 01/04/13 1800  . hydrALAZINE (APRESOLINE) tablet 75 mg  75 mg Oral TID Nada Boozer, NP   75 mg at 01/04/13 2127  . isosorbide mononitrate (IMDUR) 24 hr tablet 60 mg  60 mg Oral Daily Lennette Bihari, MD   60 mg at 01/04/13 1142  . levothyroxine (SYNTHROID, LEVOTHROID) tablet 125 mcg  125 mcg Oral QAC breakfast Catarina Hartshorn, MD   125 mcg at 01/05/13 0554  . metoprolol succinate (TOPROL-XL) 24 hr tablet 25 mg  25 mg Oral Daily Catarina Hartshorn, MD   25 mg at 01/04/13 1141  . nitroGLYCERIN (NITROSTAT) SL tablet 0.4 mg  0.4 mg Sublingual Q5 Min x 3 PRN Catarina Hartshorn, MD      . ondansetron [facility]) injection 4 mg  4 mg Intravenous Q6H PRN Catarina Hartshorn, MD      . oxyCODONE-acetaminophen (PERCOCET/ROXICET) 5-325 MG per tablet 1 tablet  1 tablet Oral Q4H PRN Catarina Hartshorn, MD   1 tablet at 01/02/13 2147  . pantoprazole (PROTONIX) EC tablet 40 mg  40 mg Oral QAC breakfast Adeline  Joselyn Glassman, MD   40 mg at 01/05/13 0554  . potassium chloride (K-DUR,KLOR-CON) CR tablet 10 mEq  10 mEq Oral Daily Catarina Hartshorn, MD   10 mEq at 01/04/13 1141  . potassium chloride SA (K-DUR,KLOR-CON) CR tablet 40 mEq  40 mEq Oral Once Enid Skeens, MD      . RESOURCE THICKENUP CLEAR   Oral PRN Catarina Hartshorn, MD      . senna [facility]) tablet 8.6 mg  1 tablet Oral QHS Kela Millin, MD   8.6 mg at 01/04/13 2127  . simvastatin (ZOCOR) tablet 20 mg  20 mg Oral q1800 Layne Benton, NP   20 mg at 01/04/13 1800  . sodium chloride 0.9 % injection 3 mL  3 mL Intravenous Q12H Catarina Hartshorn, MD   3 mL at 01/04/13 2127  . sodium chloride 0.9 % injection 3 mL  3 mL Intravenous PRN Catarina Hartshorn, MD      . tiotropium [facility]) inhalation capsule 18 mcg  18 mcg Inhalation Daily Catarina Hartshorn, MD   18 mcg at 01/04/13 0801    PE: General appearance: alert, cooperative and no distress Neck: + JVD to level of the  ear Lungs: clear to auscultation bilaterally Heart: irregularly irregular rhythm Extremities: no LEE Pulses: 2+ and symmetric Skin: warm and dry Neurologic: Grossly normal  Lab Results:  No results found for this basename: WBC, HGB, HCT, PLT,  in the last 72 hours BMET  Recent Labs  01/03/13 0719 01/05/13 0550  NA 139 134*  K 3.7 3.4*  CL 92* 89*  CO2 38* 37*  GLUCOSE 89 87  BUN 34* 29*  CREATININE 1.43* 1.36*  CALCIUM 9.6 9.0   BNP (last 3 results)  Recent Labs  01/01/13 0350 01/02/13 0610 01/04/13 1140  PROBNP 33652.0* 34244.0* 16959.0*   Assessment/Plan  Principal Problem:   Acute and chronic respiratory failure Active Problems:   COPD (chronic obstructive pulmonary disease)   Ischemic cardiomyopathy, EF 30-35% by echo 03/29/12 - improved to 50-55% by 05/2012, but back to 35% 09/2012   Pericardial effusion, moderate on echo 03/29/12, continues to be moderate this admit   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   Protein-calorie malnutrition, severe   CVA (cerebral infarction)   Acute pulmonary edema  Plan: BNP has improved significantly, down from 34,244 to 16,959. Pt reports subjective improvement as well. Renal function is improved, with SCr downtrending to 1.36. He still has + JVD on exam. Na+ has decreased from 139 to 134. Consider another day of IV Lasix. He is hypokalemic with K+ of 3.4. Supplement K+. He continues in PAF on telemetry. Ventricular rate is controlled. On BB and digoxin. Eliquis initiated yesterday for [facility]. Will continue to monitor.    LOS: 7 days    Jaylanni Eltringham M. Delmer Islam 01/05/2013 9:23 AM

## 2013-01-05 NOTE — Progress Notes (Signed)
PT Screen Note  Patient Details Name: [name] [name] MRN: [id number] DOB: September 17, 1940   Cancelled Treatment:    Reason Eval/Treat Not Completed: PT screened, no needs identified, will sign off.  PT evaluation performed yesterday (01/04/13) and PT eval & d/c from therapist.  Per PT note, pt was close to baseline however recommend HHPT to follow up for safety in home environment.  Spoke with pt and family and they agree with HHPT and all further PT needs to be addressed by HHPT. PT will sign off at this time.  Please reorder if pt has change in mobility.  Thanks!!   [name] 01/05/2013, 3:15 PM Jake Shark, PT DPT [phone]

## 2013-01-05 NOTE — Progress Notes (Signed)
PULMONARY  / CRITICAL CARE MEDICINE  Name: [name] [name] MRN: [id number] DOB: Jan 10, 1941    ADMISSION DATE:  12/29/2012 CONSULTATION DATE:  01/02/13  REFERRING MD :  Dr. Herbie Baltimore, Cardiology. PRIMARY SERVICE:  TRH  CHIEF COMPLAINT:  Morning somnolence.  BRIEF PATIENT DESCRIPTION: 72 year old male with history of COPD O2 dependent at 2L [place] at home who continues to smoke.  Has history of heart failure, CKD and PVD.  Noted to be more somnolent every AM by family and while in the hospital.  Cardiology asked we see patient for COPD and early morning somnolence.  Patient is arousable but lethargic and not providing much history.  SIGNIFICANT EVENTS / STUDIES:  7/14 pulmonary consult for COPD management and early morning somnolence.  LINES / TUBES: PIV  CULTURES: Blood 7/11>>>NTD  ANTIBIOTICS: Amoxacillin/Clav 7/11>>> 7/15  SUBJECTIVE:  Sleepy this am.  Denies SOB.   VITAL SIGNS: Temp:  [98 F (36.7 C)-98.2 F (36.8 C)] 98.2 F (36.8 C) (07/17 0523) Pulse Rate:  [62-83] 62 (07/17 0523) Resp:  [16-18] 16 (07/17 0523) BP: (126-144)/(50-59) 138/51 mmHg (07/17 0523) SpO2:  [98 %-100 %] 99 % (07/17 0523) Weight:  [122 lb 8 oz (55.566 kg)] 122 lb 8 oz (55.566 kg) (07/17 0523)  PHYSICAL EXAMINATION: General:  Chronically ill appearing male, NAD Neuro:  Drowsy, arousable, appropriate when aroused but back to sleep quickly. HEENT:  [place]/AT, PERRL, EOM-I and MMM. Neck:  Supple, +JVD and neck mass left of midline. Cardiovascular:  RRR, nl S1/S2, -M/R/G. Lungs:  Distant with bibasilar crackles. Abdomen:  Soft, NT, ND and +BS. Musculoskeletal:  2+ edema bilaterally. Skin:  Intact.   Recent Labs Lab 01/02/13 0610 01/03/13 0719 01/05/13 0550  NA 133* 139 134*  K 3.4* 3.7 3.4*  CL 89* 92* 89*  CO2 36* 38* 37*  BUN 37* 34* 29*  CREATININE 1.41* 1.43* 1.36*  GLUCOSE 94 89 87    Recent Labs Lab 12/29/12 1421 01/01/13 0350  HGB 9.3* 8.7*  HCT 29.8* 27.1*  WBC 10.3 12.5*   PLT 358 332    Recent Labs Lab 12/29/12 1904 12/30/12 1040 01/03/13 0403  PHART 7.422 7.452* 7.460*  PCO2ART 62.4* 54.7* 53.4*  PO2ART 67.2* 63.1* 68.3*  HCO3 39.9* 37.6* 37.4*  TCO2 41.8 39.3 39.1  O2SAT 93.5 91.4 93.2    No results found.  ASSESSMENT / PLAN:  72 year old male with COPD history 2L [place].  His morning somnolence and disorientation seem to be improved.   Neuro is following and has identified small acute occipital CVA but doubt that this explained encephalopathy. SOB is likely multi-factorial with COPD and heart failure, has improved with diuretics. His COPD appears to be baseline. ? Whether stopping the steroids has helped his MS some.  Patient is DNR. Plan: - Agree with home bronchodilator regimen - off prednisone/abx  - Continue diureses as tolerated per cards. - if CO2 drifts above 40 with diuresis then consider treating temporarily w acetozolamide - CVA eval and rx as per Neuro recs - pulm f/u as outpt   PCCM signing off, please call back if needed, will f/u as outpt    WHITEHEART,KATHRYN, NP 01/05/2013  8:46 AM Pager: [phone] or [phone]  *Care during the described time interval was provided by me and/or other providers on the critical care team. I have reviewed this patient's available data, including medical history, events of note, physical examination and test results as part of my evaluation.  Levy Pupa, MD, PhD  01/05/2013, 5:41 PM [facility] [phone] or if no answer [phone]

## 2013-01-05 NOTE — Progress Notes (Signed)
Pt. Seen and examined. Agree with the NP/PA-C note as written.  Continue IV diuresis today, may change to po tomorrow. Sodium is again trending down, which is a chronic issue for him. On Eliquis for anticoagulation.  Chrystie Nose, MD, [facility] Attending Cardiologist [facility]

## 2013-01-06 DIAGNOSIS — I739 Peripheral vascular disease, unspecified: Secondary | ICD-10-CM

## 2013-01-06 DIAGNOSIS — R0602 Shortness of breath: Secondary | ICD-10-CM

## 2013-01-06 LAB — BASIC METABOLIC PANEL
CO2: 42 mEq/L (ref 19–32)
Calcium: 9.2 mg/dL (ref 8.4–10.5)
Potassium: 3.5 mEq/L (ref 3.5–5.1)
Sodium: 134 mEq/L — ABNORMAL LOW (ref 135–145)

## 2013-01-06 NOTE — Progress Notes (Signed)
Subjective: Feels a lot better and wants to go home.   Objective: Vital signs in last 24 hours: Temp:  [97.8 F (36.6 C)-98.1 F (36.7 C)] 98 F (36.7 C) (07/18 0618) Pulse Rate:  [70-74] 70 (07/18 0618) Resp:  [16-18] 16 (07/18 0618) BP: (120-166)/(54-75) 120/54 mmHg (07/18 0618) SpO2:  [96 %-100 %] 97 % (07/18 0618) Weight:  [121 lb (54.885 kg)] 121 lb (54.885 kg) (07/18 0618) Last BM Date: 01/05/13  Intake/Output from previous day: 07/17 0701 - 07/18 0700 In: 1180 [P.O.:1180] Out: 1175 [Urine:1175] Intake/Output this shift: Total I/O In: 120 [P.O.:120] Out: -   Medications Current Facility-Administered Medications  Medication Dose Route Frequency Provider Last Rate Last Dose  . 0.9 %  sodium chloride infusion  250 mL Intravenous PRN Catarina Hartshorn, MD      . acetaminophen (TYLENOL) tablet 650 mg  650 mg Oral Q4H PRN Catarina Hartshorn, MD      . albuterol (PROVENTIL) (5 MG/ML) 0.5% nebulizer solution 2.5 mg  2.5 mg Nebulization QID Kela Millin, MD   2.5 mg at 01/05/13 1938  . albuterol (PROVENTIL) (5 MG/ML) 0.5% nebulizer solution 2.5 mg  2.5 mg Nebulization Q4H PRN Kela Millin, MD      . apixaban (ELIQUIS) tablet 2.5 mg  2.5 mg Oral BID Drema Dallas, MD   2.5 mg at 01/05/13 2203  . bisacodyl (DULCOLAX) EC tablet 10 mg  10 mg Oral Daily PRN Adeline C Viyuoh, MD      . budesonide (PULMICORT) nebulizer solution 0.25 mg  0.25 mg Nebulization QID Kela Millin, MD   0.25 mg at 01/05/13 1938  . calcitRIOL (ROCALTROL) capsule 0.5 mcg  0.5 mcg Oral Daily Catarina Hartshorn, MD   0.5 mcg at 01/05/13 1031  . calcium carbonate (OS-CAL - dosed in mg of elemental calcium) tablet 500 mg of elemental calcium  1 tablet Oral TID WC & HS Catarina Hartshorn, MD   500 mg of elemental calcium at 01/06/13 0616  . digoxin (LANOXIN) tablet 0.0625 mg  0.0625 mg Oral Daily Lennette Bihari, MD   0.0625 mg at 01/05/13 1029  . feeding supplement (ENSURE) pudding 1 Container  1 Container Oral TID BM Tonye Becket, RD   1 Container at 01/05/13 2000  . furosemide (LASIX) tablet 40 mg  40 mg Oral BID Drema Dallas, MD      . hydrALAZINE (APRESOLINE) tablet 75 mg  75 mg Oral TID Nada Boozer, NP   75 mg at 01/05/13 2203  . isosorbide mononitrate (IMDUR) 24 hr tablet 60 mg  60 mg Oral Daily Lennette Bihari, MD   60 mg at 01/05/13 1026  . levothyroxine (SYNTHROID, LEVOTHROID) tablet 125 mcg  125 mcg Oral QAC breakfast Catarina Hartshorn, MD   125 mcg at 01/06/13 [phone]  . metoprolol succinate (TOPROL-XL) 24 hr tablet 25 mg  25 mg Oral Daily Catarina Hartshorn, MD   25 mg at 01/05/13 1025  . nitroGLYCERIN (NITROSTAT) SL tablet 0.4 mg  0.4 mg Sublingual Q5 Min x 3 PRN Catarina Hartshorn, MD      . ondansetron [facility]) injection 4 mg  4 mg Intravenous Q6H PRN Catarina Hartshorn, MD      . oxyCODONE-acetaminophen (PERCOCET/ROXICET) 5-325 MG per tablet 1 tablet  1 tablet Oral Q4H PRN Catarina Hartshorn, MD   1 tablet at 01/02/13 2147  . pantoprazole (PROTONIX) EC tablet 40 mg  40 mg Oral QAC breakfast Kela Millin, MD  40 mg at 01/06/13 0616  . potassium chloride (K-DUR,KLOR-CON) CR tablet 10 mEq  10 mEq Oral Daily Catarina Hartshorn, MD   10 mEq at 01/05/13 1025  . potassium chloride SA (K-DUR,KLOR-CON) CR tablet 40 mEq  40 mEq Oral Once Enid Skeens, MD      . RESOURCE THICKENUP CLEAR   Oral PRN Catarina Hartshorn, MD      . senna [facility]) tablet 8.6 mg  1 tablet Oral QHS Kela Millin, MD   8.6 mg at 01/05/13 2203  . simvastatin (ZOCOR) tablet 20 mg  20 mg Oral q1800 Layne Benton, NP   20 mg at 01/04/13 1800  . sodium chloride 0.9 % injection 3 mL  3 mL Intravenous Q12H Catarina Hartshorn, MD   3 mL at 01/05/13 2203  . sodium chloride 0.9 % injection 3 mL  3 mL Intravenous PRN Catarina Hartshorn, MD      . tiotropium [facility]) inhalation capsule 18 mcg  18 mcg Inhalation Daily Catarina Hartshorn, MD   18 mcg at 01/04/13 0801    PE: General appearance: alert, cooperative and no distress Lungs: clear to auscultation bilaterally Heart: regular rate and rhythm, S1, S2 normal, no  murmur, click, rub or gallop Extremities: No LEE Pulses: 2+ and symmetric Skin: Warm and dry. Neurologic: Grossly normal  Lab Results:  No results found for this basename: WBC, HGB, HCT, PLT,  in the last 72 hours BMET  Recent Labs  01/05/13 0550 01/06/13 0543  NA 134* 134*  K 3.4* 3.5  CL 89* 89*  CO2 37* 42*  GLUCOSE 87 89  BUN 29* 23  CREATININE 1.36* 1.33  CALCIUM 9.0 9.2    Assessment/Plan  Principal Problem:   Acute and chronic respiratory failure Active Problems:   COPD (chronic obstructive pulmonary disease)   Ischemic cardiomyopathy, EF 30-35% by echo 03/29/12 - improved to 50-55% by 05/2012, but back to 35% 09/2012   Pericardial effusion, moderate on echo 03/29/12, continues to be moderate this admit   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   Protein-calorie malnutrition, severe   CVA (cerebral infarction)   Acute pulmonary edema  Plan:  Net fluids: +60ml/-1681ml.  PO lasix 40mg  ordered for last night and BID.  It was not given for some reason.   BP and HR stable.  SCr stable.  BNP and BMET in AM tomorrow then likely DC home.   He has bounced back three times now in the last 2-3 months.    LOS: 8 days    HAGER, BRYAN 01/06/2013 9:42 AM   Agree with note written by Jones Skene PAC  Recurrent CHF responsive to IV diuretics. The frequency of admissions is increasing. May want to get into the CHF clinic for closer OP F/U. Will need to be TCM 7 and be seen by a MLP every 2 weeks as OP. Exam benign. Lungs clear. No JVP or periph edema. BNP decreasing. Transitioned to PO diuretics.   Runell Gess 01/06/2013 10:55 AM

## 2013-01-07 LAB — BASIC METABOLIC PANEL
Glucose, Bld: 93 mg/dL (ref 70–99)
Potassium: 3.3 mEq/L — ABNORMAL LOW (ref 3.5–5.1)
Sodium: 132 mEq/L — ABNORMAL LOW (ref 135–145)

## 2013-01-07 MED ORDER — FUROSEMIDE 40 MG PO TABS: 40.0000 mg | ORAL_TABLET | Freq: Two times a day (BID) | ORAL | Status: DC

## 2013-01-07 NOTE — Progress Notes (Signed)
Subjective:  Less SOB. No CP.  Objective:  Temp:  [97.6 F (36.4 C)-98.2 F (36.8 C)] 98.2 F (36.8 C) (07/19 0652) Pulse Rate:  [66-78] 66 (07/19 0652) Resp:  [16-18] 18 (07/19 0652) BP: (122-162)/(51-81) 162/70 mmHg (07/19 0652) SpO2:  [98 %-100 %] 99 % (07/19 0652) Weight:  [122 lb 5.7 oz (55.5 kg)] 122 lb 5.7 oz (55.5 kg) (07/19 4696) Weight change: 1 lb 5.7 oz (0.615 kg)  Intake/Output from previous day: 07/18 0701 - 07/19 0700 In: 860 [P.O.:860] Out: 725 [Urine:725]  Intake/Output from this shift: Total I/O In: -  Out: 300 [Urine:300]  Physical Exam: General appearance: alert and no distress Neck: no adenopathy, no carotid bruit, no JVD, supple, symmetrical, trachea midline and thyroid not enlarged, symmetric, no tenderness/mass/nodules Lungs: clear to auscultation bilaterally Heart: Soft out flow mumrur Extremities: extremities normal, atraumatic, no cyanosis or edema  Lab Results: Results for orders placed during the hospital encounter of 12/29/12 (from the past 48 hour(s))  BASIC METABOLIC PANEL     Status: Abnormal   Collection Time    01/06/13  5:43 AM      Result Value Range   Sodium 134 (*) 135 - 145 mEq/L   Potassium 3.5  3.5 - 5.1 mEq/L   Chloride 89 (*) 96 - 112 mEq/L   CO2 42 (*) 19 - 32 mEq/L   Comment: CRITICAL RESULT CALLED TO, READ BACK BY AND VERIFIED WITH:     L. STUBBS,RN ON [id number] AT 0649 BY SHIPMANM   Glucose, Bld 89  70 - 99 mg/dL   BUN 23  6 - 23 mg/dL   Creatinine, Ser 1.32  0.50 - 1.35 mg/dL   Calcium 9.2  8.4 - 44.0 mg/dL   GFR calc non Af Amer 52 (*) >90 mL/min   GFR calc Af Amer 60 (*) >90 mL/min   Comment:            The eGFR has been calculated     using the CKD EPI equation.     This calculation has not been     validated in all clinical     situations.     eGFR's persistently     <90 mL/min signify     possible Chronic Kidney Disease.  BASIC METABOLIC PANEL     Status: Abnormal   Collection Time    01/07/13  6:05  AM      Result Value Range   Sodium 132 (*) 135 - 145 mEq/L   Potassium 3.3 (*) 3.5 - 5.1 mEq/L   Chloride 86 (*) 96 - 112 mEq/L   CO2 35 (*) 19 - 32 mEq/L   Glucose, Bld 93  70 - 99 mg/dL   BUN 22  6 - 23 mg/dL   Creatinine, Ser 1.02 (*) 0.50 - 1.35 mg/dL   Calcium 9.9  8.4 - 72.5 mg/dL   GFR calc non Af Amer 49 (*) >90 mL/min   GFR calc Af Amer 56 (*) >90 mL/min   Comment:            The eGFR has been calculated     using the CKD EPI equation.     This calculation has not been     validated in all clinical     situations.     eGFR's persistently     <90 mL/min signify     possible Chronic Kidney Disease.    Imaging: Imaging results have been reviewed  Assessment/Plan:   1.  Principal Problem: 2.   Acute and chronic respiratory failure 3. Active Problems: 4.   COPD (chronic obstructive pulmonary disease) 5.   Ischemic cardiomyopathy, EF 30-35% by echo 03/29/12 - improved to 50-55% by 05/2012, but back to 35% 09/2012 6.   Pericardial effusion, moderate on echo 03/29/12, continues to be moderate this admit 7.   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   CAD, unsuccessful RCA PCI Sept 2013 9.   Chronic renal disease, stage III 10.   SOB (shortness of breath) 11.   SVT (supraventricular tachycardia), poss PAF short bursts. 12.   Protein-calorie malnutrition, severe 13.   CVA (cerebral infarction) 14.   Acute pulmonary edema 15.   Time Spent Directly with Patient:  20 minutes  Length of Stay:  LOS: 9 days   He has transitioned to PO diuretics. Total I/O neg 2 liters since admission. BNP 16000. Scr stable. K+ low (needs repletion prior to D/C). Looks to be at dry weight. Should prob be on O2 24/7. EF 20%. Exam benign. Lungs clear. No periph edema. On eliquis anticoag for PAF. OK for D/C today. Will re check labs first. TCM 7. Will see back in office this week.  BERRY,[name] J 01/07/2013, 9:00 AM

## 2013-01-07 NOTE — Progress Notes (Signed)
Son -in-law present during discharge instructions.  Client educated about monitoring BP and heart rate with his medications.  He is to call MD if pulse less than 60 or BP starts to decrease.  Verbalized understanding of discharge instructions.

## 2013-01-08 MED ORDER — APIXABAN 2.5 MG PO TABS: 2.5000 mg | ORAL_TABLET | Freq: Two times a day (BID) | ORAL | Status: AC

## 2013-01-08 NOTE — Progress Notes (Signed)
Client called on 07/19 at 1830 about not being able to obtain his apixaban prescription at the pharmacy.  The family found out it had not been filled after they got home with the medications that they had gone to get.  Spoke with Dr. Joseph Art and informed that client that he could pick it up today.  However, they were not able to go back to their pharmacy since it is closed on Sundays.  The currently reside in [facility].    Today we have been back and forth about getting this medication.  [facility] [address] [facility], so with checking the physician [facility] [address].  Even with this there were issues about the medicine, the pharmacist informed me that this med requires preauthorization from a physician.  I was informed that he could purchase enough for 5 days for 68.99 and get fewer tabs for 30.29 which I informed the daughter about.  During the time I was on the phone with daughter the pharmacist spoke with another nurse about sending a preauthorization form here to the hospital.    MD notified that preauthorization is required for this medicine.

## 2013-01-09 ENCOUNTER — Telehealth: Payer: Self-pay | Admitting: Physician Assistant

## 2013-01-09 NOTE — Telephone Encounter (Signed)
TCM followup call.  I spoke to the patient who states he had his best day in a long time yesterday.  No SOB or LE edema. I told him our office scheduler will be calling him today to confirm his appt for this week.  Corrie Reder 11:11 AM

## 2013-01-10 ENCOUNTER — Telehealth: Payer: Self-pay | Admitting: *Deleted

## 2013-01-10 NOTE — Telephone Encounter (Signed)
Pts daughter called about appt for dad.  2 month post stroke f/u  from 12-29-12 (see in 02/2013) schedule with Earl Lites, NP afternoon when Dr. Pearlean Brownie here. LMVM for her to return call and speak to schedulers.

## 2013-01-11 ENCOUNTER — Encounter (HOSPITAL_COMMUNITY)
Admission: RE | Admit: 2013-01-11 | Discharge: 2013-01-11 | Disposition: A | Discharge status: Home / Self Care | Payer: Medicare Other | Source: Ambulatory Visit | Attending: Endocrinology | Admitting: Endocrinology

## 2013-01-11 ENCOUNTER — Telehealth: Payer: Self-pay | Admitting: Cardiovascular Disease

## 2013-01-11 NOTE — Telephone Encounter (Signed)
Ok to continue skilled nursing services. Verbal order given to [facility]

## 2013-01-11 NOTE — Telephone Encounter (Signed)
Discharged from hospital yesterday-did not know he was in the hospital-need order to continue service.

## 2013-01-12 ENCOUNTER — Encounter (HOSPITAL_COMMUNITY): Payer: Medicare Other

## 2013-01-13 ENCOUNTER — Encounter (HOSPITAL_COMMUNITY): Payer: Medicare Other

## 2013-01-13 ENCOUNTER — Encounter: Payer: Self-pay | Admitting: Cardiology

## 2013-01-13 ENCOUNTER — Telehealth: Payer: Self-pay | Admitting: Neurology

## 2013-01-13 ENCOUNTER — Ambulatory Visit (INDEPENDENT_AMBULATORY_CARE_PROVIDER_SITE_OTHER): Payer: Medicare Other | Admitting: Cardiology

## 2013-01-13 VITALS — BP 142/78 | HR 76 | Ht 68.0 in | Wt 129.8 lb

## 2013-01-13 DIAGNOSIS — R400 Somnolence: Secondary | ICD-10-CM

## 2013-01-13 DIAGNOSIS — I5043 Acute on chronic combined systolic (congestive) and diastolic (congestive) heart failure: Secondary | ICD-10-CM

## 2013-01-13 DIAGNOSIS — I509 Heart failure, unspecified: Secondary | ICD-10-CM

## 2013-01-13 DIAGNOSIS — I5022 Chronic systolic (congestive) heart failure: Secondary | ICD-10-CM

## 2013-01-13 DIAGNOSIS — J449 Chronic obstructive pulmonary disease, unspecified: Secondary | ICD-10-CM

## 2013-01-13 DIAGNOSIS — I471 Supraventricular tachycardia: Secondary | ICD-10-CM

## 2013-01-13 DIAGNOSIS — R0902 Hypoxemia: Secondary | ICD-10-CM

## 2013-01-13 DIAGNOSIS — R0989 Other specified symptoms and signs involving the circulatory and respiratory systems: Secondary | ICD-10-CM

## 2013-01-13 DIAGNOSIS — I255 Ischemic cardiomyopathy: Secondary | ICD-10-CM

## 2013-01-13 DIAGNOSIS — G4710 Hypersomnia, unspecified: Secondary | ICD-10-CM

## 2013-01-13 DIAGNOSIS — I498 Other specified cardiac arrhythmias: Secondary | ICD-10-CM

## 2013-01-13 DIAGNOSIS — I2589 Other forms of chronic ischemic heart disease: Secondary | ICD-10-CM

## 2013-01-13 DIAGNOSIS — R0689 Other abnormalities of breathing: Secondary | ICD-10-CM

## 2013-01-13 DIAGNOSIS — I2510 Atherosclerotic heart disease of native coronary artery without angina pectoris: Secondary | ICD-10-CM

## 2013-01-13 LAB — BASIC METABOLIC PANEL WITH GFR
BUN: 20 mg/dL (ref 6–23)
CO2: 36 mEq/L — ABNORMAL HIGH (ref 19–32)
Calcium: 9.6 mg/dL (ref 8.4–10.5)
Creat: 1.33 mg/dL (ref 0.50–1.35)
GFR, Est African American: 62 mL/min
Glucose, Bld: 83 mg/dL (ref 70–99)
Sodium: 138 mEq/L (ref 135–145)

## 2013-01-13 NOTE — Patient Instructions (Signed)
Have Blood work done today. Weigh daily Call [phone] if weight climbs more than 3 pounds in a day or 5 pounds in a week. No salt to very little salt in your diet.  No more than 2000 mg in a day. Call if increased shortness of breath or increased swelling.   follow up with Nada Boozer, [facility] in 2 weeks.

## 2013-01-14 LAB — PRO B NATRIURETIC PEPTIDE: Pro B Natriuretic peptide (BNP): 28493 pg/mL — ABNORMAL HIGH (ref <126)

## 2013-01-16 ENCOUNTER — Encounter: Payer: Self-pay | Admitting: Cardiology

## 2013-01-16 ENCOUNTER — Telehealth: Payer: Self-pay | Admitting: *Deleted

## 2013-01-16 DIAGNOSIS — R400 Somnolence: Secondary | ICD-10-CM | POA: Insufficient documentation

## 2013-01-16 HISTORY — DX: Somnolence: R40.0

## 2013-01-16 NOTE — Assessment & Plan Note (Signed)
To see Dr. Delton Coombes next week.

## 2013-01-16 NOTE — Progress Notes (Signed)
01/16/2013   PCP: [name], MD   Chief Complaint  Patient presents with  . Follow-up    post-hospital.  Occas. SOB .  No CP,edema or dizziness.    Primary Cardiologist: Dr. Allyson Sabal  HPI:  72 year old frail-appearing divorced white male with a history of coronary disease and peripheral vascular disease as well as COPD. His carotid and lower extremity diseases are followed by Dr. Foye Deer. He has known iliac and carotid disease and has had left axilliofemoral and femoral-femoral crossover grafts for claudication.  In September 2013 he had acute inferior wall myocardial infarction Dr. Eldridge Dace catheterized him using the right radial approach but was unable to revascularize his dominant RCA. He ultimately developed a large inferior wall MI complicated by atrial fibrillation and congestive heart failure ithe decreased EF, ischemic cardiomyopathy 30-35%. He was on amiodarone and diuresed and was also hyponatremic during that hospitalization. He wore a lifevest and his EF improved to 35-40% and the vest was discontinued.   December 2013 carotid Dopplers revealed a high-grade left common carotid artery stenosis which he's had in the past and correlates to 70% stenosis by CT angiography. He also has hypertension, hyperlipidemia, diabetes mellitus and thyroid cancer undergoing surgery recently.  He underwent total thyroidectomy with left modified radical neck dissection secondary to thyroid carcinoma with lymph node metastasis.  Recently with hospitalizations for congestive heart failure and acute COPD.  Most recent hospitalization July 10 through 01/07/2013 for somnolence  secondary to elevated CO2 and noted to have a new CVA (tiny acute right occipital lobe infarct ) followed by neuro.  His proBNP was elevated though at discharge it was down to 16,000.  Also prior to discharge his SpO2 was 85% with walking he was recommended to use oxygen 24 hours a day which the patient has not been  doing. Part of this disease does not have a portable tank.  He was also seen by pulmonary during hospitalization and also follow up with him as an outpatient.  Allergies  Allergen Reactions  . Ceclor (Cefaclor) Anaphylaxis    Recently took pcn for tooth problems, without issues  . Morphine And Related Other (See Comments)    Abnormal behavior    Current Outpatient Prescriptions  Medication Sig Dispense Refill  . albuterol (PROVENTIL) (5 MG/ML) 0.5% nebulizer solution Take 0.5 mLs (2.5 mg total) by nebulization every 3 (three) hours as needed for wheezing or shortness of breath.  20 mL  12  . apixaban (ELIQUIS) 2.5 MG TABS tablet Take 1 tablet (2.5 mg total) by mouth 2 (two) times daily.  60 tablet  0  . bisacodyl (DULCOLAX) 5 MG EC tablet Take 2 tablets (10 mg total) by mouth daily as needed.  30 tablet  0  . budesonide (PULMICORT) 0.25 MG/2ML nebulizer solution Take 2 mLs (0.25 mg total) by nebulization every 6 (six) hours.  60 mL  12  . calcitRIOL (ROCALTROL) 0.5 MCG capsule Take 0.5 mcg by mouth daily.      . calcium carbonate (OS-CAL - DOSED IN MG OF ELEMENTAL CALCIUM) 1250 MG tablet Take 1 tablet (500 mg of elemental calcium total) by mouth 4 (four) times daily.  120 tablet  5  . digoxin 62.5 MCG TABS Take 0.0625 mg by mouth daily.  90 tablet  0  . furosemide (LASIX) 40 MG tablet Take 1 tablet (40 mg total) by mouth 2 (two) times daily.  60 tablet  5  . gabapentin (NEURONTIN) 600 MG tablet Take 600  mg by mouth 2 (two) times daily.       . hydrALAZINE (APRESOLINE) 25 MG tablet Take 3 tablets (75 mg total) by mouth 2 (two) times daily.  180 tablet  5  . isosorbide mononitrate (IMDUR) 30 MG 24 hr tablet Take 1 tablet (30 mg total) by mouth daily.  30 tablet  6  . levothyroxine (SYNTHROID, LEVOTHROID) 125 MCG tablet Take 125 mcg by mouth daily before breakfast.      . Maltodextrin-Xanthan Gum (RESOURCE THICKENUP CLEAR) POWD As directed on the packaging.      . metoprolol succinate  (TOPROL-XL) 25 MG 24 hr tablet Take 25 mg by mouth daily.      . nitroGLYCERIN (NITROSTAT) 0.4 MG SL tablet Place 1 tablet (0.4 mg total) under the tongue every 5 (five) minutes x 3 doses as needed for chest pain.  25 tablet  4  . oxyCODONE-acetaminophen (PERCOCET/ROXICET) 5-325 MG per tablet Take 1-2 tablets by mouth every 4 (four) hours as needed for pain.  40 tablet  0  . pantoprazole (PROTONIX) 40 MG tablet Take 1 tablet (40 mg total) by mouth daily at 12 noon.  30 tablet  2  . potassium chloride (K-DUR,KLOR-CON) 10 MEQ tablet Take 10 mEq by mouth daily.      Marland Kitchen senna (SENOKOT) 8.6 MG TABS Take 1 tablet (8.6 mg total) by mouth at bedtime.  120 each  0  . simvastatin (ZOCOR) 20 MG tablet Take 1 tablet (20 mg total) by mouth daily at 6 PM.  90 tablet  0  . tiotropium (SPIRIVA) 18 MCG inhalation capsule Place 1 capsule (18 mcg total) into inhaler and inhale daily.  30 capsule  0   No current facility-administered medications for this visit.    Past Medical History  Diagnosis Date  . Hypertension   . Hyperlipidemia   . Peripheral vascular disease     Hx of Ax-fem BPG, known LCCA disease  . CHF (congestive heart failure)     Ischemic cardiomyopathy EF 30 - 35%  . Cancer     skin cancer, Thyroid cancer  . Stroke 2004    minor  . Dysrhythmia     PAF  . COPD (chronic obstructive pulmonary disease)     PT DENIES USES O2 2 LITERS HS  . Heart murmur     Mild-moderate MR  . Hepatitis     AT AGE  29; unknown type    . Heart attack     03/17/12; subtotally occluded RCI with unsucessful PCI  . Coronary artery disease     Cardiologist is Dr. Nanetta Batty   . Thyroid cancer     Papillary  . Renal insufficiency   . Somnolence, daytime, secondary to elevated CO2 01/16/2013    Past Surgical History  Procedure Laterality Date  . Bypass graft  07/2010    axillobifemoral BPG  . Spine surgery  11/23/06    microdiscectomy L5-S1  . Hemorrhoid surgery    . Tonsillectomy    . Hernia repair   02/09/11    [facility]  . Cardiac catheterization      2013  . Coronary angioplasty      2013  . Thyroidectomy Bilateral 10/07/2012    Procedure: THYROIDECTOMY;  Surgeon: Darletta Moll, MD;  Location: [facility] OR;  Service: ENT;  Laterality: Bilateral;  . Radical neck dissection Left 10/07/2012    Procedure: RADICAL NECK DISSECTION;  Surgeon: Darletta Moll, MD;  Location: [facility] OR;  Service: ENT;  Laterality:  Left;    ZOX:WRUEAVW:UJ colds or fevers, no weight changes-stated his weight was stable at home at 122.5 lbs.  Here his wt is 129 but this is with clothes and shoes. Skin:no rashes or ulcers HEENT:no blurred vision, no congestion CV:see HPI PUL:see HPI GI:no diarrhea constipation or melena, no indigestion GU:no hematuria, no dysuria MS:no joint pain, no claudication Neuro:no syncope, no lightheadedness, somewhat sleepy today compared to prior days. Endo:+ diabetes, + thyroid disease with cancer and thyroidectomy   PHYSICAL EXAM BP 142/78  Pulse 76  Ht 5\' 8"  (1.727 m)  Wt 129 lb 12.8 oz (58.877 kg)  BMI 19.74 kg/m2 General:Pleasant affect, NAD Skin:Warm and dry, brisk capillary refill HEENT:normocephalic, sclera clear, mucus membranes moist Neck:supple, 3cm JVD from the clavical, soft rt bruit  Heart:S1S2 RRR without murmur, gallup, rub or click, + premature beats Lungs:clear without rales, rhonchi, or wheezes WJX:BJYN, non tender, + BS, do not palpate liver spleen or masses Ext:no lower ext edema,  2+ radial pulses Neuro:alert and oriented, MAE, follows commands, + facial symmetry    ASSESSMENT AND PLAN Acute on chronic combined systolic and diastolic congestive heart failure- EF of 30-35% via echo in 4/14 Currently stable, will check labs including BNP.    SVT (supraventricular tachycardia), poss PAF short bursts. Occ. PAC on exam.  Somnolence, daytime, secondary to elevated CO2 If CO2 is > 40 Pul recommends treatment with acetozolamide.  COPD (chronic obstructive pulmonary  disease) To see Dr. Delton Coombes next week.  Hypoxemia Today with ambulation SPO2 drops to 80%, we have recommended using oxygen with exertion as well as at night.  We are arranging portable tank through home health.  Ischemic cardiomyopathy, EF 30-35% by echo 03/29/12 - improved to 50-55% by 05/2012, but back to 35% 09/2012 stable  CAD, unsuccessful RCA PCI Sept 2013 Mild to moderate lt side CAD noted on cath 02/2012, as well.   Patient currently stable with chronic systolic heart failure, will check labs to evaluate CO2 and lytes, and may increase his diuretic if needed.  I will see him back in 2 weeks to ensure he remains stable.  TCM patient he did receive telephone call after his discharge on the 19th.

## 2013-01-16 NOTE — Assessment & Plan Note (Signed)
stable °

## 2013-01-16 NOTE — Assessment & Plan Note (Signed)
Occ. PAC on exam.

## 2013-01-16 NOTE — Assessment & Plan Note (Signed)
Currently stable, will check labs including BNP.

## 2013-01-16 NOTE — Telephone Encounter (Signed)
Informed patient pr [name] [name] to take 80 mg in the morning and 40 mg in the afternoon for 1 day only. Then return to the regular schedule. Patient voiced understanding.

## 2013-01-16 NOTE — Assessment & Plan Note (Signed)
Mild to moderate lt side CAD noted on cath 02/2012, as well.

## 2013-01-16 NOTE — Assessment & Plan Note (Signed)
If CO2 is > 40 Pul recommends treatment with acetozolamide.

## 2013-01-16 NOTE — Assessment & Plan Note (Signed)
Today with ambulation SPO2 drops to 80%, we have recommended using oxygen with exertion as well as at night.  We are arranging portable tank through home health.

## 2013-01-16 NOTE — Telephone Encounter (Signed)
Message copied by Gaynelle Cage on Mon Jan 16, 2013  3:41 PM ------      Message from: Leone Brand      Created: Mon Jan 16, 2013  8:51 AM       Please ask Mr. Kuan to take 80 mg of Lasix in AM for tomorrow and the 40 mg in the evening tomorrow just for one day.  Other labs stable. ------

## 2013-01-18 ENCOUNTER — Emergency Department (HOSPITAL_COMMUNITY): Payer: Medicare Other

## 2013-01-18 ENCOUNTER — Inpatient Hospital Stay (HOSPITAL_COMMUNITY)
Admission: EM | Admit: 2013-01-18 | Discharge: 2013-01-26 | DRG: 177 | Disposition: A | Discharge status: Hospice | Payer: Medicare Other | Attending: Internal Medicine | Admitting: Internal Medicine

## 2013-01-18 ENCOUNTER — Encounter (HOSPITAL_COMMUNITY): Payer: Self-pay | Admitting: *Deleted

## 2013-01-18 DIAGNOSIS — E890 Postprocedural hypothyroidism: Secondary | ICD-10-CM | POA: Diagnosis present

## 2013-01-18 DIAGNOSIS — Z681 Body mass index (BMI) 19 or less, adult: Secondary | ICD-10-CM

## 2013-01-18 DIAGNOSIS — J449 Chronic obstructive pulmonary disease, unspecified: Secondary | ICD-10-CM

## 2013-01-18 DIAGNOSIS — G9340 Encephalopathy, unspecified: Secondary | ICD-10-CM | POA: Diagnosis present

## 2013-01-18 DIAGNOSIS — R0902 Hypoxemia: Secondary | ICD-10-CM

## 2013-01-18 DIAGNOSIS — J189 Pneumonia, unspecified organism: Secondary | ICD-10-CM | POA: Diagnosis present

## 2013-01-18 DIAGNOSIS — I2510 Atherosclerotic heart disease of native coronary artery without angina pectoris: Secondary | ICD-10-CM | POA: Diagnosis present

## 2013-01-18 DIAGNOSIS — K219 Gastro-esophageal reflux disease without esophagitis: Secondary | ICD-10-CM | POA: Diagnosis present

## 2013-01-18 DIAGNOSIS — E876 Hypokalemia: Secondary | ICD-10-CM | POA: Diagnosis present

## 2013-01-18 DIAGNOSIS — D638 Anemia in other chronic diseases classified elsewhere: Secondary | ICD-10-CM | POA: Diagnosis present

## 2013-01-18 DIAGNOSIS — J690 Pneumonitis due to inhalation of food and vomit: Principal | ICD-10-CM | POA: Diagnosis present

## 2013-01-18 DIAGNOSIS — R64 Cachexia: Secondary | ICD-10-CM | POA: Diagnosis present

## 2013-01-18 DIAGNOSIS — I252 Old myocardial infarction: Secondary | ICD-10-CM

## 2013-01-18 DIAGNOSIS — I5189 Other ill-defined heart diseases: Secondary | ICD-10-CM

## 2013-01-18 DIAGNOSIS — I70219 Atherosclerosis of native arteries of extremities with intermittent claudication, unspecified extremity: Secondary | ICD-10-CM

## 2013-01-18 DIAGNOSIS — E785 Hyperlipidemia, unspecified: Secondary | ICD-10-CM | POA: Diagnosis present

## 2013-01-18 DIAGNOSIS — I10 Essential (primary) hypertension: Secondary | ICD-10-CM | POA: Diagnosis present

## 2013-01-18 DIAGNOSIS — Z720 Tobacco use: Secondary | ICD-10-CM

## 2013-01-18 DIAGNOSIS — I739 Peripheral vascular disease, unspecified: Secondary | ICD-10-CM | POA: Diagnosis present

## 2013-01-18 DIAGNOSIS — I4891 Unspecified atrial fibrillation: Secondary | ICD-10-CM | POA: Diagnosis present

## 2013-01-18 DIAGNOSIS — Z66 Do not resuscitate: Secondary | ICD-10-CM | POA: Diagnosis present

## 2013-01-18 DIAGNOSIS — Z8673 Personal history of transient ischemic attack (TIA), and cerebral infarction without residual deficits: Secondary | ICD-10-CM

## 2013-01-18 DIAGNOSIS — I129 Hypertensive chronic kidney disease with stage 1 through stage 4 chronic kidney disease, or unspecified chronic kidney disease: Secondary | ICD-10-CM | POA: Diagnosis present

## 2013-01-18 DIAGNOSIS — Z79899 Other long term (current) drug therapy: Secondary | ICD-10-CM

## 2013-01-18 DIAGNOSIS — Z9981 Dependence on supplemental oxygen: Secondary | ICD-10-CM

## 2013-01-18 DIAGNOSIS — C73 Malignant neoplasm of thyroid gland: Secondary | ICD-10-CM | POA: Diagnosis present

## 2013-01-18 DIAGNOSIS — E871 Hypo-osmolality and hyponatremia: Secondary | ICD-10-CM

## 2013-01-18 DIAGNOSIS — J441 Chronic obstructive pulmonary disease with (acute) exacerbation: Secondary | ICD-10-CM | POA: Diagnosis present

## 2013-01-18 DIAGNOSIS — J9620 Acute and chronic respiratory failure, unspecified whether with hypoxia or hypercapnia: Secondary | ICD-10-CM

## 2013-01-18 DIAGNOSIS — G4710 Hypersomnia, unspecified: Secondary | ICD-10-CM

## 2013-01-18 DIAGNOSIS — I639 Cerebral infarction, unspecified: Secondary | ICD-10-CM

## 2013-01-18 DIAGNOSIS — Z9861 Coronary angioplasty status: Secondary | ICD-10-CM

## 2013-01-18 DIAGNOSIS — I5043 Acute on chronic combined systolic (congestive) and diastolic (congestive) heart failure: Secondary | ICD-10-CM

## 2013-01-18 DIAGNOSIS — Z85828 Personal history of other malignant neoplasm of skin: Secondary | ICD-10-CM

## 2013-01-18 DIAGNOSIS — I509 Heart failure, unspecified: Secondary | ICD-10-CM

## 2013-01-18 DIAGNOSIS — R400 Somnolence: Secondary | ICD-10-CM | POA: Diagnosis present

## 2013-01-18 DIAGNOSIS — I255 Ischemic cardiomyopathy: Secondary | ICD-10-CM

## 2013-01-18 DIAGNOSIS — Y95 Nosocomial condition: Secondary | ICD-10-CM | POA: Diagnosis present

## 2013-01-18 DIAGNOSIS — K759 Inflammatory liver disease, unspecified: Secondary | ICD-10-CM | POA: Diagnosis present

## 2013-01-18 DIAGNOSIS — E43 Unspecified severe protein-calorie malnutrition: Secondary | ICD-10-CM | POA: Diagnosis present

## 2013-01-18 DIAGNOSIS — E039 Hypothyroidism, unspecified: Secondary | ICD-10-CM

## 2013-01-18 DIAGNOSIS — R4182 Altered mental status, unspecified: Secondary | ICD-10-CM

## 2013-01-18 DIAGNOSIS — J810 Acute pulmonary edema: Secondary | ICD-10-CM

## 2013-01-18 DIAGNOSIS — Z85850 Personal history of malignant neoplasm of thyroid: Secondary | ICD-10-CM

## 2013-01-18 DIAGNOSIS — I480 Paroxysmal atrial fibrillation: Secondary | ICD-10-CM | POA: Diagnosis present

## 2013-01-18 DIAGNOSIS — I4949 Other premature depolarization: Secondary | ICD-10-CM | POA: Diagnosis present

## 2013-01-18 DIAGNOSIS — R1310 Dysphagia, unspecified: Secondary | ICD-10-CM | POA: Diagnosis present

## 2013-01-18 DIAGNOSIS — N183 Chronic kidney disease, stage 3 unspecified: Secondary | ICD-10-CM | POA: Diagnosis present

## 2013-01-18 DIAGNOSIS — Z515 Encounter for palliative care: Secondary | ICD-10-CM | POA: Diagnosis not present

## 2013-01-18 DIAGNOSIS — F17200 Nicotine dependence, unspecified, uncomplicated: Secondary | ICD-10-CM | POA: Diagnosis present

## 2013-01-18 LAB — CBC
MCV: 85.7 fL (ref 78.0–100.0)
Platelets: 261 10*3/uL (ref 150–400)
RBC: 4.05 MIL/uL — ABNORMAL LOW (ref 4.22–5.81)
RDW: 18.6 % — ABNORMAL HIGH (ref 11.5–15.5)
WBC: 11.5 10*3/uL — ABNORMAL HIGH (ref 4.0–10.5)

## 2013-01-18 LAB — COMPREHENSIVE METABOLIC PANEL
Albumin: 3.4 g/dL — ABNORMAL LOW (ref 3.5–5.2)
Alkaline Phosphatase: 92 U/L (ref 39–117)
BUN: 21 mg/dL (ref 6–23)
CO2: 39 mEq/L — ABNORMAL HIGH (ref 19–32)
Chloride: 87 mEq/L — ABNORMAL LOW (ref 96–112)
Creatinine, Ser: 1.45 mg/dL — ABNORMAL HIGH (ref 0.50–1.35)
GFR calc non Af Amer: 47 mL/min — ABNORMAL LOW (ref >90)
Glucose, Bld: 145 mg/dL — ABNORMAL HIGH (ref 70–99)
Potassium: 4.6 mEq/L (ref 3.5–5.1)
Total Bilirubin: 0.2 mg/dL — ABNORMAL LOW (ref 0.3–1.2)

## 2013-01-18 LAB — POCT I-STAT 3, ART BLOOD GAS (G3+)
Acid-Base Excess: 12 mmol/L — ABNORMAL HIGH (ref 0.0–2.0)
Bicarbonate: 44.3 mEq/L — ABNORMAL HIGH (ref 20.0–24.0)
O2 Saturation: 96 %
Patient temperature: 98.6
TCO2: 48 mmol/L (ref 0–100)
pO2, Arterial: 108 mmHg — ABNORMAL HIGH (ref 80.0–100.0)

## 2013-01-18 LAB — TROPONIN I: Troponin I: <0.3 ng/mL (ref <0.30)

## 2013-01-18 LAB — PROTIME-INR: INR: 1.3 (ref 0.00–1.49)

## 2013-01-18 MED ORDER — ALBUTEROL SULFATE (5 MG/ML) 0.5% IN NEBU: 2.5000 mg | INHALATION_SOLUTION | RESPIRATORY_TRACT | Status: DC | PRN

## 2013-01-18 MED ORDER — DEXTROSE 5 % IV SOLN
2.0000 g | Freq: Once | INTRAVENOUS | Status: AC
  Administered 2013-01-18: 2 g via INTRAVENOUS
  Filled 2013-01-18: qty 2

## 2013-01-18 MED ORDER — ALBUTEROL (5 MG/ML) CONTINUOUS INHALATION SOLN
10.0000 mg/h | INHALATION_SOLUTION | Freq: Once | RESPIRATORY_TRACT | Status: AC
  Administered 2013-01-18: 10 mg/h via RESPIRATORY_TRACT
  Filled 2013-01-18: qty 20

## 2013-01-18 MED ORDER — ALBUTEROL SULFATE (5 MG/ML) 0.5% IN NEBU
5.0000 mg | INHALATION_SOLUTION | Freq: Once | RESPIRATORY_TRACT | Status: AC
  Administered 2013-01-18: 5 mg via RESPIRATORY_TRACT
  Filled 2013-01-18: qty 1

## 2013-01-18 MED ORDER — METOPROLOL TARTRATE 1 MG/ML IV SOLN
5.0000 mg | Freq: Four times a day (QID) | INTRAVENOUS | Status: DC
  Administered 2013-01-18 – 2013-01-19 (×4): 5 mg via INTRAVENOUS
  Filled 2013-01-18 (×7): qty 5

## 2013-01-18 MED ORDER — ALBUTEROL SULFATE (5 MG/ML) 0.5% IN NEBU
2.5000 mg | INHALATION_SOLUTION | Freq: Four times a day (QID) | RESPIRATORY_TRACT | Status: DC
  Administered 2013-01-18 – 2013-01-26 (×27): 2.5 mg via RESPIRATORY_TRACT
  Filled 2013-01-18 (×29): qty 0.5

## 2013-01-18 MED ORDER — METHYLPREDNISOLONE SODIUM SUCC 125 MG IJ SOLR
125.0000 mg | Freq: Once | INTRAMUSCULAR | Status: AC
  Administered 2013-01-18: 125 mg via INTRAVENOUS
  Filled 2013-01-18: qty 2

## 2013-01-18 MED ORDER — BUDESONIDE 0.25 MG/2ML IN SUSP
0.2500 mg | Freq: Four times a day (QID) | RESPIRATORY_TRACT | Status: DC
  Administered 2013-01-18 – 2013-01-26 (×27): 0.25 mg via RESPIRATORY_TRACT
  Filled 2013-01-18 (×41): qty 2

## 2013-01-18 MED ORDER — IPRATROPIUM BROMIDE 0.02 % IN SOLN
0.5000 mg | Freq: Four times a day (QID) | RESPIRATORY_TRACT | Status: DC
  Administered 2013-01-18 – 2013-01-19 (×3): 0.5 mg via RESPIRATORY_TRACT
  Filled 2013-01-18 (×4): qty 2.5

## 2013-01-18 MED ORDER — FUROSEMIDE 10 MG/ML IJ SOLN
40.0000 mg | Freq: Two times a day (BID) | INTRAMUSCULAR | Status: AC
  Administered 2013-01-18 – 2013-01-19 (×2): 40 mg via INTRAVENOUS
  Filled 2013-01-18 (×2): qty 4

## 2013-01-18 MED ORDER — LEVOTHYROXINE SODIUM 100 MCG IV SOLR
60.0000 ug | Freq: Every day | INTRAVENOUS | Status: DC
  Administered 2013-01-18 – 2013-01-19 (×2): 60 ug via INTRAVENOUS
  Filled 2013-01-18 (×2): qty 5

## 2013-01-18 MED ORDER — ALBUTEROL SULFATE (5 MG/ML) 0.5% IN NEBU
2.5000 mg | INHALATION_SOLUTION | RESPIRATORY_TRACT | Status: DC | PRN
  Administered 2013-01-20: 2.5 mg via RESPIRATORY_TRACT

## 2013-01-18 MED ORDER — MORPHINE SULFATE 2 MG/ML IJ SOLN
2.0000 mg | INTRAMUSCULAR | Status: DC | PRN
  Administered 2013-01-19 – 2013-01-25 (×6): 2 mg via INTRAVENOUS
  Filled 2013-01-18 (×7): qty 1

## 2013-01-18 MED ORDER — IPRATROPIUM BROMIDE 0.02 % IN SOLN
0.5000 mg | Freq: Once | RESPIRATORY_TRACT | Status: AC
  Administered 2013-01-18: 0.5 mg via RESPIRATORY_TRACT
  Filled 2013-01-18: qty 2.5

## 2013-01-18 MED ORDER — HEPARIN SODIUM (PORCINE) 5000 UNIT/ML IJ SOLN
5000.0000 [IU] | Freq: Three times a day (TID) | INTRAMUSCULAR | Status: DC
  Administered 2013-01-18 – 2013-01-19 (×2): 5000 [IU] via SUBCUTANEOUS
  Filled 2013-01-18 (×5): qty 1

## 2013-01-18 MED ORDER — DEXTROSE-NACL 5-0.45 % IV SOLN
INTRAVENOUS | Status: DC
  Administered 2013-01-18: 15:00:00 via INTRAVENOUS

## 2013-01-18 MED ORDER — SODIUM CHLORIDE 0.9 % IV SOLN
INTRAVENOUS | Status: DC
  Administered 2013-01-18: 10 mL via INTRAVENOUS

## 2013-01-18 MED ORDER — DEXTROSE 5 % IV SOLN: 1.0000 g | INTRAVENOUS | Status: DC

## 2013-01-18 MED ORDER — METHYLPREDNISOLONE SODIUM SUCC 40 MG IJ SOLR
40.0000 mg | Freq: Two times a day (BID) | INTRAMUSCULAR | Status: DC
  Administered 2013-01-18 – 2013-01-20 (×4): 40 mg via INTRAVENOUS
  Filled 2013-01-18 (×6): qty 1

## 2013-01-18 MED ORDER — DEXTROSE 5 % IV SOLN
1.0000 g | Freq: Two times a day (BID) | INTRAVENOUS | Status: DC
  Administered 2013-01-19 – 2013-01-24 (×11): 1 g via INTRAVENOUS
  Filled 2013-01-18 (×13): qty 1

## 2013-01-18 NOTE — ED Notes (Signed)
Per ED MD pt is a dnr

## 2013-01-18 NOTE — ED Notes (Signed)
Pt was found unresponsive this morning by granddaughter on bedroom floor. Use accessory muscles. Pt recently diagnosed with thyroid cancer and scheduled to start treatment in 2 weeks.

## 2013-01-18 NOTE — ED Notes (Signed)
resp called to bedside at this time r/t spo2

## 2013-01-18 NOTE — Progress Notes (Signed)
Utilization review completed.  

## 2013-01-18 NOTE — ED Notes (Signed)
at bedside. Admitting np

## 2013-01-18 NOTE — ED Notes (Signed)
ED MD made aware of trigemeny

## 2013-01-18 NOTE — ED Notes (Signed)
MD at bedside. 

## 2013-01-18 NOTE — ED Provider Notes (Signed)
CSN: [id number]     Arrival date & time 01/18/13  1113 History     First MD Initiated Contact with Patient 01/18/13 1117     Chief Complaint  Patient presents with  . Altered Mental Status  . Respiratory Distress   (Consider location/radiation/quality/duration/timing/severity/associated sxs/prior Treatment) Patient is a 72 y.o. male presenting with altered mental status. The history is provided by the patient and the EMS personnel. The history is limited by the condition of the patient.  Altered Mental Status pt with chronic resp failure, copd, recent dx cancer w recent progressive dyspnea. Pt found unresponsive by family this morning, obtunded.  Pt unresponsive verbally - level 5 caveat.     Past Medical History  Diagnosis Date  . Hypertension   . Hyperlipidemia   . Peripheral vascular disease     Hx of Ax-fem BPG, known LCCA disease  . CHF (congestive heart failure)     Ischemic cardiomyopathy EF 30 - 35%  . Cancer     skin cancer, Thyroid cancer  . Stroke 2004    minor  . Dysrhythmia     PAF  . COPD (chronic obstructive pulmonary disease)     PT DENIES USES O2 2 LITERS HS  . Heart murmur     Mild-moderate MR  . Hepatitis     AT AGE  71; unknown type    . Heart attack     03/17/12; subtotally occluded RCI with unsucessful PCI  . Coronary artery disease     Cardiologist is Dr. Nanetta Batty   . Thyroid cancer     Papillary  . Renal insufficiency   . Somnolence, daytime, secondary to elevated CO2 01/16/2013   Past Surgical History  Procedure Laterality Date  . Bypass graft  07/2010    axillobifemoral BPG  . Spine surgery  11/23/06    microdiscectomy L5-S1  . Hemorrhoid surgery    . Tonsillectomy    . Hernia repair  02/09/11    [facility]  . Cardiac catheterization      2013  . Coronary angioplasty      2013  . Thyroidectomy Bilateral 10/07/2012    Procedure: THYROIDECTOMY;  Surgeon: Darletta Moll, MD;  Location: [facility] OR;  Service: ENT;  Laterality: Bilateral;  .  Radical neck dissection Left 10/07/2012    Procedure: RADICAL NECK DISSECTION;  Surgeon: Darletta Moll, MD;  Location: [facility] OR;  Service: ENT;  Laterality: Left;   Family History  Problem Relation Age of Onset  . Alzheimer's disease Mother   . Heart failure Mother   . Heart disease Mother   . Cancer Father     prostate  . Heart disease Father   . Heart attack Father   . Cancer Brother     prostate  . Stroke Brother    History  Substance Use Topics  . Smoking status: Former Smoker -- 1.00 packs/day for 50 years    Types: Cigarettes    Quit date: 12/29/2012  . Smokeless tobacco: Never Used     Comment: Smoking 4 -5 per day. now smoking electronic cigs  . Alcohol Use: 4.2 oz/week    7 Cans of beer per week     Comment: 1-2 drinks every few days    Review of Systems  Unable to perform ROS: Patient unresponsive  Psychiatric/Behavioral: Positive for altered mental status.  level 5 caveat  Allergies  Ceclor and Morphine and related  Home Medications   Current Outpatient Rx  Name  Route  Sig  Dispense  Refill  . albuterol (PROVENTIL) (5 MG/ML) 0.5% nebulizer solution   Nebulization   Take 0.5 mLs (2.5 mg total) by nebulization every 3 (three) hours as needed for wheezing or shortness of breath.   20 mL   12   . apixaban (ELIQUIS) 2.5 MG TABS tablet   Oral   Take 1 tablet (2.5 mg total) by mouth 2 (two) times daily.   60 tablet   0   . bisacodyl (DULCOLAX) 5 MG EC tablet   Oral   Take 2 tablets (10 mg total) by mouth daily as needed.   30 tablet   0   . budesonide (PULMICORT) 0.25 MG/2ML nebulizer solution   Nebulization   Take 2 mLs (0.25 mg total) by nebulization every 6 (six) hours.   60 mL   12   . calcitRIOL (ROCALTROL) 0.5 MCG capsule   Oral   Take 0.5 mcg by mouth daily.         . calcium carbonate (OS-CAL - DOSED IN MG OF ELEMENTAL CALCIUM) 1250 MG tablet   Oral   Take 1 tablet (500 mg of elemental calcium total) by mouth 4 (four) times daily.   120  tablet   5   . digoxin 62.5 MCG TABS   Oral   Take 0.0625 mg by mouth daily.   90 tablet   0   . furosemide (LASIX) 40 MG tablet   Oral   Take 1 tablet (40 mg total) by mouth 2 (two) times daily.   60 tablet   5   . gabapentin (NEURONTIN) 600 MG tablet   Oral   Take 600 mg by mouth 2 (two) times daily.          . hydrALAZINE (APRESOLINE) 25 MG tablet   Oral   Take 3 tablets (75 mg total) by mouth 2 (two) times daily.   180 tablet   5   . isosorbide mononitrate (IMDUR) 30 MG 24 hr tablet   Oral   Take 1 tablet (30 mg total) by mouth daily.   30 tablet   6   . levothyroxine (SYNTHROID, LEVOTHROID) 125 MCG tablet   Oral   Take 125 mcg by mouth daily before breakfast.         . Maltodextrin-Xanthan Gum (RESOURCE THICKENUP CLEAR) POWD      As directed on the packaging.         . metoprolol succinate (TOPROL-XL) 25 MG 24 hr tablet   Oral   Take 25 mg by mouth daily.         . nitroGLYCERIN (NITROSTAT) 0.4 MG SL tablet   Sublingual   Place 1 tablet (0.4 mg total) under the tongue every 5 (five) minutes x 3 doses as needed for chest pain.   25 tablet   4   . oxyCODONE-acetaminophen (PERCOCET/ROXICET) 5-325 MG per tablet   Oral   Take 1-2 tablets by mouth every 4 (four) hours as needed for pain.   40 tablet   0   . pantoprazole (PROTONIX) 40 MG tablet   Oral   Take 1 tablet (40 mg total) by mouth daily at 12 noon.   30 tablet   2   . potassium chloride (K-DUR,KLOR-CON) 10 MEQ tablet   Oral   Take 10 mEq by mouth daily.         Marland Kitchen senna (SENOKOT) 8.6 MG TABS   Oral   Take 1 tablet (8.6 mg total) by mouth at  bedtime.   120 each   0   . simvastatin (ZOCOR) 20 MG tablet   Oral   Take 1 tablet (20 mg total) by mouth daily at 6 PM.   90 tablet   0   . tiotropium (SPIRIVA) 18 MCG inhalation capsule   Inhalation   Place 1 capsule (18 mcg total) into inhaler and inhale daily.   30 capsule   0    There were no vitals taken for this  visit. Physical Exam  Nursing note and vitals reviewed. Constitutional: He appears distressed.  Obtunded, resp distress.   HENT:  Head: Atraumatic.  Mouth/Throat: Oropharynx is clear and moist.  Eyes: Pupils are equal, round, and reactive to light.  Neck: Neck supple. No tracheal deviation present.  Cardiovascular: Normal rate, regular rhythm, normal heart sounds and intact distal pulses.   Pulmonary/Chest: No accessory muscle usage. He is in respiratory distress.  Dec bs bil. Wheezing.   Abdominal: Soft. He exhibits no distension. There is no tenderness.  Musculoskeletal: Normal range of motion. He exhibits no edema and no tenderness.  Neurological:  Pt breathing on own. Obtunded.   Skin: Skin is warm and dry. No rash noted.  Psychiatric:  Unresponsive.     ED Course   Procedures (including critical care time)  Results for orders placed during the hospital encounter of 01/18/13  MRSA PCR SCREENING      Result Value Range   MRSA by PCR POSITIVE (*) NEGATIVE  COMPREHENSIVE METABOLIC PANEL      Result Value Range   Sodium 133 (*) 135 - 145 mEq/L   Potassium 4.6  3.5 - 5.1 mEq/L   Chloride 87 (*) 96 - 112 mEq/L   CO2 39 (*) 19 - 32 mEq/L   Glucose, Bld 145 (*) 70 - 99 mg/dL   BUN 21  6 - 23 mg/dL   Creatinine, Ser 1.61 (*) 0.50 - 1.35 mg/dL   Calcium 09.6  8.4 - 04.5 mg/dL   Total Protein 7.3  6.0 - 8.3 g/dL   Albumin 3.4 (*) 3.5 - 5.2 g/dL   AST 36  0 - 37 U/L   ALT 32  0 - 53 U/L   Alkaline Phosphatase 92  39 - 117 U/L   Total Bilirubin 0.2 (*) 0.3 - 1.2 mg/dL   GFR calc non Af Amer 47 (*) >90 mL/min   GFR calc Af Amer 54 (*) >90 mL/min  CBC      Result Value Range   WBC 11.5 (*) 4.0 - 10.5 K/uL   RBC 4.05 (*) 4.22 - 5.81 MIL/uL   Hemoglobin 10.4 (*) 13.0 - 17.0 g/dL   HCT 40.9 (*) 81.1 - 91.4 %   MCV 85.7  78.0 - 100.0 fL   MCH 25.7 (*) 26.0 - 34.0 pg   MCHC 30.0  30.0 - 36.0 g/dL   RDW 78.2 (*) 95.6 - 21.3 %   Platelets 261  150 - 400 K/uL  TROPONIN I       Result Value Range   Troponin I <0.30  <0.30 ng/mL  PROTIME-INR      Result Value Range   Prothrombin Time 15.9 (*) 11.6 - 15.2 seconds   INR 1.30  0.00 - 1.49  PRO B NATRIURETIC PEPTIDE      Result Value Range   Pro B Natriuretic peptide (BNP) 17087.0 (*) 0 - 125 pg/mL  URINALYSIS, ROUTINE W REFLEX MICROSCOPIC      Result Value Range   Color, Urine  YELLOW  YELLOW   APPearance CLEAR  CLEAR   Specific Gravity, Urine 1.015  1.005 - 1.030   pH 6.0  5.0 - 8.0   Glucose, UA NEGATIVE  NEGATIVE mg/dL   Hgb urine dipstick NEGATIVE  NEGATIVE   Bilirubin Urine NEGATIVE  NEGATIVE   Ketones, ur NEGATIVE  NEGATIVE mg/dL   Protein, ur NEGATIVE  NEGATIVE mg/dL   Urobilinogen, UA 0.2  0.0 - 1.0 mg/dL   Nitrite NEGATIVE  NEGATIVE   Leukocytes, UA NEGATIVE  NEGATIVE  BASIC METABOLIC PANEL      Result Value Range   Sodium 137  135 - 145 mEq/L   Potassium 3.5  3.5 - 5.1 mEq/L   Chloride 89 (*) 96 - 112 mEq/L   CO2 39 (*) 19 - 32 mEq/L   Glucose, Bld 147 (*) 70 - 99 mg/dL   BUN 20  6 - 23 mg/dL   Creatinine, Ser 1.61  0.50 - 1.35 mg/dL   Calcium 9.0  8.4 - 09.6 mg/dL   GFR calc non Af Amer 54 (*) >90 mL/min   GFR calc Af Amer 63 (*) >90 mL/min  MAGNESIUM      Result Value Range   Magnesium 1.7  1.5 - 2.5 mg/dL  BASIC METABOLIC PANEL      Result Value Range   Sodium 136  135 - 145 mEq/L   Potassium 3.6  3.5 - 5.1 mEq/L   Chloride 88 (*) 96 - 112 mEq/L   CO2 38 (*) 19 - 32 mEq/L   Glucose, Bld 133 (*) 70 - 99 mg/dL   BUN 32 (*) 6 - 23 mg/dL   Creatinine, Ser 0.45 (*) 0.50 - 1.35 mg/dL   Calcium 8.4  8.4 - 40.9 mg/dL   GFR calc non Af Amer 50 (*) >90 mL/min   GFR calc Af Amer 58 (*) >90 mL/min  CBC WITH DIFFERENTIAL      Result Value Range   WBC 15.0 (*) 4.0 - 10.5 K/uL   RBC 3.72 (*) 4.22 - 5.81 MIL/uL   Hemoglobin 9.5 (*) 13.0 - 17.0 g/dL   HCT 81.1 (*) 91.4 - 78.2 %   MCV 79.8  78.0 - 100.0 fL   MCH 25.5 (*) 26.0 - 34.0 pg   MCHC 32.0  30.0 - 36.0 g/dL   RDW 95.6 (*) 21.3 - 08.6  %   Platelets 251  150 - 400 K/uL   Neutrophils Relative % 94 (*) 43 - 77 %   Neutro Abs 14.1 (*) 1.7 - 7.7 K/uL   Lymphocytes Relative 4 (*) 12 - 46 %   Lymphs Abs 0.6 (*) 0.7 - 4.0 K/uL   Monocytes Relative 2 (*) 3 - 12 %   Monocytes Absolute 0.3  0.1 - 1.0 K/uL   Eosinophils Relative 0  0 - 5 %   Eosinophils Absolute 0.0  0.0 - 0.7 K/uL   Basophils Relative 0  0 - 1 %   Basophils Absolute 0.0  0.0 - 0.1 K/uL  BASIC METABOLIC PANEL      Result Value Range   Sodium 138  135 - 145 mEq/L   Potassium 3.0 (*) 3.5 - 5.1 mEq/L   Chloride 87 (*) 96 - 112 mEq/L   CO2 40 (*) 19 - 32 mEq/L   Glucose, Bld 98  70 - 99 mg/dL   BUN 32 (*) 6 - 23 mg/dL   Creatinine, Ser 5.78 (*) 0.50 - 1.35 mg/dL   Calcium 7.7 (*) 8.4 -  10.5 mg/dL   GFR calc non Af Amer 50 (*) >90 mL/min   GFR calc Af Amer 58 (*) >90 mL/min  CBC      Result Value Range   WBC 13.7 (*) 4.0 - 10.5 K/uL   RBC 3.40 (*) 4.22 - 5.81 MIL/uL   Hemoglobin 8.6 (*) 13.0 - 17.0 g/dL   HCT 45.4 (*) 09.8 - 11.9 %   MCV 79.4  78.0 - 100.0 fL   MCH 25.3 (*) 26.0 - 34.0 pg   MCHC 31.9  30.0 - 36.0 g/dL   RDW 14.7 (*) 82.9 - 56.2 %   Platelets 229  150 - 400 K/uL  PRO B NATRIURETIC PEPTIDE      Result Value Range   Pro B Natriuretic peptide (BNP) 25393.0 (*) 0 - 125 pg/mL  POCT I-STAT 3, BLOOD GAS (G3+)      Result Value Range   pH, Arterial 7.207 (*) 7.350 - 7.450   pCO2 arterial 111.4 (*) 35.0 - 45.0 mmHg   pO2, Arterial 108.0 (*) 80.0 - 100.0 mmHg   Bicarbonate 44.3 (*) 20.0 - 24.0 mEq/L   TCO2 48  0 - 100 mmol/L   O2 Saturation 96.0     Acid-Base Excess 12.0 (*) 0.0 - 2.0 mmol/L   Patient temperature 98.6 F     Collection site RADIAL, ALLEN'S TEST ACCEPTABLE     Drawn by Operator     Sample type ARTERIAL     Comment NOTIFIED PHYSICIAN     Dg Chest 2 View  12/31/2012   *RADIOLOGY REPORT*  Clinical Data: Shortness of breath and findings suggestive of left lower lobe infiltrate by prior chest x-ray.  CHEST - 2 VIEW  Comparison:  12/29/2012  Findings: There is suggestion of left lower lobe infiltrate with associated small left pleural effusion.  Underlying significant COPD/chronic lung disease again noted.  The heart size is within normal limits.  Stable osteopenia of the thoracic spine with several compression fractures identified.  IMPRESSION: Persistent suggestion of left lower lobe infiltrate with associated small left pleural effusion.   Original Report Authenticated By: Irish Lack, M.D.   Dg Chest 2 View  12/29/2012   *RADIOLOGY REPORT*  Clinical Data: Cough, congestion, wheezing.  Thyroid cancer.  CHEST - 2 VIEW  Comparison: 12/07/2012.  Findings: Chronic cardiopericardial enlargement.  The mediastinal contours are distorted by rightward rotation.  There is extensive aortic calcification.  Abnormal left lower lobe aeration, with elevation of the diaphragm. Morphology of the lower lung may represent subpulmonic pleural fluid.  Chronic coarsening of interstitial markings, likely chronic lung disease rather than edema.  Small pulmonary nodules noted on previous PET-CT not clearly seen today. Right third, fourth, fifth, seventh, and eighth rib fractures, present on previous PET imaging.  IMPRESSION: 1.  Abnormal aeration at the left base, suspicious for pneumonia with small pleural effusion. 2. Numerous nonacute, but potentially unhealed upper right rib fractures.   Original Report Authenticated By: Tiburcio Pea   Mr [facility] Wo Contrast  01/02/2013   *RADIOLOGY REPORT*  Clinical Data: Altered mental status, atrial fibrillation, hypertension, and hyperlipidemia.  Right occipital CVA.  MRA HEAD WITHOUT CONTRAST  Technique: Angiographic images of the Circle of Willis were obtained using MRA technique without intravenous contrast.  Comparison: 01/01/2013 MRI.  Findings: There is absent flow related enhancement in the upper cervical, petrous, cavernous internal carotid artery on the left. There is some reconstitution of the upper  cavernous and supraclinoid internal carotid artery perhaps collateral flow  from the left ophthalmic and/or retrograde flow via cross fill.  There is a small rounded structure in the expected region of the posterior communicating artery origin on the left.  This measures up to 3 mm and could represent a small berry aneurysm or infundibulum.  The right internal carotid artery shows wide patency in its upper cervical and petrous segments.  There is mild nonstenotic irregularity in the inferior RICA cavernous segment.  The supraclinoid ICA on the right is mildly irregular.  The basilar artery is patent with the right vertebral the dominant contributor.  The left vertebral does contribute to the basilar. There is smooth irregularity of the distal right vertebral without focal stenosis.  Focal irregularity of the distal left vertebral could represent a 50-75% stenosis (arrow).  Mid basilar stenosis between the superior cerebellar arteries and the anterior inferior cerebellar arteries approaches 50%.  The right anterior and middle cerebral arteries are widely patent proximally.  There is mild irregularity of the proximal left ACA and MCA. Diminished relative flow related enhancement left anterior circulation reflects carotid occlusion.  50% stenosis ambient segment right PCA.  Patent but disease bilateral superior cerebellar arteries.  Poorly visualized PICAs and AICAs. Moderate irregularity distal MCA branches bilaterally.  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  Intracranial atherosclerotic change as described.   Original Report Authenticated By: Davonna Belling, M.D.   Mr Laqueta Jean Wo Contrast  01/01/2013   *RADIOLOGY REPORT*  Clinical Data: Altered mental status.  History of high blood pressure and hyperlipidemia.  Thyroid cancer.  MRI HEAD WITHOUT AND WITH CONTRAST  Technique:   Multiplanar, multiecho pulse sequences of the brain and surrounding structures were obtained according to standard protocol without and with intravenous contrast  Contrast: 10mL MULTIHANCE GADOBENATE DIMEGLUMINE 529 MG/ML IV SOLN  Comparison: 11/26/2003 head CT.  No comparison brain MR.  Findings: Motion degraded exam.   Tiny acute right occipital lobe infarct.  Remote infarct with blood stained encephalomalacia left caudate head and subsequent mild dilation of the adjacent left lateral ventricle.  Remote small thalamic infarcts larger on the left.  Moderate small vessel disease type changes.  Atrophy with ventricular prominence felt to be related to atrophy rather hydrocephalus.  Left internal carotid artery appears to be occluded or significantly narrowed with reconstitution of flow supraclinoid region.  Transverse ligament hypertrophy.  Cervical medullary junction unremarkable.  Partial empty sella incidentally noted.  No intracranial mass or abnormal enhancement noted on the significantly motion degraded images.  IMPRESSION: Motion degraded exam.  Tiny acute right occipital lobe infarct.  Remote infarct with blood stained encephalomalacia left caudate head and subsequent mild dilation of the adjacent left lateral ventricle.  Remote small thalamic infarcts larger on the left.  Moderate small vessel disease type changes.  Left internal carotid artery appears to be occluded or significantly narrowed with reconstitution of flow supraclinoid region.  Critical Value/emergent results were called by telephone at the time of interpretation on 01/01/2013 at 2:30 p.m. to [facility] patient's nurse, who verbally acknowledged these results.   Original Report Authenticated By: Lacy Duverney, M.D.   Nm Pulmonary Perf And Vent  12/30/2012   *RADIOLOGY REPORT*  Clinical Data:  Shortness of breath with wheezing.  Papillary thyroid cancer.  Question pulmonary embolism.  NUCLEAR MEDICINE VENTILATION - PERFUSION LUNG SCAN  Technique:   Ventilation images were obtained in multiple projections using  inhaled aerosol technetium 99 M DTPA.  Perfusion images were obtained in multiple projections after intravenous injection of Tc-51m MAA.  Radiopharmaceuticals:  Tc-30m DTPA aerosol and 6.0 mCi Tc-31m MAA.  Comparison: Chest radiographs 12/29/2012.  PET CT 12/15/2012.  Findings:  Ventilation:  There is prominent central clumping of the aerosol within the trachea and proximal bronchi in asymmetric to the left. There are patchy ventilatory defects in both lungs suggesting underlying obstructive lung disease.  Free pertechnetate is noted within the stomach.  Perfusion:  The perfusion appears matched to the ventilatory examination.  There are no perfusion defects to suggest pulmonary embolism.  IMPRESSION:  1.  Low probability for acute pulmonary embolism. 2.  Central aerosol clumping and patchy ventilation suggesting obstructive lung disease.   Original Report Authenticated By: Carey Bullocks, M.D.   Dg Chest Port 1 View  01/21/2013   *RADIOLOGY REPORT*  Clinical Data: Productive cough.  Follow up pneumonia.  PORTABLE CHEST - 1 VIEW  Comparison: Portable chest x-ray 01/18/2013.  Two-view chest x-ray 12/31/2012.  Findings: Cardiac silhouette moderately enlarged but stable.  Mild chronic interstitial pulmonary edema, essentially unchanged since the 07/12 examination.  Patchy airspace opacity in the right upper lobe not significantly changed since yesterday.  Stable chronic small bilateral pleural effusions, left greater than right.  Stable passive atelectasis in the left lower lobe.  No new pulmonary parenchymal abnormalities.  IMPRESSION: Stable mild patchy pneumonia in the right upper lobe superimposed upon chronic mild interstitial pulmonary edema.  Stable small bilateral pleural effusions, left greater than right, and associated passive atelectasis in the left lower lobe.   Original Report Authenticated By: Hulan Saas, M.D.   Dg Chest Port 1  View  01/18/2013   *RADIOLOGY REPORT*  Clinical Data: Shortness of breath  PORTABLE CHEST - 1 VIEW  Comparison: December 31, 2012.  Findings: Mild cardiomegaly is noted.  Right rib fractures are again noted and unchanged.  No pneumothorax is noted.  Central pulmonary vascular congestion is again noted.  Left pleural effusion noted on prior exam is significantly smaller.  Left basilar opacity is noted which may represent subsegmental atelectasis or pneumonia.  Increased opacity is seen in right upper lobe which may represent developing infiltrate.  IMPRESSION: Possible developing right upper lobe infiltrate.  Right rib fractures are again noted.  Left basilar opacity is noted consistent with pneumonia or atelectasis.  Left pleural effusion noted on prior exam is significantly improved.   Original Report Authenticated By: Lupita Raider.,  M.D.      MDM  Iv ns. Monitor.   Pt records, including from recent admission, indicate dnr - as such, will support w neb txs, bipap, supportive care.  When family arrives, dnr wishes, no intubate/cpr confirmed w family.  Labs. Cxr.  Reviewed nursing notes and prior charts for additional history.    Date: 01/18/2013  Rate: 71  Rhythm: normal sinus rhythm and premature ventricular contractions (PVC)  QRS Axis: normal  Intervals: normal  ST/T Wave abnormalities: nonspecific ST/T changes  Conduction Disutrbances:none  Narrative Interpretation:   Old EKG Reviewed: changes noted  CRITICAL CARE  Re acute resp failure, hypercarbia, altered mental status, resp distress Performed by: Suzi Roots Total critical care time: 45 Critical care time was exclusive of separately billable procedures and treating other patients. Critical care was necessary to treat or prevent imminent or life-threatening deterioration. Critical care was time spent personally by me on the following activities: development of treatment plan with patient and/or surrogate as well as  nursing,  discussions with consultants, evaluation of patient's response to treatment, examination of patient, obtaining history from patient or surrogate, ordering and performing treatments and interventions, ordering and review of laboratory studies, ordering and review of radiographic studies, pulse oximetry and re-evaluation of patient's condition.   resp therapy worked with pt, bipap, multiple albuterol and atrovent nebs, iv steroids.  On initial abg pt w marked resp acidosis.   Pts mental status v slowly improved w txs/bipap.   icu team/critical care called, evaluated in ed -will admit.   Suzi Roots, MD 01/22/13 0800

## 2013-01-18 NOTE — Progress Notes (Signed)
Pt. came into ED for respiratory distress after being found unresponsive by grand- daughter. Escorted pt. sister to bedside. Provided emotional support and encouraging word to sister. Facilitated information sharing between staff and family. Will follow as needed.   01/18/13 1100  Clinical Encounter Type  Visited With Patient and family together;Health care provider  Visit Type Spiritual support;ED;Trauma  Referral From Nurse  Spiritual Encounters  Spiritual Needs Prayer;Emotional  Stress Factors  Family Stress Factors None identified  Reona Zendejas, Chaplain,[phone]

## 2013-01-18 NOTE — ED Notes (Signed)
Granddaughter now at bedside states that pt was last seen last night at 2100 -2200. Granddaughter states that she attempted to wake pt up at 1000 this morning and he was unresponsive. Per granddaughter pt had nasal cannula  "Half in half out of nose" Granddaughter states that she placed pt onto floor as instructed by 911 dispatch. Granddaughter denies any falls.

## 2013-01-18 NOTE — Progress Notes (Signed)
ANTIBIOTIC CONSULT NOTE - INITIAL  Pharmacy Consult for Ceftazidime Indication: pneumonia  Allergies  Allergen Reactions  . Ceclor (Cefaclor) Anaphylaxis    Recently took pcn for tooth problems, without issues  . Morphine And Related Other (See Comments)    Abnormal behavior    Patient Measurements: Height: 5\' 8"  (172.7 cm) Weight: 125 lb (56.7 kg) IBW/kg (Calculated) : 68.4 Adjusted Body Weight:   Vital Signs: Temp: 98 F (36.7 C) (07/30 1700) Temp src: Oral (07/30 1700) BP: 156/80 mmHg (07/30 1704) Pulse Rate: 81 (07/30 1706) Intake/Output from previous day:   Intake/Output from this shift:    Labs:  Recent Labs  01/18/13 1239  WBC 11.5*  HGB 10.4*  PLT 261  CREATININE 1.45*   Estimated Creatinine Clearance: 37.5 ml/min (by C-G formula based on Cr of 1.45). No results found for this basename: VANCOTROUGH, VANCOPEAK, VANCORANDOM, GENTTROUGH, GENTPEAK, GENTRANDOM, TOBRATROUGH, TOBRAPEAK, TOBRARND, AMIKACINPEAK, AMIKACINTROU, AMIKACIN,  in the last 72 hours   Microbiology: Recent Results (from the past 720 hour(s))  CULTURE, BLOOD (ROUTINE X 2)     Status: None   Collection Time    12/29/12  3:57 PM      Result Value Range Status   Specimen Description BLOOD HAND RIGHT   Final   Special Requests BOTTLES DRAWN AEROBIC ONLY 2.5CC   Final   Culture  Setup Time 12/29/2012 23:13   Final   Culture NO GROWTH 5 DAYS   Final   Report Status 01/04/2013 FINAL   Final  CULTURE, BLOOD (ROUTINE X 2)     Status: None   Collection Time    12/29/12  4:03 PM      Result Value Range Status   Specimen Description BLOOD ARM LEFT   Final   Special Requests BOTTLES DRAWN AEROBIC ONLY 10CC   Final   Culture  Setup Time 12/29/2012 23:13   Final   Culture NO GROWTH 5 DAYS   Final   Report Status 01/04/2013 FINAL   Final    Medical History: Past Medical History  Diagnosis Date  . Hypertension   . Hyperlipidemia   . Peripheral vascular disease     Hx of Ax-fem BPG, known  LCCA disease  . CHF (congestive heart failure)     Ischemic cardiomyopathy EF 30 - 35%  . Cancer     skin cancer, Thyroid cancer  . Stroke 2004    minor  . Dysrhythmia     PAF  . COPD (chronic obstructive pulmonary disease)     PT DENIES USES O2 2 LITERS HS  . Heart murmur     Mild-moderate MR  . Hepatitis     AT AGE  36; unknown type    . Heart attack     03/17/12; subtotally occluded RCI with unsucessful PCI  . Coronary artery disease     Cardiologist is Dr. Nanetta Batty   . Thyroid cancer     Papillary  . Renal insufficiency   . Somnolence, daytime, secondary to elevated CO2 01/16/2013    Medications:  Scheduled:  . albuterol  2.5 mg Nebulization Q6H  . budesonide  0.25 mg Nebulization Q6H  . [START ON 01/19/2013] cefTAZidime (FORTAZ)  IV  1 g Intravenous Q12H  . furosemide  40 mg Intravenous Q12H  . heparin  5,000 Units Subcutaneous Q8H  . ipratropium  0.5 mg Nebulization Q6H  . levothyroxine  60 mcg Intravenous Daily  . methylPREDNISolone (SOLU-MEDROL) injection  40 mg Intravenous Q12H  . metoprolol  5 mg Intravenous Q6H   Assessment: 72 yr old male found unresponsive by granddaughter. He was recently worked up for a R occipital lobe infarct. Pt has endstage COPD, CAD, PVD, HTN, and HLD. Now thought to have nosocomial pneumonia. Rx to dose ceftazidime. CrCl 37.5 ml/min.  Pt was released from the hospital a couple of days ag   Plan:  Ceftazidime 2 Gm IV q12 hrs.   Eugene Garnet 01/18/2013,7:34 PM

## 2013-01-18 NOTE — H&P (Signed)
PULMONARY  / CRITICAL CARE MEDICINE  Name: [name] [name] MRN: [id number] DOB: September 28, 1940    ADMISSION DATE:  01/18/2013  REFERRING MD :  Dr. Denton Lank - EDP PRIMARY SERVICE: PCCM  CHIEF COMPLAINT:  AMS, Acute Resp Fx  BRIEF PATIENT DESCRIPTION: 72 y/o M, complex medical hx, who presented to [facility] ER 7/30 with AMS, hypercarbic respiratory fx.  PCCM called for admit.    SIGNIFICANT EVENTS / STUDIES:  7/10-7/19:  Admit for small R occipital lobe infarct, CHF, COPD ........................................................................................................ 7/30 - Admit with Hypercarbic Resp Fx   LINES / TUBES:   CULTURES:   ANTIBIOTICS: Rocephin (empiric asp) 7/30>>>  HISTORY OF PRESENT ILLNESS:  72 y/o M, continued smoker, with PMH of HTN, Afib, CAD, CKD III, PVD, O2 dependent COPD, Papillary Thyroid CA and recent admit to [facility] for AMS (d/c'd 01/07/13).  Work up at that time demonstrated small R occipital lobe infarct.  Patient is well known to PCCM service, followed by Dr. Delton Coombes and [facility] for CHF / CAD.  Family reports he had a great day 7/29 and stated "this is the best day I have had in a long time".  Reportedly went to bed last pm around 9:30.  Granddaughter found him this am altered / lethargic without oxygen in place.  She placed him on the floor and called EMS.  EMS attempted to intubate x2 and were unsuccessful.    ER evaluation demonstrated hypercarbic respiratory failure with ABG 7.20 / 111 / 108 / 44, renal insufficiency with sr cr of 1.45, elevated BNP but down from baseline.  Pt placed on Bipap.  Marginal mental status.  PCCM called for admit.     PAST MEDICAL HISTORY :  Past Medical History  Diagnosis Date  . Hypertension   . Hyperlipidemia   . Peripheral vascular disease     Hx of Ax-fem BPG, known LCCA disease  . CHF (congestive heart failure)     Ischemic cardiomyopathy EF 30 - 35%  . Cancer     skin cancer, Thyroid cancer  . Stroke 2004    minor  .  Dysrhythmia     PAF  . COPD (chronic obstructive pulmonary disease)     PT DENIES USES O2 2 LITERS HS  . Heart murmur     Mild-moderate MR  . Hepatitis     AT AGE  66; unknown type    . Heart attack     03/17/12; subtotally occluded RCI with unsucessful PCI  . Coronary artery disease     Cardiologist is Dr. Nanetta Batty   . Thyroid cancer     Papillary  . Renal insufficiency   . Somnolence, daytime, secondary to elevated CO2 01/16/2013   Past Surgical History  Procedure Laterality Date  . Bypass graft  07/2010    axillobifemoral BPG  . Spine surgery  11/23/06    microdiscectomy L5-S1  . Hemorrhoid surgery    . Tonsillectomy    . Hernia repair  02/09/11    [facility]  . Cardiac catheterization      2013  . Coronary angioplasty      2013  . Thyroidectomy Bilateral 10/07/2012    Procedure: THYROIDECTOMY;  Surgeon: Darletta Moll, MD;  Location: [facility] OR;  Service: ENT;  Laterality: Bilateral;  . Radical neck dissection Left 10/07/2012    Procedure: RADICAL NECK DISSECTION;  Surgeon: Darletta Moll, MD;  Location: [facility] OR;  Service: ENT;  Laterality: Left;   Prior to Admission medications   Medication  Sig Start Date End Date Taking? Authorizing Provider  albuterol (PROVENTIL) (5 MG/ML) 0.5% nebulizer solution Take 0.5 mLs (2.5 mg total) by nebulization every 3 (three) hours as needed for wheezing or shortness of breath. 12/10/12   Wilburt Finlay, PA-C  apixaban (ELIQUIS) 2.5 MG TABS tablet Take 1 tablet (2.5 mg total) by mouth 2 (two) times daily. 01/08/13   Drema Dallas, MD  bisacodyl (DULCOLAX) 5 MG EC tablet Take 2 tablets (10 mg total) by mouth daily as needed. 01/05/13   Drema Dallas, MD  budesonide (PULMICORT) 0.25 MG/2ML nebulizer solution Take 2 mLs (0.25 mg total) by nebulization every 6 (six) hours. 12/10/12   Wilburt Finlay, PA-C  calcitRIOL (ROCALTROL) 0.5 MCG capsule Take 0.5 mcg by mouth daily.    Historical Provider, MD  calcium carbonate (OS-CAL - DOSED IN MG OF ELEMENTAL CALCIUM) 1250 MG  tablet Take 1 tablet (500 mg of elemental calcium total) by mouth 4 (four) times daily. 10/10/12   Darletta Moll, MD  digoxin 62.5 MCG TABS Take 0.0625 mg by mouth daily. 01/05/13   Drema Dallas, MD  furosemide (LASIX) 40 MG tablet Take 1 tablet (40 mg total) by mouth 2 (two) times daily. 12/10/12   Wilburt Finlay, PA-C  gabapentin (NEURONTIN) 600 MG tablet Take 600 mg by mouth 2 (two) times daily.     Historical Provider, MD  hydrALAZINE (APRESOLINE) 25 MG tablet Take 3 tablets (75 mg total) by mouth 2 (two) times daily. 12/16/12   Wilburt Finlay, PA-C  isosorbide mononitrate (IMDUR) 30 MG 24 hr tablet Take 1 tablet (30 mg total) by mouth daily. 04/06/12   Nada Boozer, NP  levothyroxine (SYNTHROID, LEVOTHROID) 125 MCG tablet Take 125 mcg by mouth daily before breakfast.    Historical Provider, MD  Maltodextrin-Xanthan Gum (RESOURCE THICKENUP CLEAR) POWD As directed on the packaging. 12/10/12   Wilburt Finlay, PA-C  metoprolol succinate (TOPROL-XL) 25 MG 24 hr tablet Take 25 mg by mouth daily.    Historical Provider, MD  nitroGLYCERIN (NITROSTAT) 0.4 MG SL tablet Place 1 tablet (0.4 mg total) under the tongue every 5 (five) minutes x 3 doses as needed for chest pain. 04/06/12   Nada Boozer, NP  oxyCODONE-acetaminophen (PERCOCET/ROXICET) 5-325 MG per tablet Take 1-2 tablets by mouth every 4 (four) hours as needed for pain. 10/10/12   Darletta Moll, MD  pantoprazole (PROTONIX) 40 MG tablet Take 1 tablet (40 mg total) by mouth daily at 12 noon. 04/06/12   Nada Boozer, NP  potassium chloride (K-DUR,KLOR-CON) 10 MEQ tablet Take 10 mEq by mouth daily.    Historical Provider, MD  senna (SENOKOT) 8.6 MG TABS Take 1 tablet (8.6 mg total) by mouth at bedtime. 01/05/13   Drema Dallas, MD  simvastatin (ZOCOR) 20 MG tablet Take 1 tablet (20 mg total) by mouth daily at 6 PM. 01/05/13   Drema Dallas, MD  tiotropium (SPIRIVA) 18 MCG inhalation capsule Place 1 capsule (18 mcg total) into inhaler and inhale daily. 10/22/12   Penny Pia, MD   Allergies  Allergen Reactions  . Ceclor (Cefaclor) Anaphylaxis    Recently took pcn for tooth problems, without issues  . Morphine And Related Other (See Comments)    Abnormal behavior    FAMILY HISTORY:  Family History  Problem Relation Age of Onset  . Alzheimer's disease Mother   . Heart failure Mother   . Heart disease Mother   . Cancer Father     prostate  .  Heart disease Father   . Heart attack Father   . Cancer Brother     prostate  . Stroke Brother    SOCIAL HISTORY:  reports that he quit smoking about 2 weeks ago. His smoking use included Cigarettes. He has a 50 pack-year smoking history. He has never used smokeless tobacco. He reports that he drinks about 4.2 ounces of alcohol per week. He reports that he does not use illicit drugs.  REVIEW OF SYSTEMS:  Unable to complete as pt is altered.  Medical information obtained from family and previous medical documentation.   SUBJECTIVE: RN reports pt was gurgling on arrival with bagging.   VITAL SIGNS: Temp:  [97.8 F (36.6 C)] 97.8 F (36.6 C) (07/30 1134) Pulse Rate:  [68-73] 73 (07/30 1215) Resp:  [17-67] 67 (07/30 1309) BP: (171-196)/(55-88) 192/67 mmHg (07/30 1309) SpO2:  [80 %-100 %] 97 % (07/30 1318) FiO2 (%):  [30 %-50 %] 50 % (07/30 1318) Weight:  [125 lb (56.7 kg)] 125 lb (56.7 kg) (07/30 1134)  HEMODYNAMICS:    VENTILATOR SETTINGS: Vent Mode:  [-] BIPAP;PCV FiO2 (%):  [30 %-50 %] 50 % Set Rate:  [12 bmp] 12 bmp  INTAKE / OUTPUT: Intake/Output   None     PHYSICAL EXAMINATION: General:  Frail elderly male, obtunded in bed, moderate distress on NIMV Neuro:  Lethargic / arouses to stimulation and drifts back to sleep HEENT:  Mm pink/moist, bipap in place Cardiovascular:  s1s2 rrr, no m/r/g Lungs:  resp's even/labored, lungs bilaterally with decreased air movement, no wheeze, abd accessory muscle use Musculoskeletal:  No acute deformities Skin:  Warm/dry, no edema  LABS:  Recent  Labs Lab 01/13/13 1514 01/18/13 1125  NA 138  --   K 3.8  --   CL 91*  --   CO2 36*  --   GLUCOSE 83  --   BUN 20  --   CREATININE 1.33  --   CALCIUM 9.6  --   MG 1.8  --   PROBNP 28493.00*  --   PHART  --  7.207*  PCO2ART  --  111.4*  PO2ART  --  108.0*   No results found for this basename: GLUCAP,  in the last 168 hours  CXR: 7/30 - ? RUL infiltrate, L basilar opacity, R rib rx's  ASSESSMENT / PLAN:  PULMONARY A: Acute on Chronic Respiratory Failure RUL Infiltrate - concern for aspiration  End-Stage COPD - baseline O2 dependent P:   -cycle bipap:  4 hours on 2 hours off.  If further decompensation in mental status would not use bipap further due to risk of aspiration - family agrees -PRN morphine for increased work of breathing- will likely need escalation to drip -DNR / DNI -continue albuterol, pulmicort, atrovent  -received steroids in ER, will hold on admit, no wheeze, low threshold to add Neg balance as goal   CARDIOVASCULAR A:  CAD - hx of MID in 02/2012 CHF - EF 35-40% PVD - carotid and LE disease HTN HLD P:  -lasix 40 Q12 x2 doses.  On 40 mg BID at home.   -continue IV digoxin -IV metoprolol  (on metoprolol at home) -hold eliquis, no IV heparin   RENAL A:   Chronic Kidney Disease - stage III, current cr at ~ baseline P:   -f/u BMP with diuresis, minimize labs as able  GASTROINTESTINAL A:   At risk aspiration  P:   -NPO -D51/2 NS  HEMATOLOGIC A:   Anemia P:  -  monitor H/H scd  INFECTIOUS A:   ? RUL Aspiration  P:   -ceftaz for asp coverage, recent nosocomial exposure -may need chest PT  ENDOCRINE A:   Thyroid Cancer  - s/p total thyroidectomy with neck dissection secondary to thyroid carcinoma with lymph node metastasis P:   -synthroid IV  NEUROLOGIC A:   Acute Encephalopathy - in setting of hypercarbia.  Hx of recent small CVA, concern for extension??  Imaging would not change current plan of care based on family & patient  wishes Hx CVA - new R small CVA 7/10 admit P:   -supportive care -would progress to comfort care if worsened distress / change in current clinical status  Canary Brim, NP-C [facility] Pgr: [phone] or [phone]  I have personally obtained a history, examined the patient, evaluated laboratory and imaging results, formulated the assessment and plan and placed orders.  CRITICAL CARE: The patient is critically ill with multiple organ systems failure and requires high complexity decision making for assessment and support, frequent evaluation and titration of therapies, application of advanced monitoring technologies and extensive interpretation of multiple databases. Critical Care Time devoted to patient care services described in this note is 35 minutes.   Updated sister and grandaughter  Mcarthur Rossetti. Tyson Alias, MD, FACP Pgr: [phone] [place] [facility]  Pulmonary and Critical Care Medicine [facility] Pager: [phone]  01/18/2013, 1:21 PM

## 2013-01-18 NOTE — ED Notes (Signed)
granddaughter crystal can be reached at [phone]. Lives with pt home number [phone]

## 2013-01-19 ENCOUNTER — Inpatient Hospital Stay: Payer: Medicare Other | Admitting: Emergency Medicine

## 2013-01-19 DIAGNOSIS — J810 Acute pulmonary edema: Secondary | ICD-10-CM

## 2013-01-19 DIAGNOSIS — I6350 Cerebral infarction due to unspecified occlusion or stenosis of unspecified cerebral artery: Secondary | ICD-10-CM

## 2013-01-19 DIAGNOSIS — R1310 Dysphagia, unspecified: Secondary | ICD-10-CM

## 2013-01-19 DIAGNOSIS — E43 Unspecified severe protein-calorie malnutrition: Secondary | ICD-10-CM

## 2013-01-19 DIAGNOSIS — J4489 Other specified chronic obstructive pulmonary disease: Secondary | ICD-10-CM

## 2013-01-19 DIAGNOSIS — I2510 Atherosclerotic heart disease of native coronary artery without angina pectoris: Secondary | ICD-10-CM

## 2013-01-19 DIAGNOSIS — E871 Hypo-osmolality and hyponatremia: Secondary | ICD-10-CM

## 2013-01-19 DIAGNOSIS — J449 Chronic obstructive pulmonary disease, unspecified: Secondary | ICD-10-CM

## 2013-01-19 DIAGNOSIS — J189 Pneumonia, unspecified organism: Secondary | ICD-10-CM

## 2013-01-19 DIAGNOSIS — I519 Heart disease, unspecified: Secondary | ICD-10-CM

## 2013-01-19 LAB — URINALYSIS, ROUTINE W REFLEX MICROSCOPIC
Bilirubin Urine: NEGATIVE
Hgb urine dipstick: NEGATIVE
Ketones, ur: NEGATIVE mg/dL
Specific Gravity, Urine: 1.015 (ref 1.005–1.030)
Urobilinogen, UA: 0.2 mg/dL (ref 0.0–1.0)
pH: 6 (ref 5.0–8.0)

## 2013-01-19 LAB — BASIC METABOLIC PANEL
BUN: 20 mg/dL (ref 6–23)
CO2: 39 mEq/L — ABNORMAL HIGH (ref 19–32)
Chloride: 89 mEq/L — ABNORMAL LOW (ref 96–112)
Creatinine, Ser: 1.29 mg/dL (ref 0.50–1.35)
GFR calc Af Amer: 63 mL/min — ABNORMAL LOW (ref >90)

## 2013-01-19 MED ORDER — DEXTROSE-NACL 5-0.45 % IV SOLN
INTRAVENOUS | Status: DC
  Administered 2013-01-19: 1000 mL via INTRAVENOUS
  Administered 2013-01-23 – 2013-01-25 (×3): 20 mL/h via INTRAVENOUS

## 2013-01-19 MED ORDER — CHLORHEXIDINE GLUCONATE CLOTH 2 % EX PADS
6.0000 | MEDICATED_PAD | Freq: Every day | CUTANEOUS | Status: AC
  Administered 2013-01-19 – 2013-01-23 (×5): 6 via TOPICAL

## 2013-01-19 MED ORDER — FUROSEMIDE 40 MG PO TABS
40.0000 mg | ORAL_TABLET | Freq: Two times a day (BID) | ORAL | Status: DC
  Administered 2013-01-19 – 2013-01-26 (×14): 40 mg via ORAL
  Filled 2013-01-19 (×16): qty 1

## 2013-01-19 MED ORDER — MUPIROCIN 2 % EX OINT
1.0000 "application " | TOPICAL_OINTMENT | Freq: Two times a day (BID) | CUTANEOUS | Status: AC
  Administered 2013-01-19 – 2013-01-23 (×10): 1 via NASAL
  Filled 2013-01-19 (×2): qty 22

## 2013-01-19 MED ORDER — SIMVASTATIN 20 MG PO TABS
20.0000 mg | ORAL_TABLET | Freq: Every day | ORAL | Status: DC
  Administered 2013-01-19 – 2013-01-25 (×7): 20 mg via ORAL
  Filled 2013-01-19 (×8): qty 1

## 2013-01-19 MED ORDER — TIOTROPIUM BROMIDE MONOHYDRATE 18 MCG IN CAPS
18.0000 ug | ORAL_CAPSULE | Freq: Every day | RESPIRATORY_TRACT | Status: DC
  Administered 2013-01-20 – 2013-01-26 (×7): 18 ug via RESPIRATORY_TRACT
  Filled 2013-01-19 (×2): qty 5

## 2013-01-19 MED ORDER — HYDRALAZINE HCL 50 MG PO TABS
75.0000 mg | ORAL_TABLET | Freq: Two times a day (BID) | ORAL | Status: DC
  Administered 2013-01-19 – 2013-01-26 (×15): 75 mg via ORAL
  Filled 2013-01-19 (×16): qty 1

## 2013-01-19 MED ORDER — ISOSORBIDE MONONITRATE ER 30 MG PO TB24
30.0000 mg | ORAL_TABLET | Freq: Every day | ORAL | Status: DC
  Administered 2013-01-19 – 2013-01-26 (×8): 30 mg via ORAL
  Filled 2013-01-19 (×9): qty 1

## 2013-01-19 MED ORDER — DIGOXIN 0.0625 MG HALF TABLET
0.0625 mg | ORAL_TABLET | Freq: Every day | ORAL | Status: DC
  Administered 2013-01-19 – 2013-01-26 (×7): 0.0625 mg via ORAL
  Filled 2013-01-19 (×9): qty 1

## 2013-01-19 MED ORDER — METOPROLOL SUCCINATE ER 25 MG PO TB24
25.0000 mg | ORAL_TABLET | Freq: Every day | ORAL | Status: DC
  Administered 2013-01-19 – 2013-01-26 (×8): 25 mg via ORAL
  Filled 2013-01-19 (×8): qty 1

## 2013-01-19 MED ORDER — LEVOTHYROXINE SODIUM 125 MCG PO TABS
125.0000 ug | ORAL_TABLET | Freq: Every day | ORAL | Status: DC
  Administered 2013-01-20 – 2013-01-26 (×7): 125 ug via ORAL
  Filled 2013-01-19 (×10): qty 1

## 2013-01-19 MED ORDER — APIXABAN 2.5 MG PO TABS
2.5000 mg | ORAL_TABLET | Freq: Two times a day (BID) | ORAL | Status: DC
  Administered 2013-01-19 – 2013-01-26 (×15): 2.5 mg via ORAL
  Filled 2013-01-19 (×18): qty 1

## 2013-01-19 NOTE — H&P (Signed)
PULMONARY  / CRITICAL CARE MEDICINE  Name: [name] [name] MRN: [id number] DOB: 1941-04-02    ADMISSION DATE:  01/18/2013  REFERRING MD :  Dr. Denton Lank - EDP PRIMARY SERVICE: PCCM  CHIEF COMPLAINT:  AMS, Acute Resp Fx  BRIEF PATIENT DESCRIPTION: 72 y/o M, complex medical hx, who presented to [facility] ER 7/30 with AMS, hypercarbic respiratory fx.  PCCM called for admit.    SIGNIFICANT EVENTS / STUDIES:  7/10-7/19:  Admit for small R occipital lobe infarct, CHF, COPD ........................................................................................................ 7/30 - Admit with Hypercarbic Resp Fx   LINES / TUBES: piv  CULTURES: none  ANTIBIOTICS: fortaz (empiric asp) 7/30>>>  SUBJECTIVE:  Better this am  VITAL SIGNS: Temp:  [97.8 F (36.6 C)-98.8 F (37.1 C)] 97.8 F (36.6 C) (07/31 0728) Pulse Rate:  [60-86] 60 (07/31 0727) Resp:  [14-67] 19 (07/31 0727) BP: (155-196)/(45-88) 155/45 mmHg (07/31 0726) SpO2:  [80 %-100 %] 99 % (07/31 0907) FiO2 (%):  [30 %-50 %] 50 % (07/31 0347) Weight:  [56.5 kg (124 lb 9 oz)-56.7 kg (125 lb)] 56.5 kg (124 lb 9 oz) (07/31 0347)  HEMODYNAMICS:    VENTILATOR SETTINGS: Vent Mode:  [-] BIPAP;PCV FiO2 (%):  [30 %-50 %] 50 % Set Rate:  [12 bmp] 12 bmp  INTAKE / OUTPUT: Intake/Output     07/30 0701 - 07/31 0700 07/31 0701 - 08/01 0700   I.V. (mL/kg) 200 (3.5)    Total Intake(mL/kg) 200 (3.5)    Urine (mL/kg/hr) 650    Total Output 650 0   Net -450 0        Urine Occurrence 1 x    Stool Occurrence 1 x      PHYSICAL EXAMINATION: General:  Frail elderly male, no distress off bipap Neuro:  More alert HEENT:  Mm pink/moist, bipap in place Cardiovascular:  s1s2 rrr, no m/r/g Lungs: clearer  Musculoskeletal:  No acute deformities Skin:  Warm/dry, no edema  LABS:  Recent Labs Lab 01/13/13 1514 01/18/13 1125 01/18/13 1239 01/18/13 1240 01/19/13 0429  HGB  --   --  10.4*  --   --   WBC  --   --  11.5*  --   --    PLT  --   --  261  --   --   NA 138  --  133*  --  137  K 3.8  --  4.6  --  3.5  CL 91*  --  87*  --  89*  CO2 36*  --  39*  --  39*  GLUCOSE 83  --  145*  --  147*  BUN 20  --  21  --  20  CREATININE 1.33  --  1.45*  --  1.29  CALCIUM 9.6  --  10.1  --  9.0  MG 1.8  --   --   --  1.7  AST  --   --  36  --   --   ALT  --   --  32  --   --   ALKPHOS  --   --  92  --   --   BILITOT  --   --  0.2*  --   --   PROT  --   --  7.3  --   --   ALBUMIN  --   --  3.4*  --   --   INR  --   --  1.30  --   --  TROPONINI  --   --   --  <0.30  --   PROBNP 28493.00*  --   --  17087.0*  --   PHART  --  7.207*  --   --   --   PCO2ART  --  111.4*  --   --   --   PO2ART  --  108.0*  --   --   --    No results found for this basename: GLUCAP,  in the last 168 hours  CXR: 7/31 - ? RUL infiltrate, L basilar opacity, R rib rx's  ASSESSMENT / PLAN:  PULMONARY A: Acute on Chronic Respiratory Failure RUL Infiltrate - concern for aspiration  End-Stage COPD - baseline O2 dependent P:   -pall care consult -cont neb meds -spiriva -medrol IV lasix to cont tfr to tele Pall care consult  CARDIOVASCULAR A:  CAD - hx of MID in 02/2012 CHF - EF 35-40% PVD - carotid and LE disease HTN HLD P:  -PO lasix -PO dig and BB -resume eliquis  RENAL A:   Chronic Kidney Disease - stage III, current cr at ~ baseline P:   -f/u BMP with diuresis, minimize labs as able  GASTROINTESTINAL A:   At risk aspiration  P:   -po diet -D51/2 NS kvo  HEMATOLOGIC A:   Anemia P:  -monitor H/H scd  INFECTIOUS A:   ? RUL Aspiration  P:   -ceftaz for asp coverage, recent nosocomial exposure   ENDOCRINE A:   Thyroid Cancer  - s/p total thyroidectomy with neck dissection secondary to thyroid carcinoma with lymph node metastasis P:   -synthroid IV  NEUROLOGIC A:   Acute Encephalopathy - in setting of hypercarbia.  Hx of recent small CVA, concern for extension??  Imaging would not change current  plan of care based on family & patient wishes Hx CVA - new R small CVA 7/10 admit P:   -supportive care -pall care consult  Updated son at bedside  Shan Levans Beeper  [phone]  Cell  [phone]  If no response or cell goes to voicemail, call beeper [phone]   Pulmonary and Critical Care Medicine [facility] Pager: [phone]  01/19/2013, 9:36 AM

## 2013-01-19 NOTE — Progress Notes (Signed)
Thank you for consulting the Palliative Medicine Team at [facility] to meet your patient's and family's needs.   The reason that you asked [place] to see your patient is for goals of care and related symptom management  We have scheduled your patient for a meeting:  Unable to schedule at this time -await a call back for patient's son  The Surrogate decision maker is: son Aidin Doane [phone]  Other family members that need to be present: patient's granddaughter [name]; possibly daughter Gloris Manchester by phone   Your patient is able/unable to participate: TBD - on this visit patient awakes to voice, able to state he was in [place] however easily drifts off to sleep  Additional Narrative: spoke with granddaughter [name] at bedside, patient's ex-wife was also at bedside- [employer] informed, she lives with patient and would want to be able to participate in discussion-her dad, patient's son [name], had left the hospital- she stated her dad and her aunt Gloris Manchester (patient's daughter Advertising account executive) were making decisions for patient's care- per [name], Gloris Manchester is currently out of the country in [place], however they may see if she can participate by phone -  Attempted to contact son [name] @ [phone] -voice message left and await call back -PMT contact number also left with granddaughter, Hettie Holstein, RN 01/19/2013, 12:07 PM Palliative Medicine Team RN Liaison [phone]

## 2013-01-19 NOTE — Progress Notes (Signed)
Pharmacist Heart Failure Core Measure Documentation  Assessment: [name] [name] has an EF documented as 20-30% on 01/02/13 by ECHO  Rationale: Heart failure patients with left ventricular systolic dysfunction (LVSD) and an EF < 40% should be prescribed an angiotensin converting enzyme inhibitor (ACEI) or angiotensin receptor blocker (ARB) at discharge unless a contraindication is documented in the medical record.  This patient is not currently on an ACEI or ARB for HF.  This note is being placed in the record in order to provide documentation that a contraindication to the use of these agents is present for this encounter.  ACE Inhibitor or Angiotensin Receptor Blocker is contraindicated (specify all that apply)  []   ACEI allergy AND ARB allergy []   Angioedema []   Moderate or severe aortic stenosis []   Hyperkalemia []   Hypotension []   Renal artery stenosis [x]   Worsening renal function, preexisting renal disease or dysfunction   Georgina Pillion, PharmD, BCPS Clinical Pharmacist Pager: [phone] 01/19/2013 3:21 PM

## 2013-01-19 NOTE — Progress Notes (Signed)
Report called to receiving nurse [id number], no questions.  Family aware of transfer. No complaints

## 2013-01-19 NOTE — Consult Note (Signed)
Reason for Consult: HF Referring Physician: Critical Care   HPI: The patient is a 72 year old male with a history of COPD (chronic O2--2L), coronary artery disease, systolic and diastolic CHF, continued tobacco use, papillary thyroid carcinoma, CKD stage III, and hypertension. Over the last 6 months he has required multiple admissions for CHF and COPD exacerbations. He had a recent admission just two weeks ago for acute CHF. He was readmitted on 01/18/13 for hypercarbic respiratory failure, after being found unresponsive at his home. On arrival his  PCO2 was 111.4. He was admitted by Critical Care and treated with BiPap. Pro BNP was elevated at 17K. [facility] has been consulted for cardiology recommendations. He is now off Bipap and using nasal canula. He states that he is comfortable.    Past Medical History  Diagnosis Date  . Hypertension   . Hyperlipidemia   . Peripheral vascular disease     Hx of Ax-fem BPG, known LCCA disease  . CHF (congestive heart failure)     Ischemic cardiomyopathy EF 30 - 35%  . Cancer     skin cancer, Thyroid cancer  . Stroke 2004    minor  . Dysrhythmia     PAF  . COPD (chronic obstructive pulmonary disease)     PT DENIES USES O2 2 LITERS HS  . Heart murmur     Mild-moderate MR  . Hepatitis     AT AGE  39; unknown type    . Heart attack     03/17/12; subtotally occluded RCI with unsucessful PCI  . Coronary artery disease     Cardiologist is Dr. Nanetta Batty   . Thyroid cancer     Papillary  . Renal insufficiency   . Somnolence, daytime, secondary to elevated CO2 01/16/2013    Past Surgical History  Procedure Laterality Date  . Bypass graft  07/2010    axillobifemoral BPG  . Spine surgery  11/23/06    microdiscectomy L5-S1  . Hemorrhoid surgery    . Tonsillectomy    . Hernia repair  02/09/11    Ventral  . Cardiac catheterization      2013  . Coronary angioplasty      2013  . Thyroidectomy Bilateral 10/07/2012    Procedure: THYROIDECTOMY;  Surgeon:  Darletta Moll, MD;  Location: [facility] OR;  Service: ENT;  Laterality: Bilateral;  . Radical neck dissection Left 10/07/2012    Procedure: RADICAL NECK DISSECTION;  Surgeon: Darletta Moll, MD;  Location: [facility] OR;  Service: ENT;  Laterality: Left;    Family History  Problem Relation Age of Onset  . Alzheimer's disease Mother   . Heart failure Mother   . Heart disease Mother   . Cancer Father     prostate  . Heart disease Father   . Heart attack Father   . Cancer Brother     prostate  . Stroke Brother     Social History:  reports that he quit smoking about 3 weeks ago. His smoking use included Cigarettes. He has a 50 pack-year smoking history. He has never used smokeless tobacco. He reports that he drinks about 4.2 ounces of alcohol per week. He reports that he does not use illicit drugs.  Allergies:  Allergies  Allergen Reactions  . Ceclor (Cefaclor) Anaphylaxis    Recently took pcn for tooth problems, without issues  . Morphine And Related Other (See Comments)    Abnormal behavior    Medications:  Prior to Admission medications   Medication Sig Start  Date End Date Taking? Authorizing Provider  albuterol (PROVENTIL) (5 MG/ML) 0.5% nebulizer solution Take 0.5 mLs (2.5 mg total) by nebulization every 3 (three) hours as needed for wheezing or shortness of breath. 12/10/12  Yes Wilburt Finlay, PA-C  apixaban (ELIQUIS) 2.5 MG TABS tablet Take 1 tablet (2.5 mg total) by mouth 2 (two) times daily. 01/08/13  Yes Drema Dallas, MD  bisacodyl (DULCOLAX) 5 MG EC tablet Take 2 tablets (10 mg total) by mouth daily as needed. 01/05/13  Yes Drema Dallas, MD  budesonide (PULMICORT) 0.25 MG/2ML nebulizer solution Take 2 mLs (0.25 mg total) by nebulization every 6 (six) hours. 12/10/12  Yes Wilburt Finlay, PA-C  calcitRIOL (ROCALTROL) 0.5 MCG capsule Take 0.5 mcg by mouth daily.   Yes Historical Provider, MD  calcium carbonate (OS-CAL - DOSED IN MG OF ELEMENTAL CALCIUM) 1250 MG tablet Take 1 tablet (500 mg of elemental  calcium total) by mouth 4 (four) times daily. 10/10/12  Yes Darletta Moll, MD  digoxin 62.5 MCG TABS Take 0.0625 mg by mouth daily. 01/05/13  Yes Drema Dallas, MD  furosemide (LASIX) 40 MG tablet Take 1 tablet (40 mg total) by mouth 2 (two) times daily. 12/10/12  Yes Wilburt Finlay, PA-C  gabapentin (NEURONTIN) 600 MG tablet Take 600 mg by mouth 2 (two) times daily.    Yes Historical Provider, MD  hydrALAZINE (APRESOLINE) 25 MG tablet Take 3 tablets (75 mg total) by mouth 2 (two) times daily. 12/16/12  Yes Wilburt Finlay, PA-C  isosorbide mononitrate (IMDUR) 30 MG 24 hr tablet Take 1 tablet (30 mg total) by mouth daily. 04/06/12  Yes Nada Boozer, NP  levothyroxine (SYNTHROID, LEVOTHROID) 125 MCG tablet Take 125 mcg by mouth daily before breakfast.   Yes Historical Provider, MD  metoprolol succinate (TOPROL-XL) 25 MG 24 hr tablet Take 25 mg by mouth daily.   Yes Historical Provider, MD  pantoprazole (PROTONIX) 40 MG tablet Take 40 mg by mouth daily as needed.   Yes Historical Provider, MD  potassium chloride (K-DUR,KLOR-CON) 10 MEQ tablet Take 10 mEq by mouth daily.   Yes Historical Provider, MD  senna (SENOKOT) 8.6 MG TABS Take 1 tablet (8.6 mg total) by mouth at bedtime. 01/05/13  Yes Drema Dallas, MD  simvastatin (ZOCOR) 20 MG tablet Take 1 tablet (20 mg total) by mouth daily at 6 PM. 01/05/13  Yes Drema Dallas, MD  tiotropium (SPIRIVA) 18 MCG inhalation capsule Place 1 capsule (18 mcg total) into inhaler and inhale daily. 10/22/12  Yes Penny Pia, MD  nitroGLYCERIN (NITROSTAT) 0.4 MG SL tablet Place 1 tablet (0.4 mg total) under the tongue every 5 (five) minutes x 3 doses as needed for chest pain. 04/06/12   Nada Boozer, NP      Results for orders placed during the hospital encounter of 01/18/13 (from the past 48 hour(s))  POCT I-STAT 3, BLOOD GAS (G3+)     Status: Abnormal   Collection Time    01/18/13 11:25 AM      Result Value Range   pH, Arterial 7.207 (*) 7.350 - 7.450   pCO2 arterial 111.4  (*) 35.0 - 45.0 mmHg   pO2, Arterial 108.0 (*) 80.0 - 100.0 mmHg   Bicarbonate 44.3 (*) 20.0 - 24.0 mEq/L   TCO2 48  0 - 100 mmol/L   O2 Saturation 96.0     Acid-Base Excess 12.0 (*) 0.0 - 2.0 mmol/L   Patient temperature 98.6 F     Collection site RADIAL, ALLEN'S  TEST ACCEPTABLE     Drawn by Operator     Sample type ARTERIAL     Comment NOTIFIED PHYSICIAN    COMPREHENSIVE METABOLIC PANEL     Status: Abnormal   Collection Time    01/18/13 12:39 PM      Result Value Range   Sodium 133 (*) 135 - 145 mEq/L   Potassium 4.6  3.5 - 5.1 mEq/L   Chloride 87 (*) 96 - 112 mEq/L   CO2 39 (*) 19 - 32 mEq/L   Glucose, Bld 145 (*) 70 - 99 mg/dL   BUN 21  6 - 23 mg/dL   Creatinine, Ser 1.61 (*) 0.50 - 1.35 mg/dL   Calcium 09.6  8.4 - 04.5 mg/dL   Total Protein 7.3  6.0 - 8.3 g/dL   Albumin 3.4 (*) 3.5 - 5.2 g/dL   AST 36  0 - 37 U/L   ALT 32  0 - 53 U/L   Alkaline Phosphatase 92  39 - 117 U/L   Total Bilirubin 0.2 (*) 0.3 - 1.2 mg/dL   GFR calc non Af Amer 47 (*) >90 mL/min   GFR calc Af Amer 54 (*) >90 mL/min   Comment:            The eGFR has been calculated     using the CKD EPI equation.     This calculation has not been     validated in all clinical     situations.     eGFR's persistently     <90 mL/min signify     possible Chronic Kidney Disease.  CBC     Status: Abnormal   Collection Time    01/18/13 12:39 PM      Result Value Range   WBC 11.5 (*) 4.0 - 10.5 K/uL   RBC 4.05 (*) 4.22 - 5.81 MIL/uL   Hemoglobin 10.4 (*) 13.0 - 17.0 g/dL   HCT 40.9 (*) 81.1 - 91.4 %   MCV 85.7  78.0 - 100.0 fL   MCH 25.7 (*) 26.0 - 34.0 pg   MCHC 30.0  30.0 - 36.0 g/dL   RDW 78.2 (*) 95.6 - 21.3 %   Platelets 261  150 - 400 K/uL  PROTIME-INR     Status: Abnormal   Collection Time    01/18/13 12:39 PM      Result Value Range   Prothrombin Time 15.9 (*) 11.6 - 15.2 seconds   INR 1.30  0.00 - 1.49  TROPONIN I     Status: None   Collection Time    01/18/13 12:40 PM      Result Value  Range   Troponin I <0.30  <0.30 ng/mL   Comment:            Due to the release kinetics of cTnI,     a negative result within the first hours     of the onset of symptoms does not rule out     myocardial infarction with certainty.     If myocardial infarction is still suspected,     repeat the test at appropriate intervals.  PRO B NATRIURETIC PEPTIDE     Status: Abnormal   Collection Time    01/18/13 12:40 PM      Result Value Range   Pro B Natriuretic peptide (BNP) 17087.0 (*) 0 - 125 pg/mL  MRSA PCR SCREENING     Status: Abnormal   Collection Time    01/18/13  2:59 PM  Result Value Range   MRSA by PCR POSITIVE (*) NEGATIVE   Comment:            The GeneXpert MRSA Assay (FDA     approved for NASAL specimens     only), is one component of a     comprehensive MRSA colonization     surveillance program. It is not     intended to diagnose MRSA     infection nor to guide or     monitor treatment for     MRSA infections.     RESULT CALLED TO, READ BACK BY AND VERIFIED WITH:     L.WORLEY RN AT 2041 BY L.PITT 01/18/13  BASIC METABOLIC PANEL     Status: Abnormal   Collection Time    01/19/13  4:29 AM      Result Value Range   Sodium 137  135 - 145 mEq/L   Potassium 3.5  3.5 - 5.1 mEq/L   Comment: DELTA CHECK NOTED   Chloride 89 (*) 96 - 112 mEq/L   CO2 39 (*) 19 - 32 mEq/L   Glucose, Bld 147 (*) 70 - 99 mg/dL   BUN 20  6 - 23 mg/dL   Creatinine, Ser 1.61  0.50 - 1.35 mg/dL   Calcium 9.0  8.4 - 09.6 mg/dL   GFR calc non Af Amer 54 (*) >90 mL/min   GFR calc Af Amer 63 (*) >90 mL/min   Comment:            The eGFR has been calculated     using the CKD EPI equation.     This calculation has not been     validated in all clinical     situations.     eGFR's persistently     <90 mL/min signify     possible Chronic Kidney Disease.  MAGNESIUM     Status: None   Collection Time    01/19/13  4:29 AM      Result Value Range   Magnesium 1.7  1.5 - 2.5 mg/dL    Dg Chest  Port 1 View  01/18/2013   *RADIOLOGY REPORT*  Clinical Data: Shortness of breath  PORTABLE CHEST - 1 VIEW  Comparison: December 31, 2012.  Findings: Mild cardiomegaly is noted.  Right rib fractures are again noted and unchanged.  No pneumothorax is noted.  Central pulmonary vascular congestion is again noted.  Left pleural effusion noted on prior exam is significantly smaller.  Left basilar opacity is noted which may represent subsegmental atelectasis or pneumonia.  Increased opacity is seen in right upper lobe which may represent developing infiltrate.  IMPRESSION: Possible developing right upper lobe infiltrate.  Right rib fractures are again noted.  Left basilar opacity is noted consistent with pneumonia or atelectasis.  Left pleural effusion noted on prior exam is significantly improved.   Original Report Authenticated By: Lupita Raider.,  M.D.    Review of Systems  Respiratory: Positive for cough, sputum production and shortness of breath.   Cardiovascular: Negative for chest pain.  Neurological: Positive for loss of consciousness.  All other systems reviewed and are negative.   Blood pressure 115/41, pulse 78, temperature 98.9 F (37.2 C), temperature source Oral, resp. rate 18, height 5\' 8"  (1.727 m), weight 121 lb 11.1 oz (55.2 kg), SpO2 98.00%. Physical Exam  Constitutional: No distress.  Frail appearing   Cardiovascular: Normal rate, regular rhythm, normal heart sounds and intact distal pulses.  Exam reveals no gallop  and no friction rub.   No murmur heard. Respiratory: Effort normal. No respiratory distress. He has wheezes. He has no rales.  Musculoskeletal: He exhibits no edema.  Skin: Skin is warm and dry. He is not diaphoretic.  Psychiatric: He has a normal mood and affect. His behavior is normal.    Assessment/Plan: Principal Problem:   Acute and chronic respiratory failure Active Problems:   COPD (chronic obstructive pulmonary disease)   Nosocomial pneumonia   Dysphagia,  post thyroid surgery, possible aspiration pneumonia   Protein-calorie malnutrition, severe   Acute pulmonary edema   Somnolence, daytime, secondary to elevated CO2  Plan: Pt seen and examined along with Dr. Herbie Baltimore. Family members are present at his bedside. A long discussion was held in regards to end-of-life issues and considering a palliative care approach. Pt agrees that he needs to hold discussion with family as to what his wishes will be. At this point we will continue on current heart medications. His BNP is actually improved compared to when he was seen in follow-up at our clinic. BNP at that time was >28K. BNP yesterday was 17K. Renal function is also improved. No change in meds at this point. We will continue to follow along.   Allayne Butcher, PA-C 01/19/2013, 4:18 PM   I have seen and evaluated the patient this PM along with Boyce Medici, PA. I agree with her findings, examination as well as impression recommendations.  Very sad situation with essentially terminal lung disease & severe ICM.  This time he presented with hypercapneic RF --potentially secondary to his having a "mixed drink" the evening before he was found unresponsive.  His body cannot handle the EtOH.     His proBNP is currently back to, or below baseline -- was up to 28 K from his recent clinic visit.  I would just maintain him on his usual dose regimen ~40 mg bid lasix.  -- we had mentioned considering Diamox for CO2 retention - but would potentially exacerbate his previous hyponatremia.  Hydralazine/Nitrate for afterload reduction.  Back on Eliquis for Afib - CVA prophylaxis.  I am in full agreement with initiating discussions re: palliative care discussions.  He very much wants to have his grandchildren present.   MD Time with pt: 20 min  HARDING,DAVID W, M.D., M.S. [facility] [address] [place], [place]  [address]  [phone] Pager #  [phone] 01/19/2013 5:07 PM

## 2013-01-19 NOTE — Progress Notes (Signed)
Per MD pt was allowed to remain of BIPAP throughout night unless needed. PT tolerated 50% venti mask well. RT continues to monitor.

## 2013-01-20 ENCOUNTER — Encounter (HOSPITAL_COMMUNITY): Payer: Self-pay | Admitting: Emergency Medicine

## 2013-01-20 ENCOUNTER — Encounter (HOSPITAL_COMMUNITY): Payer: Medicare Other

## 2013-01-20 DIAGNOSIS — R4182 Altered mental status, unspecified: Secondary | ICD-10-CM

## 2013-01-20 DIAGNOSIS — N183 Chronic kidney disease, stage 3 unspecified: Secondary | ICD-10-CM

## 2013-01-20 DIAGNOSIS — I4891 Unspecified atrial fibrillation: Secondary | ICD-10-CM

## 2013-01-20 DIAGNOSIS — E039 Hypothyroidism, unspecified: Secondary | ICD-10-CM

## 2013-01-20 DIAGNOSIS — J441 Chronic obstructive pulmonary disease with (acute) exacerbation: Secondary | ICD-10-CM

## 2013-01-20 DIAGNOSIS — R0902 Hypoxemia: Secondary | ICD-10-CM

## 2013-01-20 DIAGNOSIS — Z8673 Personal history of transient ischemic attack (TIA), and cerebral infarction without residual deficits: Secondary | ICD-10-CM

## 2013-01-20 DIAGNOSIS — I10 Essential (primary) hypertension: Secondary | ICD-10-CM

## 2013-01-20 LAB — CBC WITH DIFFERENTIAL/PLATELET
Eosinophils Absolute: 0 10*3/uL (ref 0.0–0.7)
Eosinophils Relative: 0 % (ref 0–5)
HCT: 29.7 % — ABNORMAL LOW (ref 39.0–52.0)
Hemoglobin: 9.5 g/dL — ABNORMAL LOW (ref 13.0–17.0)
Lymphs Abs: 0.6 10*3/uL — ABNORMAL LOW (ref 0.7–4.0)
MCH: 25.5 pg — ABNORMAL LOW (ref 26.0–34.0)
MCV: 79.8 fL (ref 78.0–100.0)
Monocytes Absolute: 0.3 10*3/uL (ref 0.1–1.0)
Monocytes Relative: 2 % — ABNORMAL LOW (ref 3–12)
RBC: 3.72 MIL/uL — ABNORMAL LOW (ref 4.22–5.81)

## 2013-01-20 LAB — BASIC METABOLIC PANEL
BUN: 32 mg/dL — ABNORMAL HIGH (ref 6–23)
CO2: 38 mEq/L — ABNORMAL HIGH (ref 19–32)
Chloride: 88 mEq/L — ABNORMAL LOW (ref 96–112)
Creatinine, Ser: 1.38 mg/dL — ABNORMAL HIGH (ref 0.50–1.35)

## 2013-01-20 MED ORDER — METHYLPREDNISOLONE SODIUM SUCC 40 MG IJ SOLR
20.0000 mg | Freq: Two times a day (BID) | INTRAMUSCULAR | Status: DC
  Administered 2013-01-21 – 2013-01-24 (×7): 20 mg via INTRAVENOUS
  Filled 2013-01-20 (×9): qty 0.5

## 2013-01-20 NOTE — Progress Notes (Signed)
PMT Follow-up - spoke with patient's son [name] [name] [phone]; [name] states that his sister, [name] [name] is POA/HCPOA and he wants her to be involved in this discussion- currently [name] [place] is out of the country and not expected to return until Monday - she would be available Tuesday to speak with the PMT provider -prior to that, he is agreeable to 'beginning' the discussion with his father and his daughter -patient's granddaughter, [name] who lives with patient and is the primary caregiver in the home - he stated he could be available Saturday 8/2 @ 1:30 pm for an initial conversation   [name], RN 01/20/2013, 11:00 AM Palliative Medicine Team RN Liaison [phone]

## 2013-01-20 NOTE — Progress Notes (Signed)
ANTIBIOTIC CONSULT NOTE - FOLLOW UP  Pharmacy Consult for Ceftazidime Indication: pneumonia  Allergies  Allergen Reactions  . Ceclor (Cefaclor) Anaphylaxis    Recently took pcn for tooth problems, without issues  . Morphine And Related Other (See Comments)    Abnormal behavior    Patient Measurements: Height: 5\' 8"  (172.7 cm) Weight: 121 lb 4.1 oz (55 kg) IBW/kg (Calculated) : 68.4  Vital Signs: Temp: 98 F (36.7 C) (08/01 0932) Temp src: Oral (08/01 0932) BP: 137/68 mmHg (08/01 1205) Pulse Rate: 61 (08/01 1205) Intake/Output from previous day: 07/31 0701 - 08/01 0700 In: 830 [P.O.:480; I.V.:250; IV Piggyback:100] Out: 100 [Urine:100] Intake/Output from this shift: Total I/O In: -  Out: 150 [Urine:150]  Labs:  Recent Labs  01/18/13 1239 01/19/13 0429 01/20/13 0615 01/20/13 0820  WBC 11.5*  --   --  15.0*  HGB 10.4*  --   --  9.5*  PLT 261  --   --  251  CREATININE 1.45* 1.29 1.38*  --    Estimated Creatinine Clearance: 38.2 ml/min (by C-G formula based on Cr of 1.38).  Microbiology: Recent Results (from the past 720 hour(s))  CULTURE, BLOOD (ROUTINE X 2)     Status: None   Collection Time    12/29/12  3:57 PM      Result Value Range Status   Specimen Description BLOOD HAND RIGHT   Final   Special Requests BOTTLES DRAWN AEROBIC ONLY 2.5CC   Final   Culture  Setup Time 12/29/2012 23:13   Final   Culture NO GROWTH 5 DAYS   Final   Report Status 01/04/2013 FINAL   Final  CULTURE, BLOOD (ROUTINE X 2)     Status: None   Collection Time    12/29/12  4:03 PM      Result Value Range Status   Specimen Description BLOOD ARM LEFT   Final   Special Requests BOTTLES DRAWN AEROBIC ONLY 10CC   Final   Culture  Setup Time 12/29/2012 23:13   Final   Culture NO GROWTH 5 DAYS   Final   Report Status 01/04/2013 FINAL   Final  MRSA PCR SCREENING     Status: Abnormal   Collection Time    01/18/13  2:59 PM      Result Value Range Status   MRSA by PCR POSITIVE (*)  NEGATIVE Final   Comment:            The GeneXpert MRSA Assay (FDA     approved for NASAL specimens     only), is one component of a     comprehensive MRSA colonization     surveillance program. It is not     intended to diagnose MRSA     infection nor to guide or     monitor treatment for     MRSA infections.     RESULT CALLED TO, READ BACK BY AND VERIFIED WITH:     L.WORLEY RN AT 2041 BY L.PITT 01/18/13    Anti-infectives   Start     Dose/Rate Route Frequency Ordered Stop   01/19/13 0600  cefTAZidime (FORTAZ) 1 g in dextrose 5 % 50 mL IVPB     1 g 100 mL/hr over 30 Minutes Intravenous Every 12 hours 01/18/13 1647     01/18/13 1700  cefTAZidime (FORTAZ) 2 g in dextrose 5 % 50 mL IVPB     2 g 100 mL/hr over 30 Minutes Intravenous  Once 01/18/13 1624 01/18/13 1840  01/18/13 1500  cefTRIAXone (ROCEPHIN) 1 g in dextrose 5 % 50 mL IVPB  Status:  Discontinued     1 g 100 mL/hr over 30 Minutes Intravenous Every 24 hours 01/18/13 1451 01/18/13 1534      Assessment: 71 YOM with end-stage COPD who was found unresponsive by his granddaughter and now is thought to have PNA as he was recently released from the hospital for a R occipital lobe infarct. Also recently diagnosed with thryoid cancer. Renal function remains relatively stable with a CrCl ~45mL/min. His WBC has increased to 15, but he remains afebrile. No cultures. He is also on IV steroids.  Goal of Therapy:  Eradication of infection  Plan:  1. Continue ceftazidime 1g IV Q12hr 2. F/u clinical progression, any cultures, renal function, and LOT  Elani Delph D. Shaylan Tutton, PharmD Clinical Pharmacist Pager: [phone] 01/20/2013 1:15 PM

## 2013-01-20 NOTE — Progress Notes (Signed)
1725 ate good . Family in attendance . No complaint presented. Seen by Dr. Craige Cotta with orders

## 2013-01-20 NOTE — Progress Notes (Signed)
PULMONARY  / CRITICAL CARE MEDICINE  Name: [name] [name] MRN: [id number] DOB: August 25, 1940    ADMISSION DATE:  01/18/2013  REFERRING MD :  Dr. Denton Lank - EDP PRIMARY SERVICE: PCCM  CHIEF COMPLAINT:  AMS, Acute Resp Fx  BRIEF PATIENT DESCRIPTION: 72 y/o M, complex medical hx, who presented to [facility] ER 7/30 with AMS, hypercarbic respiratory fx.  PCCM called for admit.    SIGNIFICANT EVENTS / STUDIES:  7/10-7/19:  Admit for small R occipital lobe infarct, CHF, COPD ........................................................................................................ 7/30 - Admit with Hypercarbic Resp Fx  LINES / TUBES: piv  CULTURES: none  ANTIBIOTICS: fortaz (empiric asp) 7/30>>>  SUBJECTIVE:  Still has cough, but feels breathing is improving.  VITAL SIGNS: Temp:  [97.7 F (36.5 C)-98.2 F (36.8 C)] 97.8 F (36.6 C) (08/01 1413) Pulse Rate:  [60-73] 73 (08/01 1413) Resp:  [16-22] 22 (08/01 1413) BP: (137-170)/(55-84) 163/61 mmHg (08/01 1413) SpO2:  [96 %-100 %] 96 % (08/01 1413) Weight:  [121 lb 4.1 oz (55 kg)] 121 lb 4.1 oz (55 kg) (08/01 0614) 2.5 liters  INTAKE / OUTPUT: Intake/Output     07/31 0701 - 08/01 0700 08/01 0701 - 08/02 0700   P.O. 480 240   I.V. (mL/kg) 250 (4.5)    IV Piggyback 100    Total Intake(mL/kg) 830 (15.1) 240 (4.4)   Urine (mL/kg/hr) 100 (0.1) 150 (0.3)   Total Output 100 150   Net +730 +90        Urine Occurrence 1 x 1 x   Stool Occurrence 2 x 1 x     PHYSICAL EXAMINATION: General: Ill appearing, no distress Neuro: Alert, follows commands HEENT: no sinus tenderness Cardiovascular: regular Lungs: scattered rhonchi, no wheeze Musculoskeletal: no edema Skin: multiple ecchymoses  LABS: CBC Recent Labs     01/18/13  1239  01/20/13  0820  WBC  11.5*  15.0*  HGB  10.4*  9.5*  HCT  34.7*  29.7*  PLT  261  251    Coag's Recent Labs     01/18/13  1239  INR  1.30    BMET Recent Labs     01/18/13  1239  01/19/13  0429   01/20/13  0615  NA  133*  137  136  K  4.6  3.5  3.6  CL  87*  89*  88*  CO2  39*  39*  38*  BUN  21  20  32*  CREATININE  1.45*  1.29  1.38*  GLUCOSE  145*  147*  133*    Electrolytes Recent Labs     01/18/13  1239  01/19/13  0429  01/20/13  0615  CALCIUM  10.1  9.0  8.4  MG   --   1.7   --     ABG Recent Labs     01/18/13  1125  PHART  7.207*  PCO2ART  111.4*  PO2ART  108.0*    Liver Enzymes Recent Labs     01/18/13  1239  AST  36  ALT  32  ALKPHOS  92  BILITOT  0.2*  ALBUMIN  3.4*    Cardiac Enzymes Recent Labs     01/18/13  1240  TROPONINI  <0.30  PROBNP  17087.0*   Imaging No results found.     ASSESSMENT / PLAN:  Acute on chronic respiratory failure 2nd to PNA (?aspiration) with ES-COPD. P: -continue scheduled BD's, spiriva and pulmicort -change solumedrol to 20 mg q12h on 8/01 -f/u CXR  8/02 -oxygen to keep SpO2 > 90% -Day 3/x fortaz  Hx of CAD, chronic systolic CHF (EF 35 to 40%), PVD, HTN, hyperlipidemia, PAF. P: -continue digoxin, lasix, hydralazine, imdur, metoprolol, eliquis per cardiology  Stage III CKD. P: -f/u renal fx, urine outpt, electrolytes  Anemia of chronic disease. P: -f/u CBC  Hx of hypothyroidism after thyroidectomy for thyroid cancer. 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 Acute encephalopathy 2nd to respiratory failure >> improved. P: -monitor clinically  Goals of care >> palliative care consulted. P: -DNR/DNI -palliative care working with family >> daughter [name] is HCPOA, and out of country >> not available until 8/05 to participate in discussions   Coralyn Helling, MD [facility] Pulmonary/Critical Care 01/20/2013, 4:40 PM Pager:  [phone] After 3pm call: [phone]

## 2013-01-20 NOTE — Progress Notes (Signed)
[facility]  Patient Status: Active (receiving services up to time of hospitalization)  [facility] is providing the following services: RN  If patient discharges after hours, please call [phone].   [name] [name] 01/20/2013, 3:58 PM

## 2013-01-20 NOTE — Progress Notes (Signed)
Subjective:  SOB improved. He feels he is back at his baseline. Wgt down 4 lbs  Objective:  Vital Signs in the last 24 hours: Temp:  [97.7 F (36.5 C)-98.9 F (37.2 C)] 98 F (36.7 C) (08/01 0932) Pulse Rate:  [60-78] 60 (08/01 0932) Resp:  [17-22] 20 (08/01 0932) BP: (115-170)/(41-84) 165/58 mmHg (08/01 0932) SpO2:  [97 %-100 %] 99 % (08/01 0932) Weight:  [121 lb 4.1 oz (55 kg)-121 lb 11.1 oz (55.2 kg)] 121 lb 4.1 oz (55 kg) (08/01 4696)  Intake/Output from previous day:  Intake/Output Summary (Last 24 hours) at 01/20/13 0951 Last data filed at 01/20/13 0810  Gross per 24 hour  Intake    730 ml  Output    250 ml  Net    480 ml    Physical Exam: General appearance: alert, cooperative, cachectic and no distress Lungs: decreased breatrh sounds Lt 1/4 Heart: regular rate and rhythm Extremities: no edema   Rate: 60  Rhythm: normal sinus rhythm and premature ventricular contractions (PVC)  Lab Results:  Recent Labs  01/18/13 1239 01/20/13 0820  WBC 11.5* 15.0*  HGB 10.4* 9.5*  PLT 261 251    Recent Labs  01/19/13 0429 01/20/13 0615  NA 137 136  K 3.5 3.6  CL 89* 88*  CO2 39* 38*  GLUCOSE 147* 133*  BUN 20 32*  CREATININE 1.29 1.38*    Recent Labs  01/18/13 1240  TROPONINI <0.30   Hepatic Function Panel  Recent Labs  01/18/13 1239  PROT 7.3  ALBUMIN 3.4*  AST 36  ALT 32  ALKPHOS 92  BILITOT 0.2*   No results found for this basename: CHOL,  in the last 72 hours  Recent Labs  01/18/13 1239  INR 1.30    Imaging: Imaging results have been reviewed  Cardiac Studies:  Assessment/Plan:   Principal Problem:   Acute and chronic respiratory failure Active Problems:   COPD (chronic obstructive pulmonary disease)   Nosocomial pneumonia   Dysphagia, post thyroid surgery, possible aspiration pneumonia   Protein-calorie malnutrition, severe   Acute pulmonary edema   Somnolence, daytime, secondary to elevated CO2    PLAN:  He seems to be  at baseline from CHF standpoint. He is still on IV steroids and antibiotics for pneumonia.  He will need close follow up as an OP, Judie Grieve knows him well. His cardiologist is Dr Allyson Sabal, primary MD Dr Ricki Miller. Check BNP in am.  Corine Shelter PA-C Beeper [phone] 01/20/2013, 9:51 AM   I have seen and examined the patient along with Corine Shelter, PA.  I have reviewed the chart, notes and new data.  I agree with PA's note.  Appears clinically euvolemic, but very tenuous balance, very frail. No changes recommended. Not hyponatremic this time, as he had been on several prior admissions. No changes recommended to cardiac meds at this time.  Thurmon Fair, MD, [facility] [phone] 01/20/2013, 2:07 PM

## 2013-01-21 ENCOUNTER — Inpatient Hospital Stay (HOSPITAL_COMMUNITY): Payer: Medicare Other

## 2013-01-21 ENCOUNTER — Encounter (HOSPITAL_COMMUNITY): Payer: Self-pay | Admitting: Cardiology

## 2013-01-21 DIAGNOSIS — Z515 Encounter for palliative care: Secondary | ICD-10-CM

## 2013-01-21 DIAGNOSIS — C73 Malignant neoplasm of thyroid gland: Secondary | ICD-10-CM

## 2013-01-21 LAB — BASIC METABOLIC PANEL
BUN: 32 mg/dL — ABNORMAL HIGH (ref 6–23)
CO2: 40 mEq/L (ref 19–32)
Calcium: 7.7 mg/dL — ABNORMAL LOW (ref 8.4–10.5)
Chloride: 87 mEq/L — ABNORMAL LOW (ref 96–112)
Creatinine, Ser: 1.37 mg/dL — ABNORMAL HIGH (ref 0.50–1.35)
GFR calc Af Amer: 58 mL/min — ABNORMAL LOW (ref >90)
GFR calc non Af Amer: 50 mL/min — ABNORMAL LOW (ref >90)
Glucose, Bld: 98 mg/dL (ref 70–99)
Potassium: 3 mEq/L — ABNORMAL LOW (ref 3.5–5.1)
Sodium: 138 mEq/L (ref 135–145)

## 2013-01-21 LAB — CBC
HCT: 27 % — ABNORMAL LOW (ref 39.0–52.0)
Hemoglobin: 8.6 g/dL — ABNORMAL LOW (ref 13.0–17.0)
MCH: 25.3 pg — ABNORMAL LOW (ref 26.0–34.0)
MCHC: 31.9 g/dL (ref 30.0–36.0)
MCV: 79.4 fL (ref 78.0–100.0)
Platelets: 229 10*3/uL (ref 150–400)
RBC: 3.4 MIL/uL — ABNORMAL LOW (ref 4.22–5.81)
RDW: 19.1 % — ABNORMAL HIGH (ref 11.5–15.5)
WBC: 13.7 10*3/uL — ABNORMAL HIGH (ref 4.0–10.5)

## 2013-01-21 LAB — PRO B NATRIURETIC PEPTIDE: Pro B Natriuretic peptide (BNP): 25393 pg/mL — ABNORMAL HIGH (ref 0–125)

## 2013-01-21 MED ORDER — POTASSIUM CHLORIDE CRYS ER 20 MEQ PO TBCR
20.0000 meq | EXTENDED_RELEASE_TABLET | Freq: Every day | ORAL | Status: DC
  Administered 2013-01-22 – 2013-01-26 (×5): 20 meq via ORAL
  Filled 2013-01-21 (×5): qty 1

## 2013-01-21 MED ORDER — POTASSIUM CHLORIDE CRYS ER 20 MEQ PO TBCR
40.0000 meq | EXTENDED_RELEASE_TABLET | Freq: Once | ORAL | Status: AC
  Administered 2013-01-21: 40 meq via ORAL
  Filled 2013-01-21: qty 2

## 2013-01-21 MED ORDER — PANTOPRAZOLE SODIUM 40 MG PO TBEC
40.0000 mg | DELAYED_RELEASE_TABLET | Freq: Two times a day (BID) | ORAL | Status: DC
  Administered 2013-01-21 – 2013-01-26 (×10): 40 mg via ORAL
  Filled 2013-01-21 (×10): qty 1

## 2013-01-21 MED ORDER — SPIRONOLACTONE 12.5 MG HALF TABLET
12.5000 mg | ORAL_TABLET | Freq: Every day | ORAL | Status: DC
  Administered 2013-01-21 – 2013-01-26 (×6): 12.5 mg via ORAL
  Filled 2013-01-21 (×6): qty 1

## 2013-01-21 NOTE — Progress Notes (Signed)
Patient ZO:XWRUEAV HORALD BIRKY      DOB: 21-Feb-1941      WUJ:[id number]  Summary of Goals of Care; full note to follow  Met with son Time, caregiver/granddaugher [name],  Sister Myriam Jacobson, [place] Sarah   Patient able to participate to some degree.  Excessively fatigued and was sleeping when I arrived.  Subsequently was able to talk with him about his current health issues.  Dr. Herbie Baltimore present see patient and added to the conversation.  Patient hates coming back and forth to the hospital but won't do want family asks to keep himself healthy- not using oxygen as needed, drinking and smoking.   Currently, patients POA in [place], she called in briefly, affirmed code status DNR.  All agreed to start dialogue about patients choice.  Family wants to optimize  Current curative treatment with the intent to address next steps at the begriming of the week when POA returns.  Family concerned about disposition as they are finding it harder to care for him.   Will continue dialogue as patient being treated for pneumonia and chf exacerbation (mild).    Recommend:  1.  DNR  2.  Pneumonia: continue to treat with antibiotics and aggressive pulmonary toilet.  3.  CHF: continue to treat CHF, and hypokalemia  4.  Prognosis is overall poor.  He may meet hospice criteria if he decides on limiting care.   Total time 142- 230 pm  Abdulkarim Eberlin L. Ladona Ridgel, MD MBA The Palliative Medicine Team at [facility] Phone: [phone] Pager: [phone]

## 2013-01-21 NOTE — Consult Note (Signed)
Patient [name] [name]      DOB: 1941-03-20      WUJ:[id number]     Consult Note from the Palliative Medicine Team at [facility]    Consult Requested by: Dr. Delford Field     PCP: [name], MD Reason for Consultation: Goals of care    Phone [phone]  Assessment of patients Current state: Patient is a 72 year old white male with a known history of chronic systolic and diastolic heart failure with an EF of 30-35%, advanced COPD which is O2 dependent although the patient is not adhered to his medical regimen. Patient was found somnolent and brought to the emergency room. He is diagnosed with possible pneumonia with a hypercarbic respiratory failure and mild CHF. He is currently being treated for both. I met with the patient's son and caregiver who is his granddaughter as well as both to the power of attorney his daughter was out of town until Tuesday. At this time the family desires to initiate a gentle conversation about his adherence to medical therapy. He realizes that if he continues to choose activities which will impair his health that they've been forced to look more towards comfort care but at this time he would like to continued curative care while conversations were had about future hospitalizations and transition to comfort. Preferably they would like to wait until the patient's daughter comes him to have further conversations regarding transitions of care. They recognize that the patient be stabilized before that time. We will therefore attempt to assist in continuing goals of care at the time of transition to home.   Goals of Care: 1.  Code Status: affirmed DO NOT RESUSCITATE/ DO NOT INTUBATE   2. Scope of Treatment: Family desires to continued curative treatment at this time which in loops antibiotic therapy, diuretics, thyroid replacement.    4. Disposition: Likely to home with maximized home health services versus home with hospice depending on goals of care  conversation   3. Symptom Management:  1. COPD exacerbation continue treatment of pneumonia and aggressive pulmonary toilet smoking cessation counseling has been undertaken.  2. Dyspnea related to mild congestive heart failure. Cardiology treating with diuretics.  3. Altered mental status secondary to #1 and #2 improved.  4. Recent total thyroidectomy can continue Synthroid and consider adding back Os-Cal see Dr. Suszanne Conners note 4/28     4. Psychosocial: Patient is described as a very positive individual who often will not express when he is feeling poorly. Currently he lives with his granddaughter [name] [name] and is nonadherent to his current medical regime  5. Spiritual: Patient understands the tablets he is available        Patient Documents Completed or Given: Document Given Completed  Advanced Directives Pkt    MOST    DNR    Gone from My Sight    Hard Choices      Brief HPI: Patient is a 72 year old white male with known COPD, and heart disease consistent with paroxysmal issue fibrillation with a recent stroke, chronic systolic and diastolic heart failure with an EF of 30-35%. The patient was feeling well on Monday and Tuesday of this week subsequently he did not follow his medical regimen he partook of an alcoholic beverage was not using his oxygen. The patient was subsequently found somnolent and unarousable. He was brought to the hospital by EMS at the request of his granddaughter who is his primary caregiver. We are asked to assist in starting a conversation about goals of care as is  patient has been in and out of the hospital for multiple times. Patient has no chronic illness which is affecting his activities of daily living. Per the family he is difficulty adhering to the medical regimen and reportedly is intermittently drinking alcohol and smoking cigarettes.   ROS: Currently reports all is well. Denies dyspnea chest pain anorexia nausea vomiting or diarrhea    PMH:  Past  Medical History  Diagnosis Date  . Hypertension   . Hyperlipidemia   . Peripheral vascular disease     Hx of Ax-fem BPG, known LCCA disease  . CHF (congestive heart failure)     Ischemic cardiomyopathy EF 30 - 35%  . Cancer     skin cancer, Thyroid cancer  . Stroke 2004    minor  . Dysrhythmia     PAF  . COPD (chronic obstructive pulmonary disease)     PT DENIES USES O2 2 LITERS HS  . Heart murmur     Mild-moderate MR  . Hepatitis     AT AGE  25; unknown type    . Heart attack     03/17/12; subtotally occluded RCI with unsucessful PCI  . Coronary artery disease     Cardiologist is Dr. Nanetta Batty   . Thyroid cancer     Papillary  . Renal insufficiency   . Somnolence, daytime, secondary to elevated CO2 01/16/2013     PSH: Past Surgical History  Procedure Laterality Date  . Bypass graft  07/2010    axillobifemoral BPG  . Spine surgery  11/23/06    microdiscectomy L5-S1  . Hemorrhoid surgery    . Tonsillectomy    . Hernia repair  02/09/11    Ventral  . Cardiac catheterization      2013  . Coronary angioplasty      2013  . Thyroidectomy Bilateral 10/07/2012    Procedure: THYROIDECTOMY;  Surgeon: Darletta Moll, MD;  Location: [facility] OR;  Service: ENT;  Laterality: Bilateral;  . Radical neck dissection Left 10/07/2012    Procedure: RADICAL NECK DISSECTION;  Surgeon: Darletta Moll, MD;  Location: [facility] OR;  Service: ENT;  Laterality: Left;   I have reviewed the FH and SH and  If appropriate update it with new information. Allergies  Allergen Reactions  . Ceclor (Cefaclor) Anaphylaxis    Recently took pcn for tooth problems, without issues  . Morphine And Related Other (See Comments)    Abnormal behavior   Scheduled Meds: . albuterol  2.5 mg Nebulization Q6H  . apixaban  2.5 mg Oral BID  . budesonide  0.25 mg Nebulization Q6H  . cefTAZidime (FORTAZ)  IV  1 g Intravenous Q12H  . Chlorhexidine Gluconate Cloth  6 each Topical Q0600  . digoxin  0.0625 mg Oral Daily  . furosemide   40 mg Oral BID  . hydrALAZINE  75 mg Oral BID  . isosorbide mononitrate  30 mg Oral Daily  . levothyroxine  125 mcg Oral QAC breakfast  . methylPREDNISolone (SOLU-MEDROL) injection  20 mg Intravenous Q12H  . metoprolol succinate  25 mg Oral Daily  . mupirocin ointment  1 application Nasal BID  . pantoprazole  40 mg Oral BID AC  . [START ON 01/22/2013] potassium chloride  20 mEq Oral Daily  . simvastatin  20 mg Oral q1800  . spironolactone  12.5 mg Oral Daily  . tiotropium  18 mcg Inhalation Daily   Continuous Infusions: . dextrose 5 % and 0.45% NaCl 1,000 mL (01/19/13 1441)  PRN Meds:.albuterol, morphine injection    BP 147/57  Pulse 67  Temp(Src) 97.9 F (36.6 C) (Oral)  Resp 20  Ht 5\' 8"  (1.727 m)  Wt 54.5 kg (120 lb 2.4 oz)  BMI 18.27 kg/m2  SpO2 99%   PPS: 40-50%   Intake/Output Summary (Last 24 hours) at 01/21/13 1453 Last data filed at 01/21/13 0502  Gross per 24 hour  Intake 821.17 ml  Output    850 ml  Net -28.83 ml   LBM: 01/21/2013                      Physical Exam:  General: Sleepy but arousable able to have a congenial conversation. Patient shows obvious avoidance of difficult topics. Answers questions with a broad positive angle. HEENT:  Pupils are equal round reactive to light, extra ocular muscles are intact, mucous membranes are moist, and neck is supple Chest:   Decreased with some basilar wheezing bilaterally some crackles at the bases bilaterally CVS: regular positive S1 and S2 I don't appreciate an S3 or S4 Abdomen: Soft nontender nondistended positive bowel sounds Ext: Warm no lesions no edema Neuro: Awake alert oriented cranial nerves II through XII appear to be intact. Patient appears to have capacity for decision making  Labs: CBC    Component Value Date/Time   WBC 13.7* 01/21/2013 0610   RBC 3.40* 01/21/2013 0610   HGB 8.6* 01/21/2013 0610   HCT 27.0* 01/21/2013 0610   PLT 229 01/21/2013 0610   MCV 79.4 01/21/2013 0610   MCH 25.3* 01/21/2013  0610   MCHC 31.9 01/21/2013 0610   RDW 19.1* 01/21/2013 0610   LYMPHSABS 0.6* 01/20/2013 0820   MONOABS 0.3 01/20/2013 0820   EOSABS 0.0 01/20/2013 0820   BASOSABS 0.0 01/20/2013 0820      CMP     Component Value Date/Time   NA 138 01/21/2013 0610   K 3.0* 01/21/2013 0610   CL 87* 01/21/2013 0610   CO2 40* 01/21/2013 0610   GLUCOSE 98 01/21/2013 0610   BUN 32* 01/21/2013 0610   CREATININE 1.37* 01/21/2013 0610   CREATININE 1.33 01/13/2013 1514   CALCIUM 7.7* 01/21/2013 0610   PROT 7.3 01/18/2013 1239   ALBUMIN 3.4* 01/18/2013 1239   AST 36 01/18/2013 1239   ALT 32 01/18/2013 1239   ALKPHOS 92 01/18/2013 1239   BILITOT 0.2* 01/18/2013 1239   GFRNONAA 50* 01/21/2013 0610   GFRAA 58* 01/21/2013 0610    Chest Xray Reviewed/Impressions: Stable mild patchy pneumonia in the right upper lobe superimposed  upon chronic mild interstitial pulmonary edema. Stable small  bilateral pleural effusions, left greater than right, and  associated passive atelectasis in the left lower lobe.        Time In Time Out Total Time Spent with Patient Total Overall Time  1:42 PM   2:30 PM   20 minutes   48 minutes     Greater than 50%  of this time was spent counseling and coordinating care related to the above assessment and plan.  Discuss with Dr. Annabell Sabal L. Ladona Ridgel, MD MBA The Palliative Medicine Team at [facility] Phone: [phone] Pager: [phone]

## 2013-01-21 NOTE — Progress Notes (Signed)
PULMONARY  / CRITICAL CARE MEDICINE  Name: [name] [name] MRN: [id number] DOB: 1940-10-30    ADMISSION DATE:  01/18/2013  REFERRING MD :  Dr. Denton Lank - EDP PRIMARY SERVICE: PCCM  CHIEF COMPLAINT:  AMS, Acute Resp Fx  BRIEF PATIENT DESCRIPTION: 72 y/o M, complex medical hx, who presented to [facility] ER 7/30 with AMS, hypercarbic respiratory fx.  PCCM called for admit/ NCB status  SIGNIFICANT EVENTS / STUDIES:  7/10-7/19:  Admit for small R occipital lobe infarct, CHF, COPD ........................................................................................................ 7/30 - Admit with Hypercarbic Resp Fx  LINES / TUBES: piv  CULTURES: none  ANTIBIOTICS: fortaz (empiric asp) 7/30>>>  SUBJECTIVE:  Still has cough, very hoarse.  VITAL SIGNS: Temp:  [97.8 F (36.6 C)-98.9 F (37.2 C)] 97.9 F (36.6 C) (08/02 0500) Pulse Rate:  [62-78] 67 (08/02 1036) Resp:  [19-22] 20 (08/02 0500) BP: (147-165)/(57-65) 147/57 mmHg (08/02 1036) SpO2:  [96 %-99 %] 99 % (08/02 0500) Weight:  [120 lb 2.4 oz (54.5 kg)] 120 lb 2.4 oz (54.5 kg) (08/02 0500) 2.5 liters  INTAKE / OUTPUT: Intake/Output     08/01 0701 - 08/02 0700 08/02 0701 - 08/03 0700   P.O. 698    I.V. (mL/kg) 263.2 (4.8)    IV Piggyback 100    Total Intake(mL/kg) 1061.2 (19.5)    Urine (mL/kg/hr) 1000 (0.8)    Total Output 1000     Net +61.2          Urine Occurrence 1 x    Stool Occurrence 2 x      PHYSICAL EXAMINATION: General: Ill appearing, no distress but prominent pseudowheeze Neuro: Alert, follows commands HEENT: no sinus tenderness Cardiovascular: regular Lungs: scattered rhonchi exp bilaterally  Musculoskeletal: no edema Skin: multiple ecchymoses  LABS: CBC Recent Labs     01/20/13  0820  01/21/13  0610  WBC  15.0*  13.7*  HGB  9.5*  8.6*  HCT  29.7*  27.0*  PLT  251  229    Coag's No results found for this basename: APTT, INR,  in the last 72 hours  BMET Recent Labs     01/19/13  0429  01/20/13  0615  01/21/13  0610  NA  137  136  138  K  3.5  3.6  3.0*  CL  89*  88*  87*  CO2  39*  38*  40*  BUN  20  32*  32*  CREATININE  1.29  1.38*  1.37*  GLUCOSE  147*  133*  98    Electrolytes Recent Labs     01/19/13  0429  01/20/13  0615  01/21/13  0610  CALCIUM  9.0  8.4  7.7*  MG  1.7   --    --     ABG No results found for this basename: PHART, PCO2ART, PO2ART,  in the last 72 hours  Liver Enzymes No results found for this basename: AST, ALT, ALKPHOS, BILITOT, ALBUMIN,  in the last 72 hours  Cardiac Enzymes Recent Labs     01/21/13  0610  PROBNP  25393.0*   Imaging Dg Chest Port 1 View  01/21/2013   *RADIOLOGY REPORT*  Clinical Data: Productive cough.  Follow up pneumonia.  PORTABLE CHEST - 1 VIEW  Comparison: Portable chest x-ray 01/18/2013.  Two-view chest x-ray 12/31/2012.  Findings: Cardiac silhouette moderately enlarged but stable.  Mild chronic interstitial pulmonary edema, essentially unchanged since the 07/12 examination.  Patchy airspace opacity in the right upper lobe not significantly  changed since yesterday.  Stable chronic small bilateral pleural effusions, left greater than right.  Stable passive atelectasis in the left lower lobe.  No new pulmonary parenchymal abnormalities.  IMPRESSION: Stable mild patchy pneumonia in the right upper lobe superimposed upon chronic mild interstitial pulmonary edema.  Stable small bilateral pleural effusions, left greater than right, and associated passive atelectasis in the left lower lobe.   Original Report Authenticated By: Hulan Saas, M.D.       ASSESSMENT / PLAN:  Acute on chronic respiratory failure 2nd to PNA (?aspiration) with ES-COPD. P: -continue scheduled BD's, spiriva and pulmicort -changed solumedrol to 20 mg q12h on 8/01  -oxygen to keep SpO2 > 90% - Fortaz per dashboard above  Hx of CAD, chronic systolic CHF (EF 35 to 40%), PVD, HTN, hyperlipidemia, PAF. P: -continue digoxin,  lasix, hydralazine, imdur, metoprolol, eliquis per cardiology  Stage III CKD. Lab Results  Component Value Date   CREATININE 1.37* 01/21/2013   CREATININE 1.38* 01/20/2013   CREATININE 1.29 01/19/2013   CREATININE 1.33 01/13/2013    P: -f/u renal fx  Anemia of chronic disease.  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 Acute encephalopathy 2nd to respiratory failure >> improved. P: -monitor clinically  Goals of care >> palliative care consulted. P: -DNR/DNI -palliative care working with family >> daughter [name] is HCPOA, and out of [place] >> not available until 8/05 to participate in discussions    Sandrea Hughs, MD Pulmonary and Critical Care Medicine [facility] Cell [phone] After 5:30 PM or weekends, call [phone]

## 2013-01-21 NOTE — Progress Notes (Addendum)
CRITICAL VALUE ALERT  Critical value received:  0915  Date of notification:01/21/13  Time of notification: 0915   Critical value read back:yes CO2 level 40  Nurse who received alert:  Joycie Peek  MD notified (1st page):  [phone]  Time of first page:  0925  MD notified (2nd page):  Time of second page:  Responding MD:  Corine Shelter PA  Time MD responded: [phone]

## 2013-01-21 NOTE — Progress Notes (Signed)
CXR completed at bedside.  Occasional cough noted.

## 2013-01-21 NOTE — Progress Notes (Signed)
Subjective:  He says he feels good, no increased SOB.  Objective:  Vital Signs in the last 24 hours: Temp:  [97.8 F (36.6 C)-98.9 F (37.2 C)] 97.9 F (36.6 C) (08/02 0500) Pulse Rate:  [61-78] 62 (08/02 0500) Resp:  [16-22] 20 (08/02 0500) BP: (137-165)/(59-68) 150/59 mmHg (08/02 0500) SpO2:  [96 %-99 %] 99 % (08/02 0500) Weight:  [120 lb 2.4 oz (54.5 kg)] 120 lb 2.4 oz (54.5 kg) (08/02 0500)  Intake/Output from previous day:  Intake/Output Summary (Last 24 hours) at 01/21/13 1013 Last data filed at 01/21/13 0502  Gross per 24 hour  Intake 1061.17 ml  Output    850 ml  Net 211.17 ml    Physical Exam: General appearance: alert, cooperative, cachectic and no distress Lungs: decreased breath sounds  Heart: regular rate and rhythm   Rate: 64  Rhythm: normal sinus rhythm  Lab Results:  Recent Labs  01/20/13 0820 01/21/13 0610  WBC 15.0* 13.7*  HGB 9.5* 8.6*  PLT 251 229    Recent Labs  01/20/13 0615 01/21/13 0610  NA 136 138  K 3.6 3.0*  CL 88* 87*  CO2 38* 40*  GLUCOSE 133* 98  BUN 32* 32*  CREATININE 1.38* 1.37*    Recent Labs  01/18/13 1240  TROPONINI <0.30   Hepatic Function Panel  Recent Labs  01/18/13 1239  PROT 7.3  ALBUMIN 3.4*  AST 36  ALT 32  ALKPHOS 92  BILITOT 0.2*   No results found for this basename: CHOL,  in the last 72 hours  Recent Labs  01/18/13 1239  INR 1.30    Imaging: Imaging results have been reviewed  Cardiac Studies:  Assessment/Plan:   Principal Problem:   Acute and chronic respiratory failure Active Problems:   Acute on chronic combined systolic and diastolic congestive heart failure- EF of 30-35% via echo in 4/14   COPD (chronic obstructive pulmonary disease)   COPD exacerbation   CAD, unsuccessful RCA PCI Sept 2013   Chronic renal disease, stage III   Nosocomial pneumonia   Dysphagia, post thyroid surgery, possible aspiration pneumonia   PVD, Lt Ax-fem and fem-fem BPG 2/12   HTN  (hypertension)   Dyslipidemia   History of CVA (cerebrovascular accident)   PAF, post MI 9/13   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 BNP 25k, not sure this is a reliable marker in this pt.  [name] Shelter PA-C Beeper [phone] 01/21/2013, 10:13 AM  I have seen and evaluated the patient this PM along with [name] Shelter, PA. I agree with his findings, examination as well as impression recommendations. ProBNP is clearly not an accurate assessment tool for him.  Appears clinically euvolemic, but very tenuous balance, very frail. No changes recommended. Not hyponatremic this time, as he had been on several prior admissions. No changes recommended to cardiac meds at this time, but twice a day low-dose Aldactone is not unreasonable, we'll start at 12.5 mg   MD Time with pt: 10 min  [name] [name], M.D., M.S. [facility] [address] [place], [place]  [address]  [phone] Pager # [phone] 01/21/2013 1:39 PM

## 2013-01-21 NOTE — Progress Notes (Signed)
Pt assessment completed, VSS, c/o back pain 6/10, Morphine 2mg  IV given with relieve, no distress noticed. Bed alarm in place for fall precaution, pt assisted as needed to bathroom. We'll continue with POC.

## 2013-01-22 DIAGNOSIS — I2589 Other forms of chronic ischemic heart disease: Secondary | ICD-10-CM

## 2013-01-22 NOTE — Progress Notes (Signed)
Subjective:  Seems comfortable this am. His son is here today.  Objective:  Vital Signs in the last 24 hours: Temp:  [97.9 F (36.6 C)-98.1 F (36.7 C)] 98.1 F (36.7 C) (08/03 0502) Pulse Rate:  [72-78] 72 (08/03 0502) Resp:  [20] 20 (08/03 0502) BP: (110-159)/(58-83) 159/58 mmHg (08/03 0502) SpO2:  [96 %-100 %] 96 % (08/03 0502) Weight:  [122 lb 9.6 oz (55.611 kg)] 122 lb 9.6 oz (55.611 kg) (08/03 0502)  Intake/Output from previous day:  Intake/Output Summary (Last 24 hours) at 01/22/13 1053 Last data filed at 01/22/13 0600  Gross per 24 hour  Intake    658 ml  Output   1425 ml  Net   -767 ml    Physical Exam: General appearance: alert, cooperative, cachectic and no distress Lungs: decreased breath sounds Rt > Lt base Heart: regular rate and rhythm   Rate: 74  Rhythm: normal sinus rhythm  Lab Results:  Recent Labs  01/20/13 0820 01/21/13 0610  WBC 15.0* 13.7*  HGB 9.5* 8.6*  PLT 251 229    Recent Labs  01/20/13 0615 01/21/13 0610  NA 136 138  K 3.6 3.0*  CL 88* 87*  CO2 38* 40*  GLUCOSE 133* 98  BUN 32* 32*  CREATININE 1.38* 1.37*    Imaging: Dg Chest Port 1 View  01/21/2013   *RADIOLOGY REPORT*  Clinical Data: Productive cough.  Follow up pneumonia.  PORTABLE CHEST - 1 VIEW  Comparison: Portable chest x-ray 01/18/2013.  Two-view chest x-ray 12/31/2012.  Findings: Cardiac silhouette moderately enlarged but stable.  Mild chronic interstitial pulmonary edema, essentially unchanged since the 07/12 examination.  Patchy airspace opacity in the right upper lobe not significantly changed since yesterday.  Stable chronic small bilateral pleural effusions, left greater than right.  Stable passive atelectasis in the left lower lobe.  No new pulmonary parenchymal abnormalities.  IMPRESSION: Stable mild patchy pneumonia in the right upper lobe superimposed upon chronic mild interstitial pulmonary edema.  Stable small bilateral pleural effusions, left greater than  right, and associated passive atelectasis in the left lower lobe.   Original Report Authenticated By: Hulan Saas, M.D.    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 Croitoru & Tomma Lightning  Quintella Baton [phone] 01/22/2013, 10:53 AM  I have seen and evaluated the patient this PM along with Corine Shelter, PA. I agree with his findings, examination as well as impression recommendations.  Very stable from a Cardiology perspective   At th is point, he remains tenuous, but is his usual positive self. Does not take much to destabilize him.   MD Time with pt: 5 min  Telly Jawad W, M.D., M.S. [facility] [address] [place], [place]  [address]  [phone] Pager # [phone] 01/22/2013 12:54 PM

## 2013-01-22 NOTE — Progress Notes (Signed)
Patient NW:GNFAOZH GRAHM ETSITTY      DOB: [birth year]      YQM:[id number]   Palliative Medicine Team at [facility] Progress Note    Subjective:  [name] [name] was up in the chair in no acute distress.  He continues to be congenial but lacks depths in his understanding of his illness. He states he has no pain, and his breathing is good. Filed Vitals:   01/22/13 2121  BP: 149/37  Pulse: 65  Temp: 97.8 F (36.6 C)  Resp: 19   Physical exam: General : no acute distress, oriented to self PERRL, EOMI,  Noted left thyroid/neck with significant swelling and slight erythema not hot to touch or tender. Chest : decreased without wheezing or crackles at this time CVS: bradycardic , S1, S2 no murmur Ext: warm, no mottling Neuro: awake alert oriented to self .     Assessment and plan: 72 yr old white male with COPD, chronic systolic and diastolic heart failure. Admitted with probable pneumonia and chf exacerbation. Goals at this time are to maximize medical therapy and begin a conversation about his chronic illnesses.  Family recognizes that he desires to be noncompliant with his vices and they are aware how they are hurting his health.  They understand that hospice is availalble should they decided to move in a comfort pathway.  1.  DNR  2.  Dyspnea : currently corrected and controlled.  Will continue to talk with patient .  Daughter to arrives home early this week.  3.  Thyroid cancer: residual disease involving the left carotic .  Will check with CCM regarding current swelling.Consider talking with Dr. Suszanne Conners.  Patient had been on iodine restricted diet in preparation for treatment per son [name]. I have resumed his Rocalcitrol, and os cal in light of his low calcium 7.7.  Discussed with his son [name].  Total time 15 min  Aysia Lowder L. Ladona Ridgel, MD MBA The Palliative Medicine Team at [facility] Phone: [phone] Pager: [phone]

## 2013-01-22 NOTE — Progress Notes (Signed)
PULMONARY  / CRITICAL CARE MEDICINE  Name: [name] [name] MRN: [id number] DOB: Nov 04, 1940    ADMISSION DATE:  01/18/2013  REFERRING MD :  Dr. Denton Lank - EDP PRIMARY SERVICE: PCCM  CHIEF COMPLAINT:  AMS, Acute Resp Fx  BRIEF PATIENT DESCRIPTION: 72 y/o M, complex medical hx, who presented to [facility] ER 7/30 with AMS, hypercarbic respiratory fx.  PCCM called for admit/ NCB status  SIGNIFICANT EVENTS / STUDIES:  7/10-7/19:  Admit for small R occipital lobe infarct, CHF, COPD ........................................................................................................ 7/30 - Admit with Hypercarbic Resp Fx  LINES / TUBES: piv  CULTURES: none  ANTIBIOTICS: fortaz (empiric asp) 7/30>>>  SUBJECTIVE:  amb in room but still very hoarse  VITAL SIGNS: Temp:  [97.9 F (36.6 C)-98.1 F (36.7 C)] 98.1 F (36.7 C) (08/03 0502) Pulse Rate:  [72-78] 72 (08/03 0502) Resp:  [20] 20 (08/03 0502) BP: (110-159)/(58-83) 159/58 mmHg (08/03 0502) SpO2:  [96 %-100 %] 96 % (08/03 0502) Weight:  [122 lb 9.6 oz (55.611 kg)] 122 lb 9.6 oz (55.611 kg) (08/03 0502) 02 rx  2.5 liters NP  INTAKE / OUTPUT: Intake/Output     08/02 0701 - 08/03 0700 08/03 0701 - 08/04 0700   P.O. 778 360   I.V. (mL/kg)     IV Piggyback     Total Intake(mL/kg) 778 (14) 360 (6.5)   Urine (mL/kg/hr) 1425 (1.1)    Total Output 1425     Net -647 +360        Urine Occurrence 1 x    Stool Occurrence 1 x      PHYSICAL EXAMINATION: General: Ill appearing, no distress but prominent pseudowheeze Neuro: Alert, follows commands HEENT: no sinus tenderness Cardiovascular: regular Lungs: scattered rhonchi exp bilaterally  Musculoskeletal: no edema Skin: multiple ecchymoses  LABS: CBC Recent Labs     01/20/13  0820  01/21/13  0610  WBC  15.0*  13.7*  HGB  9.5*  8.6*  HCT  29.7*  27.0*  PLT  251  229    Coag's No results found for this basename: APTT, INR,  in the last 72 hours  BMET Recent Labs   01/20/13  0615  01/21/13  0610  NA  136  138  K  3.6  3.0*  CL  88*  87*  CO2  38*  40*  BUN  32*  32*  CREATININE  1.38*  1.37*  GLUCOSE  133*  98    Electrolytes Recent Labs     01/20/13  0615  01/21/13  0610  CALCIUM  8.4  7.7*    ABG No results found for this basename: PHART, PCO2ART, PO2ART,  in the last 72 hours  Liver Enzymes No results found for this basename: AST, ALT, ALKPHOS, BILITOT, ALBUMIN,  in the last 72 hours  Cardiac Enzymes Recent Labs     01/21/13  0610  PROBNP  25393.0*   Imaging Dg Chest Port 1 View  01/21/2013   *RADIOLOGY REPORT*  Clinical Data: Productive cough.  Follow up pneumonia.  PORTABLE CHEST - 1 VIEW  Comparison: Portable chest x-ray 01/18/2013.  Two-view chest x-ray 12/31/2012.  Findings: Cardiac silhouette moderately enlarged but stable.  Mild chronic interstitial pulmonary edema, essentially unchanged since the 07/12 examination.  Patchy airspace opacity in the right upper lobe not significantly changed since yesterday.  Stable chronic small bilateral pleural effusions, left greater than right.  Stable passive atelectasis in the left lower lobe.  No new pulmonary parenchymal abnormalities.  IMPRESSION: Stable mild  patchy pneumonia in the right upper lobe superimposed upon chronic mild interstitial pulmonary edema.  Stable small bilateral pleural effusions, left greater than right, and associated passive atelectasis in the left lower lobe.   Original Report Authenticated By: Hulan Saas, M.D.       ASSESSMENT / PLAN:  Acute on chronic respiratory failure 2nd to PNA (?aspiration) with ES-COPD with ? Component vcd P: -continue scheduled BD's, spiriva and pulmicort -changed solumedrol to 20 mg q12h on 8/01  -oxygen to keep SpO2 > 90% - Fortaz per dashboard above - max gerd rx  Hx of CAD, chronic systolic CHF (EF 35 to 40%), PVD, HTN, hyperlipidemia, PAF. P: -continue digoxin, lasix, hydralazine, imdur, metoprolol, eliquis per  cardiology  Stage III CKD. Lab Results  Component Value Date   CREATININE 1.37* 01/21/2013   CREATININE 1.38* 01/20/2013   CREATININE 1.29 01/19/2013   CREATININE 1.33 01/13/2013    P: -f/u renal fx  Anemia of chronic disease.  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 Acute encephalopathy 2nd to respiratory failure >> improved. P: -monitor clinically  Goals of care >> palliative care consulted. P: -DNR/DNI -palliative care working with family >> daughter [name] is HCPOA, and [place] >> not available until 8/05 to participate in discussions      Sandrea Hughs, MD Pulmonary and Critical Care Medicine [facility] Cell [phone] After 5:30 PM or weekends, call [phone]

## 2013-01-22 NOTE — Progress Notes (Signed)
Pt resting comfortable on bed, VSS, no distress noticed. We'll continue with POC.

## 2013-01-23 ENCOUNTER — Ambulatory Visit: Payer: Medicare Other | Admitting: Cardiology

## 2013-01-23 DIAGNOSIS — I70219 Atherosclerosis of native arteries of extremities with intermittent claudication, unspecified extremity: Secondary | ICD-10-CM

## 2013-01-23 DIAGNOSIS — I739 Peripheral vascular disease, unspecified: Secondary | ICD-10-CM

## 2013-01-23 DIAGNOSIS — F17200 Nicotine dependence, unspecified, uncomplicated: Secondary | ICD-10-CM

## 2013-01-23 LAB — BASIC METABOLIC PANEL
BUN: 31 mg/dL — ABNORMAL HIGH (ref 6–23)
CO2: 39 mEq/L — ABNORMAL HIGH (ref 19–32)
Calcium: 6.6 mg/dL — ABNORMAL LOW (ref 8.4–10.5)
Chloride: 87 mEq/L — ABNORMAL LOW (ref 96–112)
Creatinine, Ser: 1.29 mg/dL (ref 0.50–1.35)
GFR calc Af Amer: 63 mL/min — ABNORMAL LOW (ref >90)
GFR calc non Af Amer: 54 mL/min — ABNORMAL LOW (ref >90)
Glucose, Bld: 95 mg/dL (ref 70–99)
Potassium: 3.5 mEq/L (ref 3.5–5.1)
Sodium: 134 mEq/L — ABNORMAL LOW (ref 135–145)

## 2013-01-23 MED ORDER — CALCIUM CARBONATE 1250 (500 CA) MG PO TABS
1.0000 | ORAL_TABLET | Freq: Three times a day (TID) | ORAL | Status: DC
  Administered 2013-01-23 – 2013-01-26 (×14): 500 mg via ORAL
  Filled 2013-01-23 (×17): qty 1

## 2013-01-23 MED ORDER — CALCITRIOL 0.5 MCG PO CAPS
0.5000 ug | ORAL_CAPSULE | Freq: Every day | ORAL | Status: DC
  Administered 2013-01-23 – 2013-01-26 (×4): 0.5 ug via ORAL
  Filled 2013-01-23 (×4): qty 1

## 2013-01-23 NOTE — Progress Notes (Signed)
PULMONARY  / CRITICAL CARE MEDICINE  Name: [name] [name] MRN: [id number] DOB: 1941-01-21    ADMISSION DATE:  01/18/2013  REFERRING MD :  Dr. Denton Lank - EDP PRIMARY SERVICE: PCCM  CHIEF COMPLAINT:  AMS, Acute Resp Fx  BRIEF PATIENT DESCRIPTION: 72 y/o M, complex medical hx, who presented to [facility] ER 7/30 with AMS, hypercarbic respiratory fx.  PCCM called for admit/ NCB status  SIGNIFICANT EVENTS / STUDIES:  7/10-7/19:  Admit for small R occipital lobe infarct, CHF, COPD ........................................................................................................ 7/30 - Admit with Hypercarbic Resp Fx  LINES / TUBES: piv  CULTURES: none  ANTIBIOTICS: fortaz (empiric asp) 7/30>>> plan d/c 8/5  SUBJECTIVE:  Feels that breathing may be a little better  VITAL SIGNS: Temp:  [97.8 F (36.6 C)-99 F (37.2 C)] 98.5 F (36.9 C) (08/04 0604) Pulse Rate:  [60-67] 67 (08/04 0951) Resp:  [19-22] 20 (08/04 0951) BP: (141-158)/(37-69) 141/68 mmHg (08/04 0951) SpO2:  [96 %-100 %] 98 % (08/04 0951) Weight:  [54.6 kg (120 lb 5.9 oz)] 54.6 kg (120 lb 5.9 oz) (08/04 0604) 02 rx  2.5 liters NP  INTAKE / OUTPUT: Intake/Output     08/03 0701 - 08/04 0700 08/04 0701 - 08/05 0700   P.O. 900    Total Intake(mL/kg) 900 (16.5)    Urine (mL/kg/hr) 1200 (0.9)    Total Output 1200     Net -300          Urine Occurrence 1 x      PHYSICAL EXAMINATION: General: Ill appearing, no distress Neuro: Alert, follows commands HEENT: no sinus tenderness Cardiovascular: regular Lungs: scattered rhonchi exp bilaterally, harsh cough Musculoskeletal: no edema Skin: multiple ecchymoses  LABS: CBC Recent Labs     01/21/13  0610  WBC  13.7*  HGB  8.6*  HCT  27.0*  PLT  229    Coag's No results found for this basename: APTT, INR,  in the last 72 hours  BMET Recent Labs     01/21/13  0610  01/23/13  0500  NA  138  134*  K  3.0*  3.5  CL  87*  87*  CO2  40*  39*  BUN  32*  31*   CREATININE  1.37*  1.29  GLUCOSE  98  95    Electrolytes Recent Labs     01/21/13  0610  01/23/13  0500  CALCIUM  7.7*  6.6*    ABG No results found for this basename: PHART, PCO2ART, PO2ART,  in the last 72 hours  Liver Enzymes No results found for this basename: AST, ALT, ALKPHOS, BILITOT, ALBUMIN,  in the last 72 hours  Cardiac Enzymes Recent Labs     01/21/13  0610  PROBNP  25393.0*   Imaging No results found.     ASSESSMENT / PLAN:  Acute on chronic respiratory failure 2nd to PNA (?aspiration) with ES-COPD with ? Component vcd P: -continue scheduled BD's, spiriva and pulmicort -changed solumedrol to 20 mg q12h on 8/01  -oxygen to keep SpO2 > 90% - Fortaz per dashboard above - max gerd rx  Hx of CAD, chronic systolic CHF (EF 35 to 40%), PVD, HTN, hyperlipidemia, PAF. P: -continue digoxin, lasix, hydralazine, imdur, metoprolol, eliquis per cardiology  Stage III CKD. Lab Results  Component Value Date   CREATININE 1.29 01/23/2013   CREATININE 1.37* 01/21/2013   CREATININE 1.38* 01/20/2013   CREATININE 1.33 01/13/2013    P: -f/u renal fx  Anemia of chronic disease.  Recent Labs  Lab 01/18/13 1239 01/20/13 0820 01/21/13 0610  HGB 10.4* 9.5* 8.6*    P: -f/u CBC  Hx of hypothyroidism after thyroidectomy for thyroid cancer. Residual thyroid disease present > had been on iodine restricted diet in prep for treatment P: -continue synthroid -rocalcitrol and Ca++ restarted by Dr Ladona Ridgel 8/4 -will need to review status with patient, discuss whether thyroid treatment tolerable, indicated   Recent CVA July 2014. Acute encephalopathy 2nd to respiratory failure >> improved. P: -monitor clinically  Goals of care >>  P: -DNR/DNI -palliative care working with family >> daughter [name] [name] is HCPOA, and out of country >> not available until 8/05 to participate in discussions. Discussions have been held with the patient and his son [name] [name].    Levy Pupa, MD,  PhD 01/23/2013, 2:24 PM [place] [facility] [phone] or if no answer [phone]

## 2013-01-23 NOTE — Progress Notes (Signed)
Subjective: No complaints of SOB or pain anywhere.  Objective: Vital signs in last 24 hours: Temp:  [97.8 F (36.6 C)-99 F (37.2 C)] 98.5 F (36.9 C) (08/04 0604) Pulse Rate:  [60-67] 67 (08/04 0951) Resp:  [19-22] 20 (08/04 0951) BP: (141-158)/(37-69) 141/68 mmHg (08/04 0951) SpO2:  [96 %-100 %] 98 % (08/04 0951) Weight:  [120 lb 5.9 oz (54.6 kg)] 120 lb 5.9 oz (54.6 kg) (08/04 0604) Last BM Date: 01/21/13  Intake/Output from previous day: 08/03 0701 - 08/04 0700 In: 900 [P.O.:900] Out: 1200 [Urine:1200] Intake/Output this shift:    Medications Current Facility-Administered Medications  Medication Dose Route Frequency Provider Last Rate Last Dose  . albuterol (PROVENTIL) (5 MG/ML) 0.5% nebulizer solution 2.5 mg  2.5 mg Nebulization Q6H Jeanella Craze, NP   2.5 mg at 01/23/13 0904  . albuterol (PROVENTIL) (5 MG/ML) 0.5% nebulizer solution 2.5 mg  2.5 mg Nebulization Q3H PRN Jeanella Craze, NP   2.5 mg at 01/20/13 1501  . apixaban (ELIQUIS) tablet 2.5 mg  2.5 mg Oral BID Storm Frisk, MD   2.5 mg at 01/23/13 0953  . budesonide (PULMICORT) nebulizer solution 0.25 mg  0.25 mg Nebulization Q6H Jeanella Craze, NP   0.25 mg at 01/23/13 0904  . calcitRIOL (ROCALTROL) capsule 0.5 mcg  0.5 mcg Oral Daily Renae Gloss, MD   0.5 mcg at 01/23/13 0953  . calcium carbonate (OS-CAL - dosed in mg of elemental calcium) tablet 500 mg of elemental calcium  1 tablet Oral TID WC & HS Renae Gloss, MD   500 mg of elemental calcium at 01/23/13 0955  . cefTAZidime (FORTAZ) 1 g in dextrose 5 % 50 mL IVPB  1 g Intravenous Q12H Nelda Bucks, MD   1 g at 01/23/13 0959  . dextrose 5 %-0.45 % sodium chloride infusion   Intravenous Continuous Storm Frisk, MD 10 mL/hr at 01/19/13 1441 1,000 mL at 01/19/13 1441  . digoxin (LANOXIN) tablet 0.0625 mg  0.0625 mg Oral Daily Storm Frisk, MD   0.0625 mg at 01/23/13 0981  . furosemide (LASIX) tablet 40 mg  40 mg Oral BID Storm Frisk, MD   40 mg at 01/23/13 0954  . hydrALAZINE (APRESOLINE) tablet 75 mg  75 mg Oral BID Storm Frisk, MD   75 mg at 01/23/13 0954  . isosorbide mononitrate (IMDUR) 24 hr tablet 30 mg  30 mg Oral Daily Storm Frisk, MD   30 mg at 01/23/13 0954  . levothyroxine (SYNTHROID, LEVOTHROID) tablet 125 mcg  125 mcg Oral QAC breakfast Storm Frisk, MD   125 mcg at 01/23/13 [id number]  . methylPREDNISolone sodium succinate (SOLU-MEDROL) 40 mg/mL injection 20 mg  20 mg Intravenous Q12H Coralyn Helling, MD   20 mg at 01/23/13 7829  . metoprolol succinate (TOPROL-XL) 24 hr tablet 25 mg  25 mg Oral Daily Storm Frisk, MD   25 mg at 01/23/13 0955  . morphine 2 MG/ML injection 2-4 mg  2-4 mg Intravenous Q3H PRN Jeanella Craze, NP   2 mg at 01/21/13 2126  . pantoprazole (PROTONIX) EC tablet 40 mg  40 mg Oral BID AC Nyoka Cowden, MD   40 mg at 01/23/13 5621  . potassium chloride SA (K-DUR,KLOR-CON) CR tablet 20 mEq  20 mEq Oral Daily Abelino Derrick, PA-C   20 mEq at 01/23/13 0955  . simvastatin (ZOCOR) tablet 20 mg  20 mg Oral q1800 Luisa Hart  Vivi Martens, MD   20 mg at 01/22/13 1740  . spironolactone (ALDACTONE) tablet 12.5 mg  12.5 mg Oral Daily Marykay Lex, MD   12.5 mg at 01/23/13 0953  . tiotropium (SPIRIVA) inhalation capsule 18 mcg  18 mcg Inhalation Daily Storm Frisk, MD   18 mcg at 01/23/13 2841    PE: General appearance: alert, cooperative, no distress and He was sleeping sitting up when I knocked. Lungs: clear to auscultation bilaterally Heart: regular rate and rhythm, S1, S2 normal, no murmur, click, rub or gallop Extremities: No LEE Pulses: 2+ and symmetric Skin: Warm and dry Neurologic: Grossly normal  Lab Results:   Recent Labs  01/21/13 0610  WBC 13.7*  HGB 8.6*  HCT 27.0*  PLT 229   BMET  Recent Labs  01/21/13 0610 01/23/13 0500  NA 138 134*  K 3.0* 3.5  CL 87* 87*  CO2 40* 39*  GLUCOSE 98 95  BUN 32* 31*  CREATININE 1.37* 1.29  CALCIUM 7.7* 6.6*      Assessment/Plan  Principal Problem:   Acute and chronic respiratory failure Active Problems:   COPD (chronic obstructive pulmonary disease)   Acute on chronic combined systolic and diastolic congestive heart failure- EF of 30-35% via echo in 4/14   PVD, Lt Ax-fem and fem-fem BPG 2/12   HTN (hypertension)   Dyslipidemia   History of CVA (cerebrovascular accident)   PAF, post MI 9/13   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 No acute distress.   Calcium is low.  Back on Rocalcitrol.    Palliative med following.  Stable from cardiac standpoint.    LOS: 5 days    HAGER, BRYAN 01/23/2013 10:03 AM  I have seen and examined the patient along with Wilburt Finlay, PA.  I have reviewed the chart, notes and new data.  I agree with PA/NP's note.  Key new complaints: asleep, but easily arousable and mostly oriented. Looks chronically ill and very frail, but no acute distress. Key examination changes: cachexia, clear lungs Key new findings / data: renal function stable/actually a little better; Na borderline low  PLAN: No changes in meds. Palliative care is appropriate at this point, I think. Daughter flying back in to [facility] today.  Thurmon Fair, MD, [facility] [phone] 01/23/2013, 2:22 PM

## 2013-01-23 NOTE — Progress Notes (Signed)
Pt resting on bed comfortable, pain medications given as needed with relieve, VSS, no distress noticed. We'll continue with POC.

## 2013-01-23 NOTE — Progress Notes (Signed)
ANTIBIOTIC CONSULT NOTE - FOLLOW UP  Pharmacy Consult for Ceftazidime Indication: pneumonia  Allergies  Allergen Reactions  . Ceclor (Cefaclor) Anaphylaxis    Recently took pcn for tooth problems, without issues  . Morphine And Related Other (See Comments)    Abnormal behavior    Patient Measurements: Height: 5\' 8"  (172.7 cm) Weight: 120 lb 5.9 oz (54.6 kg) IBW/kg (Calculated) : 68.4  Vital Signs: Temp: 98.5 F (36.9 C) (08/04 0604) Temp src: Oral (08/04 0604) BP: 141/68 mmHg (08/04 0951) Pulse Rate: 67 (08/04 0951) Intake/Output from previous day: 08/03 0701 - 08/04 0700 In: 900 [P.O.:900] Out: 1200 [Urine:1200] Intake/Output from this shift:    Labs:  Recent Labs  01/21/13 0610 01/23/13 0500  WBC 13.7*  --   HGB 8.6*  --   PLT 229  --   CREATININE 1.37* 1.29   Estimated Creatinine Clearance: 40.6 ml/min (by C-G formula based on Cr of 1.29).  Microbiology: Recent Results (from the past 720 hour(s))  CULTURE, BLOOD (ROUTINE X 2)     Status: None   Collection Time    12/29/12  3:57 PM      Result Value Range Status   Specimen Description BLOOD HAND RIGHT   Final   Special Requests BOTTLES DRAWN AEROBIC ONLY 2.5CC   Final   Culture  Setup Time 12/29/2012 23:13   Final   Culture NO GROWTH 5 DAYS   Final   Report Status 01/04/2013 FINAL   Final  CULTURE, BLOOD (ROUTINE X 2)     Status: None   Collection Time    12/29/12  4:03 PM      Result Value Range Status   Specimen Description BLOOD ARM LEFT   Final   Special Requests BOTTLES DRAWN AEROBIC ONLY 10CC   Final   Culture  Setup Time 12/29/2012 23:13   Final   Culture NO GROWTH 5 DAYS   Final   Report Status 01/04/2013 FINAL   Final  MRSA PCR SCREENING     Status: Abnormal   Collection Time    01/18/13  2:59 PM      Result Value Range Status   MRSA by PCR POSITIVE (*) NEGATIVE Final   Comment:            The GeneXpert MRSA Assay (FDA     approved for NASAL specimens     only), is one component of a      comprehensive MRSA colonization     surveillance program. It is not     intended to diagnose MRSA     infection nor to guide or     monitor treatment for     MRSA infections.     RESULT CALLED TO, READ BACK BY AND VERIFIED WITH:     L.WORLEY RN AT 2041 BY L.PITT 01/18/13    Anti-infectives   Start     Dose/Rate Route Frequency Ordered Stop   01/19/13 0600  cefTAZidime (FORTAZ) 1 g in dextrose 5 % 50 mL IVPB     1 g 100 mL/hr over 30 Minutes Intravenous Every 12 hours 01/18/13 1647     01/18/13 1700  cefTAZidime (FORTAZ) 2 g in dextrose 5 % 50 mL IVPB     2 g 100 mL/hr over 30 Minutes Intravenous  Once 01/18/13 1624 01/18/13 1840   01/18/13 1500  cefTRIAXone (ROCEPHIN) 1 g in dextrose 5 % 50 mL IVPB  Status:  Discontinued     1 g 100 mL/hr over 30  Minutes Intravenous Every 24 hours 01/18/13 1451 01/18/13 1534      Assessment: 71 YOM with end-stage COPD who was found unresponsive by his granddaughter. He is on day 6 ceftaz for PNA as he was recently released from the hospital for a R occipital lobe infarct. Renal function remains relatively stable with a CrCl ~81mL/min. His WBC have decreased (last checked 8/2) and he remains afebrile. No cultures. He is also on IV steroids.  Goal of Therapy:  Eradication of infection  Plan:  -Continue ceftazidime 1g IV q12h -F/u clinical progression, any cultures, renal function -Consider a stop date after 8 days of treatment  [facility], [address].D., BCPS Clinical Pharmacist Pager: [phone] 01/23/2013 1:37 PM

## 2013-01-24 LAB — BASIC METABOLIC PANEL
BUN: 35 mg/dL — ABNORMAL HIGH (ref 6–23)
Calcium: 6.8 mg/dL — ABNORMAL LOW (ref 8.4–10.5)
GFR calc non Af Amer: 56 mL/min — ABNORMAL LOW (ref >90)
Glucose, Bld: 108 mg/dL — ABNORMAL HIGH (ref 70–99)
Sodium: 134 mEq/L — ABNORMAL LOW (ref 135–145)

## 2013-01-24 LAB — CBC
MCH: 25.1 pg — ABNORMAL LOW (ref 26.0–34.0)
MCHC: 31.6 g/dL (ref 30.0–36.0)
Platelets: 242 10*3/uL (ref 150–400)

## 2013-01-24 MED ORDER — PREDNISONE 20 MG PO TABS
30.0000 mg | ORAL_TABLET | Freq: Every day | ORAL | Status: DC
  Administered 2013-01-25 – 2013-01-26 (×2): 30 mg via ORAL
  Filled 2013-01-24 (×3): qty 1

## 2013-01-24 NOTE — Progress Notes (Signed)
Pt. Seen and examined. Agree with the NP/PA-C note as written.  Agree with palliative care. No further cardiac suggestions at this time. Will sign-off. Call with questions.  Chrystie Nose, MD, [facility] Attending Cardiologist [facility]

## 2013-01-24 NOTE — Progress Notes (Signed)
Patient GN:[id number] [name] [name]      DOB: 1940/07/20      MVH:[id number]  Spoke with patient's daughter who has returned to [place].  Plan to formally remeet tomorrow 8/6 at 11 am to discussion current goals of care.  Adira Limburg L. Ladona Ridgel, MD MBA The Palliative Medicine Team at [facility] Phone: [phone] Pager: [phone]

## 2013-01-24 NOTE — Progress Notes (Signed)
PULMONARY  / CRITICAL CARE MEDICINE  Name: [name] [name] MRN: [id number] DOB: 11/25/1940    ADMISSION DATE:  01/18/2013  REFERRING MD :  [name]. Denton [name] - EDP PRIMARY SERVICE: PCCM  CHIEF COMPLAINT:  AMS, Acute Resp Fx  BRIEF PATIENT DESCRIPTION: 72 y/o M, complex medical hx, who presented to [name] ER 7/30 with AMS, hypercarbic respiratory fx.  PCCM called for admit/ NCB status  SIGNIFICANT EVENTS / STUDIES:  7/10-7/19:  Admit for small R occipital lobe infarct, CHF, COPD ........................................................................................................ 7/30 - Admit with Hypercarbic Resp Fx  LINES / TUBES: piv  CULTURES: none  ANTIBIOTICS: fortaz (empiric asp) 7/30>>> 8/5  SUBJECTIVE:  somnolent today  VITAL SIGNS: Temp:  [97.9 F (36.6 C)-98.2 F (36.8 C)] 97.9 F (36.6 C) (08/05 0605) Pulse Rate:  [56-72] 60 (08/05 0933) Resp:  [19-20] 20 (08/05 0933) BP: (133-148)/(59-74) 139/59 mmHg (08/05 0933) SpO2:  [91 %-100 %] 98 % (08/05 0933) Weight:  [54.159 kg (119 lb 6.4 oz)] 54.159 kg (119 lb 6.4 oz) (08/05 0605) 02 rx  2.5 liters NP  INTAKE / OUTPUT: Intake/Output     08/04 0701 - 08/05 0700 08/05 0701 - 08/06 0700   P.O. 940 240   I.V. (mL/kg) 260 (4.8)    IV Piggyback 50    Total Intake(mL/kg) 1250 (23.1) 240 (4.4)   Urine (mL/kg/hr) 1305 (1)    Total Output 1305     Net -55 +240        Urine Occurrence 3 x    Stool Occurrence 2 x      PHYSICAL EXAMINATION: General: Ill appearing, no distress Neuro: Alert, follows commands HEENT: no sinus tenderness Cardiovascular: regular Lungs: scattered rhonchi exp bilaterally, harsh cough Musculoskeletal: no edema Skin: multiple ecchymoses  LABS: CBC Recent Labs     01/24/13  0722  WBC  9.9  HGB  9.2*  HCT  29.1*  PLT  242    Coag's No results found for this basename: APTT, INR,  in the last 72 hours  BMET Recent Labs     01/23/13  0500  01/24/13  0722  NA  134*  134*  K  3.5   3.5  CL  87*  86*  CO2  39*  40*  BUN  31*  35*  CREATININE  1.29  1.25  GLUCOSE  95  108*    Electrolytes Recent Labs     01/23/13  0500  01/24/13  0722  CALCIUM  6.6*  6.8*    ABG No results found for this basename: PHART, PCO2ART, PO2ART,  in the last 72 hours  Liver Enzymes No results found for this basename: AST, ALT, ALKPHOS, BILITOT, ALBUMIN,  in the last 72 hours  Cardiac Enzymes No results found for this basename: TROPONINI, PROBNP,  in the last 72 hours Imaging No results found.     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 P: -continue digoxin, lasix, hydralazine, imdur, metoprolol, eliquis per cardiology  Stage III CKD. Lab Results  Component Value Date   CREATININE 1.25 01/24/2013  CREATININE 1.29 01/23/2013   CREATININE 1.37* 01/21/2013   CREATININE 1.33 01/13/2013    P: -f/u renal fx  Anemia of chronic disease.  Recent Labs Lab 01/20/13 0820 01/21/13 0610 01/24/13 0722  HGB 9.5* 8.6* 9.2*    P: -f/u CBC  Hx of hypothyroidism after thyroidectomy for thyroid cancer. Residual thyroid disease present > had been on iodine restricted diet in prep for treatment P: -continue synthroid -rocalcitrol and Ca++ restarted by [name] [name] 8/4 -will need to review status with patient, discuss whether thyroid treatment tolerable, indicated >> called [name] [name] 8/5, will discuss. 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 Acute encephalopathy 2nd to respiratory failure >> improved. P: -monitor clinically  Goals of care >>   P: -DNR/DNI -palliative care working with family >> daughter [name] [name] is HCPOA, and out of [place] >> not available until 8/05 to participate in discussions. Discussions have been held with the patient and his son [name] [name]. They are interested in investigating Home Hospice.    Levy Pupa, MD, PhD 01/24/2013, 1:59 PM [facility] [phone] or if no answer [phone]

## 2013-01-24 NOTE — Progress Notes (Signed)
01/24/13-Patient had a critical CO2 this morning of 40. CO2 was 39 on 8/4. Anastasia Fiedler RN.

## 2013-01-24 NOTE — Progress Notes (Signed)
Subjective: Very sleepy, no chest pain no SOB.   Objective: Vital signs in last 24 hours: Temp:  [97.9 F (36.6 C)-98.2 F (36.8 C)] 97.9 F (36.6 C) (08/05 0605) Pulse Rate:  [56-72] 60 (08/05 0933) Resp:  [19-20] 20 (08/05 0933) BP: (133-148)/(59-74) 139/59 mmHg (08/05 0933) SpO2:  [91 %-100 %] 98 % (08/05 0933) Weight:  [119 lb 6.4 oz (54.159 kg)] 119 lb 6.4 oz (54.159 kg) (08/05 0605) Weight change: -15.5 oz (-0.441 kg) Last BM Date: 01/23/13 Intake/Output from previous day: -295  Wt 119.6 down from 125 on admit. 08/04 0701 - 08/05 0700 In: 1250 [P.O.:940; I.V.:260; IV Piggyback:50] Out: 1305 [Urine:1305] Intake/Output this shift: Total I/O In: 240 [P.O.:240] Out: -   PE: General:Pleasant affect, but very sleepy, has mild difficulty responding to questions. Skin:Warm and dry, brisk capillary refill HEENT:normocephalic, sclera clear, mucus membranes moist Heart:S1S2 RRR without murmur, gallup, rub or click, occ premature beat  Lungs:clear without rales, rhonchi, or wheezes ZOX:WRUE, non tender, + BS, do not palpate liver spleen or masses Ext:no lower ext edema,  Neuro:alert and oriented, MAE, follows commands, + facial symmetry   Lab Results:  Recent Labs  01/24/13 0722  WBC 9.9  HGB 9.2*  HCT 29.1*  PLT 242   BMET  Recent Labs  01/23/13 0500 01/24/13 0722  NA 134* 134*  K 3.5 3.5  CL 87* 86*  CO2 39* 40*  GLUCOSE 95 108*  BUN 31* 35*  CREATININE 1.29 1.25  CALCIUM 6.6* 6.8*   No results found for this basename: TROPONINI, CK, MB,  in the last 72 hours  Lab Results  Component Value Date   CHOL 170 01/02/2013   HDL 45 01/02/2013   LDLCALC 108* 01/02/2013   TRIG 86 01/02/2013   CHOLHDL 3.8 01/02/2013   Lab Results  Component Value Date   HGBA1C 5.6 01/02/2013     Lab Results  Component Value Date   TSH 62.027* 12/02/2012      Studies/Results: No results found.  Medications: I have reviewed the patient's current  medications. Scheduled Meds: . albuterol  2.5 mg Nebulization Q6H  . apixaban  2.5 mg Oral BID  . budesonide  0.25 mg Nebulization Q6H  . calcitRIOL  0.5 mcg Oral Daily  . calcium carbonate  1 tablet Oral TID WC & HS  . cefTAZidime (FORTAZ)  IV  1 g Intravenous Q12H  . digoxin  0.0625 mg Oral Daily  . furosemide  40 mg Oral BID  . hydrALAZINE  75 mg Oral BID  . isosorbide mononitrate  30 mg Oral Daily  . levothyroxine  125 mcg Oral QAC breakfast  . methylPREDNISolone (SOLU-MEDROL) injection  20 mg Intravenous Q12H  . metoprolol succinate  25 mg Oral Daily  . pantoprazole  40 mg Oral BID AC  . potassium chloride  20 mEq Oral Daily  . simvastatin  20 mg Oral q1800  . spironolactone  12.5 mg Oral Daily  . tiotropium  18 mcg Inhalation Daily   Continuous Infusions: . dextrose 5 % and 0.45% NaCl 20 mL/hr at 01/23/13 1900   PRN Meds:.albuterol, morphine injection  Assessment/Plan: Principal Problem:   Acute and chronic respiratory failure Active Problems:   Acute on chronic combined systolic and diastolic congestive heart failure- EF of 30-35% via echo in 4/14   PVD, Lt Ax-fem and fem-fem BPG 2/12   HTN (hypertension)   Dyslipidemia   COPD (chronic obstructive pulmonary disease)   History of CVA (cerebrovascular  accident)   PAF, post MI 9/13   Malignant neoplasm of thyroid gland- surgey April 2014   COPD exacerbation   CAD, unsuccessful RCA PCI Sept 2013   Chronic renal disease, stage III   Nosocomial pneumonia   Dysphagia, post thyroid surgery, possible aspiration pneumonia   Protein-calorie malnutrition, severe   Acute pulmonary edema   Somnolence, daytime, secondary to elevated CO2  PLAN: Daughter back today to assist with decisions concerning pallative care.  LOS: 6 days   Time spent with pt. :15 minutes. [facility] R  Nurse Practitioner Certified Pager [phone] 01/24/2013, 10:27 AM

## 2013-01-24 NOTE — Progress Notes (Signed)
Patient [name]      DOB: [birth year]      ZHY:[id number]   Palliative Medicine Team at [facility] Progress Note    Subjective: [name] is more somnolent today .  He states I can't stay awake. Noted that CO2 Elevated today in lab results.  Updated Dr. Delton Coombes.  Talked with Daughter breifly will remeet for goc meeting at 11 am tomorrow.    Filed Vitals:   01/24/13 1429  BP: 148/68  Pulse: 72  Temp:   Resp: 20   Physical exam:  General: somnolent.  Trying to hold conversation but eyes close. Sitting on edge of bed PERRL, EOMI, anicteric, mmm Chest decreased air entry but no wheezing at present CVS: regular rate and rhythm ABd: soft not tender Ext: warm, no edema Neuro: intermittently somnolent , otherwise cognition is intact  Lab Results  Component Value Date   CREATININE 1.25 01/24/2013   BUN 35* 01/24/2013   NA 134* 01/24/2013   K 3.5 01/24/2013   CL 86* 01/24/2013   CO2 40* 01/24/2013   Lab Results  Component Value Date   WBC 9.9 01/24/2013   HGB 9.2* 01/24/2013   HCT 29.1* 01/24/2013   MCV 79.5 01/24/2013   PLT 242 01/24/2013   Assessment and plan: 72 yr old white male with advanced COPD, chronic systolic/diastolic heart failure, admitted with pneumonia and chf excaerbation. Family hesitant to move forward with conversation about confort care until daughter returned from out of the country. Plan to meet 11 am tomorrow. Updated Dr. Delton Coombes on current state and condition.  1.  DNR  2.  Hypercarbic respiratory failure:  Patient relates that he is not adverse to using bipap if this will help him be more comfortable.  Conveyed information to Dr. Delton Coombes  3.  Thyroid cancer with hypocalcium post thyroidectomy: patient with residual disease:  Primary service will address need to have Dr. Suszanne Conners involved.  I will continue to address goals of care in the context of this illness.   Prognosis:  Likely less than 6 months, hospice eligible likely on multiple fronts- cancer, COPD with  respiratory failure  Disposition:   Family struggling to care for him at home but have not expressed ultimate wishes yet . To be addressed in goc.  Total time 15 min  Zamyia Gowell L. Ladona Ridgel, MD MBA The Palliative Medicine Team at [facility] Phone: [phone] Pager: [phone]

## 2013-01-25 ENCOUNTER — Encounter (HOSPITAL_COMMUNITY): Admission: RE | Admit: 2013-01-25 | Payer: Medicare Other | Source: Ambulatory Visit

## 2013-01-25 NOTE — Progress Notes (Signed)
Patient [name]      DOB: [birth year]      ION:[id number]   Palliative Medicine Team at [facility] Progress Note    Subjective: Updated patient's daughter at the bedside.  Discussed treatment options and options for comfort care.  Patient affirmed that he does not wish to continue to come back and forth to the hospital .  He understands what hospice is and was open to accepting hospice care at home.  Daughter agreeable to pursing hospice care.  Discussed options for treatment of his thyroid cancer.  All agreed to put that on hold for now.  Daughter in contact with Dr. Talmage Nap.    Filed Vitals:   01/25/13 2022  BP:   Pulse: 68  Temp:   Resp: 18   Physical exam:  General: still intermittently somnolent, no respiratory distress Perrl, eomi, anicteric mmm, left neck mass unchanged Chest decreased but no wheezing cvs regular rate and rhythm abd soft not tender not disteneded Ext : warm, trace ecchymosis trace nonpitting edema Neuro, oriented but intermittently somnolent  No labs  Assessment and plan: Patient is a 72 year old white male with a known past medical history for advanced COPD, acute on chronic systolic heart failure, recent thyroid cancer resection with residual cancer present, hypercarbic respiratory failure acute on chronic, admitted with altered mental status had been hypercarbic respiratory failure. Have met with the patient's daughter and patient both have elected to transition to hospice care at home. At this time they desire to forego further treatment for the thyroid cancer but reserve the option in the future and we'll make him more comfortable. I believe the patient will benefit from hospice with a diagnosis 6 months or less related to his underlying advanced COPD with hypercarbia.   1. DO NOT RESUSCITATE DO NOT INTUBATE 2. Hypercarbic respiratory failure: The patient may benefit from nocturnal BiPAP/CPAP but he is ambivalent about using this 3.  Hypocalcemia secondary to recent resection of the thyroid gland likely resulting in parathyroid resection as well. Continue oral therapy and recheck calcium. Patient had some cramping in his hands yesterday but is not cramping today. Updated primary team with regard to his recommendation.  Prognosis is likely 6 months or less related to his underlying pulmonary disease. Disposition to home with hospice  30 minutes. [name] Cogan L. Ladona Ridgel, MD MBA The Palliative Medicine Team at [facility] Phone: [phone] Pager: [phone]

## 2013-01-25 NOTE — Care Management Note (Addendum)
  Page 2 of 2   01/25/2013     1:47:46 PM   CARE MANAGEMENT NOTE 01/25/2013  Patient:  [name], [name] [name]   Account Number:  [id number]  Date Initiated:  01/19/2013  Documentation initiated by:  Donn Pierini  Subjective/Objective Assessment:   Pt admitted with acute resp. failure     Action/Plan:   PTA pt lived at home with family, followed by [facility] community CM- PC consulted   Anticipated DC Date:  01/26/2013   Anticipated DC Plan:  HOME W HOSPICE CARE      DC Planning Services  CM consult      PAC Choice  HOSPICE   Choice offered to / List presented to:  C-4 Adult Children   DME arranged  Levan Hurst      DME agency  [facility]        Status of service:  In process, will continue to follow Medicare Important Message given?   (If response is "NO", the following Medicare IM given date fields will be blank) Date Medicare IM given:   Date Additional Medicare IM given:    Discharge Disposition:    Per UR Regulation:  Reviewed for med. necessity/level of care/duration of stay  If discussed at Long Length of Stay Meetings, dates discussed:    Comments:  01/25/13 1330.Marland KitchenMarland KitchenOletta Cohn, RN, BSN, [employer] [phone] Spoke with pt and family at bedside regarding discharge planning for Home Hospice.  Offered daughter list of Home Hospice agencies.  Pt and family chose [facility].  [name], RN of [facility] at bedside and aware of family wishes.  NCM will order home equipment package D with rolling walker.  Please call daughter French Ana) when equipment ready for delivery [phone]).

## 2013-01-25 NOTE — Discharge Summary (Signed)
Physician Discharge Summary  Patient ID: [name] [name] MRN: [id number] DOB/AGE: 1941/02/11 72 y.o.  Admit date: 01/18/2013 Discharge date: 01/26/2013   Discharge Diagnoses:  Principal Problem:   Acute and chronic respiratory failure Active Problems:   COPD (chronic obstructive pulmonary disease)   Acute on chronic combined systolic and diastolic congestive heart failure- EF of 30-35% via echo in 4/14   PVD, Lt Ax-fem and fem-fem BPG 2/12   HTN (hypertension)   Dyslipidemia   History of CVA (cerebrovascular accident)   PAF, post MI 9/13   Malignant neoplasm of thyroid gland- surgey April 2014   COPD exacerbation   CAD, unsuccessful RCA PCI Sept 2013   Chronic renal disease, stage III   Nosocomial pneumonia   Dysphagia, post thyroid surgery, possible aspiration pneumonia   Protein-calorie malnutrition, severe   Acute pulmonary edema   Somnolence, daytime, secondary to elevated CO2    Brief Summary: [name] [name] is a 72 y/o M, continued smoker, with PMH of HTN, Afib, CAD, CKD III, PVD, O2 dependent COPD, widely metastatic Thyroid CA and recent admit to [facility] for AMS (d/c'd 01/07/13). Work up at that time demonstrated small R occipital lobe infarct. Patient is well known to PCCM service, followed by Dr. Delton Coombes and [facility] for CHF / CAD. Granddaughter found him 7/30 altered / lethargic without oxygen in place. EMS attempted to intubate x2 and were unsuccessful.  ER evaluation demonstrated hypercarbic respiratory failure with ABG 7.20 / 111 / 108 / 44, renal insufficiency with sr cr of 1.45, elevated BNP but down from baseline. Placed on bipap and admitted by PCCM.  Followed closely by cardiology as well for acute on chronic combined heart failure.  Now much improved with diuresis, BD, IV steroids, abx.  Met with palliative care this admit to discuss treatment options, goals of care, etc. Now DNR/DNI and plans to dc home with hospice and put thyroid ca treatments on hold indefinitely for now.     SIGNIFICANT EVENTS / STUDIES:  7/10-7/19: Admit for small R occipital lobe infarct, CHF, COPD  ........................................................................................................  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  Discussed nocturnal bipap/CPAP which pt does not want at this time. Treated acutely with abx, IV steroids, BD, diuresis.  Will d/c home on steroid taper and previous BD regimen and O2 at 3L [place]. WIll f/u closely as outpt.  At some point will need to discuss more aggressive symptom management as his disease progresses but he is comfortable as of now.   Acute on chronic combined CHF (EF 35-40%) PAF HTN - followed closely by cardiology. Diuresed aggressively as BP tol.  Volume overload appears improved.  Will d/c home on eliquis, digoxin, lasix, imdur, hydralazine, metoprolol, aldactone per cardiology recs.   Stage III CKD - no acute issue this admit.  PCP  f/u.  Will decrease Potassium to daily with addition aldactone.   Acute encephalopathy - Resolved. R/t Hypercarbic resp failure.   Metastatic Thyroid cancer - will follow with Dr Talmage Nap regarding outpatient radioactive iodine   Filed Vitals:   01/25/13 2022 01/26/13 0513 01/26/13 0736 01/26/13 0900  BP:  137/65  134/68  Pulse: 68 65  62  Temp:  97.9 F (36.6 C)    TempSrc:  Oral    Resp: 18 18  18   Height:      Weight:  121 lb 6.4 oz (55.067 kg)    SpO2: 96% 98% 98% 98%     Discharge Labs  BMET  Recent Labs Lab 01/20/13 0615 01/21/13 0610 01/23/13 0500 01/24/13 0722  NA 136 138 134* 134*  K  3.6 3.0* 3.5 3.5  CL 88* 87* 87* 86*  CO2 38* 40* 39* 40*  GLUCOSE 133* 98 95 108*  BUN 32* 32* 31* 35*  CREATININE 1.38* 1.37* 1.29 1.25  CALCIUM 8.4 7.7* 6.6* 6.8*     CBC   Recent Labs Lab 01/20/13 0820 01/21/13 0610 01/24/13 0722  HGB 9.5* 8.6* 9.2*  HCT 29.7* 27.0* 29.1*  WBC 15.0* 13.7* 9.9  PLT 251 229 242   Anti-Coagulation No results found for this basename: INR,  in the last 168 hours     Future Appointments Provider Department Dept Phone   02/03/2013 8:00 AM Wl-Nm 1 [facility] [phone]   02/15/2013 3:00 PM Leslye Peer, MD [facility] [phone]   03/01/2013 3:00 PM Ronal Fear, NP [facility] [phone]   03/20/2013 2:00 PM Runell Gess, MD [facility] [place] [phone]   05/03/2013 11:00 AM Vvs-Lab Lab 4 [facility] [phone]   05/03/2013 11:40 AM Evern Bio, NP [facility] [phone]          Follow-up Information   Follow up with [name], MD On 01/30/2013. (11:15am )    Contact information:   [address], [address] [place] [address] [phone]       Follow up with Dorisann Frames, MD On 02/14/2013. (labs - for TSH - anytime during the day )    Contact information:   [address] [place] [address] [phone]       Follow up with Leslye Peer., MD On 02/15/2013. (3:00pm )    Contact information:   [address] [place] [address] [phone]       Follow up with [facility] ([facility] to follow aftr d/c pls notify when pt ready to leave unit-call [phone] (or if aftr 5 pm [phone]))    Contact information:   [facility] [address] [place], [place] [phone] /[phone]        Medication List         albuterol (5 MG/ML) 0.5% nebulizer solution  Commonly known as:  PROVENTIL  Take  0.5 mLs (2.5 mg total) by nebulization 4 (four) times daily.     albuterol (5 MG/ML) 0.5% nebulizer solution  Commonly known as:  PROVENTIL  Take 0.5 mLs (2.5 mg total) by nebulization every 3 (three) hours as needed for wheezing or shortness of breath.     apixaban 2.5 MG Tabs tablet  Commonly known as:  ELIQUIS  Take 1 tablet (2.5 mg total) by mouth 2 (two) times daily.     bisacodyl 5 MG EC tablet  Commonly known as:  DULCOLAX  Take 2 tablets (10 mg total) by mouth daily as needed.     budesonide 0.25 MG/2ML nebulizer solution  Commonly known as:  PULMICORT  Take 2 mLs (0.25 mg total) by nebulization every 6 (six) hours.     calcitRIOL 0.5 MCG capsule  Commonly known as:  ROCALTROL  Take 0.5 mcg by mouth daily.     calcium carbonate 1250 MG tablet  Commonly known as:  OS-CAL - dosed in mg of elemental calcium  Take 1 tablet (500 mg of elemental calcium total) by mouth 4 (four) times daily.     Digoxin 62.5 MCG Tabs  Take 0.0625 mg by mouth daily.     furosemide 40 MG tablet  Commonly known as:  LASIX  Take 1 tablet (40 mg total) by mouth 2 (two) times daily.     gabapentin 600 MG tablet  Commonly known as:  NEURONTIN  Take 600 mg by mouth 2 (two) times daily.     hydrALAZINE 25 MG tablet  Commonly known as:  APRESOLINE  Take 3 tablets (75 mg total) by mouth 2 (two) times daily.     isosorbide mononitrate 30 MG 24 hr tablet  Commonly known as:  IMDUR  Take 1 tablet (30 mg total) by mouth daily.     levothyroxine 125 MCG tablet  Commonly known as:  SYNTHROID, LEVOTHROID  Take 125 mcg by mouth daily before breakfast.     metoprolol succinate 25 MG 24 hr tablet  Commonly known as:  TOPROL-XL  Take 25 mg by mouth daily.     nitroGLYCERIN 0.4 MG SL tablet  Commonly known as:  NITROSTAT  Place 1 tablet (0.4 mg total) under the tongue every 5 (five) minutes x 3 doses as needed for chest pain.     pantoprazole 40 MG tablet  Commonly known as:  PROTONIX  Take 40 mg  by mouth daily as needed.     potassium chloride 10 MEQ tablet  Commonly known as:  K-DUR,KLOR-CON  Take 10 mEq by mouth daily.     predniSONE 10 MG tablet  Commonly known as:  DELTASONE  3 tabs PO daily x 3 days then 2 tabs PO daily x 3 days then 1 tab PO daily x 3 days then STOP     senna 8.6 MG Tabs tablet  Commonly known as:  SENOKOT  Take 1 tablet (8.6 mg total) by mouth at bedtime.     simvastatin 20 MG tablet  Commonly known as:  ZOCOR  Take 1 tablet (20 mg total) by mouth daily at 6 PM.     spironolactone 12.5 mg Tabs tablet  Commonly known as:  ALDACTONE  Take 0.5 tablets (12.5 mg total) by mouth daily.     tiotropium 18 MCG inhalation capsule  Commonly known as:  SPIRIVA  Place 1 capsule (18 mcg total) into inhaler and inhale daily.          Disposition: 01-Home or Self Care  Discharged Condition: LEGRANDE HAO has met maximum benefit of inpatient care and is medically stable and cleared for discharge.  Patient is pending follow up as above.      Time spent on disposition:  Greater than 35 minutes.   SignedDanford Bad, NP 01/26/2013  11:18 AM Pager: [phone] or [phone]  *Care during the described time interval was provided by me and/or other providers on the critical care team. I have reviewed this patient's available data, including medical history, events of note,  physical examination and test results as part of my evaluation.    Levy Pupa, MD, PhD 01/26/2013, 12:08 PM [facility] [phone] or if no answer [phone]

## 2013-01-25 NOTE — Progress Notes (Addendum)
Informed patient and family request services of [facility]) after discharge and this was confirmed by [facility]  Patient information reviewed with Dr Kern Reap, [facility] staff physician, hospice diagnosis: COPD Spoke at bedside with patient, daughter [name] and patient's sister, Myriam Jacobson to initiate education related to hospice services, philosophy and team approach to care- Traci shared she is familiar with services from a previous experience with [facility] with her in-laws and is very appreciative of [facility] involvement.   -Per discussion plan is for discharge to home by personal vehicle; family hopeful for discharge tomorrow. - Daughter voiced concern that patient has not gotten up and walked with PT this admission and questions possible eval for transfer & tolerance with O2 and relates a walker would be helpful, as patient had recent falls per daughter Clinical research associate spoke with Dr Delton Coombes who is agreeable with PT eval prior to d/c -Discussed need for travel O2 as patient currently on [facility] continuous  - per daughter patient does have travel O2 tank in the home and she will bring to the hospital  DME has been requested for delivery to the home later today: Complete package D: fully electric hospital bed with half rails, AP&P mattress and over-bed table; please add to this a rolling walker w wheels-  Per patient/daughter currently there is an O2 concentrator, E tanks and nebulizer machine in the home -patient now will require O2 continuous at 3 [facility]   [place], [facility] aware of DME request and will notify [facility] representative *please contact daughter [name] c: [phone] to arrange delivery time    Initial paperwork faxed to [facility]  Please notify [facility] when patient is ready to leave unit at d/c call [phone] (or [phone] if after 5 pm);  [facility] information and contact numbers also given to daughter Traciduring visit.   Above information shared with Durward Mallard,  [facility] Please call with any questions or concerns   [name], RN 01/25/2013, 2:42 PM [facility] Palliative Medicine Team RN Liaison [phone]

## 2013-01-25 NOTE — Progress Notes (Signed)
PULMONARY  / CRITICAL CARE MEDICINE  Name: [name] [name] MRN: [id number] DOB: [birth year]    ADMISSION DATE:  01/18/2013  REFERRING MD :  Dr. Denton Lank - EDP PRIMARY SERVICE: PCCM  CHIEF COMPLAINT:  AMS, Acute Resp Fx  BRIEF PATIENT DESCRIPTION: 72 y/o M, complex medical hx, who presented to [facility] ER 7/30 with AMS, hypercarbic respiratory fx.  PCCM called for admit/ NCB status  SIGNIFICANT EVENTS / STUDIES:  7/10-7/19:  Admit for small R occipital lobe infarct, CHF, COPD ........................................................................................................ 7/30 - Admit with Hypercarbic Resp Fx  LINES / TUBES: piv  CULTURES: none  ANTIBIOTICS: fortaz (empiric asp) 7/30>>> 8/5  SUBJECTIVE:  Feeling much better.   VITAL SIGNS: Temp:  [98.4 F (36.9 C)] 98.4 F (36.9 C) (08/06 0645) Pulse Rate:  [60-86] 65 (08/06 0645) Resp:  [18-20] 18 (08/06 0645) BP: (139-160)/(59-75) 140/62 mmHg (08/06 0645) SpO2:  [95 %-100 %] 97 % (08/06 0751) Weight:  [117 lb 8 oz (53.298 kg)] 117 lb 8 oz (53.298 kg) (08/06 0645) 02 rx  2.5 liters [place]  INTAKE / OUTPUT: Intake/Output     08/05 0701 - 08/06 0700 08/06 0701 - 08/07 0700   P.O. 940 360   I.V. (mL/kg)     IV Piggyback     Total Intake(mL/kg) 940 (17.6) 360 (6.8)   Urine (mL/kg/hr) 1605 (1.3)    Total Output 1605     Net -665 +360        Stool Occurrence 1 x      PHYSICAL EXAMINATION: General: Ill appearing, no distress Neuro: Alert, follows commands HEENT: no sinus tenderness Cardiovascular:s1s2  regular Lungs: resp even, non labored on , diminished bases, otherwise clear  Musculoskeletal: no edema Skin: multiple ecchymoses  LABS: CBC Recent Labs     01/24/13  0722  WBC  9.9  HGB  9.2*  HCT  29.1*  PLT  242    Coag's No results found for this basename: APTT, INR,  in the last 72 hours  BMET Recent Labs     01/23/13  0500  01/24/13  0722  NA  134*  134*  K  3.5  3.5  CL  87*  86*  CO2   39*  40*  BUN  31*  35*  CREATININE  1.29  1.25  GLUCOSE  95  108*    Electrolytes Recent Labs     01/23/13  0500  01/24/13  0722  CALCIUM  6.6*  6.8*    ABG No results found for this basename: PHART, PCO2ART, PO2ART,  in the last 72 hours  Liver Enzymes No results found for this basename: AST, ALT, ALKPHOS, BILITOT, ALBUMIN,  in the last 72 hours  Cardiac Enzymes No results found for this basename: TROPONINI, PROBNP,  in the last 72 hours Imaging No results found.    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  P: - continue scheduled BD's, spiriva and pulmicort - cont PO pred  -oxygen to keep SpO2 > 90% - abx complete   Hx of CAD, chronic systolic CHF (EF 35 to 40%), PVD, HTN, hyperlipidemia, PAF. P: -continue digoxin, lasix, hydralazine, imdur, metoprolol, eliquis per cardiology  Stage III CKD. P: -f/u renal fx  Anemia of chronic disease. 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 Residual thyroid disease present > had been on iodine restricted diet in prep  for treatment P: -continue synthroid -rocalcitrol and Ca++ restarted by Dr Ladona Ridgel 8/4 -will need to review status with patient, discuss whether thyroid treatment tolerable, indicated. 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 Acute encephalopathy 2nd to respiratory failure >> improved. P: -monitor clinically  Goals of care >>  P: -DNR/DNI -palliative care working with family >> family meeting planned 8/6, may be ready for d/c home depending on outcome of family meeting. Will check back.    Danford Bad, NP 01/25/2013  9:32 AM Pager: [phone] or [phone]  *Care during the described time interval was provided by me and/or other providers on the critical care team. I have reviewed this patient's available data,  including medical history, events of note, physical examination and test results as part of my evaluation.    Levy Pupa, MD, PhD 01/25/2013, 2:36 PM [facility] [phone] or if no answer [phone]

## 2013-01-26 ENCOUNTER — Encounter (HOSPITAL_COMMUNITY): Payer: Medicare Other

## 2013-01-26 DIAGNOSIS — G9340 Encephalopathy, unspecified: Secondary | ICD-10-CM

## 2013-01-26 MED ORDER — SPIRONOLACTONE 12.5 MG HALF TABLET: 12.5000 mg | ORAL_TABLET | Freq: Every day | ORAL | Status: AC

## 2013-01-26 MED ORDER — ALBUTEROL SULFATE (5 MG/ML) 0.5% IN NEBU: 2.5000 mg | INHALATION_SOLUTION | Freq: Four times a day (QID) | RESPIRATORY_TRACT | Status: AC

## 2013-01-26 MED ORDER — ALBUTEROL SULFATE (5 MG/ML) 0.5% IN NEBU: 2.5000 mg | INHALATION_SOLUTION | RESPIRATORY_TRACT | Status: AC | PRN

## 2013-01-26 MED ORDER — PREDNISONE 10 MG PO TABS: ORAL_TABLET | ORAL | Status: AC

## 2013-01-26 NOTE — Progress Notes (Signed)
Pt d/c home with daughter. D/c instructions and medications reviewed with PT and daughter. Pt and daughter state understanding. All questions answered.

## 2013-01-26 NOTE — Evaluation (Signed)
Physical Therapy Evaluation Patient Details Name: [name] [name] MRN: [id number] DOB: 02-Jun-1941 Today's Date: 01/26/2013 Time: 1140-1200 PT Time Calculation (min): 20 min  PT Assessment / Plan / Recommendation History of Present Illness  Patient is a 72 year old white male with a known past medical history for advanced COPD, acute on chronic systolic heart failure, recent thyroid cancer resection with residual cancer present, hypercarbic respiratory failure acute on chronic, admitted with altered mental status had been hypercarbic respiratory failure. Have met with the patient's daughter and patient both have elected to transition to hospice care at home. At this time they desire to forego further treatment for the thyroid cancer but reserve the option in the future and we'll make him more comfortable. I believe the patient will benefit from hospice with a diagnosis 6 months or less related to his underlying advanced COPD with hypercarbia.  Clinical Impression  Pt ready to d/c home with hospice services.  Pt safe to d/c home with family transporting per PT standpoint.  Pt able to complete stair negotiation.  Pt has no DME needs at this time for home however Hospice to continue to follow to determine overall pt's needs.  PT will sign off.     PT Assessment  All further PT needs can be met in the next venue of care    Follow Up Recommendations  Other (comment) (Hospice services)    Equipment Recommendations  None recommended by PT    Precautions / Restrictions Precautions Precautions: Fall   Pertinent Vitals/Pain No c/o pain      Mobility  Bed Mobility Bed Mobility: Not assessed Transfers Transfers: Sit to Stand;Stand to Sit Sit to Stand: 6: Modified independent (Device/Increase time);From toilet Stand to Sit: 6: Modified independent (Device/Increase time);To chair/3-in-1 Ambulation/Gait Ambulation/Gait Assistance: 5: Supervision Ambulation Distance (Feet): 200 Feet Assistive  device: None Ambulation/Gait Assistance Details: Supervision for safety Gait Pattern: Within Functional Limits Stairs: Yes Stairs Assistance: 4: Min assist Stairs Assistance Details (indicate cue type and reason): (A) to maintain balance with cues for step sequence Stair Management Technique: One rail Left;Forwards Number of Stairs: 1    Exercises     PT Diagnosis:    PT Problem List:   PT Treatment Interventions:       PT Goals(Current goals can be found in the care plan section) Acute Rehab PT Goals Patient Stated Goal: to go home PT Goal Formulation: No goals set, d/c therapy  Visit Information  Last PT Received On: 01/26/13 History of Present Illness: Patient is a 72 year old white male with a known past medical history for advanced COPD, acute on chronic systolic heart failure, recent thyroid cancer resection with residual cancer present, hypercarbic respiratory failure acute on chronic, admitted with altered mental status had been hypercarbic respiratory failure. Have met with the patient's daughter and patient both have elected to transition to hospice care at home. At this time they desire to forego further treatment for the thyroid cancer but reserve the option in the future and we'll make him more comfortable. I believe the patient will benefit from hospice with a diagnosis 6 months or less related to his underlying advanced COPD with hypercarbia.       Prior Functioning  Home Living Family/patient expects to be discharged to:: Private residence Living Arrangements: Other relatives (neice) Available Help at Discharge: Family Type of Home: House Home Access: Stairs to enter Secretary/administrator of Steps: 1 Entrance Stairs-Rails: None Home Layout: One level Home Equipment: Environmental consultant - 2 wheels;Cane - single point  Prior Function Level of Independence: Independent Comments: states he wears O2 at night Communication Communication: No difficulties Dominant Hand: Right     Cognition  Cognition Arousal/Alertness: Awake/alert Behavior During Therapy: WFL for tasks assessed/performed    Extremity/Trunk Assessment Lower Extremity Assessment Lower Extremity Assessment: Generalized weakness   Balance    End of Session PT - End of Session Equipment Utilized During Treatment: Gait belt;Oxygen (3L (Home O2)) Activity Tolerance: Patient tolerated treatment well Patient left: in bed;with call bell/phone within reach (sitting EOB) Nurse Communication: Mobility status  GP     Raine Blodgett 01/26/2013, 2:24 PM  Jake Shark, PT DPT [phone]

## 2013-01-26 NOTE — Progress Notes (Signed)
Nutrition Brief Note  Chart reviewed. Per chart, pt likely to d/c home today with [facility] services.   No further nutrition interventions warranted at this time.   Consider diet liberalization to Regular to promote quality of life and maximize po choices.  Please re-consult as needed.   Jarold Motto MS, RD, LDN Pager: [phone] After-hours pager: [phone]

## 2013-01-27 ENCOUNTER — Encounter (HOSPITAL_COMMUNITY): Payer: Medicare Other

## 2013-02-03 ENCOUNTER — Encounter (HOSPITAL_COMMUNITY): Payer: Medicare Other

## 2013-02-08 ENCOUNTER — Telehealth: Payer: Self-pay | Admitting: Emergency Medicine

## 2013-02-08 ENCOUNTER — Encounter (HOSPITAL_COMMUNITY): Payer: Medicare Other

## 2013-02-08 NOTE — Telephone Encounter (Signed)
I spoke with hopsice nurse. Pt was found unresponsive by daughter. The hospice nurse tried calling pt this AM to scheduled appt but no answer. The daughter found pt unresponsive. The grand daughter stated she was able to get a few words out of him but was just lethargic. The hospice nurse got there [name] [name]. She stated she asked pt if he wanted to go to the hospital and he stated no. She asked if you wasn't to stay home and he said yes. She asked are you comfortable dying at home and he said yes. She believes he is in CHF. Pt does have DNR in home. [name] [name] crushed up lasix 40 mg gave pt sublingual and pred 20 mg sublingual. Pt just finished taper and stated he gets like this when he comes off. They have called hospice doc and he will write RX for morphine to help calm pt breathing down. When [name] [name] arrive in pt home his O2 sats were 71% not sure what liter flow bc the concentrator not working properly. She placed him on tank 6 liters and sats ranging 92-96% but also falling into the 70's. She also gave pt albuterol [place]. He was also placed on face mask for O2 since he is mouth breathing. He has ronchi left lung. He looked clammy. His BP 158/84. [name] [name] wants to see off RB wants pt to continue pred 20 mg daily and any more on lasix.   I called RB and spoke with him. He stated fine for the prednisone but no more lasix.  I called [name] [name] back [phone] and made her aware. She stated he has plenty of prednisone on hand and does not need RX. Nothing further needed

## 2013-02-09 ENCOUNTER — Ambulatory Visit (HOSPITAL_COMMUNITY): Payer: Medicare Other

## 2013-02-09 ENCOUNTER — Telehealth: Payer: Self-pay | Admitting: Emergency Medicine

## 2013-02-10 ENCOUNTER — Encounter (HOSPITAL_COMMUNITY): Payer: Medicare Other

## 2013-02-15 ENCOUNTER — Inpatient Hospital Stay: Payer: Medicare Other | Admitting: Emergency Medicine

## 2013-02-17 ENCOUNTER — Encounter (HOSPITAL_COMMUNITY): Payer: Medicare Other

## 2013-02-20 NOTE — Telephone Encounter (Signed)
Thank u for letting me know

## 2013-02-20 DEATH — deceased

## 2013-03-01 ENCOUNTER — Ambulatory Visit: Payer: PRIVATE HEALTH INSURANCE | Admitting: Nurse Practitioner

## 2013-03-13 ENCOUNTER — Telehealth: Payer: Self-pay | Admitting: Cardiovascular Disease

## 2013-03-13 NOTE — Telephone Encounter (Signed)
Need the face to face documentation from 12-12-12.If pt was seen  Between 09-13-12 and 01-11-13.Please call if you have any questions. She faxed over this form on -02-21-13.

## 2013-03-13 NOTE — Telephone Encounter (Signed)
Message forwarded to Medical Records to contact [employer] (if neeed) to process ROI.

## 2013-03-20 ENCOUNTER — Ambulatory Visit: Payer: Medicare Other | Admitting: Cardiovascular Disease

## 2013-03-23 ENCOUNTER — Telehealth: Payer: Self-pay | Admitting: *Deleted

## 2013-03-23 NOTE — Telephone Encounter (Signed)
Face to face form filled out for office visit on 12/16/12 and faxed to [facility]

## 2013-05-03 ENCOUNTER — Ambulatory Visit: Payer: Medicare Other | Admitting: Neurosurgery

## 2013-05-26 NOTE — Telephone Encounter (Signed)
States pt has deceased, disregard message

## 2014-05-31 ENCOUNTER — Encounter (HOSPITAL_COMMUNITY): Payer: Self-pay | Admitting: Interventional Cardiology

## 2015-03-02 IMAGING — CR DG CHEST 1V PORT
1 series · 1 of 1 positions shown · non-contrast
Comparison: 05/15/2012

CLINICAL DATA: Shortness of breath.

PORTABLE CHEST - 1 VIEW

[AP]
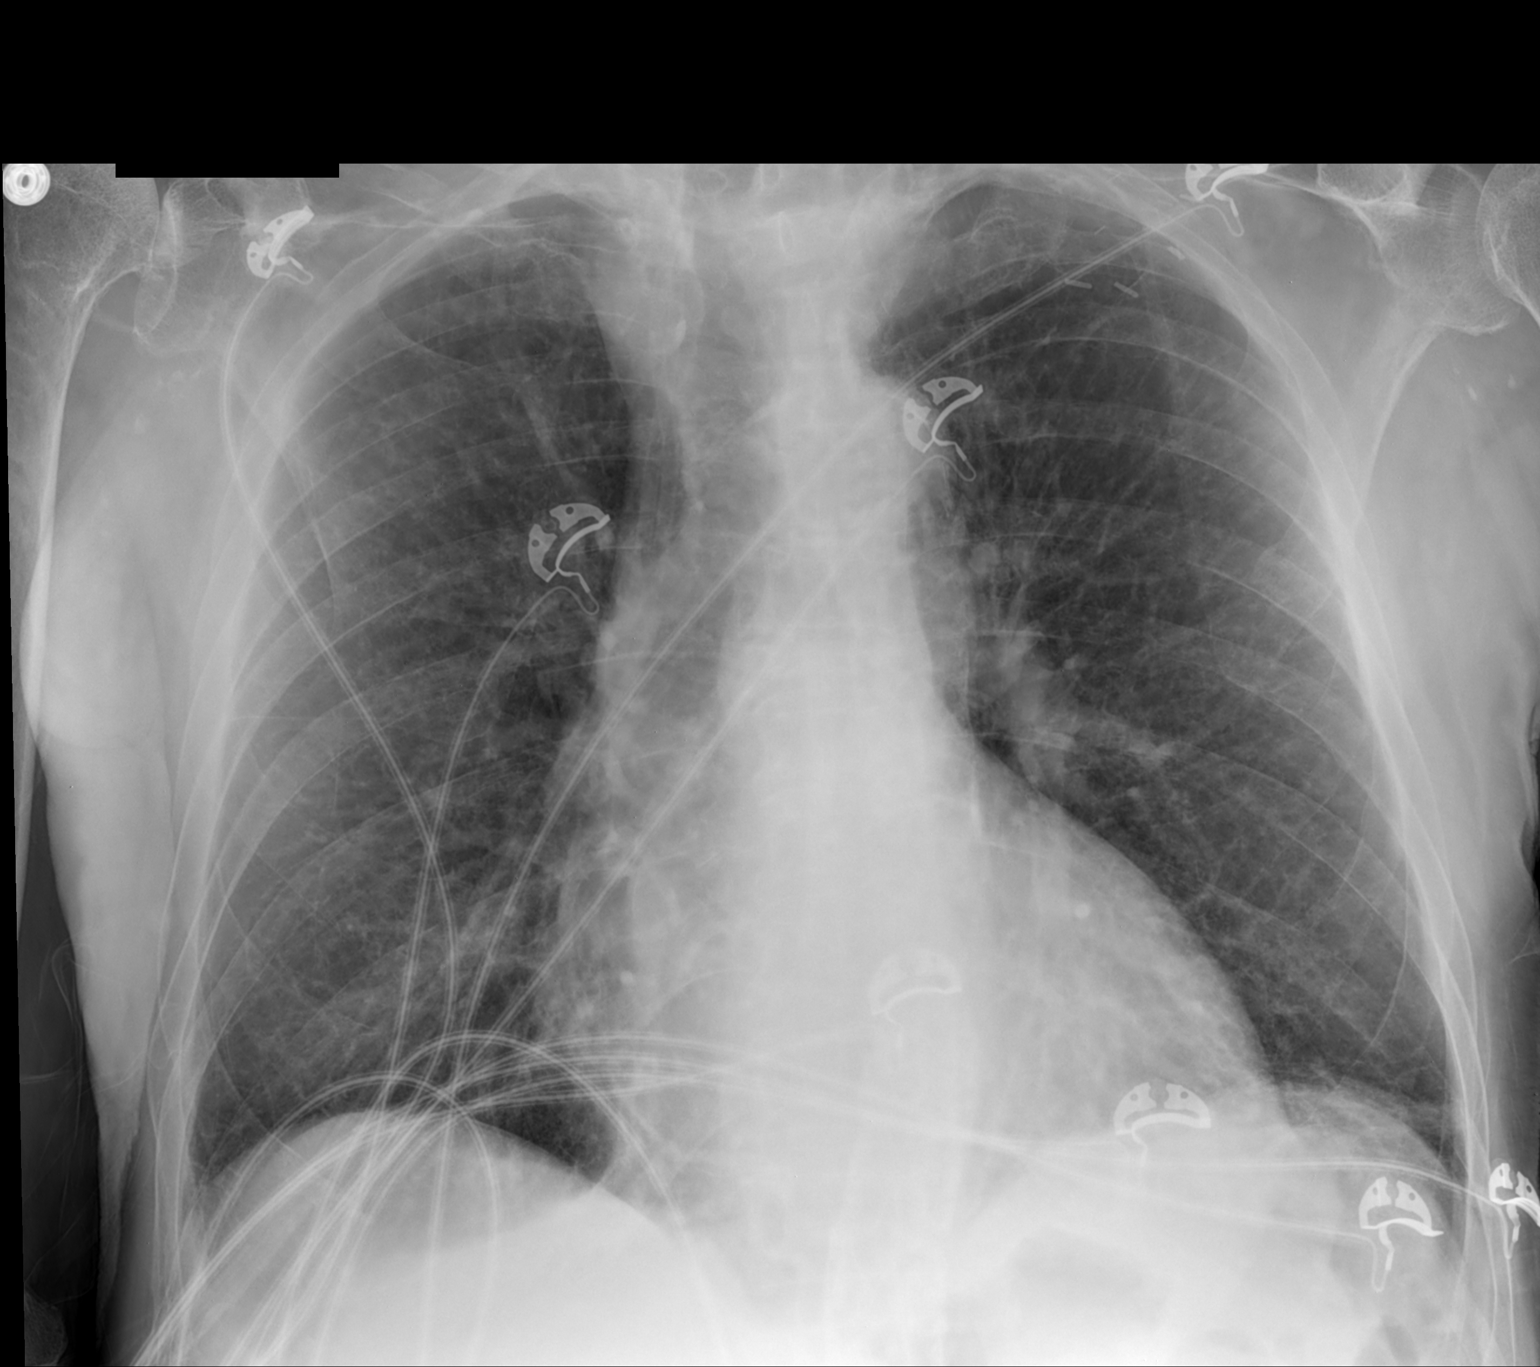

[1 of 1 positions shown; findings below may reference images not displayed]

FINDINGS: Hyperinflation of the lungs compatible with COPD.  Left
basilar density, favor atelectasis.  No confluent opacity on the
right.  Mild interstitial prominence throughout the lungs, stable.
No visible effusions or acute bony abnormality.
IMPRESSION: Stable COPD and chronic interstitial changes.

Left base density, likely atelectasis.

## 2015-04-17 NOTE — Telephone Encounter (Signed)
Error

## 2015-04-18 IMAGING — CR DG CHEST 2V
2 series · 2 of 2 positions shown · non-contrast
Comparison: 10/04/2012

CLINICAL DATA: Shortness of breath, productive cough

CHEST - 2 VIEW

[w chest pa]
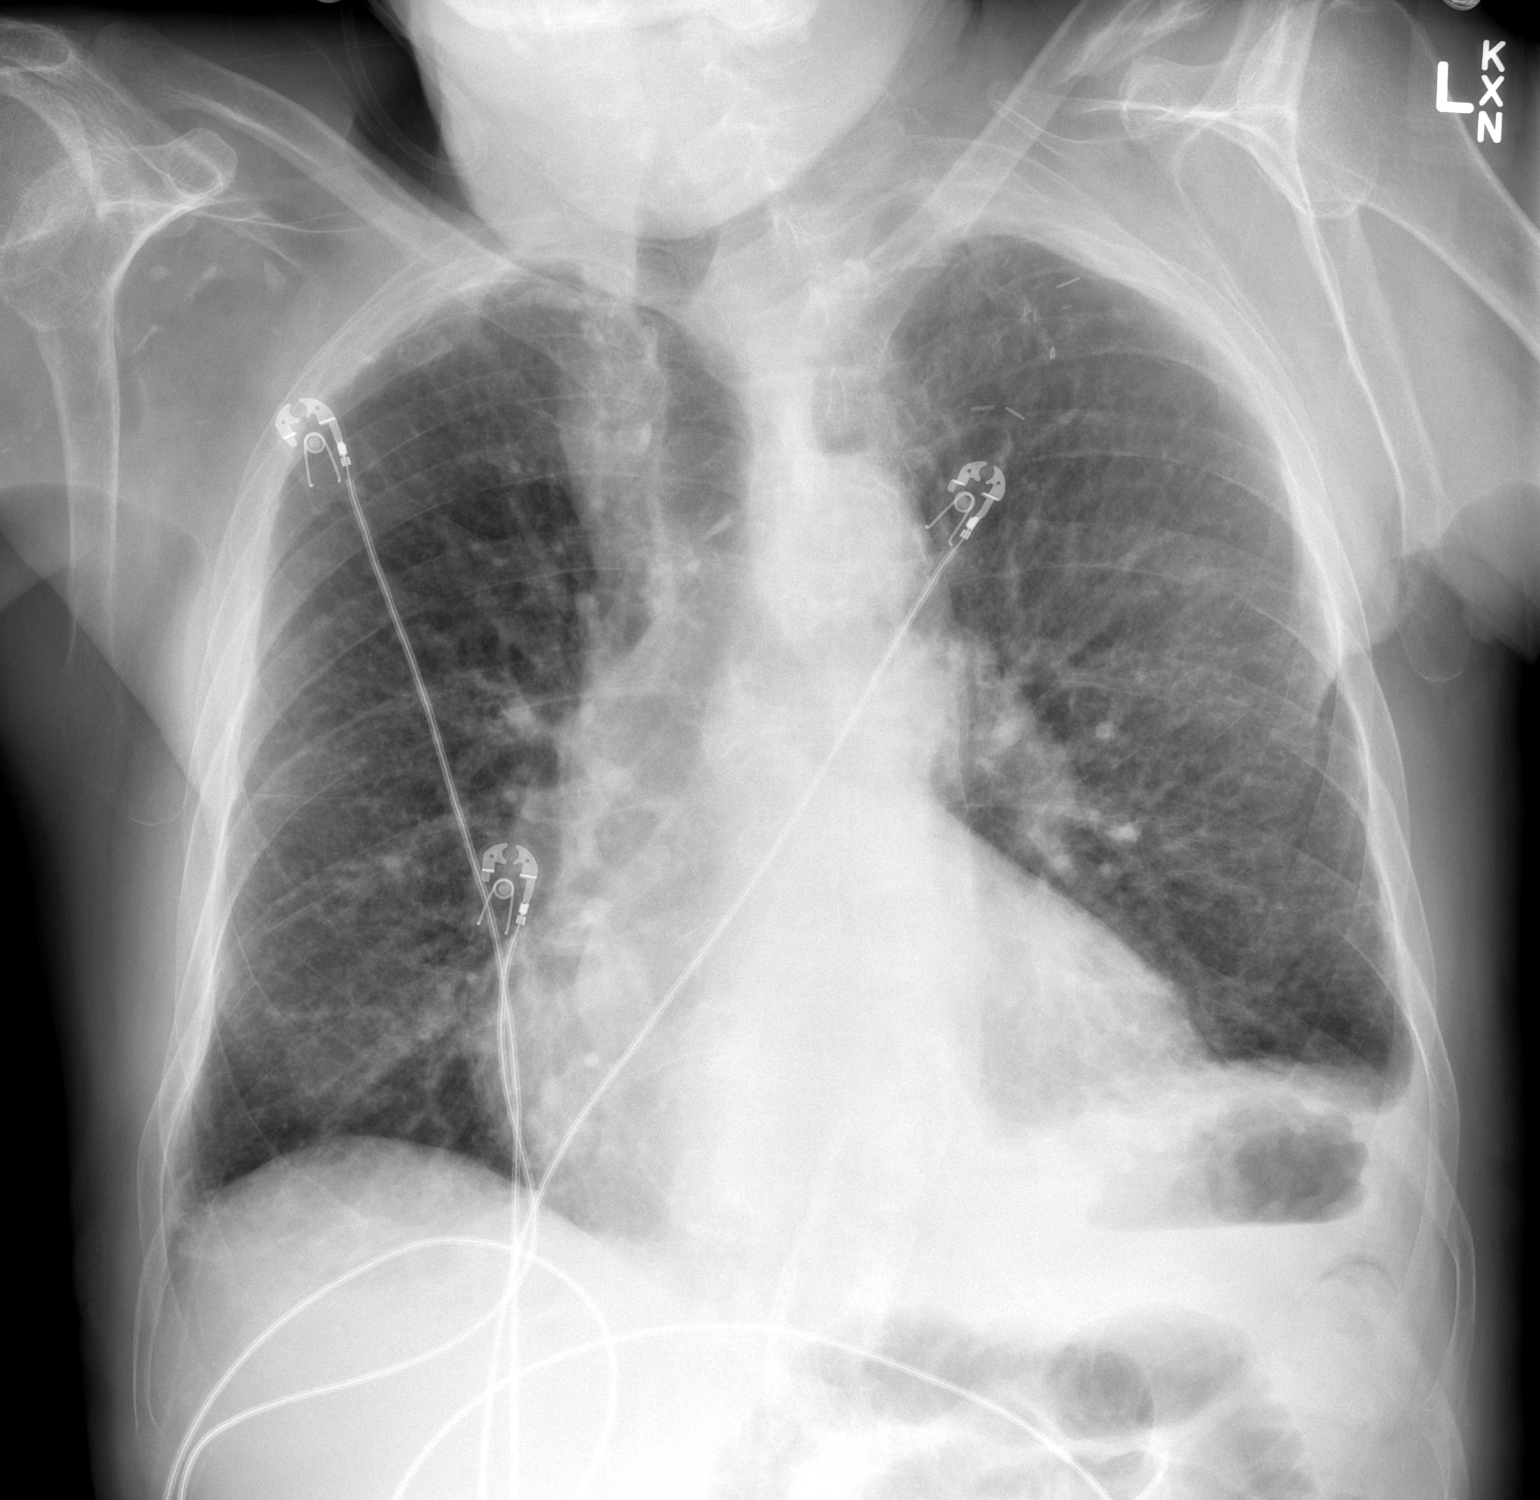

[w chest lat]
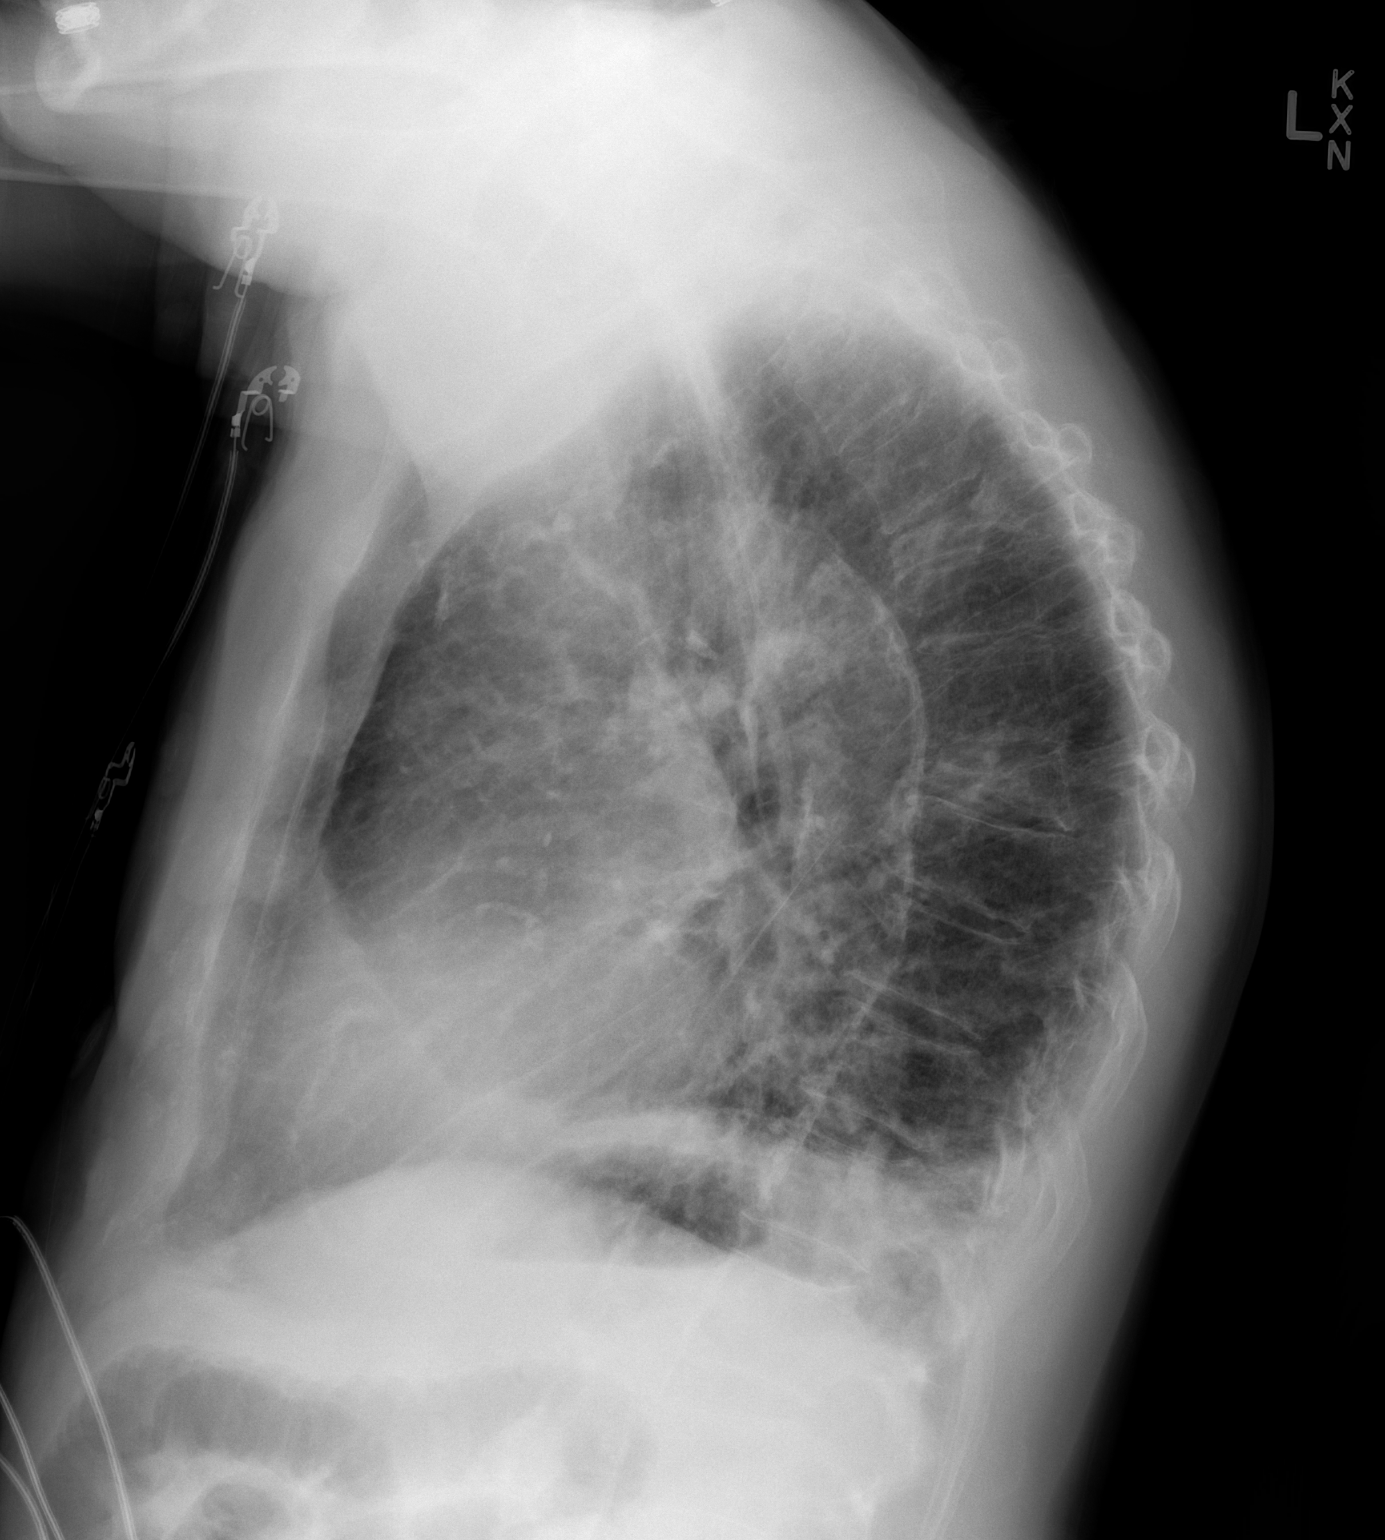

[2 of 2 positions shown; findings below may reference images not displayed]

FINDINGS: Left apical clips again noted.  The patient is rotated to
the right.  Right axillary presumed vascular calcifications noted.
Moderate enlargement cardiomediastinal silhouette is noted.  Trace
bilateral pleural effusions are increased.  A few new peripheral
Kerley B lines are identified.  Thoracic spine compression
deformities are again noted.
IMPRESSION: Cardiomegaly with increased pleural effusions and interstitial
Kerley B lines which may indicate developing interstitial pulmonary
edema.

## 2015-06-05 IMAGING — CR DG CHEST 2V
2 series · 2 of 2 positions shown · non-contrast
Comparison: 10/19/2012

CLINICAL DATA: Worsening shortness breath and cough.  Congestive
heart failure.  Prior myocardial infarction. Thyroid carcinoma.

CHEST - 2 VIEW

[w chest lat]
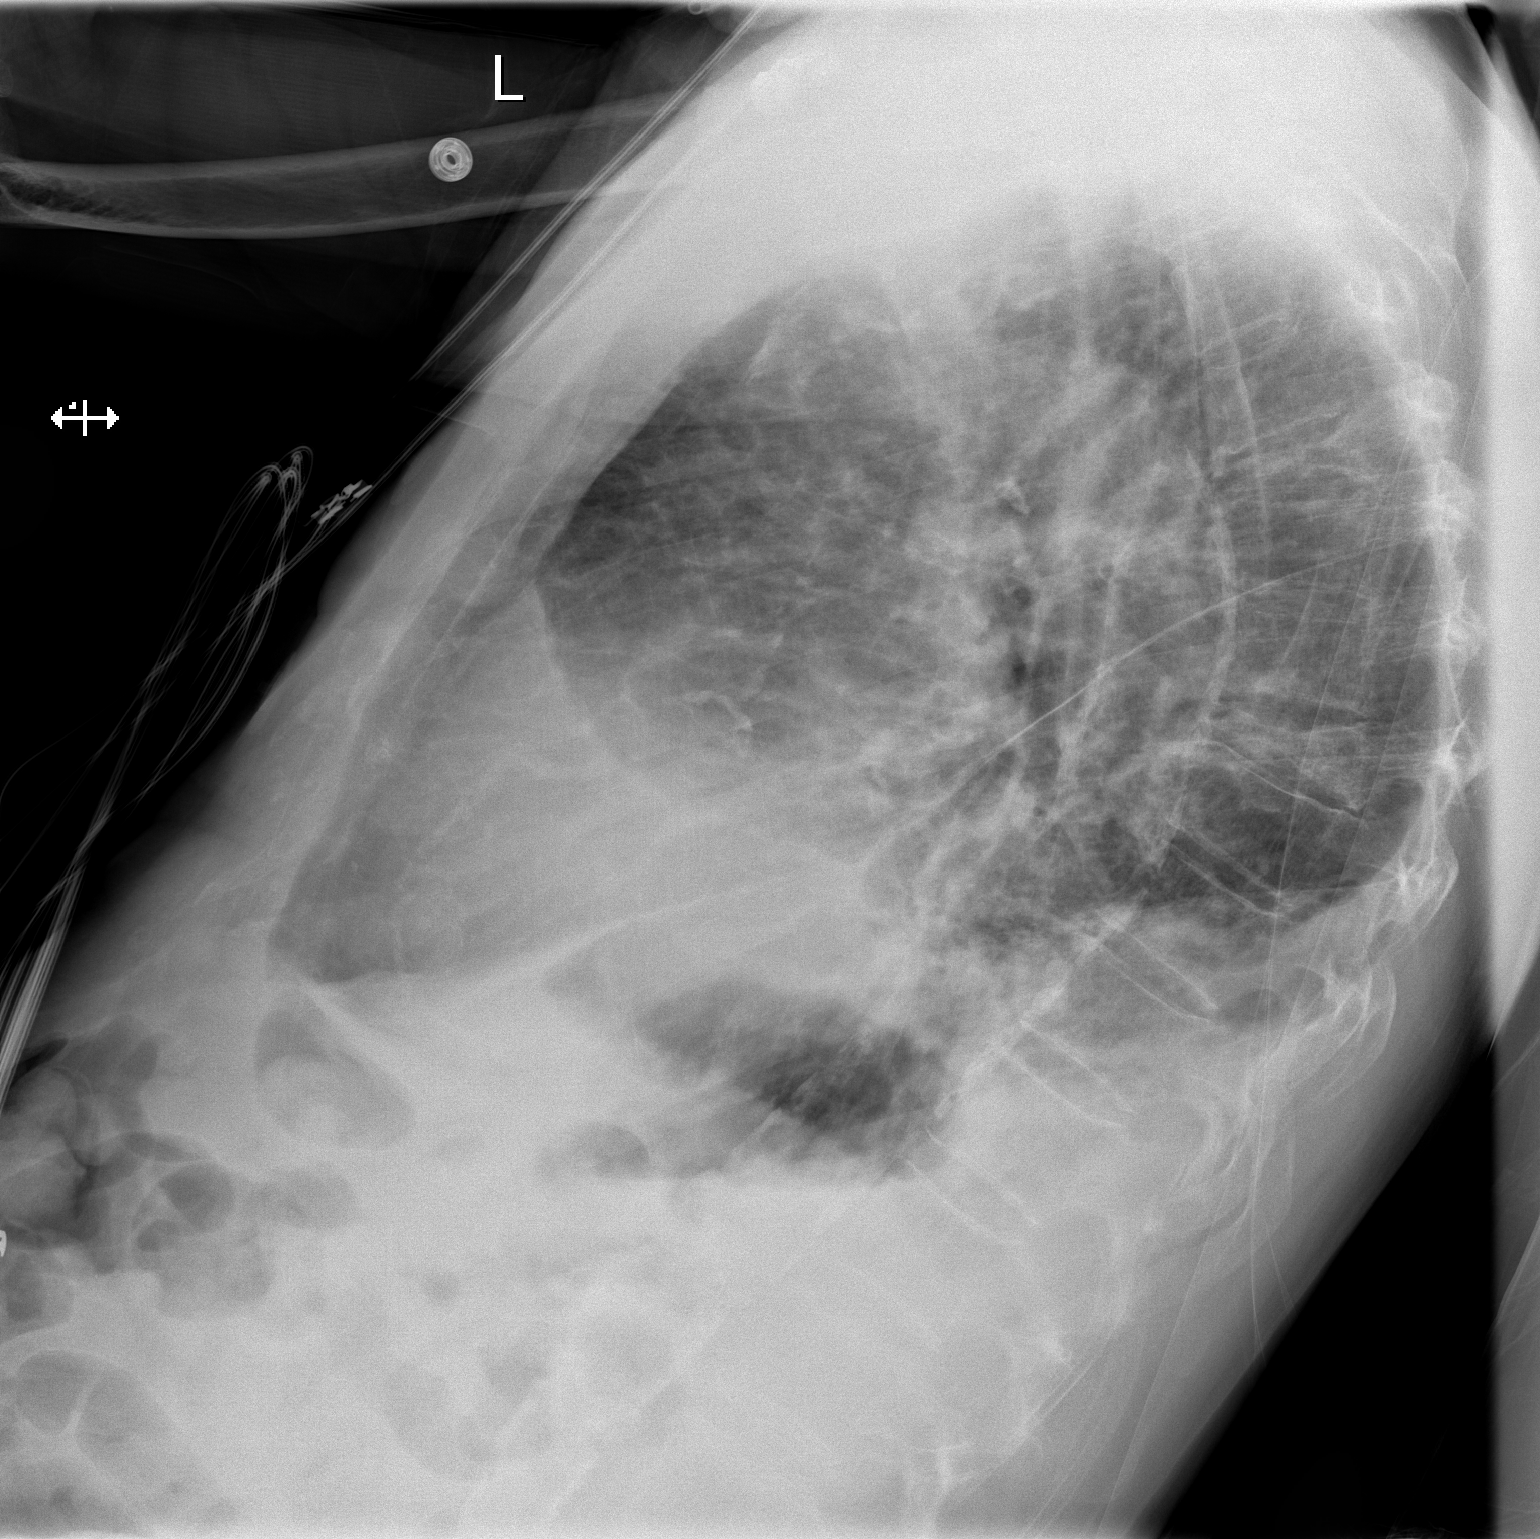

[x chest ap]
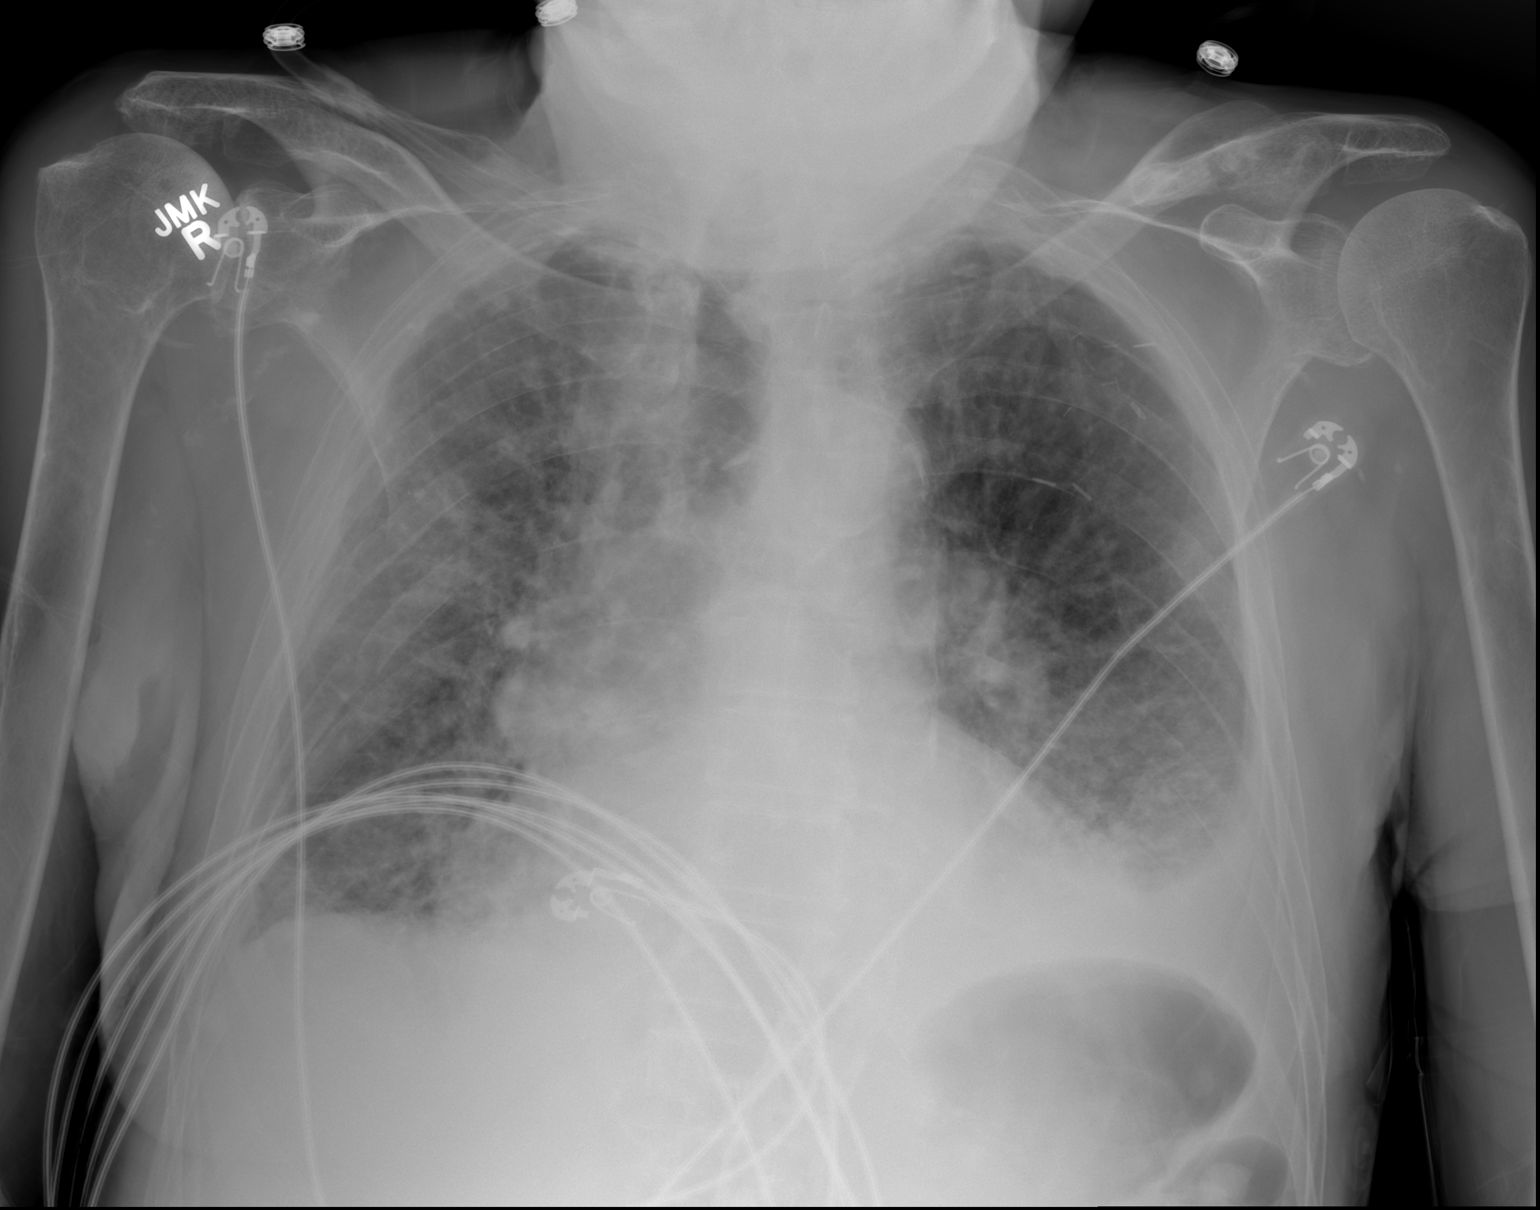

[2 of 2 positions shown; findings below may reference images not displayed]

FINDINGS: Moderate cardiomegaly again noted.  Increased diffuse
interstitial infiltrates are seen, consistent with diffuse
interstitial edema.  Small bilateral pleural effusions are seen,
left side greater than right, with left basilar atelectasis.
IMPRESSION: Congestive heart failure, with small pleural effusions and left
lower lobe atelectasis.

## 2015-06-07 IMAGING — CR DG CHEST 1V PORT
1 series · 1 of 1 positions shown · non-contrast
Comparison: 12/02/2012

CLINICAL DATA: Respiratory failure.

PORTABLE CHEST - 1 VIEW

[AP]
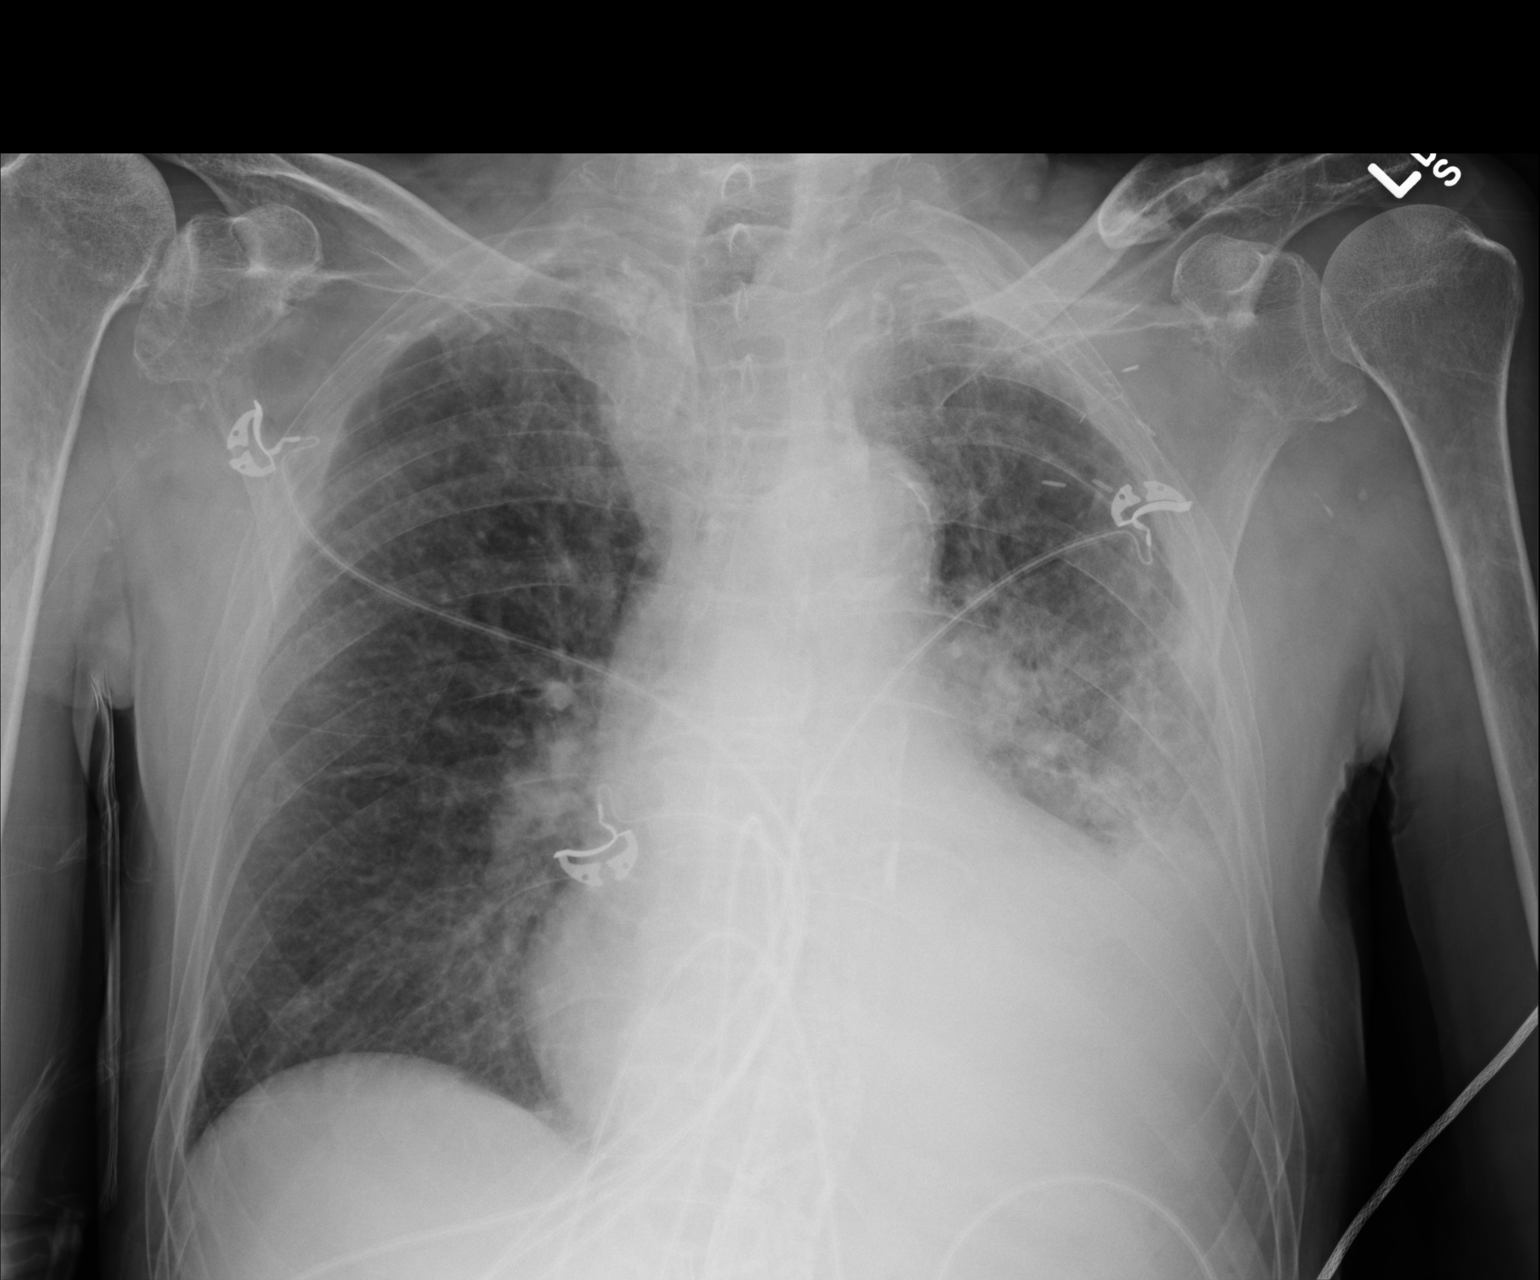

[1 of 1 positions shown; findings below may reference images not displayed]

FINDINGS: Increased densities in the left mid and lower lung region
may represent asymmetric edema and pleural fluid.  There is
improved aeration in the right lung.  The heart size appears to be
upper limits of normal.  The thoracic aorta is heavily calcified.
Chronic abnormality in the lateral left clavicle.  Elevated right
humeral head suggests rotator cuff disease.
IMPRESSION: Increased densities in the left mid and lower lung region probably
represent a combination of pleural fluid and atelectasis.  Cannot
exclude asymmetric edema.
